# Patient Record
Sex: Female | Born: 1960 | Race: White | Hispanic: No | Marital: Married | Smoking: Never smoker
Health system: Southern US, Community
[De-identification: ages and names within clinical notes are randomized; demographics above are authoritative.]

## PROBLEM LIST (undated history)

## (undated) DIAGNOSIS — Z9889 Other specified postprocedural states: Secondary | ICD-10-CM

## (undated) DIAGNOSIS — J90 Pleural effusion, not elsewhere classified: Secondary | ICD-10-CM

## (undated) DIAGNOSIS — R918 Other nonspecific abnormal finding of lung field: Secondary | ICD-10-CM

## (undated) DIAGNOSIS — N10 Acute tubulo-interstitial nephritis: Secondary | ICD-10-CM

## (undated) DIAGNOSIS — E66811 Obesity, class 1: Secondary | ICD-10-CM

## (undated) DIAGNOSIS — I10 Essential (primary) hypertension: Secondary | ICD-10-CM

## (undated) DIAGNOSIS — E669 Obesity, unspecified: Secondary | ICD-10-CM

## (undated) DIAGNOSIS — M1990 Unspecified osteoarthritis, unspecified site: Secondary | ICD-10-CM

## (undated) DIAGNOSIS — E119 Type 2 diabetes mellitus without complications: Secondary | ICD-10-CM

## (undated) DIAGNOSIS — N151 Renal and perinephric abscess: Secondary | ICD-10-CM

## (undated) HISTORY — PX: ABDOMINAL HYSTERECTOMY: SHX81

## (undated) HISTORY — PX: TUBAL LIGATION: SHX77

---

## 2020-07-31 ENCOUNTER — Other Ambulatory Visit: Payer: Self-pay

## 2020-07-31 ENCOUNTER — Inpatient Hospital Stay (HOSPITAL_COMMUNITY)
Admission: EM | Admit: 2020-07-31 | Discharge: 2020-08-19 | DRG: 871 | Disposition: A | Discharge status: Home Health | Payer: Self-pay | Attending: Internal Medicine | Admitting: Internal Medicine

## 2020-07-31 ENCOUNTER — Encounter (HOSPITAL_COMMUNITY): Payer: Self-pay | Admitting: Emergency Medicine

## 2020-07-31 ENCOUNTER — Emergency Department (HOSPITAL_COMMUNITY): Payer: Self-pay

## 2020-07-31 DIAGNOSIS — E871 Hypo-osmolality and hyponatremia: Secondary | ICD-10-CM

## 2020-07-31 DIAGNOSIS — Z90710 Acquired absence of both cervix and uterus: Secondary | ICD-10-CM

## 2020-07-31 DIAGNOSIS — E860 Dehydration: Secondary | ICD-10-CM | POA: Diagnosis present

## 2020-07-31 DIAGNOSIS — I712 Thoracic aortic aneurysm, without rupture: Secondary | ICD-10-CM | POA: Diagnosis present

## 2020-07-31 DIAGNOSIS — R109 Unspecified abdominal pain: Secondary | ICD-10-CM

## 2020-07-31 DIAGNOSIS — R7881 Bacteremia: Secondary | ICD-10-CM

## 2020-07-31 DIAGNOSIS — J9600 Acute respiratory failure, unspecified whether with hypoxia or hypercapnia: Secondary | ICD-10-CM

## 2020-07-31 DIAGNOSIS — J188 Other pneumonia, unspecified organism: Secondary | ICD-10-CM

## 2020-07-31 DIAGNOSIS — E1110 Type 2 diabetes mellitus with ketoacidosis without coma: Secondary | ICD-10-CM

## 2020-07-31 DIAGNOSIS — A4151 Sepsis due to Escherichia coli [E. coli]: Principal | ICD-10-CM | POA: Diagnosis present

## 2020-07-31 DIAGNOSIS — J90 Pleural effusion, not elsewhere classified: Secondary | ICD-10-CM | POA: Diagnosis present

## 2020-07-31 DIAGNOSIS — R918 Other nonspecific abnormal finding of lung field: Secondary | ICD-10-CM

## 2020-07-31 DIAGNOSIS — D638 Anemia in other chronic diseases classified elsewhere: Secondary | ICD-10-CM | POA: Diagnosis present

## 2020-07-31 DIAGNOSIS — J189 Pneumonia, unspecified organism: Secondary | ICD-10-CM

## 2020-07-31 DIAGNOSIS — D72829 Elevated white blood cell count, unspecified: Secondary | ICD-10-CM | POA: Diagnosis present

## 2020-07-31 DIAGNOSIS — N151 Renal and perinephric abscess: Secondary | ICD-10-CM

## 2020-07-31 DIAGNOSIS — K567 Ileus, unspecified: Secondary | ICD-10-CM

## 2020-07-31 DIAGNOSIS — R140 Abdominal distension (gaseous): Secondary | ICD-10-CM

## 2020-07-31 DIAGNOSIS — K6819 Other retroperitoneal abscess: Secondary | ICD-10-CM

## 2020-07-31 DIAGNOSIS — N179 Acute kidney failure, unspecified: Secondary | ICD-10-CM

## 2020-07-31 DIAGNOSIS — E875 Hyperkalemia: Secondary | ICD-10-CM | POA: Diagnosis not present

## 2020-07-31 DIAGNOSIS — B9620 Unspecified Escherichia coli [E. coli] as the cause of diseases classified elsewhere: Secondary | ICD-10-CM

## 2020-07-31 DIAGNOSIS — I10 Essential (primary) hypertension: Secondary | ICD-10-CM | POA: Diagnosis present

## 2020-07-31 DIAGNOSIS — E872 Acidosis: Secondary | ICD-10-CM | POA: Diagnosis present

## 2020-07-31 DIAGNOSIS — N738 Other specified female pelvic inflammatory diseases: Secondary | ICD-10-CM | POA: Diagnosis present

## 2020-07-31 DIAGNOSIS — L02211 Cutaneous abscess of abdominal wall: Secondary | ICD-10-CM | POA: Diagnosis present

## 2020-07-31 DIAGNOSIS — R410 Disorientation, unspecified: Secondary | ICD-10-CM | POA: Diagnosis not present

## 2020-07-31 DIAGNOSIS — G9341 Metabolic encephalopathy: Secondary | ICD-10-CM | POA: Diagnosis present

## 2020-07-31 DIAGNOSIS — N136 Pyonephrosis: Secondary | ICD-10-CM | POA: Diagnosis present

## 2020-07-31 DIAGNOSIS — Z6832 Body mass index (BMI) 32.0-32.9, adult: Secondary | ICD-10-CM

## 2020-07-31 DIAGNOSIS — R6521 Severe sepsis with septic shock: Secondary | ICD-10-CM | POA: Diagnosis present

## 2020-07-31 DIAGNOSIS — R0989 Other specified symptoms and signs involving the circulatory and respiratory systems: Secondary | ICD-10-CM

## 2020-07-31 DIAGNOSIS — I714 Abdominal aortic aneurysm, without rupture: Secondary | ICD-10-CM | POA: Diagnosis present

## 2020-07-31 DIAGNOSIS — E1165 Type 2 diabetes mellitus with hyperglycemia: Secondary | ICD-10-CM

## 2020-07-31 DIAGNOSIS — E876 Hypokalemia: Secondary | ICD-10-CM | POA: Diagnosis present

## 2020-07-31 DIAGNOSIS — K529 Noninfective gastroenteritis and colitis, unspecified: Secondary | ICD-10-CM | POA: Diagnosis present

## 2020-07-31 DIAGNOSIS — E669 Obesity, unspecified: Secondary | ICD-10-CM | POA: Diagnosis present

## 2020-07-31 DIAGNOSIS — N170 Acute kidney failure with tubular necrosis: Secondary | ICD-10-CM | POA: Diagnosis present

## 2020-07-31 DIAGNOSIS — Z20822 Contact with and (suspected) exposure to covid-19: Secondary | ICD-10-CM | POA: Diagnosis present

## 2020-07-31 DIAGNOSIS — I76 Septic arterial embolism: Secondary | ICD-10-CM | POA: Diagnosis present

## 2020-07-31 DIAGNOSIS — E1100 Type 2 diabetes mellitus with hyperosmolarity without nonketotic hyperglycemic-hyperosmolar coma (NKHHC): Secondary | ICD-10-CM | POA: Diagnosis not present

## 2020-07-31 DIAGNOSIS — N10 Acute tubulo-interstitial nephritis: Secondary | ICD-10-CM

## 2020-07-31 DIAGNOSIS — N12 Tubulo-interstitial nephritis, not specified as acute or chronic: Secondary | ICD-10-CM

## 2020-07-31 DIAGNOSIS — A419 Sepsis, unspecified organism: Secondary | ICD-10-CM | POA: Diagnosis present

## 2020-07-31 DIAGNOSIS — E43 Unspecified severe protein-calorie malnutrition: Secondary | ICD-10-CM | POA: Diagnosis present

## 2020-07-31 HISTORY — DX: Obesity, class 1: E66.811

## 2020-07-31 HISTORY — DX: Obesity, unspecified: E66.9

## 2020-07-31 HISTORY — DX: Essential (primary) hypertension: I10

## 2020-07-31 HISTORY — DX: Pneumonia, unspecified organism: J18.9

## 2020-07-31 LAB — CBC WITH DIFFERENTIAL/PLATELET
Abs Immature Granulocytes: 2.41 10*3/uL — ABNORMAL HIGH (ref 0.00–0.07)
Basophils Absolute: 0 10*3/uL (ref 0.0–0.1)
Basophils Relative: 0 %
Eosinophils Absolute: 0 10*3/uL (ref 0.0–0.5)
Eosinophils Relative: 0 %
HCT: 34.3 % — ABNORMAL LOW (ref 36.0–46.0)
Hemoglobin: 12.6 g/dL (ref 12.0–15.0)
Immature Granulocytes: 6 %
Lymphocytes Relative: 2 %
Lymphs Abs: 0.9 10*3/uL (ref 0.7–4.0)
MCH: 30.5 pg (ref 26.0–34.0)
MCHC: 36.7 g/dL — ABNORMAL HIGH (ref 30.0–36.0)
MCV: 83.1 fL (ref 80.0–100.0)
Monocytes Absolute: 0.5 10*3/uL (ref 0.1–1.0)
Monocytes Relative: 1 %
Neutro Abs: 40.1 10*3/uL — ABNORMAL HIGH (ref 1.7–7.7)
Neutrophils Relative %: 91 %
Platelets: 225 10*3/uL (ref 150–400)
RBC: 4.13 MIL/uL (ref 3.87–5.11)
RDW: 12.9 % (ref 11.5–15.5)
WBC Morphology: INCREASED
WBC: 44 10*3/uL — ABNORMAL HIGH (ref 4.0–10.5)
nRBC: 0 % (ref 0.0–0.2)

## 2020-07-31 LAB — COMPREHENSIVE METABOLIC PANEL
ALT: 16 U/L (ref 0–44)
AST: 14 U/L — ABNORMAL LOW (ref 15–41)
Albumin: 2 g/dL — ABNORMAL LOW (ref 3.5–5.0)
Alkaline Phosphatase: 147 U/L — ABNORMAL HIGH (ref 38–126)
BUN: 112 mg/dL — ABNORMAL HIGH (ref 6–20)
CO2: 14 mmol/L — ABNORMAL LOW (ref 22–32)
Calcium: 7.2 mg/dL — ABNORMAL LOW (ref 8.9–10.3)
Chloride: <65 mmol/L — CL (ref 98–111)
Creatinine, Ser: 4.19 mg/dL — ABNORMAL HIGH (ref 0.44–1.00)
GFR, Estimated: 12 mL/min — ABNORMAL LOW (ref >60)
Glucose, Bld: 706 mg/dL (ref 70–99)
Potassium: 3.3 mmol/L — ABNORMAL LOW (ref 3.5–5.1)
Sodium: <102 mmol/L — CL (ref 135–145)
Total Bilirubin: 1.3 mg/dL — ABNORMAL HIGH (ref 0.3–1.2)
Total Protein: 5.4 g/dL — ABNORMAL LOW (ref 6.5–8.1)

## 2020-07-31 LAB — BASIC METABOLIC PANEL
BUN: 106 mg/dL — ABNORMAL HIGH (ref 6–20)
CO2: 12 mmol/L — ABNORMAL LOW (ref 22–32)
Calcium: 7.2 mg/dL — ABNORMAL LOW (ref 8.9–10.3)
Chloride: 69 mmol/L — ABNORMAL LOW (ref 98–111)
Creatinine, Ser: 3.98 mg/dL — ABNORMAL HIGH (ref 0.44–1.00)
GFR, Estimated: 12 mL/min — ABNORMAL LOW (ref >60)
Glucose, Bld: 624 mg/dL (ref 70–99)
Potassium: 3.1 mmol/L — ABNORMAL LOW (ref 3.5–5.1)
Sodium: <102 mmol/L — CL (ref 135–145)

## 2020-07-31 LAB — RESP PANEL BY RT-PCR (FLU A&B, COVID) ARPGX2
Influenza A by PCR: NEGATIVE
Influenza B by PCR: NEGATIVE
SARS Coronavirus 2 by RT PCR: NEGATIVE

## 2020-07-31 LAB — LACTIC ACID, PLASMA
Lactic Acid, Venous: 2 mmol/L (ref 0.5–1.9)
Lactic Acid, Venous: 1.9 mmol/L (ref 0.5–1.9)

## 2020-07-31 LAB — AMMONIA: Ammonia: 32 umol/L (ref 9–35)

## 2020-07-31 LAB — CBG MONITORING, ED
Glucose-Capillary: >600 mg/dL (ref 70–99)
Glucose-Capillary: >600 mg/dL (ref 70–99)
Glucose-Capillary: >600 mg/dL (ref 70–99)
Glucose-Capillary: 560 mg/dL (ref 70–99)

## 2020-07-31 LAB — LIPASE, BLOOD: Lipase: 51 U/L (ref 11–51)

## 2020-07-31 LAB — BETA-HYDROXYBUTYRIC ACID: Beta-Hydroxybutyric Acid: 0.38 mmol/L — ABNORMAL HIGH (ref 0.05–0.27)

## 2020-07-31 MED ORDER — ONDANSETRON HCL 4 MG PO TABS: 4.0000 mg | ORAL_TABLET | Freq: Four times a day (QID) | ORAL | Status: DC | PRN

## 2020-07-31 MED ORDER — DEXTROSE IN LACTATED RINGERS 5 % IV SOLN: INTRAVENOUS | Status: DC

## 2020-07-31 MED ORDER — POTASSIUM CHLORIDE IN NACL 40-0.9 MEQ/L-% IV SOLN
INTRAVENOUS | Status: DC
  Filled 2020-07-31 (×2): qty 1000

## 2020-07-31 MED ORDER — LACTATED RINGERS IV BOLUS (SEPSIS)
500.0000 mL | Freq: Once | INTRAVENOUS | Status: AC
  Administered 2020-07-31: 21:00:00 500 mL via INTRAVENOUS

## 2020-07-31 MED ORDER — LACTATED RINGERS IV BOLUS: 1000.0000 mL | Freq: Once | INTRAVENOUS | Status: DC

## 2020-07-31 MED ORDER — LACTATED RINGERS IV BOLUS (SEPSIS): 1000.0000 mL | Freq: Once | INTRAVENOUS | Status: DC

## 2020-07-31 MED ORDER — POTASSIUM CHLORIDE 10 MEQ/100ML IV SOLN: 10.0000 meq | INTRAVENOUS | Status: DC

## 2020-07-31 MED ORDER — INSULIN REGULAR(HUMAN) IN NACL 100-0.9 UT/100ML-% IV SOLN
INTRAVENOUS | Status: DC
  Administered 2020-07-31: 22:00:00 10 [IU]/h via INTRAVENOUS
  Filled 2020-07-31: qty 100

## 2020-07-31 MED ORDER — ALBUTEROL SULFATE (2.5 MG/3ML) 0.083% IN NEBU: 2.5000 mg | INHALATION_SOLUTION | RESPIRATORY_TRACT | Status: DC | PRN

## 2020-07-31 MED ORDER — ACETAMINOPHEN 650 MG RE SUPP: 650.0000 mg | Freq: Four times a day (QID) | RECTAL | Status: DC | PRN

## 2020-07-31 MED ORDER — SODIUM CHLORIDE 0.9 % IV BOLUS: 20.0000 mL/kg | Freq: Once | INTRAVENOUS | Status: DC

## 2020-07-31 MED ORDER — DEXTROSE 50 % IV SOLN: 0.0000 mL | INTRAVENOUS | Status: DC | PRN

## 2020-07-31 MED ORDER — LACTATED RINGERS IV SOLN
INTRAVENOUS | Status: DC
Start: 2020-07-31 — End: 2020-07-31

## 2020-07-31 MED ORDER — LACTATED RINGERS IV BOLUS (SEPSIS)
1000.0000 mL | Freq: Once | INTRAVENOUS | Status: AC
  Administered 2020-07-31: 20:00:00 1000 mL via INTRAVENOUS

## 2020-07-31 MED ORDER — ONDANSETRON HCL 4 MG/2ML IJ SOLN: 4.0000 mg | Freq: Four times a day (QID) | INTRAMUSCULAR | Status: DC | PRN

## 2020-07-31 MED ORDER — SODIUM CHLORIDE 0.9 % IV SOLN
500.0000 mg | INTRAVENOUS | Status: DC
  Administered 2020-07-31: 21:00:00 500 mg via INTRAVENOUS
  Filled 2020-07-31: qty 500

## 2020-07-31 MED ORDER — SODIUM CHLORIDE 0.9 % IV SOLN
1000.0000 mL | Freq: Once | INTRAVENOUS | Status: AC
  Administered 2020-07-31: 18:00:00 1000 mL via INTRAVENOUS

## 2020-07-31 MED ORDER — SODIUM CHLORIDE 0.9 % IV SOLN
2.0000 g | INTRAVENOUS | Status: DC
  Administered 2020-07-31: 20:00:00 2 g via INTRAVENOUS
  Filled 2020-07-31: qty 20

## 2020-07-31 MED ORDER — MORPHINE SULFATE (PF) 4 MG/ML IV SOLN
4.0000 mg | Freq: Once | INTRAVENOUS | Status: AC
  Administered 2020-07-31: 18:00:00 4 mg via INTRAVENOUS
  Filled 2020-07-31: qty 1

## 2020-07-31 MED ORDER — ENOXAPARIN SODIUM 30 MG/0.3ML ~~LOC~~ SOLN
30.0000 mg | SUBCUTANEOUS | Status: DC
  Administered 2020-08-01 – 2020-08-03 (×3): 30 mg via SUBCUTANEOUS
  Filled 2020-07-31 (×3): qty 0.3

## 2020-07-31 MED ORDER — SODIUM CHLORIDE 0.9 % IV SOLN: INTRAVENOUS | Status: DC

## 2020-07-31 MED ORDER — ONDANSETRON HCL 4 MG/2ML IJ SOLN
4.0000 mg | Freq: Once | INTRAMUSCULAR | Status: AC
  Administered 2020-07-31: 18:00:00 4 mg via INTRAVENOUS
  Filled 2020-07-31: qty 2

## 2020-07-31 MED ORDER — ACETAMINOPHEN 325 MG PO TABS: 650.0000 mg | ORAL_TABLET | Freq: Four times a day (QID) | ORAL | Status: DC | PRN

## 2020-07-31 MED ORDER — ENOXAPARIN SODIUM 30 MG/0.3ML ~~LOC~~ SOLN: 30.0000 mg | SUBCUTANEOUS | Status: DC

## 2020-07-31 MED ORDER — INSULIN ASPART 100 UNIT/ML ~~LOC~~ SOLN
5.0000 [IU] | Freq: Once | SUBCUTANEOUS | Status: AC
  Administered 2020-07-31: 22:00:00 5 [IU] via SUBCUTANEOUS
  Filled 2020-07-31: qty 1

## 2020-07-31 NOTE — Sepsis Progress Note (Signed)
Following for code sepsis monitoring 

## 2020-07-31 NOTE — ED Provider Notes (Signed)
[facility] EMERGENCY DEPARTMENT Provider Note   CSN: [id number] Arrival date & time: 07/31/20  1636     History Chief Complaint  Patient presents with  . Abdominal Pain    Tammie [name] is a 59 y.o. female.  The history is provided by the patient and the spouse. No language interpreter was used.  Abdominal Pain    59 year old female significant history of hypertension accompanied by her husband to the ED for evaluation of abdominal pain.  History is mostly obtained through husband who is at bedside.  Per husband, patient have not been feeling well for the past week however for the past 2 to 3 days she has been complaining of abdominal pain, along with nausea.  He also noticed that she is becoming more confused than her baseline within the past few days not able to answer question appropriately.  This is new for her.  She has not been vaccinated for Covid. She has been coughing but no congestion and no SOB.   She haven't been eating much.  No vomiting or diarrhea.  No report of burning urination but she did endorse increased urinary frequency.  No recent medication changes.  No significant history of tobacco or alcohol abuse.  No recent sick contact.  Past Medical History:  Diagnosis Date  . Hypertension     There are no problems to display for this patient.   Past Surgical History:  Procedure Laterality Date  . ABDOMINAL HYSTERECTOMY       OB History   No obstetric history on file.     History reviewed. No pertinent family history.  Social History   Tobacco Use  . Smoking status: Never Smoker  . Smokeless tobacco: Never Used  Vaping Use  . Vaping Use: Never used  Substance Use Topics  . Alcohol use: Not Currently  . Drug use: Not Currently    Home Medications Prior to Admission medications   Not on File    Allergies    Codeine  Review of Systems   Review of Systems  Unable to perform ROS: Mental status change  Gastrointestinal: Positive for abdominal  pain.    Physical Exam Updated Vital Signs BP 133/70 (BP Location: Right Arm)   Pulse 88   Temp 97.9 F (36.6 C) (Oral)   Resp 18   Ht 5\' 2"  (1.575 m)   Wt 79.4 kg   SpO2 98%   BMI 32.01 kg/m   Physical Exam Vitals and nursing note reviewed.  Constitutional:      Appearance: She is well-developed and well-nourished.     Comments: Ill-appearing female appears altered.  HENT:     Head: Atraumatic.     Mouth/Throat:     Comments: Mouth is dry Eyes:     Conjunctiva/sclera: Conjunctivae normal.  Cardiovascular:     Rate and Rhythm: Normal rate and regular rhythm.     Heart sounds: Normal heart sounds.  Pulmonary:     Effort: Pulmonary effort is normal.     Breath sounds: Normal breath sounds. No wheezing, rhonchi or rales.  Abdominal:     General: There is distension.     Tenderness: There is generalized abdominal tenderness.     Hernia: No hernia is present.  Musculoskeletal:     Cervical back: Neck supple.  Skin:    Findings: No rash.     Comments: Mottled skin appearance to her lower extremities bilaterally and the skin is cool to the touch.  Neurological:  Mental Status: She is alert. She is disoriented.     GCS: GCS eye subscore is 4. GCS verbal subscore is 4. GCS motor subscore is 6.     Cranial Nerves: Cranial nerves are intact.     Comments: No asterixis  Psychiatric:        Mood and Affect: Mood and affect normal.     ED Results / Procedures / Treatments   Labs (all labs ordered are listed, but only abnormal results are displayed) Labs Reviewed  CBC WITH DIFFERENTIAL/PLATELET - Abnormal; Notable for the following components:      Result Value   WBC 44.0 (*)    HCT 34.3 (*)    MCHC 36.7 (*)    Neutro Abs 40.1 (*)    Abs Immature Granulocytes 2.41 (*)    All other components within normal limits  LACTIC ACID, PLASMA - Abnormal; Notable for the following components:   Lactic Acid, Venous 2.0 (*)    All other components within normal limits   COMPREHENSIVE METABOLIC PANEL - Abnormal; Notable for the following components:   Sodium <102 (*)    Potassium 3.3 (*)    Chloride <65 (*)    CO2 14 (*)    Glucose, Bld 706 (*)    BUN 112 (*)    Creatinine, Ser 4.19 (*)    Calcium 7.2 (*)    Total Protein 5.4 (*)    Albumin 2.0 (*)    AST 14 (*)    Alkaline Phosphatase 147 (*)    Total Bilirubin 1.3 (*)    GFR, Estimated 12 (*)    All other components within normal limits  CBG MONITORING, ED - Abnormal; Notable for the following components:   Glucose-Capillary >600 (*)    All other components within normal limits  CULTURE, BLOOD (SINGLE)  RESP PANEL BY RT-PCR (FLU A&B, COVID) ARPGX2  CULTURE, BLOOD (SINGLE)  AMMONIA  LACTIC ACID, PLASMA  LIPASE, BLOOD  URINALYSIS, ROUTINE W REFLEX MICROSCOPIC  BETA-HYDROXYBUTYRIC ACID  BASIC METABOLIC PANEL  BASIC METABOLIC PANEL  BASIC METABOLIC PANEL  BASIC METABOLIC PANEL  OSMOLALITY  POC URINE PREG, ED  CBG MONITORING, ED    EKG None  Radiology DG Chest Portable 1 View  Result Date: 07/31/2020 CLINICAL DATA:  Delirium EXAM: PORTABLE CHEST 1 VIEW COMPARISON:  None. FINDINGS: The heart size and mediastinal contours are within normal limits. Elevation of left hemidiaphragm Bilateral patchy airspace opacities. No pulmonary edema. No definite pleural effusion. No pneumothorax. No acute osseous abnormality. IMPRESSION: Multifocal airspace opacities that could represent infection or inflammation. COVID-19 infection not excluded. Followup PA and lateral chest X-ray is recommended in 3-4 weeks following therapy to ensure resolution and exclude underlying malignancy. Electronically Signed   By: Tish Frederickson M.D.   On: 07/31/2020 17:46    Procedures .Critical Care Performed by: Fayrene Helper, PA-C Authorized by: Fayrene Helper, PA-C   Critical care provider statement:    Critical care time (minutes):  60   Critical care was time spent personally by me on the following activities:   Discussions with consultants, evaluation of patient's response to treatment, examination of patient, ordering and performing treatments and interventions, ordering and review of laboratory studies, ordering and review of radiographic studies, pulse oximetry, re-evaluation of patient's condition, obtaining history from patient or surrogate and review of old charts   (including critical care time)  Medications Ordered in ED Medications  lactated ringers infusion ( Intravenous New Bag/Given 07/31/20 2028)  cefTRIAXone (ROCEPHIN) 2 g in  sodium chloride 0.9 % 100 mL IVPB (0 g Intravenous Stopped 07/31/20 2038)  azithromycin (ZITHROMAX) 500 mg in sodium chloride 0.9 % 250 mL IVPB (500 mg Intravenous New Bag/Given 07/31/20 2049)  insulin aspart (novoLOG) injection 5 Units (has no administration in time range)  insulin regular, human (MYXREDLIN) 100 units/ 100 mL infusion (has no administration in time range)  dextrose 5 % in lactated ringers infusion (has no administration in time range)  dextrose 50 % solution 0-50 mL (has no administration in time range)  sodium chloride 0.9 % bolus 1,588 mL (has no administration in time range)  potassium chloride 10 mEq in 100 mL IVPB (has no administration in time range)  0.9 %  sodium chloride infusion (has no administration in time range)  0.9 % NaCl with KCl 40 mEq / L  infusion (has no administration in time range)  0.9 %  sodium chloride infusion (1,000 mLs Intravenous New Bag/Given 07/31/20 1802)  morphine 4 MG/ML injection 4 mg (4 mg Intravenous Given 07/31/20 1803)  ondansetron (ZOFRAN) injection 4 mg (4 mg Intravenous Given 07/31/20 1803)  lactated ringers bolus 1,000 mL (1,000 mLs Intravenous New Bag/Given 07/31/20 2029)    And  lactated ringers bolus 500 mL (500 mLs Intravenous New Bag/Given 07/31/20 2030)    ED Course  I have reviewed the triage vital signs and the nursing notes.  Pertinent labs & imaging results that were available during my  care of the patient were reviewed by me and considered in my medical decision making (see chart for details).    MDM Rules/Calculators/A&P                          BP (!) 133/100   Pulse 97   Temp 98.2 F (36.8 C) (Rectal)   Resp 18   Ht 5\' 2"  (1.575 m)   Wt 79.4 kg   SpO2 99%   BMI 32.01 kg/m   Final Clinical Impression(s) / ED Diagnoses Final diagnoses:  Multifocal pneumonia  Hyperosmolar hyperglycemic state (HHS) (HCC)  Hyponatremia  Acute renal failure, unspecified acute renal failure type (HCC)  Delirium    Rx / DC Orders ED Discharge Orders    None     5:30 PM Patient brought here for generalized weakness, altered mental status as well as abdominal pain.  She is having difficulty answering simple questions and appears to be confused.  She has a distended abdomen that is tender to palpation.  She is afebrile vital signs stable no hypoxia.  Work-up initiated.`  7:47 PM Chest x-ray shows multifocal airspace opacity that could represent infection or inflammation.  COVID-19 infection is not excluded.  Her Covid test did came back negative.  Patient however has an elevated white count of 44 with mostly absolute neutrophil as well as an elevated lactic acid of 2.0.  Normal ammonia level.  Code sepsis initiated, will initiate antibiotic which includes Rocephin and Zithromax.  Will consult for admission.  Abdominal pelvis CT scan is currently pending as well as a CMP.  8:45 PM CMP came back remarkably abnormal with a sodium of less than 102, potassium of 3.3, chloride less than 65, bicarb of 14, CBG of 706, BUN 112, creatinine 4.19, calcium of 7.2, and a GFR of 12.  Total bili is 1.3.  These labs are remarkably abnormal.  Patient without history of diabetes.  Is currently receiving fluid resuscitation at 30 mL/kg.  Patient was given antibiotics, will consult for admission.  Care discussed with Dr. Estell Harpin.   9:19 PM Attempted ultrasound IV access without success.  Initially  patient was giving lactic ringer IV fluid but with hyponatremia, will switch over to normal saline instead.  Patient exhibits hyperglycemia concerning for HHS with associated delirium.  Glucose stabilizer protocol initiated.  Aggressive fluid resuscitation.  Sepsis reassessment.  [facility] hospitalist, Dr. Robb Matar was consulted and will admit patient for further care.  Tammie [name] was evaluated in Emergency Department on 07/31/2020 for the symptoms described in the history of present illness. She was evaluated in the context of the global COVID-19 pandemic, which necessitated consideration that the patient might be at risk for infection with the SARS-CoV-2 virus that causes COVID-19. Institutional protocols and algorithms that pertain to the evaluation of patients at risk for COVID-19 are in a state of rapid change based on information released by regulatory bodies including the CDC and federal and state organizations. These policies and algorithms were followed during the patient's care in the ED.    Fayrene Helper, PA-C 07/31/20 2138    Bethann Berkshire, MD 08/01/20 [phone]

## 2020-07-31 NOTE — ED Notes (Signed)
Date and time results received: 12/23/212042 (use smartphrase ".now" to insert current time)  Test: Sodium Critical Value: <102  Test: Chloride Critical Value: <65  Test: Glucose Critical Value: 706  Name of Provider Notified: Estell Harpin, MD  Orders Received? Or Actions Taken?:

## 2020-07-31 NOTE — ED Notes (Signed)
Date and time results received: 07/31/20 2249  Test: Na Critical Value: <102  Test: BGL Critical Value: 624  Name of Provider Notified: Dr. Robb Matar  Orders Received? Or Actions Taken?:

## 2020-07-31 NOTE — Sepsis Progress Note (Signed)
Will close sepsis monitoring. Bundle complete. Follow as ICU patient.

## 2020-07-31 NOTE — ED Notes (Addendum)
Called AC for NS w/ KCL 

## 2020-07-31 NOTE — ED Triage Notes (Signed)
Pt c/o abdominal pain since Wednesday. Pt seems somewhat altered and is slow to answer questions.  She is accompanied by her husband for history.

## 2020-08-01 ENCOUNTER — Inpatient Hospital Stay (HOSPITAL_COMMUNITY): Payer: Self-pay

## 2020-08-01 ENCOUNTER — Encounter (HOSPITAL_COMMUNITY): Payer: Self-pay | Admitting: Internal Medicine

## 2020-08-01 DIAGNOSIS — R140 Abdominal distension (gaseous): Secondary | ICD-10-CM

## 2020-08-01 DIAGNOSIS — E43 Unspecified severe protein-calorie malnutrition: Secondary | ICD-10-CM | POA: Diagnosis present

## 2020-08-01 DIAGNOSIS — E876 Hypokalemia: Secondary | ICD-10-CM | POA: Diagnosis present

## 2020-08-01 DIAGNOSIS — I10 Essential (primary) hypertension: Secondary | ICD-10-CM | POA: Diagnosis present

## 2020-08-01 DIAGNOSIS — N151 Renal and perinephric abscess: Secondary | ICD-10-CM | POA: Diagnosis not present

## 2020-08-01 DIAGNOSIS — E871 Hypo-osmolality and hyponatremia: Secondary | ICD-10-CM | POA: Diagnosis present

## 2020-08-01 DIAGNOSIS — J439 Emphysema, unspecified: Secondary | ICD-10-CM | POA: Diagnosis not present

## 2020-08-01 DIAGNOSIS — N132 Hydronephrosis with renal and ureteral calculous obstruction: Secondary | ICD-10-CM | POA: Diagnosis not present

## 2020-08-01 DIAGNOSIS — I712 Thoracic aortic aneurysm, without rupture: Secondary | ICD-10-CM | POA: Diagnosis not present

## 2020-08-01 DIAGNOSIS — E66811 Obesity, class 1: Secondary | ICD-10-CM | POA: Diagnosis present

## 2020-08-01 DIAGNOSIS — R410 Disorientation, unspecified: Secondary | ICD-10-CM | POA: Diagnosis not present

## 2020-08-01 DIAGNOSIS — E669 Obesity, unspecified: Secondary | ICD-10-CM | POA: Diagnosis present

## 2020-08-01 DIAGNOSIS — R918 Other nonspecific abnormal finding of lung field: Secondary | ICD-10-CM

## 2020-08-01 DIAGNOSIS — E1100 Type 2 diabetes mellitus with hyperosmolarity without nonketotic hyperglycemic-hyperosmolar coma (NKHHC): Secondary | ICD-10-CM | POA: Diagnosis present

## 2020-08-01 DIAGNOSIS — K651 Peritoneal abscess: Secondary | ICD-10-CM | POA: Diagnosis not present

## 2020-08-01 DIAGNOSIS — N10 Acute tubulo-interstitial nephritis: Secondary | ICD-10-CM | POA: Diagnosis not present

## 2020-08-01 DIAGNOSIS — N134 Hydroureter: Secondary | ICD-10-CM | POA: Diagnosis not present

## 2020-08-01 DIAGNOSIS — D72829 Elevated white blood cell count, unspecified: Secondary | ICD-10-CM | POA: Diagnosis present

## 2020-08-01 DIAGNOSIS — N179 Acute kidney failure, unspecified: Secondary | ICD-10-CM

## 2020-08-01 LAB — CBC WITH DIFFERENTIAL/PLATELET
Band Neutrophils: 20 %
Basophils Absolute: 0 10*3/uL (ref 0.0–0.1)
Basophils Relative: 0 %
Eosinophils Absolute: 0 10*3/uL (ref 0.0–0.5)
Eosinophils Relative: 0 %
HCT: 30.9 % — ABNORMAL LOW (ref 36.0–46.0)
Hemoglobin: 11.4 g/dL — ABNORMAL LOW (ref 12.0–15.0)
Lymphocytes Relative: 2 %
Lymphs Abs: 1 10*3/uL (ref 0.7–4.0)
MCH: 30.3 pg (ref 26.0–34.0)
MCHC: 36.9 g/dL — ABNORMAL HIGH (ref 30.0–36.0)
MCV: 82.2 fL (ref 80.0–100.0)
Metamyelocytes Relative: 2 %
Monocytes Absolute: 1 10*3/uL (ref 0.1–1.0)
Monocytes Relative: 2 %
Neutro Abs: 46.2 10*3/uL — ABNORMAL HIGH (ref 1.7–7.7)
Neutrophils Relative %: 74 %
Platelets: 206 10*3/uL (ref 150–400)
RBC: 3.76 MIL/uL — ABNORMAL LOW (ref 3.87–5.11)
RDW: 12.9 % (ref 11.5–15.5)
WBC Morphology: INCREASED
WBC: 49.1 10*3/uL — ABNORMAL HIGH (ref 4.0–10.5)
nRBC: 0 % (ref 0.0–0.2)

## 2020-08-01 LAB — BLOOD GAS, VENOUS
Acid-base deficit: 10.9 mmol/L — ABNORMAL HIGH (ref 0.0–2.0)
Bicarbonate: 16.7 mmol/L — ABNORMAL LOW (ref 20.0–28.0)
Drawn by: 1528
FIO2: 98
O2 Saturation: 95 %
Patient temperature: 37
pCO2, Ven: 20.5 mmHg — ABNORMAL LOW (ref 44.0–60.0)
pH, Ven: 7.414 (ref 7.250–7.430)
pO2, Ven: 72.5 mmHg — ABNORMAL HIGH (ref 32.0–45.0)

## 2020-08-01 LAB — GLUCOSE, CAPILLARY
Glucose-Capillary: 124 mg/dL — ABNORMAL HIGH (ref 70–99)
Glucose-Capillary: 125 mg/dL — ABNORMAL HIGH (ref 70–99)
Glucose-Capillary: 153 mg/dL — ABNORMAL HIGH (ref 70–99)
Glucose-Capillary: 179 mg/dL — ABNORMAL HIGH (ref 70–99)
Glucose-Capillary: 185 mg/dL — ABNORMAL HIGH (ref 70–99)
Glucose-Capillary: 192 mg/dL — ABNORMAL HIGH (ref 70–99)
Glucose-Capillary: 204 mg/dL — ABNORMAL HIGH (ref 70–99)
Glucose-Capillary: 204 mg/dL — ABNORMAL HIGH (ref 70–99)
Glucose-Capillary: 236 mg/dL — ABNORMAL HIGH (ref 70–99)
Glucose-Capillary: 238 mg/dL — ABNORMAL HIGH (ref 70–99)
Glucose-Capillary: 244 mg/dL — ABNORMAL HIGH (ref 70–99)
Glucose-Capillary: 334 mg/dL — ABNORMAL HIGH (ref 70–99)

## 2020-08-01 LAB — BLOOD CULTURE ID PANEL (REFLEXED) - BCID2

## 2020-08-01 LAB — URINALYSIS, ROUTINE W REFLEX MICROSCOPIC
Bilirubin Urine: NEGATIVE
Glucose, UA: 50 mg/dL — AB
Ketones, ur: NEGATIVE mg/dL
Nitrite: NEGATIVE
Protein, ur: 30 mg/dL — AB
Specific Gravity, Urine: 1.011 (ref 1.005–1.030)
pH: 5 (ref 5.0–8.0)

## 2020-08-01 LAB — BASIC METABOLIC PANEL
Anion gap: 19 — ABNORMAL HIGH (ref 5–15)
Anion gap: 15 (ref 5–15)
BUN: 108 mg/dL — ABNORMAL HIGH (ref 6–20)
BUN: 108 mg/dL — ABNORMAL HIGH (ref 6–20)
CO2: 12 mmol/L — ABNORMAL LOW (ref 22–32)
CO2: 13 mmol/L — ABNORMAL LOW (ref 22–32)
Calcium: 7.6 mg/dL — ABNORMAL LOW (ref 8.9–10.3)
Calcium: 7.8 mg/dL — ABNORMAL LOW (ref 8.9–10.3)
Chloride: 74 mmol/L — ABNORMAL LOW (ref 98–111)
Chloride: 75 mmol/L — ABNORMAL LOW (ref 98–111)
Creatinine, Ser: 4.13 mg/dL — ABNORMAL HIGH (ref 0.44–1.00)
Creatinine, Ser: 3.79 mg/dL — ABNORMAL HIGH (ref 0.44–1.00)
GFR, Estimated: 12 mL/min — ABNORMAL LOW (ref >60)
GFR, Estimated: 13 mL/min — ABNORMAL LOW (ref >60)
Glucose, Bld: 227 mg/dL — ABNORMAL HIGH (ref 70–99)
Glucose, Bld: 151 mg/dL — ABNORMAL HIGH (ref 70–99)
Potassium: 2.9 mmol/L — ABNORMAL LOW (ref 3.5–5.1)
Potassium: 3.6 mmol/L (ref 3.5–5.1)
Sodium: 105 mmol/L — CL (ref 135–145)
Sodium: 103 mmol/L — CL (ref 135–145)

## 2020-08-01 LAB — COMPREHENSIVE METABOLIC PANEL
ALT: 19 U/L (ref 0–44)
AST: 19 U/L (ref 15–41)
Albumin: 2.1 g/dL — ABNORMAL LOW (ref 3.5–5.0)
Alkaline Phosphatase: 142 U/L — ABNORMAL HIGH (ref 38–126)
Anion gap: 15 (ref 5–15)
BUN: 114 mg/dL — ABNORMAL HIGH (ref 6–20)
CO2: 14 mmol/L — ABNORMAL LOW (ref 22–32)
Calcium: 7.7 mg/dL — ABNORMAL LOW (ref 8.9–10.3)
Chloride: 75 mmol/L — ABNORMAL LOW (ref 98–111)
Creatinine, Ser: 3.82 mg/dL — ABNORMAL HIGH (ref 0.44–1.00)
GFR, Estimated: 13 mL/min — ABNORMAL LOW (ref >60)
Glucose, Bld: 205 mg/dL — ABNORMAL HIGH (ref 70–99)
Potassium: 3.1 mmol/L — ABNORMAL LOW (ref 3.5–5.1)
Sodium: 104 mmol/L — CL (ref 135–145)
Total Bilirubin: 0.9 mg/dL (ref 0.3–1.2)
Total Protein: 5.4 g/dL — ABNORMAL LOW (ref 6.5–8.1)

## 2020-08-01 LAB — PROCALCITONIN: Procalcitonin: >150 ng/mL

## 2020-08-01 LAB — SODIUM, URINE, RANDOM: Sodium, Ur: 12 mmol/L

## 2020-08-01 LAB — SODIUM
Sodium: <102 mmol/L — CL (ref 135–145)
Sodium: 101 mmol/L — CL (ref 135–145)
Sodium: 106 mmol/L — CL (ref 135–145)
Sodium: 110 mmol/L — CL (ref 135–145)

## 2020-08-01 LAB — OSMOLALITY
Osmolality: 291 mOsm/kg (ref 275–295)
Osmolality: 264 mOsm/kg — ABNORMAL LOW (ref 275–295)

## 2020-08-01 LAB — TSH: TSH: 0.92 u[IU]/mL (ref 0.350–4.500)

## 2020-08-01 LAB — CORTISOL: Cortisol, Plasma: 67.7 ug/dL

## 2020-08-01 LAB — HIV ANTIBODY (ROUTINE TESTING W REFLEX): HIV Screen 4th Generation wRfx: NONREACTIVE

## 2020-08-01 LAB — OSMOLALITY, URINE: Osmolality, Ur: 279 mOsm/kg — ABNORMAL LOW (ref 300–900)

## 2020-08-01 LAB — MAGNESIUM: Magnesium: 1.6 mg/dL — ABNORMAL LOW (ref 1.7–2.4)

## 2020-08-01 LAB — MRSA PCR SCREENING: MRSA by PCR: NEGATIVE

## 2020-08-01 LAB — STREP PNEUMONIAE URINARY ANTIGEN: Strep Pneumo Urinary Antigen: NEGATIVE

## 2020-08-01 LAB — HEMOGLOBIN A1C
Hgb A1c MFr Bld: 12.2 % — ABNORMAL HIGH (ref 4.8–5.6)
Mean Plasma Glucose: 303.44 mg/dL

## 2020-08-01 LAB — PHOSPHORUS: Phosphorus: 6.2 mg/dL — ABNORMAL HIGH (ref 2.5–4.6)

## 2020-08-01 MED ORDER — NALOXONE HCL 0.4 MG/ML IJ SOLN
INTRAMUSCULAR | Status: AC
  Filled 2020-08-01: qty 1

## 2020-08-01 MED ORDER — PIPERACILLIN-TAZOBACTAM 3.375 G IVPB: 3.3750 g | Freq: Two times a day (BID) | INTRAVENOUS | Status: DC

## 2020-08-01 MED ORDER — INSULIN GLARGINE 100 UNIT/ML ~~LOC~~ SOLN
30.0000 [IU] | Freq: Two times a day (BID) | SUBCUTANEOUS | Status: DC
  Filled 2020-08-01: qty 0.3

## 2020-08-01 MED ORDER — SODIUM CHLORIDE 0.9 % IV SOLN
2.0000 g | INTRAVENOUS | Status: DC
  Administered 2020-08-01 – 2020-08-03 (×3): 2 g via INTRAVENOUS
  Filled 2020-08-01: qty 20
  Filled 2020-08-01: qty 2
  Filled 2020-08-01: qty 20

## 2020-08-01 MED ORDER — POTASSIUM CHLORIDE 10 MEQ/100ML IV SOLN
10.0000 meq | INTRAVENOUS | Status: AC
  Administered 2020-08-01 (×4): 10 meq via INTRAVENOUS
  Filled 2020-08-01 (×4): qty 100

## 2020-08-01 MED ORDER — CHLORHEXIDINE GLUCONATE CLOTH 2 % EX PADS
6.0000 | MEDICATED_PAD | Freq: Every day | CUTANEOUS | Status: DC
  Administered 2020-08-01 – 2020-08-18 (×15): 6 via TOPICAL

## 2020-08-01 MED ORDER — MIDAZOLAM HCL 2 MG/2ML IJ SOLN
INTRAMUSCULAR | Status: AC
  Filled 2020-08-01: qty 2

## 2020-08-01 MED ORDER — PIPERACILLIN-TAZOBACTAM 3.375 G IVPB
3.3750 g | Freq: Once | INTRAVENOUS | Status: AC
  Administered 2020-08-01: 15:00:00 3.375 g via INTRAVENOUS
  Filled 2020-08-01: qty 50

## 2020-08-01 MED ORDER — CHLORHEXIDINE GLUCONATE 0.12 % MT SOLN
15.0000 mL | Freq: Two times a day (BID) | OROMUCOSAL | Status: DC
  Administered 2020-08-01 – 2020-08-09 (×10): 15 mL via OROMUCOSAL
  Filled 2020-08-01 (×11): qty 15

## 2020-08-01 MED ORDER — INSULIN STARTER KIT- PEN NEEDLES (ENGLISH)
1.0000 | Freq: Once | Status: AC
  Administered 2020-08-01: 13:00:00 1
  Filled 2020-08-01: qty 1

## 2020-08-01 MED ORDER — INSULIN ASPART 100 UNIT/ML ~~LOC~~ SOLN
15.0000 [IU] | Freq: Three times a day (TID) | SUBCUTANEOUS | Status: DC
  Administered 2020-08-01: 16:00:00 15 [IU] via SUBCUTANEOUS

## 2020-08-01 MED ORDER — HYDRALAZINE HCL 25 MG PO TABS: 25.0000 mg | ORAL_TABLET | Freq: Four times a day (QID) | ORAL | Status: DC | PRN

## 2020-08-01 MED ORDER — LIVING WELL WITH DIABETES BOOK: Freq: Once | Status: AC

## 2020-08-01 MED ORDER — ORAL CARE MOUTH RINSE
15.0000 mL | Freq: Two times a day (BID) | OROMUCOSAL | Status: DC
  Administered 2020-08-01 – 2020-08-09 (×9): 15 mL via OROMUCOSAL

## 2020-08-01 MED ORDER — MAGNESIUM SULFATE 2 GM/50ML IV SOLN
2.0000 g | Freq: Once | INTRAVENOUS | Status: AC
  Administered 2020-08-01: 09:00:00 2 g via INTRAVENOUS
  Filled 2020-08-01: qty 50

## 2020-08-01 MED ORDER — FENTANYL CITRATE (PF) 100 MCG/2ML IJ SOLN
INTRAMUSCULAR | Status: AC | PRN
  Administered 2020-08-01: 50 ug via INTRAVENOUS
  Administered 2020-08-01: 25 ug via INTRAVENOUS

## 2020-08-01 MED ORDER — FENTANYL CITRATE (PF) 100 MCG/2ML IJ SOLN
INTRAMUSCULAR | Status: AC
  Filled 2020-08-01: qty 2

## 2020-08-01 MED ORDER — INSULIN GLARGINE 100 UNIT/ML ~~LOC~~ SOLN
60.0000 [IU] | SUBCUTANEOUS | Status: DC
  Administered 2020-08-01: 09:00:00 60 [IU] via SUBCUTANEOUS
  Filled 2020-08-01 (×4): qty 0.6

## 2020-08-01 MED ORDER — FLUMAZENIL 0.5 MG/5ML IV SOLN
INTRAVENOUS | Status: AC
  Filled 2020-08-01: qty 5

## 2020-08-01 MED ORDER — SODIUM CHLORIDE 3 % CONCENTRATED PEDIATRIC IV INFUSION: 15.0000 mL/h | INTRAVENOUS | Status: DC

## 2020-08-01 MED ORDER — IOHEXOL 9 MG/ML PO SOLN
ORAL | Status: AC
  Filled 2020-08-01: qty 1000

## 2020-08-01 MED ORDER — SODIUM CHLORIDE 0.9 % IV SOLN
INTRAVENOUS | Status: AC | PRN
  Administered 2020-08-01: 250 mL via INTRAVENOUS

## 2020-08-01 MED ORDER — MIDAZOLAM HCL 2 MG/2ML IJ SOLN
INTRAMUSCULAR | Status: AC | PRN
Start: 2020-08-01 — End: 2020-08-01
  Administered 2020-08-01 (×2): 0.5 mg via INTRAVENOUS

## 2020-08-01 MED ORDER — INSULIN ASPART 100 UNIT/ML ~~LOC~~ SOLN: 0.0000 [IU] | Freq: Every day | SUBCUTANEOUS | Status: DC

## 2020-08-01 MED ORDER — SODIUM CHLORIDE 3 % IV SOLN
INTRAVENOUS | Status: DC
  Filled 2020-08-01 (×3): qty 500

## 2020-08-01 MED ORDER — INSULIN ASPART 100 UNIT/ML ~~LOC~~ SOLN: 0.0000 [IU] | Freq: Three times a day (TID) | SUBCUTANEOUS | Status: DC

## 2020-08-01 MED ORDER — SODIUM CHLORIDE 3 % IV SOLN
INTRAVENOUS | Status: DC
  Filled 2020-08-01: qty 500

## 2020-08-01 MED ORDER — SODIUM CHLORIDE 0.9% FLUSH
5.0000 mL | Freq: Three times a day (TID) | INTRAVENOUS | Status: DC
  Administered 2020-08-01 – 2020-08-19 (×50): 5 mL

## 2020-08-01 MED ORDER — INSULIN ASPART 100 UNIT/ML ~~LOC~~ SOLN
0.0000 [IU] | SUBCUTANEOUS | Status: DC
  Administered 2020-08-01 (×2): 2 [IU] via SUBCUTANEOUS
  Administered 2020-08-01: 16:00:00 5 [IU] via SUBCUTANEOUS
  Administered 2020-08-02: 12:00:00 2 [IU] via SUBCUTANEOUS
  Administered 2020-08-02 (×2): 3 [IU] via SUBCUTANEOUS
  Administered 2020-08-02: 08:00:00 2 [IU] via SUBCUTANEOUS
  Administered 2020-08-02 – 2020-08-03 (×2): 3 [IU] via SUBCUTANEOUS
  Administered 2020-08-03: 20:00:00 5 [IU] via SUBCUTANEOUS
  Administered 2020-08-04: 13:00:00 3 [IU] via SUBCUTANEOUS
  Administered 2020-08-04: 16:00:00 5 [IU] via SUBCUTANEOUS
  Administered 2020-08-04: 20:00:00 3 [IU] via SUBCUTANEOUS
  Administered 2020-08-04: 01:00:00 2 [IU] via SUBCUTANEOUS
  Administered 2020-08-05 (×2): 3 [IU] via SUBCUTANEOUS
  Administered 2020-08-05: 08:00:00 2 [IU] via SUBCUTANEOUS

## 2020-08-01 MED ORDER — MAGNESIUM SULFATE 2 GM/50ML IV SOLN
2.0000 g | Freq: Once | INTRAVENOUS | Status: AC
  Administered 2020-08-01: 04:00:00 2 g via INTRAVENOUS
  Filled 2020-08-01: qty 50

## 2020-08-01 MED ORDER — SODIUM CHLORIDE 0.9 % IV SOLN: INTRAVENOUS | Status: DC

## 2020-08-01 MED ORDER — POTASSIUM CHLORIDE 10 MEQ/100ML IV SOLN
10.0000 meq | INTRAVENOUS | Status: DC
  Administered 2020-08-01 (×4): 10 meq via INTRAVENOUS
  Filled 2020-08-01: qty 100

## 2020-08-01 NOTE — Progress Notes (Addendum)
Initial Nutrition Assessment  DOCUMENTATION CODES:      INTERVENTION:  Provide nutrition education: Diabetes Plate Method    Nursing to reinforce basics during hospital stay   Recommend pt follow up with outpatient diabetes class through [facility]   NUTRITION DIAGNOSIS:   Food and nutrition related knowledge deficit related to limited prior education as evidenced by per patient/family report (New DM).   GOAL:  Patient will be able to identify 3 common sources of carbohydrate foods and components of Plate Method. She will comply with CHO modified diet.    MONITOR:   Diet advancement and percent intake and compliance. Follow labs, CBG's and patient progression.   REASON FOR ASSESSMENT:   Consult Diet education-New Diabetes diagnosis  ASSESSMENT: Patient is an obese 59 yo female with history of poor medical follow up. She presents complaining of abdominal pain and decreased oral intake. Multifocal pneumonia, hyperosmolar hyperglycemia, AKI, Hyponatremia, Hypokalemia,  Hyperphosphatemia, hypomagnesemia and hypertension.    Per review of surgery/urology notes -patient has bilateral lung nodules, renal perforation/emphysematous pyelonephritis with abscess. Plans for transfer to [facility] nephrostomy tube left perirenal drain.   Diet history: Patient is unable to participate in education today. Husband at bedside. 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 She is not big on breakfast -usually has a piece of fruit and they enjoy eating meals out. Likes sweet tea. Encouraged sugar free beverages.  Reviewed Diabetes Plate Method handout with patient's husband. RD will return on Monday to review with patient  if she is still here and more alert.   Poor intake since last Tuesday 10 days ago. She has been eating mostly fruits and drinking water and sweet tea.    Usual body weight unknown.    CBG (last 3)  Recent Labs     08/01/20 0610 08/01/20 0710 08/01/20 0728  GLUCAP 204* 204* 192*    BMP Latest Ref Rng & Units 08/01/2020 08/01/2020 07/31/2020  Glucose 70 - 99 mg/dL 938(H) 829(H) 371(IR)  BUN 6 - 20 mg/dL 678(L) 381(O) 175(Z)  Creatinine 0.44 - 1.00 mg/dL 0.25(E) 5.27(P) 8.24(M)  Sodium 135 - 145 mmol/L 104(LL) 105(LL) <102(LL)  Potassium 3.5 - 5.1 mmol/L 3.1(L) 2.9(L) 3.1(L)  Chloride 98 - 111 mmol/L 75(L) 74(L) 69(L)  CO2 22 - 32 mmol/L 14(L) 12(L) 12(L)  Calcium 8.9 - 10.3 mg/dL 7.7(L) 7.6(L) 7.2(L)     Diet Order:   Diet Order            Diet NPO time specified  Diet effective now                 EDUCATION NEEDS:   Education needs have been addressed- will continue to follow   Skin:  Skin Assessment: Reviewed RN Assessment  Last BM:  prior to admission - taut abdomen  Height:   Ht Readings from Last 1 Encounters:  08/01/20 5\' 2"  (1.575 m)    Weight:   Wt Readings from Last 1 Encounters:  08/01/20 81.1 kg    Ideal Body Weight:   50 kg   BMI:  Body mass index is 32.7 kg/m.  Estimated Nutritional Needs:   Kcal:  08/03/20  Protein:  85-90gr  Fluid:  1.5-1.6 ltiers daily  [phone] MS,RD,CSG,LDN Pager: Royann Shivers

## 2020-08-01 NOTE — Progress Notes (Signed)
Reviewed imaging with interventional radiology Dr. Deanne Coffer.  Plan for transfer to [facility].  We will plan on left nephrostomy tube and left perirenal drain into inferior abscess.   Please contact IR as soon as patient arrives at [facility].

## 2020-08-01 NOTE — Progress Notes (Signed)
PHARMACY - PHYSICIAN COMMUNICATION CRITICAL VALUE ALERT - BLOOD CULTURE IDENTIFICATION (BCID)  Tammie [name] is an 59 y.o. female who presented to [facility] on 07/31/2020 with a chief complaint of abdominal pain, found pyelonephritis with abscess, tx to [facility] for nephrostomy tube and drain placement   Assessment:  BCID called 2 of 4 bottles (one set) with E coli, no resistance noted  Name of physician (or Provider) Contacted: Dr Roxan Hockey  Current antibiotics: Zosyn 3.375gm q8  Changes to prescribed antibiotics recommended: Abx to Ceftriaxone 2gm q24   Results for orders placed or performed during the hospital encounter of 07/31/20  Blood Culture ID Panel (Reflexed) (Collected: 07/31/2020  5:26 PM)  Result Value Ref Range   Enterococcus faecalis NOT DETECTED NOT DETECTED   Enterococcus Faecium NOT DETECTED NOT DETECTED   Listeria monocytogenes NOT DETECTED NOT DETECTED   Staphylococcus species NOT DETECTED NOT DETECTED   Staphylococcus aureus (BCID) NOT DETECTED NOT DETECTED   Staphylococcus epidermidis NOT DETECTED NOT DETECTED   Staphylococcus lugdunensis NOT DETECTED NOT DETECTED   Streptococcus species NOT DETECTED NOT DETECTED   Streptococcus agalactiae NOT DETECTED NOT DETECTED   Streptococcus pneumoniae NOT DETECTED NOT DETECTED   Streptococcus pyogenes NOT DETECTED NOT DETECTED   A.calcoaceticus-baumannii NOT DETECTED NOT DETECTED   Bacteroides fragilis NOT DETECTED NOT DETECTED   Enterobacterales DETECTED (A) NOT DETECTED   Enterobacter cloacae complex NOT DETECTED NOT DETECTED   Escherichia coli DETECTED (A) NOT DETECTED   Klebsiella aerogenes NOT DETECTED NOT DETECTED   Klebsiella oxytoca NOT DETECTED NOT DETECTED   Klebsiella pneumoniae NOT DETECTED NOT DETECTED   Proteus species NOT DETECTED NOT DETECTED   Salmonella species NOT DETECTED NOT DETECTED   Serratia marcescens NOT DETECTED NOT DETECTED   Haemophilus influenzae NOT DETECTED NOT DETECTED   Neisseria  meningitidis NOT DETECTED NOT DETECTED   Pseudomonas aeruginosa NOT DETECTED NOT DETECTED   Stenotrophomonas maltophilia NOT DETECTED NOT DETECTED   Candida albicans NOT DETECTED NOT DETECTED   Candida auris NOT DETECTED NOT DETECTED   Candida glabrata NOT DETECTED NOT DETECTED   Candida krusei NOT DETECTED NOT DETECTED   Candida parapsilosis NOT DETECTED NOT DETECTED   Candida tropicalis NOT DETECTED NOT DETECTED   Cryptococcus neoformans/gattii NOT DETECTED NOT DETECTED   CTX-M ESBL NOT DETECTED NOT DETECTED   Carbapenem resistance IMP NOT DETECTED NOT DETECTED   Carbapenem resistance KPC NOT DETECTED NOT DETECTED   Carbapenem resistance NDM NOT DETECTED NOT DETECTED   Carbapenem resist OXA 48 LIKE NOT DETECTED NOT DETECTED   Carbapenem resistance VIM NOT DETECTED NOT DETECTED    Otho Bellows PharmD 08/01/2020  7:50 PM

## 2020-08-01 NOTE — Progress Notes (Addendum)
eLink Physician-Brief Progress Note Patient Name: Tammie [name] DOB: October 31, 1960 MRN: [id number]   Date of Service  08/01/2020  HPI/Events of Note  Discussed with pharmacist. Blood culture with fully sensitive E coli. Is on Zosyn. Recommendations were to switch to Ceftriaxone, agree with thi.   eICU Interventions  Pharmacist placing orders Of note repeat sodium is pending and RN will call with results     Intervention Category Major Interventions: Sepsis - evaluation and management  Oretha Milch 08/01/2020, 7:56 PM   8:45 pm  Notified of sodium, it is now 110 (from 104 at 7 am today0 Is on 3% saline at 75 cc/hour Stop this for now Keep off fluids for 2 hours Repeat level at 11 pm planned - have asked that we be notified when that results  Patient comfortable on camera   1:45 am Sodium is 112 Delay in reporting labs, RN has been in touch with lab since 11 pm which is when the level was drawn Mental status has improved since prior and we will observe off all fluids for now and hope sodium does not rise further Discussed with RN and since 10 pm, urine output has been around 150 cc - monitor Next bmp is due at 3 am, I asked RN to also send cbc and mag at the same time and call us as soon as results are obtained Sodium around 6 am yesterday was 104, so 28 hour correction should not exceed around 114-115 this AM and we are at goal at this time   4:15 am Was notified now of BMP Sodium stable at 112 - acceptable  Anion gap is 15, continues with AKI Will add on LR at this time and continue q4h BMP to follow sodium and bicarb

## 2020-08-01 NOTE — Progress Notes (Signed)
Pharmacy Antibiotic Note  Tammie [name] is a 59 y.o. female admitted on 07/31/2020 with sepsis.  Pharmacy has been consulted for Zosyn dosing.  Plan: Zosyn 3.375g IV every 12 hours. Monitor labs, c/s, and patient imrpovement.  Height: 5\' 2"  (157.5 cm) Weight: 81.1 kg (178 lb 12.7 oz) IBW/kg (Calculated) : 50.1  Temp (24hrs), Avg:97.8 F (36.6 C), Min:96.8 F (36 C), Max:98.2 F (36.8 C)  Recent Labs  Lab 07/31/20 1758 07/31/20 1913 07/31/20 2155 08/01/20 0217 08/01/20 0641 08/01/20 1033  WBC 44.0*  --   --   --  49.1*  --   CREATININE  --  4.19* 3.98* 4.13* 3.82* 3.79*  LATICACIDVEN 2.0* 1.9  --   --   --   --     Estimated Creatinine Clearance: 15.8 mL/min (A) (by C-G formula based on SCr of 3.79 mg/dL (H)).    Allergies  Allergen Reactions  . Codeine Rash    Antimicrobials this admission: Zosyn 12/24 >>  Azith/CTX 12/23 >> 12/24   Microbiology results: 12/23 BCx: gram neg rods  12/24 UCx: pending  12/23 Sputum: pending    Thank you for allowing pharmacy to be a part of this patient's care.  1/24 08/01/2020 1:56 PM

## 2020-08-01 NOTE — Consult Note (Signed)
[place]  Reason for Consult: Acute abdomen?  Referring Physician:  Dr. Wynetta Emery  Chief Complaint    Abdominal Pain      HPI: Tammie [name] is a 59 y.o. female with poor medical follow up who has not seen a doctor in 30 years. She has had abdominal pain, nausea and vomiting since Tuesday 12/14 and this has only worsened. She was finally brought into the hospital with some altered mental status and worked up in the ED with labs and CXR. Abdominal imaging was not obtained at that time. She and her husband say she has been having bowel movements and vomiting but has not really been having diarrhea.  Her labs were concerning for hyponatremia down to 102, and leukocytosis to 44. She was admitted for pneumonia due to spots on CXR bilaterally. She was also found to be   This AM Dr. Wynetta Emery saw her and was worried about her distended abdomen.  He asked me to evaluate pending need for surgical intervention. CT ab/pel and head have been ordered.  She is with her husband. She denies rectal bleeding. She has never had a colonoscopy. She denies any family history of cancer.  She is on an insulin gtt here but does not take diabetes medicine at home but again does not see doctors.   Past Medical History:  Diagnosis Date  . Class 1 obesity   . Hypertension     Past Surgical History:  Procedure Laterality Date  . ABDOMINAL HYSTERECTOMY      Family History  Problem Relation Age of Onset  . Huntington's disease Father     Social History   Tobacco Use  . Smoking status: Never Smoker  . Smokeless tobacco: Never Used  Vaping Use  . Vaping Use: Never used  Substance Use Topics  . Alcohol use: Not Currently  . Drug use: Not Currently    Medications:  I have reviewed the patient's current medications. Prior to Admission:  Medications Prior to Admission  Medication Sig Dispense Refill Last Dose  . acetaminophen (TYLENOL) 500 MG tablet Take 1,000 mg by mouth every 6  (six) hours as needed.   07/31/2020 at Unknown time  . Pseudoeph-Doxylamine-DM-APAP (NYQUIL PO) Take 1 tablet by mouth daily as needed (congestion).   07/30/2020 at Unknown time   Scheduled: . iohexol      . enoxaparin (LOVENOX) injection  30 mg Subcutaneous Q24H  . insulin aspart  0-15 Units Subcutaneous TID WC  . insulin aspart  0-5 Units Subcutaneous QHS  . insulin aspart  15 Units Subcutaneous TID WC  . insulin glargine  60 Units Subcutaneous Q24H  . insulin starter kit- pen needles  1 kit Other Once   Continuous: . azithromycin Stopped (07/31/20 2151)  . cefTRIAXone (ROCEPHIN)  IV Stopped (07/31/20 2038)  . dextrose 5% lactated ringers 125 mL/hr at 08/01/20 0341  . insulin 4 Units/hr (08/01/20 0616)  . magnesium sulfate bolus IVPB 2 g (08/01/20 0909)  . potassium chloride 10 mEq (08/01/20 0908)  . sodium chloride     DUK:GURKYHCWCBJSE **OR** acetaminophen, albuterol, dextrose, hydrALAZINE, ondansetron **OR** ondansetron (ZOFRAN) IV  Allergies  Allergen Reactions  . Codeine Rash     ROS:  A comprehensive review of systems was negative except for: Constitutional: positive for confused Gastrointestinal: positive for abdominal pain, nausea and vomiting  Blood pressure (!) 131/91, pulse 80, temperature 98.1 F (36.7 C), temperature source Axillary, resp. rate (!) 21, height _0  (1.575 m), weight 81.1 kg, SpO2  97 %. Physical Exam Vitals reviewed.  HENT:     Head: Normocephalic.  Eyes:     Extraocular Movements: Extraocular movements intact.  Cardiovascular:     Rate and Rhythm: Normal rate.  Pulmonary:     Effort: Pulmonary effort is normal.  Abdominal:     General: Abdomen is protuberant. There is distension.     Palpations: Abdomen is soft.     Tenderness: There is abdominal tenderness. There is no guarding or rebound.     Comments: Very distended, tympanic abdomen but soft and no rebound or guarding, no signs of peritonitis or acute abdomen  Skin:    Comments:  Mottled lower extremities  Neurological:     General: No focal deficit present.     Mental Status: She is alert.  Psychiatric:        Mood and Affect: Mood is anxious.     Results: Results for orders placed or performed during the hospital encounter of 07/31/20 (from the past 48 hour(s))  Culture, blood (single)     Status: None (Preliminary result)   Collection Time: 07/31/20  5:26 PM   Specimen: BLOOD  Result Value Ref Range   Specimen Description BLOOD LEFT ANTECUBITAL    Special Requests      BOTTLES DRAWN AEROBIC AND ANAEROBIC Blood Culture adequate volume   Culture  Setup Time      GRAM NEGATIVE RODS AEROBIC BOTTLE Gram Stain Report Called to,Read Back By and Verified With: LARIMORE, A. 12/24 @ 0740 BEARD, S.  Performed at [facility], [address], [place], [place] [address]    Culture PENDING    Report Status PENDING   Resp Panel by RT-PCR (Flu A&B, Covid) Nasopharyngeal Swab     Status: None   Collection Time: 07/31/20  5:39 PM   Specimen: Nasopharyngeal Swab; Nasopharyngeal(NP) swabs in vial transport medium  Result Value Ref Range   SARS Coronavirus 2 by RT PCR NEGATIVE NEGATIVE    Comment: (NOTE) SARS-CoV-2 target nucleic acids are NOT DETECTED.  The SARS-CoV-2 RNA is generally detectable in upper respiratory specimens during the acute phase of infection. The lowest concentration of SARS-CoV-2 viral copies this assay can detect is 138 copies/mL. A negative result does not preclude SARS-Cov-2 infection and should not be used as the sole basis for treatment or other patient management decisions. A negative result may occur with  improper specimen collection/handling, submission of specimen other than nasopharyngeal swab, presence of viral mutation(s) within the areas targeted by this assay, and inadequate number of viral copies(<138 copies/mL). A negative result must be combined with clinical observations, patient history, and epidemiological information. The  expected result is Negative.  Fact Sheet for Patients:  EntrepreneurPulse.com.au  Fact Sheet for Healthcare Providers:  IncredibleEmployment.be  This test is no t yet approved or cleared by the [place] FDA and  has been authorized for detection and/or diagnosis of SARS-CoV-2 by FDA under an Emergency Use Authorization (EUA). This EUA will remain  in effect (meaning this test can be used) for the duration of the COVID-19 declaration under Section 564(b)(1) of the Act, 21 U.S.C.section 360bbb-3(b)(1), unless the authorization is terminated  or revoked sooner.       Influenza A by PCR NEGATIVE NEGATIVE   Influenza B by PCR NEGATIVE NEGATIVE    Comment: (NOTE) The Xpert Xpress SARS-CoV-2/FLU/RSV plus assay is intended as an aid in the diagnosis of influenza from Nasopharyngeal swab specimens and should not be used as a sole basis for treatment.  Nasal washings and aspirates are unacceptable for Xpert Xpress SARS-CoV-2/FLU/RSV testing.  Fact Sheet for Patients: EntrepreneurPulse.com.au  Fact Sheet for Healthcare Providers: IncredibleEmployment.be  This test is not yet approved or cleared by the [place] FDA and has been authorized for detection and/or diagnosis of SARS-CoV-2 by FDA under an Emergency Use Authorization (EUA). This EUA will remain in effect (meaning this test can be used) for the duration of the COVID-19 declaration under Section 564(b)(1) of the Act, 21 U.S.C. section 360bbb-3(b)(1), unless the authorization is terminated or revoked.  Performed at [facility], [address], [place], [place] [address]   CBC with Differential     Status: Abnormal   Collection Time: 07/31/20  5:58 PM  Result Value Ref Range   WBC 44.0 (H) 4.0 - 10.5 K/uL    Comment: WHITE COUNT CONFIRMED ON SMEAR   RBC 4.13 3.87 - 5.11 MIL/uL   Hemoglobin 12.6 12.0 - 15.0 g/dL   HCT 34.3 (L) 36.0 - 46.0 %   MCV  83.1 80.0 - 100.0 fL   MCH 30.5 26.0 - 34.0 pg   MCHC 36.7 (H) 30.0 - 36.0 g/dL   RDW 12.9 11.5 - 15.5 %   Platelets 225 150 - 400 K/uL   nRBC 0.0 0.0 - 0.2 %   Neutrophils Relative % 91 %   Neutro Abs 40.1 (H) 1.7 - 7.7 K/uL   Lymphocytes Relative 2 %   Lymphs Abs 0.9 0.7 - 4.0 K/uL   Monocytes Relative 1 %   Monocytes Absolute 0.5 0.1 - 1.0 K/uL   Eosinophils Relative 0 %   Eosinophils Absolute 0.0 0.0 - 0.5 K/uL   Basophils Relative 0 %   Basophils Absolute 0.0 0.0 - 0.1 K/uL   WBC Morphology INCREASED BANDS (>20% BANDS)     Comment: MILD LEFT SHIFT (1-5% METAS, OCC MYELO, OCC BANDS) TOXIC GRANULATION    Immature Granulocytes 6 %   Abs Immature Granulocytes 2.41 (H) 0.00 - 0.07 K/uL    Comment: Performed at [facility], [address], [place], [place] [address]  Ammonia     Status: None   Collection Time: 07/31/20  5:58 PM  Result Value Ref Range   Ammonia 32 9 - 35 umol/L    Comment: Performed at [facility], [address] [place], [place] [address]  Lactic acid, plasma     Status: Abnormal   Collection Time: 07/31/20  5:58 PM  Result Value Ref Range   Lactic Acid, Venous 2.0 (HH) 0.5 - 1.9 mmol/L    Comment: CRITICAL RESULT CALLED TO, READ BACK BY AND VERIFIED WITH: T EASTER,RN_0  MKELLY Performed at [facility], [address], [place], [place] [address]   Lactic acid, plasma     Status: None   Collection Time: 07/31/20  7:13 PM  Result Value Ref Range   Lactic Acid, Venous 1.9 0.5 - 1.9 mmol/L    Comment: Performed at [facility], [address] [place], [place] [address]  Comprehensive metabolic panel     Status: Abnormal   Collection Time: 07/31/20  7:13 PM  Result Value Ref Range   Sodium <102 (LL) 135 - 145 mmol/L    Comment: CRITICAL RESULT CALLED TO, READ BACK BY AND VERIFIED WITH: T EASTER,RN_1  07/31/20 MKELLY    Potassium 3.3 (L) 3.5 - 5.1 mmol/L   Chloride <65 (LL) 98 - 111 mmol/L    Comment: CRITICAL RESULT CALLED TO, READ BACK BY AND  VERIFIED WITH: T EASTER,RN_2  07/31/20 MKELLY  CO2 14 (L) 22 - 32 mmol/L   Glucose, Bld 706 (HH) 70 - 99 mg/dL    Comment: Glucose reference range applies only to samples taken after fasting for at least 8 hours. CRITICAL RESULT CALLED TO, READ BACK BY AND VERIFIED WITH: T EASTER,RN_0  07/31/20 MKELLY    BUN 112 (H) 6 - 20 mg/dL    Comment: RESULTS CONFIRMED BY MANUAL DILUTION   Creatinine, Ser 4.19 (H) 0.44 - 1.00 mg/dL   Calcium 7.2 (L) 8.9 - 10.3 mg/dL   Total Protein 5.4 (L) 6.5 - 8.1 g/dL   Albumin 2.0 (L) 3.5 - 5.0 g/dL   AST 14 (L) 15 - 41 U/L   ALT 16 0 - 44 U/L   Alkaline Phosphatase 147 (H) 38 - 126 U/L   Total Bilirubin 1.3 (H) 0.3 - 1.2 mg/dL   GFR, Estimated 12 (L) >60 mL/min    Comment: (NOTE) Calculated using the CKD-EPI Creatinine Equation (2021)    Anion gap NOT CALCULATED 5 - 15    Comment: Performed at [facility], [address] [place], [place] [address]  Lipase, blood     Status: None   Collection Time: 07/31/20  7:13 PM  Result Value Ref Range   Lipase 51 11 - 51 U/L    Comment: Performed at [facility], [address], [place], [place] [address]  Culture, blood (single)     Status: None (Preliminary result)   Collection Time: 07/31/20  8:23 PM   Specimen: BLOOD RIGHT WRIST  Result Value Ref Range   Specimen Description BLOOD RIGHT WRIST    Special Requests      BOTTLES DRAWN AEROBIC AND ANAEROBIC Blood Culture results may not be optimal due to an excessive volume of blood received in culture bottles   Culture      NO GROWTH < 12 HOURS Performed at [facility], [address] [place], [place] [address]    Report Status PENDING   CBG monitoring, ED     Status: Abnormal   Collection Time: 07/31/20  8:46 PM  Result Value Ref Range   Glucose-Capillary >600 (HH) 70 - 99 mg/dL    Comment: Glucose reference range applies only to samples taken after fasting for at least 8 hours.  CBG monitoring, ED     Status: Abnormal   Collection Time: 07/31/20   9:49 PM  Result Value Ref Range   Glucose-Capillary >600 (HH) 70 - 99 mg/dL    Comment: Glucose reference range applies only to samples taken after fasting for at least 8 hours.  Beta-hydroxybutyric acid     Status: Abnormal   Collection Time: 07/31/20  9:55 PM  Result Value Ref Range   Beta-Hydroxybutyric Acid 0.38 (H) 0.05 - 0.27 mmol/L    Comment: Performed at [facility], [address], [place], [place] [address]  Basic metabolic panel     Status: Abnormal   Collection Time: 07/31/20  9:55 PM  Result Value Ref Range   Sodium <102 (LL) 135 - 145 mmol/L    Comment: CRITICAL RESULT CALLED TO, READ BACK BY AND VERIFIED WITH: S LAMBERT,RN_1  07/31/20 MKELLY    Potassium 3.1 (L) 3.5 - 5.1 mmol/L   Chloride 69 (L) 98 - 111 mmol/L   CO2 12 (L) 22 - 32 mmol/L   Glucose, Bld 624 (HH) 70 - 99 mg/dL    Comment: Glucose reference range applies only to samples taken after fasting for at least 8 hours. CRITICAL RESULT CALLED TO, READ BACK BY  AND VERIFIED WITH: S LAMBERT,RN_0  07/31/20 MKELLY    BUN 106 (H) 6 - 20 mg/dL    Comment: RESULTS CONFIRMED BY MANUAL DILUTION   Creatinine, Ser 3.98 (H) 0.44 - 1.00 mg/dL   Calcium 7.2 (L) 8.9 - 10.3 mg/dL   GFR, Estimated 12 (L) >60 mL/min    Comment: (NOTE) Calculated using the CKD-EPI Creatinine Equation (2021)    Anion gap NOT CALCULATED 5 - 15    Comment: Performed at [facility], [address] [place], [place] [address]  CBG monitoring, ED     Status: Abnormal   Collection Time: 07/31/20 10:14 PM  Result Value Ref Range   Glucose-Capillary >600 (HH) 70 - 99 mg/dL    Comment: Glucose reference range applies only to samples taken after fasting for at least 8 hours.  CBG monitoring, ED     Status: Abnormal   Collection Time: 07/31/20 10:41 PM  Result Value Ref Range   Glucose-Capillary 560 (HH) 70 - 99 mg/dL    Comment: Glucose reference range applies only to samples taken after fasting for at least 8 hours.   Comment 1 Document in  Chart   Glucose, capillary     Status: Abnormal   Collection Time: 08/01/20 12:44 AM  Result Value Ref Range   Glucose-Capillary 334 (H) 70 - 99 mg/dL    Comment: Glucose reference range applies only to samples taken after fasting for at least 8 hours.  Glucose, capillary     Status: Abnormal   Collection Time: 08/01/20  2:10 AM  Result Value Ref Range   Glucose-Capillary 238 (H) 70 - 99 mg/dL    Comment: Glucose reference range applies only to samples taken after fasting for at least 8 hours.  Magnesium     Status: Abnormal   Collection Time: 08/01/20  2:17 AM  Result Value Ref Range   Magnesium 1.6 (L) 1.7 - 2.4 mg/dL    Comment: Performed at [facility], [address] [place], [place] [address]  Phosphorus     Status: Abnormal   Collection Time: 08/01/20  2:17 AM  Result Value Ref Range   Phosphorus 6.2 (H) 2.5 - 4.6 mg/dL    Comment: Performed at [facility], [address], [place], [place] [address]  Basic metabolic panel     Status: Abnormal   Collection Time: 08/01/20  2:17 AM  Result Value Ref Range   Sodium 105 (LL) 135 - 145 mmol/L    Comment: CRITICAL RESULT CALLED TO, READ BACK BY AND VERIFIED WITH: J PICKETT,RN_1  08/01/20 MKELLY    Potassium 2.9 (L) 3.5 - 5.1 mmol/L   Chloride 74 (L) 98 - 111 mmol/L   CO2 12 (L) 22 - 32 mmol/L   Glucose, Bld 227 (H) 70 - 99 mg/dL    Comment: Glucose reference range applies only to samples taken after fasting for at least 8 hours.   BUN 108 (H) 6 - 20 mg/dL    Comment: RESULTS CONFIRMED BY MANUAL DILUTION   Creatinine, Ser 4.13 (H) 0.44 - 1.00 mg/dL   Calcium 7.6 (L) 8.9 - 10.3 mg/dL   GFR, Estimated 12 (L) >60 mL/min    Comment: (NOTE) Calculated using the CKD-EPI Creatinine Equation (2021)    Anion gap 19 (H) 5 - 15    Comment: Performed at [facility], [address] [place], [place] [address]  Procalcitonin - Baseline     Status: None   Collection Time: 08/01/20  2:17 AM  Result Value Ref Range  Procalcitonin  >150.00 ng/mL    Comment:        Interpretation: PCT >= 10 ng/mL: Important systemic inflammatory response, almost exclusively due to severe bacterial sepsis or septic shock. (NOTE)       Sepsis PCT Algorithm           Lower Respiratory Tract                                      Infection PCT Algorithm    ----------------------------     ----------------------------         PCT < 0.25 ng/mL                PCT < 0.10 ng/mL          Strongly encourage             Strongly discourage   discontinuation of antibiotics    initiation of antibiotics    ----------------------------     -----------------------------       PCT 0.25 - 0.50 ng/mL            PCT 0.10 - 0.25 ng/mL               OR       >80% decrease in PCT            Discourage initiation of                                            antibiotics      Encourage discontinuation           of antibiotics    ----------------------------     -----------------------------         PCT >= 0.50 ng/mL              PCT 0.26 - 0.50 ng/mL                AND       <80% decrease in PCT             Encourage initiation of                                             antibiotics       Encourage continuation           of antibiotics    ----------------------------     -----------------------------        PCT >= 0.50 ng/mL                  PCT > 0.50 ng/mL               AND         increase in PCT                  Strongly encourage                                      initiation of antibiotics    Strongly encourage escalation           of antibiotics                                     -----------------------------  PCT <= 0.25 ng/mL                                                 OR                                        > 80% decrease in PCT                                      Discontinue / Do not initiate                                             antibiotics  Performed at [facility],  [address] [place], [place] [address]   Blood gas, venous     Status: Abnormal   Collection Time: 08/01/20  2:17 AM  Result Value Ref Range   FIO2 98.00    pH, Ven 7.414 7.250 - 7.430   pCO2, Ven 20.5 (L) 44.0 - 60.0 mmHg   pO2, Ven 72.5 (H) 32.0 - 45.0 mmHg   Bicarbonate 16.7 (L) 20.0 - 28.0 mmol/L   Acid-base deficit 10.9 (H) 0.0 - 2.0 mmol/L   O2 Saturation 95.0 %   Patient temperature 37.0    Collection site VENOUS    Drawn by 1528     Comment: Performed at [facility], [address], [place],  [address]  Glucose, capillary     Status: Abnormal   Collection Time: 08/01/20  3:08 AM  Result Value Ref Range   Glucose-Capillary 185 (H) 70 - 99 mg/dL    Comment: Glucose reference range applies only to samples taken after fasting for at least 8 hours.   Comment 1 Notify RN    Comment 2 Document in Chart   Glucose, capillary     Status: Abnormal   Collection Time: 08/01/20  4:11 AM  Result Value Ref Range   Glucose-Capillary 153 (H) 70 - 99 mg/dL    Comment: Glucose reference range applies only to samples taken after fasting for at least 8 hours.   Comment 1 Notify RN    Comment 2 Document in Chart   Glucose, capillary     Status: Abnormal   Collection Time: 08/01/20  5:13 AM  Result Value Ref Range   Glucose-Capillary 179 (H) 70 - 99 mg/dL    Comment: Glucose reference range applies only to samples taken after fasting for at least 8 hours.   Comment 1 Notify RN    Comment 2 Document in Chart   Glucose, capillary     Status: Abnormal   Collection Time: 08/01/20  6:10 AM  Result Value Ref Range   Glucose-Capillary 204 (H) 70 - 99 mg/dL    Comment: Glucose reference range applies only to samples taken after fasting for at least 8 hours.   Comment 1 Notify RN    Comment 2 Document in Chart   CBC WITH DIFFERENTIAL     Status: Abnormal (Preliminary result)   Collection Time: 08/01/20  6:41 AM  Result Value Ref Range   WBC  PENDING 4.0 - 10.5 K/uL   RBC 3.76 (L) 3.87 - 5.11  MIL/uL   Hemoglobin 11.4 (L) 12.0 - 15.0 g/dL   HCT 30.9 (L) 36.0 - 46.0 %   MCV 82.2 80.0 - 100.0 fL   MCH 30.3 26.0 - 34.0 pg   MCHC 36.9 (H) 30.0 - 36.0 g/dL   RDW 12.9 11.5 - 15.5 %   Platelets 206 150 - 400 K/uL    Comment: Performed at [facility], [address] [place], [place] [address]   nRBC PENDING 0.0 - 0.2 %   Neutrophils Relative % PENDING %   Neutro Abs PENDING 1.7 - 7.7 K/uL   Band Neutrophils PENDING %   Lymphocytes Relative PENDING %   Lymphs Abs PENDING 0.7 - 4.0 K/uL   Monocytes Relative PENDING %   Monocytes Absolute PENDING 0.1 - 1.0 K/uL   Eosinophils Relative PENDING %   Eosinophils Absolute PENDING 0.0 - 0.5 K/uL   Basophils Relative PENDING %   Basophils Absolute PENDING 0.0 - 0.1 K/uL   WBC Morphology PENDING    RBC Morphology PENDING    Smear Review PENDING    Other PENDING %   nRBC PENDING 0 /100 WBC   Metamyelocytes Relative PENDING %   Myelocytes PENDING %   Promyelocytes Relative PENDING %   Blasts PENDING %   Immature Granulocytes PENDING %   Abs Immature Granulocytes PENDING 0.00 - 0.07 K/uL  Comprehensive metabolic panel     Status: Abnormal   Collection Time: 08/01/20  6:41 AM  Result Value Ref Range   Sodium 104 (LL) 135 - 145 mmol/L    Comment: CRITICAL RESULT CALLED TO, READ BACK BY AND VERIFIED WITH: LARIMORE,A AT 7:20AM ON 08/01/20 BY FESTERMAN,C    Potassium 3.1 (L) 3.5 - 5.1 mmol/L   Chloride 75 (L) 98 - 111 mmol/L   CO2 14 (L) 22 - 32 mmol/L   Glucose, Bld 205 (H) 70 - 99 mg/dL    Comment: Glucose reference range applies only to samples taken after fasting for at least 8 hours.   BUN 114 (H) 6 - 20 mg/dL    Comment: RESULTS CONFIRMED BY MANUAL DILUTION   Creatinine, Ser 3.82 (H) 0.44 - 1.00 mg/dL   Calcium 7.7 (L) 8.9 - 10.3 mg/dL   Total Protein 5.4 (L) 6.5 - 8.1 g/dL   Albumin 2.1 (L) 3.5 - 5.0 g/dL   AST 19 15 - 41 U/L   ALT 19 0 - 44 U/L   Alkaline Phosphatase 142 (H) 38 - 126 U/L   Total Bilirubin 0.9 0.3 - 1.2  mg/dL   GFR, Estimated 13 (L) >60 mL/min    Comment: (NOTE) Calculated using the CKD-EPI Creatinine Equation (2021)    Anion gap 15 5 - 15    Comment: Performed at [facility], [address], [place], [place] [address]  Glucose, capillary     Status: Abnormal   Collection Time: 08/01/20  7:10 AM  Result Value Ref Range   Glucose-Capillary 204 (H) 70 - 99 mg/dL    Comment: Glucose reference range applies only to samples taken after fasting for at least 8 hours.   Comment 1 Notify RN    Comment 2 Document in Chart   Glucose, capillary     Status: Abnormal   Collection Time: 08/01/20  7:28 AM  Result Value Ref Range   Glucose-Capillary 192 (H) 70 - 99 mg/dL    Comment: Glucose reference range applies only to samples  taken after fasting for at least 8 hours.   Comment 1 Notify RN    Personally reviewed- CXR with odd nodules bilaterally? No COVID reported and COVID negative here  DG Chest Portable 1 View  Result Date: 07/31/2020 CLINICAL DATA:  Delirium EXAM: PORTABLE CHEST 1 VIEW COMPARISON:  None. FINDINGS: The heart size and mediastinal contours are within normal limits. Elevation of left hemidiaphragm Bilateral patchy airspace opacities. No pulmonary edema. No definite pleural effusion. No pneumothorax. No acute osseous abnormality. IMPRESSION: Multifocal airspace opacities that could represent infection or inflammation. COVID-19 infection not excluded. Followup PA and lateral chest X-ray is recommended in 3-4 weeks following therapy to ensure resolution and exclude underlying malignancy. Electronically Signed   By: Iven Finn M.D.   On: 07/31/2020 17:46     Assessment & Plan:  Tammie [name] is a 59 y.o. 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 She has minimal medical follow up. She also with abdominal pain and distention.  I am worried she has some cancer that is undiagnosed and metastatic versus could have a SBO from prior  surgery and vomiting led to electrolyte derangements? She is bacteremic on blood cultures. She has no history of diverticulitis.   -CT a/p and head ordered and added CT chest to further evaluate, no contrast IV due to renal insufficiency with Cr 3.82  -Discussed with Dr. Wynetta Emery and no acute abdomen. No surgery indicated at this time. -Discussed with patient and family concern for cancer and need for further workup   Dr. Celine Ahr covering the weekend and will be notified of patient.   All questions were answered to the satisfaction of the patient and family.    Virl Cagey 08/01/2020, 9:36 AM

## 2020-08-01 NOTE — Progress Notes (Signed)
3%Saline increased from 15cc/hr to 50cc/hr via verbal order from Vassie Loll, MD at bedside

## 2020-08-01 NOTE — H&P (Signed)
NAME:  Tammie [name], MRN:  [id number], DOB:  [birth year], LOS: 1 ADMISSION DATE:  07/31/2020, CONSULTATION DATE:  12/24REFERRING MD:  Laural Benes, CHIEF COMPLAINT:  Sepsis/hyponatremia/pyelonephritis w/ abscess   Brief History:  59 year old female admitted 12/24 with chief complaint of abdominal pain.  Found to have acute left pyelonephrosis with abscess, severe sepsis, acute kidney injury, hyper osmolar nonketotic hyperglycemia, severe hyponatremia  -CT imaging showed multiple pulmonary nodules, imaging of abdomen pelvis With left hydronephrosis and evidence of perinephritic abscess Was  transferred to [facility] for placement of left nephrostomy tube and left perirenal drain Critical care asked to admit given severe hyponatremia and concern for clinical decompensation  History of Present Illness:  59 year old female patient who presented to [facility] emergency room initially on 12/24 in the early a.m. hours with chief complaint of abdominal discomfort.  This had been ongoing over a 3-day history, and was associated with decreased oral intake, nausea, and feeling lightheaded.  She noted that the nausea had been going on for about 2 weeks, but had had multiple episodes of emesis and diarrhea for the 3 days prior to presentation.  She denied melena, or hematochezia.  She had poor poor p.o. intake.  Had been reporting significant thirst.  In the ER she was found to be normotensive, afebrile.  Vital signs were stable.  She did have significant leukocytosis with white blood cell count of 44, slightly elevated lactic acid, and chest x-ray showing multifocal airspace disease concerning for possible infection' her initial blood glucose was 706.  Her corrected sodium for this was roughly 116.  She was admitted to the internal medicine service with working diagnosis of multifocal pneumonia, hyperosmolar hyperglycemic state, acute kidney injury, and hyponatremia.  She was placed on broad-spectrum antibiotics.  She was  found to have a significantly distended abdomen because of this CT abdomen and pelvis was ordered.  General surgery were consulted.  Because of diffuse airspace disease a CT chest was obtained, primary and consulting team a little concerned about possible malignancy. The CT abdomen pelvis was resulted this showed multiple and bilateral pulmonary nodules which were ill-defined.  There was no acute pulmonary emboli.  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  In regards to her CT chest as noted she had bilateral multiple pulmonary nodules all of which were ill-defined.  CT of head was without intracranial hemorrhage, mass lesion or evidence of infarct.  Nephrology was consulted because of severe hyponatremia and acute kidney injury.  Because her hyponatremia was felt to be symptomatic and severe patient was started on 3% saline at 50 mL an hour, with initial goal sodium of 108.  Because of her severe hyponatremia, and concern for clinical deterioration she was asked to transfer to [facility] intensive care under the critical care service Past Medical History:   Hypertension, class I obesity  Significant Hospital Events:  12/24 admitted with chief complaint of abdominal pain.  Diagnostic imaging from CT abdomen pelvis showing left hydronephrosis, pyelonephritis, and perinephritic abscess.  Blood cultures preliminarily showing gram-negative rods, CT chest with multiple pulmonary nodules.Presenting blood glucose 706, presenting sodium 1 less than 102.  Beta hydroxybutyric acid was negative.  Was given IV hydration.  Started on  empiric antibiotics.  Transferred to [facility] following  urology consultation recommending left nephrostomy tube and perinephritic drain.  Critical care asked to admit  Consults:  Urology Nephrology General surgery at any [facility]  Procedures:  12/24 IR >>  Significant Diagnostic Tests:  CT chest abdomen and pelvis 12/24: Multiple bilateral pulmonary nodules up to 12 mm in diameter, they are ill-defined and shaggy in appearance 4 mm left UPJ calculus with mild left hydronephrosis and hydroureter the left kidney is emphysematous with a 6.4 x 3.6 x 5.9 diameter abscess CT head: Negative  Micro Data:  Urine culture 12/23 Blood culture x2 was 12/23: GNR >> Respiratory panel by PCR 12/23: Negative for Covid and influenza.  Antimicrobials:  Azithromycin and ceftriaxone 12/23 for 1 dose Zosyn 12/24  Interim History / Subjective:    Objective   Blood pressure (Abnormal) 167/61, pulse 86, temperature 98.1 F (36.7 C), temperature source Axillary, resp. rate 20, height 5\' 2"  (1.575 m), weight 81.1 kg, SpO2 96 %.        Intake/Output Summary (Last 24 hours) at 08/01/2020 1343 Last data filed at 08/01/2020 [id number] Gross per 24 hour  Intake 850 ml  Output 650 ml  Net 200 ml   Filed Weights   07/31/20 1702 08/01/20 0000  Weight: 79.4 kg 81.1 kg    Examination: General: Middle-aged obese woman in mild distress, able to lie supine HENT: Mild pallor, no icterus, no JVD, dry mucosa Lungs: Bilateral breath sounds, mild accessory muscle use Cardiovascular: S1-S2 tacky Abdomen: Soft distended, mild tenderness left flank, no guarding Extremities: Mottling of skin, left foot cold, right PT bounding, could not feel left PT but able to Doppler Neuro: Follows one-step commands, no meningeal signs, moves all 4 extremities   Chest x-ray independently reviewed which shows multifocal opacities.   Resolved Hospital Problem list     Assessment & Plan:   Severe sepsis with gram-negative rod bacteremia in the setting of left  pyelonephritis/hydronephrosis with perinephritic abscess -Even though lactate is normal and blood pressure is maintained, mottling of her extremities is concerning Plan Gentle IV hydration Day #1 Zosyn Follow-up cultures Ensure euvolemia Hold home antihypertensives Admit to intensive care Plan is for urostomy and perinephric abscess drainage by interventional radiology   Hyperosmolar nonketotic hyperglycemia Initial blood glucose in the 700s, has nonanion gap metabolic acidosis and mild elevation of beta hydroxybutyric acid Plan Continue IV hydration Insulin drip was turned off prior to transfer We will monitor beta and if rising then we start insulin drip  Acute kidney injury.  Underlying renal function unknown.  Has not had medical care for about 30 years Likely at least somewhat exacerbated by left pyelonephritis with abscess Plan Continuing volume resuscitation Continuing antibiotics nephrostomy and drain to perinephritic abscess Antibiotics Serial chemistries, remember to correct sodium for hyperglycemia   severe hyponatremia Unfortunately no urine studies sent initially making evaluation difficult.  Differential diagnosis includes SIADH versus perhaps hypovolemia however she is gotten IV fluids is who is improved. Nephrology consulted. Plan 3% saline infusion at 15 mL an hour was initiated but sodium has dropped from 103-101, will increase to 50 cc an hour Serial sodiums with goal sodium  108 DC 3% once this is achieved, after that we will resume normal saline at 100 cc an hour.  If sodium drops can use 3% saline at 1 mL/kg every 6 hours. Follow-up urine studies  Fluid and electrolyte imbalance: Mild hypomagnesemia,Hypokalemia, initially non-anion gap metabolic acidosis, now has anion gap metabolic acidosis Plan Check  lactic acid Replace potassium Serial chemistries   Multiple pulmonary nodules. Differential includes septic emboli versus metastatic malignancy  especially if primary renal malignancy in play Plan May need biopsy eventually     Best practice (evaluated daily)  Diet: N.p.o. Pain/Anxiety/Delirium protocol (if indicated): Not indicated VAP protocol (if indicated): Not indicated DVT prophylaxis: Subcutaneous heparin GI prophylaxis: PPI Glucose control: Insulin drip Mobility: Bedrest Disposition: ICU  Goals of Care:  Last date of multidisciplinary goals of care discussion: Family and staff present:  Summary of discussion:  Follow up goals of care discussion due:  Code Status: full  Labs   CBC: Recent Labs  Lab 07/31/20 1758 08/01/20 0641  WBC 44.0* 49.1*  NEUTROABS 40.1* 46.2*  HGB 12.6 11.4*  HCT 34.3* 30.9*  MCV 83.1 82.2  PLT 225 206    Basic Metabolic Panel: Recent Labs  Lab 07/31/20 1913 07/31/20 2155 08/01/20 0217 08/01/20 0641 08/01/20 1033 08/01/20 1136 08/01/20 1252  NA <102* <102* 105* 104* 103* <102* 101*  K 3.3* 3.1* 2.9* 3.1* 3.6  --   --   CL <65* 69* 74* 75* 75*  --   --   CO2 14* 12* 12* 14* 13*  --   --   GLUCOSE 706* 624* 227* 205* 151*  --   --   BUN 112* 106* 108* 114* 108*  --   --   CREATININE 4.19* 3.98* 4.13* 3.82* 3.79*  --   --   CALCIUM 7.2* 7.2* 7.6* 7.7* 7.8*  --   --   MG  --   --  1.6*  --   --   --   --   PHOS  --   --  6.2*  --   --   --   --    GFR: Estimated Creatinine Clearance: 15.8 mL/min (A) (by C-G formula based on SCr of 3.79 mg/dL (H)). Recent Labs  Lab 07/31/20 1758 07/31/20 1913 08/01/20 0217 08/01/20 0641  PROCALCITON  --   --  >150.00  --   WBC 44.0*  --   --  49.1*  LATICACIDVEN 2.0* 1.9  --   --     Liver Function Tests: Recent Labs  Lab 07/31/20 1913 08/01/20 0641  AST 14* 19  ALT 16 19  ALKPHOS 147* 142*  BILITOT 1.3* 0.9  PROT 5.4* 5.4*  ALBUMIN 2.0* 2.1*   Recent Labs  Lab 07/31/20 1913  LIPASE 51   Recent Labs  Lab 07/31/20 1758  AMMONIA 32    ABG    Component Value Date/Time   HCO3 16.7 (L) 08/01/2020 0217    ACIDBASEDEF 10.9 (H) 08/01/2020 0217   O2SAT 95.0 08/01/2020 0217     Coagulation Profile: No results for input(s): INR, PROTIME in the last 168 hours.  Cardiac Enzymes: No results for input(s): CKTOTAL, CKMB, CKMBINDEX, TROPONINI in the last 168 hours.  HbA1C: No results found for: HGBA1C  CBG: Recent Labs  Lab 08/01/20 0411 08/01/20 0513 08/01/20 0610 08/01/20 0710 08/01/20 0728  GLUCAP 153* 179* 204* 204* 192*    Review of Systems:    pt is lethargic & unable to provide   Past Medical History:  She,  has a past medical history of Class 1 obesity and Hypertension.   Surgical History:   Past Surgical History:  Procedure Laterality Date   ABDOMINAL HYSTERECTOMY       Social History:   reports that she has never smoked. She has never used smokeless tobacco. She reports previous alcohol  use. She reports previous drug use.   Family History:  Her family history includes Huntington's disease in her father.   Allergies Allergies  Allergen Reactions   Codeine Rash     Home Medications  Prior to Admission medications   Medication Sig Start Date End Date Taking? Authorizing Provider  acetaminophen (TYLENOL) 500 MG tablet Take 1,000 mg by mouth every 6 (six) hours as needed.   Yes [provider]  Pseudoeph-Doxylamine-DM-APAP (NYQUIL PO) Take 1 tablet by mouth daily as needed (congestion).   Yes [provider]     Critical care time: 39m     Cyril Mourning MD. FCCP. [facility] See Amion for pager  If no response to pager , please call [phone]  After 7:00 pm call Elink  [phone]   08/01/2020

## 2020-08-01 NOTE — Progress Notes (Signed)
MEDICATION-RELATED CONSULT NOTE   IR Procedure Consult - Anticoagulant/Antiplatelet PTA/Inpatient Med List Review by Pharmacist    Procedure: L nephrostomy tube, drain placed    Completed: 12/24 8pm  Post-Procedural bleeding risk per IR MD assessment: Standard  Antithrombotic medications on inpatient or PTA profile prior to procedure:  Lovenox 30mg  qd, last charted 12/24 at 12n    Recommended restart time per IR Post-Procedure Guidelines:  Day +1 in am   Other considerations:      Plan:   Continue Lovenox 30mg  qd on 12/25 at 12N  PharmD 08/01/2020, 8:15 PM

## 2020-08-01 NOTE — H&P (Signed)
History and Physical    Tammie [name] FAO:[id number] DOB: 1961-03-10 DOA: 07/31/2020  PCP: Patient, No Pcp Per   Patient coming from: Home.  I have personally briefly reviewed patient's old medical records in [facility] Link  Chief Complaint: Abdominal pain.  HPI: Tammie [name] is a 59 y.o. female with medical history significant of hypertension, class I obesity who is coming to the emergency department due to abdominal pain since Wednesday associated with decreased oral intake, nausea and lightheadedness.  They stated about 2 weeks ago she had nausea, multiple episodes of emesis and diarrhea for about 3 days.  No melena or hematochezia.  The symptoms subsequently subsided, but the patient states that she began coughing a day or 2 after developing the symptoms.  Since then, her appetite has been decreased and her oral intake has not been much, although she has been able to drink some fluids.  She denies blurred vision or polyuria, but states she has been thirsty recently.  A few months ago, she and her husband remembers she being thirsty and drinking a lot of fluids, but states this was during the warmer months while staying outdoors a lot.  She denies fever, complains of chills and fatigue.  No rhinorrhea, sore throat, wheezing or hemoptysis.  No chest pain, palpitations, diaphoresis, PND, orthopnea or pitting edema of the lower extremities.  ED Course: Initial vital signs were temperature 97.9 F, pulse 88, respiration 18, BP 133/70 mmHg and O2 sat 98% on room air.  The patient received IV fluids, 2 g of ceftriaxone, 500 mg of azithromycin, electrolyte replacement and was started on an insulin infusion.  Labwork: CBC showed a white count of 44.0 with 91% neutrophils, hemoglobin 12.6 g/dL and platelets 696.  Lactic acid 2.0 and then 1.9 mmol/L.  Lipase was 51.  Chemistry showed a sodium of less than 102, potassium of 3.3, chloride less than 65 and CO2 of 14 mmol/L.  Glucose 706, BUN 112, creatinine  4.19, calcium 7.2, magnesium 1.6, phosphorus 6.2 and total bilirubin 1.3 mg/dL.  Total protein is 5.4 and albumin 2.0 g/dL.  AST 14, ALT 16 and alkaline phosphatase 147 units/L.  BHA was 0.38 mmol/L.  Her procalcitonin was more than 150.0 ng/mL.  A venous blood gas showed a pH of 7.414, PCO2 of 20.5 and PO2 of 72.5 mmHg.  Bicarbonate was 16.7 and acid base deficit was 10.9 mmol/L.  Imaging: A 1 view chest radiograph shows multifocal airspace opacity that could represent infection or inflammation.  Follow-up PA/lateral chest radiograph  Review of Systems: As per HPI otherwise all other systems reviewed and are negative.  Past Medical History:  Diagnosis Date  . Hypertension     Past Surgical History:  Procedure Laterality Date  . ABDOMINAL HYSTERECTOMY     Social History  reports that she has never smoked. She has never used smokeless tobacco. She reports previous alcohol use. She reports previous drug use.  Allergies  Allergen Reactions  . Codeine Rash   Family History  Problem Relation Age of Onset  . Huntington's disease Father    Prior to Admission medications   Medication Sig Start Date End Date Taking? Authorizing Provider  acetaminophen (TYLENOL) 500 MG tablet Take 1,000 mg by mouth every 6 (six) hours as needed.   Yes [provider]  Pseudoeph-Doxylamine-DM-APAP (NYQUIL PO) Take 1 tablet by mouth daily as needed (congestion).   Yes [provider]    Physical Exam: Vitals:   07/31/20 2100 07/31/20 2130 07/31/20 2200 08/01/20  0000  BP: 132/75 (!) 149/89 (!) 148/79   Pulse: 84 88  88  Resp:    15  Temp:      TempSrc:      SpO2: 97% 97%  99%  Weight:      Height:        Constitutional: Looks acutely ill. Eyes: PERRL, lids and conjunctivae normal ENMT: Mucous membranes and lips are dry. Posterior pharynx clear of any exudate or lesions. Neck: normal, supple, no masses, no thyromegaly  Respiratory: Bibasilar crackles.  No wheezing.Normal  respiratory effort. No accessory muscle use.  Cardiovascular: Regular rate and rhythm, no murmurs / rubs / gallops. No extremity edema. 2+ pedal pulses. No carotid bruits.  Abdomen: Obese, distended.  Bowel sounds positive.  Soft, mild diffuse tenderness, no masses palpated. No hepatosplenomegaly. Musculoskeletal: Generalized nonfocal weakness.  No clubbing / cyanosis.  Good ROM, no contractures. Normal muscle tone.  Skin: no significant rashes, lesions, ulcers on limited dermatological examination. Neurologic: CN 2-12 grossly intact. Sensation intact, DTR normal. Strength 4/5 in all 4.  Psychiatric: Normal judgment and insight. Alert and oriented x 4 with some difficulty.. Normal mood.   Labs on Admission: I have personally reviewed following labs and imaging studies  CBC: Recent Labs  Lab 07/31/20 1758  WBC 44.0*  NEUTROABS 40.1*  HGB 12.6  HCT 34.3*  MCV 83.1  PLT 225    Basic Metabolic Panel: Recent Labs  Lab 07/31/20 1913 07/31/20 2155  NA <102* <102*  K 3.3* 3.1*  CL <65* 69*  CO2 14* 12*  GLUCOSE 706* 624*  BUN 112* 106*  CREATININE 4.19* 3.98*  CALCIUM 7.2* 7.2*    GFR: Estimated Creatinine Clearance: 14.8 mL/min (A) (by C-G formula based on SCr of 3.98 mg/dL (H)).  Liver Function Tests: Recent Labs  Lab 07/31/20 1913  AST 14*  ALT 16  ALKPHOS 147*  BILITOT 1.3*  PROT 5.4*  ALBUMIN 2.0*    Urine analysis: No results found for: COLORURINE, APPEARANCEUR, LABSPEC, PHURINE, GLUCOSEU, HGBUR, BILIRUBINUR, KETONESUR, PROTEINUR, UROBILINOGEN, NITRITE, LEUKOCYTESUR  Radiological Exams on Admission: DG Chest Portable 1 View  Result Date: 07/31/2020 CLINICAL DATA:  Delirium EXAM: PORTABLE CHEST 1 VIEW COMPARISON:  None. FINDINGS: The heart size and mediastinal contours are within normal limits. Elevation of left hemidiaphragm Bilateral patchy airspace opacities. No pulmonary edema. No definite pleural effusion. No pneumothorax. No acute osseous abnormality.  IMPRESSION: Multifocal airspace opacities that could represent infection or inflammation. COVID-19 infection not excluded. Followup PA and lateral chest X-ray is recommended in 3-4 weeks following therapy to ensure resolution and exclude underlying malignancy. Electronically Signed   By: Tish Frederickson M.D.   On: 07/31/2020 17:46    EKG: Independently reviewed.   Assessment/Plan Principal problem:   Multifocal pneumonia Admit to stepdown/inpatient. Supplemental oxygen as needed. Bronchodilators as needed. Continue IV fluids. Continue ceftriaxone 2 g IVPB every 24 hours. Continue azithromycin 500 mg IVPB every 24 hours. Check strep pneumoniae urinary antigen. Check Legionella urinary antigen. Check sputum Gram stain, culture and sensitivity. Follow-up blood cultures and sensitivity.  Active Problems:   Hyperosmolar hyperglycemic state (HHS) (HCC) Continue IV fluids for Continue insulin infusion. Correct electrolytes as needed. Check hemoglobin A1c. Consult diabetes coordinator.    AKI (acute kidney injury) (HCC) Dehydration in the setting of gastroenteritis/HHS. Continue IV hydration. Monitor intake and output. Awaiting urinalysis results. Follow renal function electrolytes. Check CT abdomen/pelvis.    Hyponatremia Gastroenteritis/HHS. Continue isotonic IV fluids. Monitor sodium level. Check Legionella urinary antigen.  Hypokalemia Replacing. Follow-up potassium level. Replace as needed.  Hypomagnesemia Replace. Follow magnesium level as needed.    Hyperphosphatemia In the setting of acute renal failure. Continue IV hydration.    Abdominal distention CT abdomen/pelvis without contrast once stabilized.    Severe protein-calorie malnutrition (HCC) Poor nutrition in the past 2 weeks. Diabetes coordinator has been consulted.    Hypertension Not on medications at home. Hydralazine 25 mg p.o. every 6 hours PRN.   DVT prophylaxis: Lovenox SQ. Code Status:    Full code. Family Communication:  Her husband was by bedside. Disposition Plan:   Patient is from:  Home.  Anticipated DC to:  Home.  Anticipated DC date:  08/02/2020 or 08/03/2020.  Anticipated DC barriers: Clinical condition.  Consults called: Admission status:  Stepdown/inpatient.   Severity of Illness: Very high after presenting with multifocal pneumonia, hyperosmolar nonketotic state, severe hyponatremia, hypokalemia, AKI and mild alteration in mental status.  The patient will need to stay in the hospital for treatment for at least 48 to 72 hours.  Bobette Mo MD [facility]  How to contact the [facility] Attending or Consulting provider 7A - 7P or covering provider during after hours 7P -7A, for this patient?   1. Check the care team in [facility] and look for a) attending/consulting [facility] provider listed and b) the [facility] team listed 2. Log into [email or web address] and use [place]'s universal password to access. If you do not have the password, please contact the hospital operator. 3. Locate the [facility] provider you are looking for under [facility] and page to a number that you can be directly reached. 4. If you still have difficulty reaching the provider, please page the [facility] (Director on Call) for the Hospitalists listed on amion for assistance.  08/01/2020, 12:35 AM   This document was prepared using Dragon voice recognition software and may contain some unintended transcription errors.

## 2020-08-01 NOTE — Consult Note (Addendum)
I have been asked to see the patient by Dr. Standley Dakins, for evaluation and management of left emphysematous pyelonephritis.  History of present illness: 59 year old woman with no known past medical history and had not been to doctor in about 30 years presented to [facility], ED with altered mental status after being sick for approximately 1.5 weeks.  All history is obtained through patient's husband.  Patient is altered and cannot give history.  The patient's husband states patient became sick about a week and a half ago with GI symptoms.  She stayed in bed for about 3 days and did not eat very much.  He encouraged her to go to the hospital about a week ago but she was too nervous to go.  Yesterday the patient got up to go to the bathroom and fell and her husband noticed that she was slurring her words.  She went to the [facility], ED and was found to have emphysematous pyelonephritis of the left kidney associated with a 4 mm left UPJ calculus and perirenal abscess.  Patient's white blood cell count was 44 and glucose 706.  Patient also found to have profound hyponatremia 102.   Review of systems: A 12 point comprehensive review of systems was obtained and is negative unless otherwise stated in the history of present illness.  Patient Active Problem List   Diagnosis Date Noted  . Hyperosmolar hyperglycemic state (HHS) (HCC) 08/01/2020  . Hyponatremia 08/01/2020  . Hypertension   . Acute renal failure (HCC)   . Hypokalemia   . Severe protein-calorie malnutrition (HCC)   . Class 1 obesity   . Leukocytosis   . Hypomagnesemia   . Hyperphosphatemia   . Abdominal distention   . Lung nodules   . Multifocal pneumonia 07/31/2020    No current facility-administered medications on file prior to encounter.   Current Outpatient Medications on File Prior to Encounter  Medication Sig Dispense Refill  . acetaminophen (TYLENOL) 500 MG tablet Take 1,000 mg by mouth every 6 (six) hours as needed.     . Pseudoeph-Doxylamine-DM-APAP (NYQUIL PO) Take 1 tablet by mouth daily as needed (congestion).      Past Medical History:  Diagnosis Date  . Class 1 obesity   . Hypertension     Past Surgical History:  Procedure Laterality Date  . ABDOMINAL HYSTERECTOMY      Social History   Tobacco Use  . Smoking status: Never Smoker  . Smokeless tobacco: Never Used  Vaping Use  . Vaping Use: Never used  Substance Use Topics  . Alcohol use: Not Currently  . Drug use: Not Currently    Family History  Problem Relation Age of Onset  . Huntington's disease Father     PE: Vitals:   08/01/20 1300 08/01/20 1535 08/01/20 1600 08/01/20 1700  BP: (!) 167/61  (!) 159/59 (!) 152/54  Pulse: 86 84 85 84  Resp: 20 (!) 23 (!) 23 (!) 23  Temp:  98.1 F (36.7 C) 98.3 F (36.8 C)   TempSrc:  Axillary Axillary   SpO2: 96% 96% 97% 94%  Weight:      Height:       Patient sleeping Atraumatic normocephalic head No cervical or supraclavicular lymphadenopathy appreciated Audible breathing Regular sinus rhythm/rate Abdomen is soft, distended, non-tender GU no left CVA tenderness Lower extremities are symmetric without appreciable edema Patient arousable to name but unable to answer questions No identifiable skin lesions  Recent Labs    07/31/20 1758 08/01/20 0641  WBC 44.0* 49.1*  HGB 12.6 11.4*  HCT 34.3* 30.9*   Recent Labs    08/01/20 0217 08/01/20 0641 08/01/20 1033 08/01/20 1136 08/01/20 1252  NA 105* 104* 103* <102* 101*  K 2.9* 3.1* 3.6  --   --   CL 74* 75* 75*  --   --   CO2 12* 14* 13*  --   --   GLUCOSE 227* 205* 151*  --   --   BUN 108* 114* 108*  --   --   CREATININE 4.13* 3.82* 3.79*  --   --   CALCIUM 7.6* 7.7* 7.8*  --   --    No results for input(s): LABPT, INR in the last 72 hours. No results for input(s): LABURIN in the last 72 hours. Results for orders placed or performed during the hospital encounter of 07/31/20  Culture, blood (single)     Status: None  (Preliminary result)   Collection Time: 07/31/20  5:26 PM   Specimen: BLOOD  Result Value Ref Range Status   Specimen Description   Final    BLOOD LEFT ANTECUBITAL Performed at [facility], [address] [place], [place] [address]    Special Requests   Final    BOTTLES DRAWN AEROBIC AND ANAEROBIC Blood Culture adequate volume Performed at [facility], [address] [place], [place] [address]    Culture  Setup Time   Final    GRAM NEGATIVE RODS AEROBIC AND ANEROBIC BOTTLES Gram Stain Report Called to,Read Back By and Verified With: LARIMORE, A. 12/24 @ 0740 BEARD, S.  Organism ID to follow Performed at [facility], [address], [place], [place] [address]    Culture   Final    NO GROWTH < 24 HOURS Performed at [name], [address], [place], [place] [address]    Report Status PENDING  Incomplete  Resp Panel by RT-PCR (Flu A&B, Covid) Nasopharyngeal Swab     Status: None   Collection Time: 07/31/20  5:39 PM   Specimen: Nasopharyngeal Swab; Nasopharyngeal(NP) swabs in vial transport medium  Result Value Ref Range Status   SARS Coronavirus 2 by RT PCR NEGATIVE NEGATIVE Final    Comment: (NOTE) SARS-CoV-2 target nucleic acids are NOT DETECTED.  The SARS-CoV-2 RNA is generally detectable in upper respiratory specimens during the acute phase of infection. The lowest concentration of SARS-CoV-2 viral copies this assay can detect is 138 copies/mL. A negative result does not preclude SARS-Cov-2 infection and should not be used as the sole basis for treatment or other patient management decisions. A negative result may occur with  improper specimen collection/handling, submission of specimen other than nasopharyngeal swab, presence of viral mutation(s) within the areas targeted by this assay, and inadequate number of viral copies(<138 copies/mL). A negative result must be combined with clinical observations, patient history, and epidemiological information. The  expected result is Negative.  Fact Sheet for Patients:  BloggerCourse.com  Fact Sheet for Healthcare Providers:  SeriousBroker.it  This test is no t yet approved or cleared by the [place] FDA and  has been authorized for detection and/or diagnosis of SARS-CoV-2 by FDA under an Emergency Use Authorization (EUA). This EUA will remain  in effect (meaning this test can be used) for the duration of the COVID-19 declaration under Section 564(b)(1) of the Act, 21 U.S.C.section 360bbb-3(b)(1), unless the authorization is terminated  or revoked sooner.       Influenza A by PCR NEGATIVE NEGATIVE Final   Influenza B  by PCR NEGATIVE NEGATIVE Final    Comment: (NOTE) The Xpert Xpress SARS-CoV-2/FLU/RSV plus assay is intended as an aid in the diagnosis of influenza from Nasopharyngeal swab specimens and should not be used as a sole basis for treatment. Nasal washings and aspirates are unacceptable for Xpert Xpress SARS-CoV-2/FLU/RSV testing.  Fact Sheet for Patients: BloggerCourse.comhttps://www.fda.gov/media/152166/download  Fact Sheet for Healthcare Providers: SeriousBroker.ithttps://www.fda.gov/media/152162/download  This test is not yet approved or cleared by the [place] FDA and has been authorized for detection and/or diagnosis of SARS-CoV-2 by FDA under an Emergency Use Authorization (EUA). This EUA will remain in effect (meaning this test can be used) for the duration of the COVID-19 declaration under Section 564(b)(1) of the Act, 21 U.S.C. section 360bbb-3(b)(1), unless the authorization is terminated or revoked.  Performed at [facility], [address] [place], [place] [address]   Culture, blood (single)     Status: None (Preliminary result)   Collection Time: 07/31/20  8:23 PM   Specimen: BLOOD RIGHT WRIST  Result Value Ref Range Status   Specimen Description BLOOD RIGHT WRIST  Final   Special Requests   Final    BOTTLES DRAWN AEROBIC AND  ANAEROBIC Blood Culture results may not be optimal due to an excessive volume of blood received in culture bottles   Culture   Final    NO GROWTH < 24 HOURS Performed at [facility], [address] [place], [place] [address]    Report Status PENDING  Incomplete  MRSA PCR Screening     Status: None   Collection Time: 08/01/20  3:26 PM   Specimen: Nasopharyngeal  Result Value Ref Range Status   MRSA by PCR NEGATIVE NEGATIVE Final    Comment:        The GeneXpert MRSA Assay (FDA approved for NASAL specimens only), is one component of a comprehensive MRSA colonization surveillance program. It is not intended to diagnose MRSA infection nor to guide or monitor treatment for MRSA infections. Performed at [facility], [address] [place], [place] [address]     Imaging: CT Chest/Abd/Pelvis 08/01/20 IMPRESSION: Multiple BILATERAL pulmonary nodules up to 12 mm diameter, some of which appear ill-defined and slightly shaggy.  These could represent metastatic foci or septic emboli.  Correlation with cardiology recommended to exclude source of septic embolism.  4 mm LEFT UPJ calculus with mild LEFT hydronephrosis and hydroureter.  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  Recommend annual imaging followup by CTA or MRA. This recommendation follows 2010 ACCF/AHA/AATS/ACR/ASA/SCA/SCAI/SIR/STS/SVM Guidelines for the Diagnosis and Management of Patients with Thoracic Aortic Disease. Circulation. 2010; 121: B147-W295: E266-e369. Aortic aneurysm NOS (ICD10-I71.9)  Aortic Atherosclerosis (ICD10-I70.0)  Findings discussed with Dr. Henreitta LeberBridges at time of exam on 08/01/2020.   Electronically Signed   By: Ulyses SouthwardMark  Boles M.D.   On: 08/01/2020 11:6734  Imp: 59 year old woman with  lsevere eft emphysematous pyelonephritis, 4 mm left UPJ calculus and clinical signs of severe sepsis with a WBC 44, hyponatremia, and glucose 706.   1. Emphysematous pyelonephritis: -Continue broad-spectrum antibiotics while blood cultures speciate -Recommend foley for maximum decompression of infected urine -I reviewed imaging with interventional radiology and discussed placing inferior perirenal abscess drain and left PCN tube as primary management with broad spectrum antibiotics -greatly appreciate IR evaluation and treatment -Given the severity of infection and metabolic derrangements, I discussed significantly  increased risk of mortality with more aggressive measures such as nephrectomy -Will continue to monitor closely  2. UPJ calculus with mild obstructive uropathy and AKI: -recommend foley catheter for maximal decompression -plan for LEFT PCN tube -hydration per nephrology   3. Metabolic derangements:  -per intensivist   Thank you for involving me in this patient's care.  Please page with any further questions or concerns. Okema Rollinson D Jais Demir

## 2020-08-01 NOTE — Procedures (Signed)
Arterial Catheter Insertion Procedure Note  Tammie [name]  [id number]  11-02-1960  Date:08/01/20  Time:4:46 PM    Provider Performing: Trey Sailors    Procedure: Insertion of Arterial Line (96789) without US guidance  Indication(s) Blood pressure monitoring and/or need for frequent ABGs  Consent Risks of the procedure as well as the alternatives and risks of each were explained to the patient and/or caregiver.  Consent for the procedure was obtained and is signed in the bedside chart  Anesthesia None   Time Out Verified patient identification, verified procedure, site/side was marked, verified correct patient position, special equipment/implants available, medications/allergies/relevant history reviewed, required imaging and test results available.   Sterile Technique Maximal sterile technique including full sterile barrier drape, hand hygiene, sterile gown, sterile gloves, mask, hair covering, sterile ultrasound probe cover (if used).   Procedure Description Area of catheter insertion was cleaned with chlorhexidine and draped in sterile fashion. Without real-time ultrasound guidance an arterial catheter was placed into the left radial artery.  Appropriate arterial tracings confirmed on monitor.     Complications/Tolerance None; patient tolerated the procedure well.   EBL Minimal   Specimen(s) None

## 2020-08-01 NOTE — Progress Notes (Signed)
CRITICAL VALUE ALERT  Critical Value: sodium 104  Date & Time Notied:  08/01/20 0732  Provider Notified: Dr. Laural Benes  Orders Received/Actions taken: see orders

## 2020-08-01 NOTE — Progress Notes (Signed)
PROGRESS NOTE   Tammie [name]  [facility]:[id number] DOB: [birth year] DOA: 07/31/2020 PCP: Patient, No Pcp Per   Chief Complaint  Patient presents with  . Abdominal Pain    Brief Admission History:  60 year old female with remote history of hypertension and obesity has not seen a physician in several decades and no regular primary care has been feeling well up until about 2 weeks ago when she started developing episodes of nausea vomiting and diarrhea that lasted several days.  She also started having symptoms of abdominal discomfort and distention that has progressed.  Her husband was not able to get her to come to seek medical care until last night when she presented with complaints of abdominal pain and poor oral intake.  She has had difficulty eating and drinking due to poor appetite.  She is also been very thirsty having polyuria and dry mouth.  She occasionally has headaches.  Denied fever and chills.  She was admitted with sepsis, hyperglycemic crisis and severe hyponatremia.  Assessment & Plan:   Principal Problem:   Multifocal pneumonia Active Problems:   Hyperosmolar hyperglycemic state (HHS) (HCC)   Hyponatremia   Hypertension   AKI (acute kidney injury) (HCC)   Hypokalemia   Severe protein-calorie malnutrition (HCC)   Class 1 obesity   Leukocytosis   Hypomagnesemia   Hyperphosphatemia   Abdominal distention   Lung nodules  1. Severe sepsis - Pt has gram negative rods in her blood culture results.  Continue IV broad spectrum antibiotics and supportive measures.  2. Severe Hyponatremia - multifactorial given severe dehydration, volume depletion and hyperglycemia. I reached out and consulted nephrologist and patient started on hypertonic saline infusion.  3. Abdominal Pain - I ordered STAT CT chest/abdomen/pelvis this morning with findings of left emphysematous pyelonephritis with abscess, suspicious lung lesions for septic vs metastatic emboli.  I spoke with Dr. Henreitta Leber and consulted  with urologist Dr. Arita Miss. Transferring patient to [facility] for IR drainage.  Spoke with Dr. Deanne Coffer who agrees for drain placement urgently.  I spoke with Dr. Imogene Burn with PCCM service who agreed to take over care of patient onto their service.   4. HHS - Pt was on IV insulin until anion gap corrected and now transitioned over to subcutaneous insulin.  Pt is NPO now for possible procedure later today.   5. Metabolic encephalopathy.  6. AKI - likely has underlying CKD.  Appreciate nephrology consultation.  7. Hypomagnesemia - IV replacement.  8. Gram negative rod bacteremia - start Zosyn IV to cover anaerobes.    DVT prophylaxis: enoxaparin  Code Status: Full  Family Communication: husband at bedside  Disposition: transfer to [facility] to [facility]M service   Status is: Inpatient  Remains inpatient appropriate because:Persistent severe electrolyte disturbances and Inpatient level of care appropriate due to severity of illness   Dispo: The patient is from: Home              Anticipated d/c is to: Home              Anticipated d/c date is: > 3 days              Patient currently is not medically stable to d/c.   Consultants:   Nephrology  Surgery  IR  Urology   Procedures:   Pending   Antimicrobials:  Ceftriaxone 12/24 Azithromycin 12/24 Zosyn 12/24>>   Subjective: Pt is very confused  Objective: Vitals:   08/01/20 1000 08/01/20 1100 08/01/20 1200 08/01/20 1300  BP: (!) 170/92 Marland Kitchen)  185/69 140/65 (!) 167/61  Pulse: 80 82 81 86  Resp: (!) 23 (!) 21 (!) 22 20  Temp:      TempSrc:      SpO2: 97% 97% 95% 96%  Weight:      Height:        Intake/Output Summary (Last 24 hours) at 08/01/2020 1315 Last data filed at 08/01/2020 0616 Gross per 24 hour  Intake 850 ml  Output 650 ml  Net 200 ml   Filed Weights   07/31/20 1702 08/01/20 0000  Weight: 79.4 kg 81.1 kg    Examination:  General exam: confused, awake, vocalizing but disoriented Respiratory system: Clear to auscultation.  Respiratory effort normal. Cardiovascular system: normal S1 & S2 heard. No JVD, murmurs, rubs, gallops or clicks. No pedal edema. Gastrointestinal system: Abdomen is distended, soft with occasional guarding, hypoactive BS. No organomegaly or masses felt.  Central nervous system: Awake. No focal neurological deficits. Extremities: Symmetric 5 x 5 power. Skin: mottled appearance.  Psychiatry: disoriented.   Data Reviewed: I have personally reviewed following labs and imaging studies  CBC: Recent Labs  Lab 07/31/20 1758 08/01/20 0641  WBC 44.0* 49.1*  NEUTROABS 40.1* 46.2*  HGB 12.6 11.4*  HCT 34.3* 30.9*  MCV 83.1 82.2  PLT 225 206    Basic Metabolic Panel: Recent Labs  Lab 07/31/20 1913 07/31/20 2155 08/01/20 0217 08/01/20 0641 08/01/20 1033 08/01/20 1136  NA <102* <102* 105* 104* 103* <102*  K 3.3* 3.1* 2.9* 3.1* 3.6  --   CL <65* 69* 74* 75* 75*  --   CO2 14* 12* 12* 14* 13*  --   GLUCOSE 706* 624* 227* 205* 151*  --   BUN 112* 106* 108* 114* 108*  --   CREATININE 4.19* 3.98* 4.13* 3.82* 3.79*  --   CALCIUM 7.2* 7.2* 7.6* 7.7* 7.8*  --   MG  --   --  1.6*  --   --   --   PHOS  --   --  6.2*  --   --   --     GFR: Estimated Creatinine Clearance: 15.8 mL/min (A) (by C-G formula based on SCr of 3.79 mg/dL (H)).  Liver Function Tests: Recent Labs  Lab 07/31/20 1913 08/01/20 0641  AST 14* 19  ALT 16 19  ALKPHOS 147* 142*  BILITOT 1.3* 0.9  PROT 5.4* 5.4*  ALBUMIN 2.0* 2.1*    CBG: Recent Labs  Lab 08/01/20 0411 08/01/20 0513 08/01/20 0610 08/01/20 0710 08/01/20 0728  GLUCAP 153* 179* 204* 204* 192*    Recent Results (from the past 240 hour(s))  Culture, blood (single)     Status: None (Preliminary result)   Collection Time: 07/31/20  5:26 PM   Specimen: BLOOD  Result Value Ref Range Status   Specimen Description BLOOD LEFT ANTECUBITAL  Final   Special Requests   Final    BOTTLES DRAWN AEROBIC AND ANAEROBIC Blood Culture adequate volume    Culture  Setup Time   Final    GRAM NEGATIVE RODS AEROBIC BOTTLE Gram Stain Report Called to,Read Back By and Verified With: LARIMORE, A. 12/24 @ 0740 BEARD, S.  Performed at [facility], [address], [place], [place] [address]    Culture PENDING  Incomplete   Report Status PENDING  Incomplete  Resp Panel by RT-PCR (Flu A&B, Covid) Nasopharyngeal Swab     Status: None   Collection Time: 07/31/20  5:39 PM   Specimen: Nasopharyngeal Swab; Nasopharyngeal(NP) swabs in vial transport  medium  Result Value Ref Range Status   SARS Coronavirus 2 by RT PCR NEGATIVE NEGATIVE Final    Comment: (NOTE) SARS-CoV-2 target nucleic acids are NOT DETECTED.  The SARS-CoV-2 RNA is generally detectable in upper respiratory specimens during the acute phase of infection. The lowest concentration of SARS-CoV-2 viral copies this assay can detect is 138 copies/mL. A negative result does not preclude SARS-Cov-2 infection and should not be used as the sole basis for treatment or other patient management decisions. A negative result may occur with  improper specimen collection/handling, submission of specimen other than nasopharyngeal swab, presence of viral mutation(s) within the areas targeted by this assay, and inadequate number of viral copies(<138 copies/mL). A negative result must be combined with clinical observations, patient history, and epidemiological information. The expected result is Negative.  Fact Sheet for Patients:  BloggerCourse.comhttps://www.fda.gov/media/152166/download  Fact Sheet for Healthcare Providers:  SeriousBroker.ithttps://www.fda.gov/media/152162/download  This test is no t yet approved or cleared by the [place] FDA and  has been authorized for detection and/or diagnosis of SARS-CoV-2 by FDA under an Emergency Use Authorization (EUA). This EUA will remain  in effect (meaning this test can be used) for the duration of the COVID-19 declaration under Section 564(b)(1) of the Act, 21 U.S.C.section  360bbb-3(b)(1), unless the authorization is terminated  or revoked sooner.       Influenza A by PCR NEGATIVE NEGATIVE Final   Influenza B by PCR NEGATIVE NEGATIVE Final    Comment: (NOTE) The Xpert Xpress SARS-CoV-2/FLU/RSV plus assay is intended as an aid in the diagnosis of influenza from Nasopharyngeal swab specimens and should not be used as a sole basis for treatment. Nasal washings and aspirates are unacceptable for Xpert Xpress SARS-CoV-2/FLU/RSV testing.  Fact Sheet for Patients: BloggerCourse.comhttps://www.fda.gov/media/152166/download  Fact Sheet for Healthcare Providers: SeriousBroker.ithttps://www.fda.gov/media/152162/download  This test is not yet approved or cleared by the [place] FDA and has been authorized for detection and/or diagnosis of SARS-CoV-2 by FDA under an Emergency Use Authorization (EUA). This EUA will remain in effect (meaning this test can be used) for the duration of the COVID-19 declaration under Section 564(b)(1) of the Act, 21 U.S.C. section 360bbb-3(b)(1), unless the authorization is terminated or revoked.  Performed at [facility], [address] [place], [place] [address]   Culture, blood (single)     Status: None (Preliminary result)   Collection Time: 07/31/20  8:23 PM   Specimen: BLOOD RIGHT WRIST  Result Value Ref Range Status   Specimen Description BLOOD RIGHT WRIST  Final   Special Requests   Final    BOTTLES DRAWN AEROBIC AND ANAEROBIC Blood Culture results may not be optimal due to an excessive volume of blood received in culture bottles   Culture   Final    NO GROWTH < 12 HOURS Performed at [facility], [address] [place], [place] [address]    Report Status PENDING  Incomplete     Radiology Studies: CT ABDOMEN PELVIS WO CONTRAST  Result Date: 08/01/2020 CLINICAL DATA:  Abnormal chest radiograph, lung nodule, generalized abdominal pain and distension, confusion, no medical care in years EXAM: CT CHEST, ABDOMEN AND PELVIS WITHOUT CONTRAST  TECHNIQUE: Multidetector CT imaging of the chest, abdomen and pelvis was performed following the standard protocol without IV contrast. IV contrast not administered due to renal dysfunction. Sagittal and coronal MPR images reconstructed from axial data set. No oral contrast administered. COMPARISON:  Chest radiograph 07/31/2020 FINDINGS: CT CHEST FINDINGS Cardiovascular: Aneurysmal dilatation of ascending thoracic aorta 4.1 cm transverse  image 24. Minimal pericardial fluid. Heart otherwise unremarkable. Mediastinum/Nodes: Questionable thickening of esophagus versus artifact from underdistention. No thoracic adenopathy identified. Base of cervical region normal appearance. Lungs/Pleura: Multiple BILATERAL pulmonary nodules up to 12 mm diameter. A more confluent area of opacity is seen in the posterior segment of the RIGHT upper lobe, 4.0 x 3.0 cm. Some of the nodules appear ill-defined and slightly shaggy a. These could represent metastatic foci or septic emboli. No definite cavitary lesions are seen. Dependent atelectasis LEFT lower lobe. No pulmonary infiltrate, pleural effusion, or pneumothorax. Musculoskeletal: Scattered endplate spur formation thoracic spine. No sclerotic or destructive osseous lesions. CT ABDOMEN PELVIS FINDINGS Hepatobiliary: Gallbladder and liver normal appearance Pancreas: Atrophic, otherwise unremarkable Spleen: Normal appearance Adrenals/Urinary Tract: Adrenal glands normal appearance. Minimal collecting system dilatation RIGHT kidney without mass or calcification. RIGHT ureter decompressed. Abnormal appearance of LEFT kidney, which is diffusely enlarged and demonstrates significant perinephric edema. In addition, significant gas is seen in the perinephric space and questionably subcapsular, minimally in the collecting system, consistent with emphysematous pyelonephritis. Edema/fluid is seen extending along the lateral conal fascia and the anterior and posterior pararenal fascia. A gas and  fluid collection is identified inferior to the LEFT kidney, 6.4 x 3.6 x 5.9 cm consistent with abscess. 4 mm calculus at LEFT ureteropelvic junction with mild LEFT hydronephrosis. Small amount of air within bladder though this could be related to prior catheterization. Stomach/Bowel: Stomach and bowel loops normal appearance. Edema from the LEFT renal process extends to the lateral conal fascia and adjacent to the descending colon but the descending colon does not appear to be thickened or involved. Vascular/Lymphatic: Minimal atherosclerotic calcification aorta. Aorta normal caliber. No definite adenopathy. Reproductive: Unremarkable uterus and adnexa Other: No free air or free fluid. LEFT retroperitoneal edema the posterior pararenal space extending into upper pelvis. No hernia. Musculoskeletal: No acute osseous findings. IMPRESSION: Multiple BILATERAL pulmonary nodules up to 12 mm diameter, some of which appear ill-defined and slightly shaggy. These could represent metastatic foci or septic emboli. Correlation with cardiology recommended to exclude source of septic embolism. 4 mm LEFT UPJ calculus with mild LEFT hydronephrosis and hydroureter. 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 Recommend annual imaging followup by CTA or MRA. This recommendation follows 2010 ACCF/AHA/AATS/ACR/ASA/SCA/SCAI/SIR/STS/SVM Guidelines for the Diagnosis and Management of Patients with Thoracic Aortic Disease. Circulation. 2010; 121: Z366-Y403. Aortic aneurysm NOS (ICD10-I71.9) Aortic Atherosclerosis (ICD10-I70.0) Findings discussed with Dr. Henreitta Leber at time of exam on 08/01/2020. Electronically Signed   By: Ulyses Southward M.D.   On: 08/01/2020 11:34   CT HEAD WO CONTRAST  Result Date: 08/01/2020 CLINICAL DATA:  Disorientation, abnormal chest  radiograph with pulmonary nodules, hyponatremia EXAM: CT HEAD WITHOUT CONTRAST TECHNIQUE: Contiguous axial images were obtained from the base of the skull through the vertex without intravenous contrast. Sagittal and coronal MPR images reconstructed from axial data set. COMPARISON:  None FINDINGS: Brain: Motion artifacts on initial imaging, for which repeat imaging was performed. Normal ventricular morphology. No midline shift or mass effect. Normal appearance of brain parenchyma. No intracranial hemorrhage, mass lesion, or evidence of acute infarction. Vascular: No hyperdense vessels Skull: Intact Sinuses/Orbits: Clear Other: N/A IMPRESSION: No acute intracranial abnormalities. Electronically Signed   By: Ulyses Southward M.D.   On: 08/01/2020 11:36   CT CHEST WO CONTRAST  Result Date: 08/01/2020 CLINICAL DATA:  Abnormal chest  radiograph, lung nodule, generalized abdominal pain and distension, confusion, no medical care in years EXAM: CT CHEST, ABDOMEN AND PELVIS WITHOUT CONTRAST TECHNIQUE: Multidetector CT imaging of the chest, abdomen and pelvis was performed following the standard protocol without IV contrast. IV contrast not administered due to renal dysfunction. Sagittal and coronal MPR images reconstructed from axial data set. No oral contrast administered. 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 Minimal pericardial fluid. Heart otherwise unremarkable. Mediastinum/Nodes: Questionable thickening of esophagus versus artifact from underdistention. No thoracic adenopathy identified. Base of cervical region normal appearance. Lungs/Pleura: Multiple BILATERAL pulmonary nodules up to 12 mm diameter. A more confluent area of opacity is seen in the posterior segment of the RIGHT upper lobe, 4.0 x 3.0 cm. Some of the nodules appear ill-defined and slightly shaggy a. These could represent metastatic  foci or septic emboli. No definite cavitary lesions are seen. Dependent atelectasis LEFT lower lobe. No pulmonary infiltrate, pleural effusion, or pneumothorax. Musculoskeletal: Scattered endplate spur formation thoracic spine. No sclerotic or destructive osseous lesions. CT ABDOMEN PELVIS FINDINGS Hepatobiliary: Gallbladder and liver normal appearance Pancreas: Atrophic, otherwise unremarkable Spleen: Normal appearance Adrenals/Urinary Tract: Adrenal glands normal appearance. Minimal collecting system dilatation RIGHT kidney without mass or calcification. RIGHT ureter decompressed. Abnormal appearance of LEFT kidney, which is diffusely enlarged and demonstrates significant perinephric edema. In addition, significant gas is seen in the perinephric space and questionably subcapsular, minimally in the collecting system, consistent with emphysematous pyelonephritis. Edema/fluid is seen extending along the lateral conal fascia and the anterior and posterior pararenal fascia. A gas and fluid collection is identified inferior to the LEFT kidney, 6.4 x 3.6 x 5.9 cm consistent with abscess. 4 mm calculus at LEFT ureteropelvic junction with mild LEFT hydronephrosis. Small amount of air within bladder though this could be related to prior catheterization. Stomach/Bowel: Stomach and bowel loops normal appearance. Edema from the LEFT renal process extends to the lateral conal fascia and adjacent to the descending colon but the descending colon does not appear to be thickened or involved. Vascular/Lymphatic: Minimal atherosclerotic calcification aorta. Aorta normal caliber. No definite adenopathy. Reproductive: Unremarkable uterus and adnexa Other: No free air or free fluid. LEFT retroperitoneal edema the posterior pararenal space extending into upper pelvis. No hernia. Musculoskeletal: No acute osseous findings. IMPRESSION: Multiple BILATERAL pulmonary nodules up to 12 mm diameter, some of which appear ill-defined and slightly  shaggy. These could represent metastatic foci or septic emboli. Correlation with cardiology recommended to exclude source of septic embolism. 4 mm LEFT UPJ calculus with mild LEFT hydronephrosis and hydroureter. 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 Recommend annual imaging followup by CTA or MRA. This recommendation follows 2010 ACCF/AHA/AATS/ACR/ASA/SCA/SCAI/SIR/STS/SVM Guidelines for the Diagnosis and Management of Patients with Thoracic Aortic Disease. Circulation. 2010; 121: J628-B151. Aortic aneurysm NOS (ICD10-I71.9) Aortic Atherosclerosis (ICD10-I70.0) Findings discussed with Dr. Henreitta Leber at time of exam on 08/01/2020. Electronically Signed   By: Ulyses Southward M.D.   On: 08/01/2020 11:34   DG Chest Portable 1 View  Result Date: 07/31/2020 CLINICAL DATA:  Delirium EXAM: PORTABLE CHEST 1 VIEW COMPARISON:  None. FINDINGS: The heart size and mediastinal contours are within normal limits. Elevation of left hemidiaphragm Bilateral patchy airspace opacities. No pulmonary edema. No definite pleural effusion. No pneumothorax. No acute osseous abnormality. IMPRESSION:  Multifocal airspace opacities that could represent infection or inflammation. COVID-19 infection not excluded. Followup PA and lateral chest X-ray is recommended in 3-4 weeks following therapy to ensure resolution and exclude underlying malignancy. Electronically Signed   By: Tish Frederickson M.D.   On: 07/31/2020 17:46   Scheduled Meds: . enoxaparin (LOVENOX) injection  30 mg Subcutaneous Q24H  . insulin aspart  0-15 Units Subcutaneous TID WC  . insulin aspart  0-5 Units Subcutaneous QHS  . insulin aspart  15 Units Subcutaneous TID WC  . insulin glargine  60 Units Subcutaneous Q24H   Continuous Infusions: . azithromycin Stopped (07/31/20 2151)  .  cefTRIAXone (ROCEPHIN)  IV Stopped (07/31/20 2038)  . dextrose 5% lactated ringers 125 mL/hr at 08/01/20 0341  . insulin 4 Units/hr (08/01/20 0616)  . potassium chloride 10 mEq (08/01/20 1255)  . sodium chloride (hypertonic) 15 mL/hr at 08/01/20 1222  . sodium chloride       LOS: 1 day   Critical Care Procedure Note Authorized and Performed by: Maryln Manuel MD  Total Critical Care time:  75 minutes  Due to a high probability of clinically significant, life threatening deterioration, the patient required my highest level of preparedness to intervene emergently and I personally spent this critical care time directly and personally managing the patient.  This critical care time included obtaining a history; examining the patient, pulse oximetry; ordering and review of studies; arranging urgent treatment with development of a management plan; evaluation of patient's response of treatment; frequent reassessment; and discussions with other providers.  This critical care time was performed to assess and manage the high probability of imminent and life threatening deterioration that could result in multi-organ failure.  It was exclusive of separately billable procedures and treating other patients and teaching time.   Standley Dakins, MD How to contact the [facility] Attending or Consulting provider 7A - 7P or covering provider during after hours 7P -7A, for this patient?  1. Check the care team in [facility] and look for a) attending/consulting [facility] provider listed and b) the [facility] team listed 2. Log into [email or web address] and use [facility] universal password to access. If you do not have the password, please contact the hospital operator. 3. Locate the [facility] provider you are looking for under [facility] and page to a number that you can be directly reached. 4. If you still have difficulty reaching the provider, please page the [facility] (Director on Call) for the Hospitalists listed on amion for  assistance.  08/01/2020, 1:15 PM

## 2020-08-01 NOTE — Progress Notes (Signed)
[facility]  Bilateral lung nodules could be septic emboli or Mets, renal perforation/ emphysematous pyelonephritis with abscess, no free air or fluid intraperitoneal.  Urology consult. May need IR too.  Updated Dr. Laural Benes.  Algis Greenhouse, MD

## 2020-08-01 NOTE — H&P (Signed)
Chief Complaint: Hydronephrosis and perinephric abscess  Referring Physician(s): Kasandra Knudsen  Supervising Physician: Oley Balm  Patient Status: [facility] - In-pt  History of Present Illness: Tammie [name] is a 59 y.o. female was brought to [facility] ED by her husband with a 1 week history of N/V/D and abdominal pain.    Her husband reported that she was more confused the past few days.    In the ED she was afebrile and stable vital signs.    Labs were notable for a glucose of 706, sodium level of <102, K 3.3, BUN 112, Cr 4.19, alb 2, WBC 44  CT showed= 4 mm LEFT UPJ calculus with mild LEFT hydronephrosis and hydroureter. 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  We are asked to place a perinephric drain and a percutaneous nephrostomy tube.      Past Medical History:  Diagnosis Date  . Class 1 obesity   . Hypertension     Past Surgical History:  Procedure Laterality Date  . ABDOMINAL HYSTERECTOMY      Allergies: Codeine  Medications: Prior to Admission medications   Medication Sig Start Date End Date Taking? Authorizing Provider  acetaminophen (TYLENOL) 500 MG tablet Take 1,000 mg by mouth every 6 (six) hours as needed.   Yes [provider]  Pseudoeph-Doxylamine-DM-APAP (NYQUIL PO) Take 1 tablet by mouth daily as needed (congestion).   Yes [provider]     Family History  Problem Relation Age of Onset  . Huntington's disease Father     Social History   Socioeconomic History  . Marital status: Married    Spouse name: Not on file  . Number of children: Not on file  . Years of education: Not on file  . Highest education level: Not on file  Occupational History  . Not on file  Tobacco Use  . Smoking status: Never Smoker  . Smokeless tobacco: Never Used  Vaping Use  . Vaping Use: Never used  Substance and  Sexual Activity  . Alcohol use: Not Currently  . Drug use: Not Currently  . Sexual activity: Not on file  Other Topics Concern  . Not on file  Social History Narrative  . Not on file   Social Determinants of Health   Financial Resource Strain: Not on file  Food Insecurity: Not on file  Transportation Needs: Not on file  Physical Activity: Not on file  Stress: Not on file  Social Connections: Not on file     Review of Systems: A 12 point ROS discussed and pertinent positives are indicated in the HPI above.  All other systems are negative.  Review of Systems  Vital Signs: BP (!) 167/61   Pulse 86   Temp 98.1 F (36.7 C) (Axillary)   Resp 20   Ht 5\' 2"  (1.575 m)   Wt 81.1 kg   LMP  (LMP Unknown)   SpO2 96%   BMI 32.70 kg/m   Physical Exam Vitals reviewed.  Constitutional:      Appearance: Normal appearance. She is toxic-appearing.  HENT:     Head: Normocephalic and atraumatic.  Eyes:     Extraocular Movements: Extraocular movements intact.  Cardiovascular:     Rate and Rhythm: Normal rate and regular rhythm.  Pulmonary:     Effort: Pulmonary effort is normal. No respiratory distress.     Breath sounds: Normal breath sounds.  Abdominal:     General: There is no distension.     Palpations: Abdomen is soft.     Tenderness: There is no abdominal tenderness.  Musculoskeletal:        General: Normal range of motion.  Skin:    General: Skin is warm and dry.  Neurological:     General: No focal deficit present.     Mental Status: She is alert.  Psychiatric:        Mood and Affect: Mood normal.        Behavior: Behavior normal.     Imaging: CT ABDOMEN PELVIS WO CONTRAST  Result Date: 08/01/2020 CLINICAL DATA:  Abnormal chest radiograph, lung nodule, generalized abdominal pain and distension, confusion, no medical care in years EXAM: CT CHEST, ABDOMEN AND PELVIS WITHOUT CONTRAST TECHNIQUE: Multidetector CT imaging of the chest, abdomen and pelvis was  performed following the standard protocol without IV contrast. IV contrast not administered due to renal dysfunction. Sagittal and coronal MPR images reconstructed from axial data set. No oral contrast administered. 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 Minimal pericardial fluid. Heart otherwise unremarkable. Mediastinum/Nodes: Questionable thickening of esophagus versus artifact from underdistention. No thoracic adenopathy identified. Base of cervical region normal appearance. Lungs/Pleura: Multiple BILATERAL pulmonary nodules up to 12 mm diameter. A more confluent area of opacity is seen in the posterior segment of the RIGHT upper lobe, 4.0 x 3.0 cm. Some of the nodules appear ill-defined and slightly shaggy a. These could represent metastatic foci or septic emboli. No definite cavitary lesions are seen. Dependent atelectasis LEFT lower lobe. No pulmonary infiltrate, pleural effusion, or pneumothorax. Musculoskeletal: Scattered endplate spur formation thoracic spine. No sclerotic or destructive osseous lesions. CT ABDOMEN PELVIS FINDINGS Hepatobiliary: Gallbladder and liver normal appearance Pancreas: Atrophic, otherwise unremarkable Spleen: Normal appearance Adrenals/Urinary Tract: Adrenal glands normal appearance. Minimal collecting system dilatation RIGHT kidney without mass or calcification. RIGHT ureter decompressed. Abnormal appearance of LEFT kidney, which is diffusely enlarged and demonstrates significant perinephric edema. In addition, significant gas is seen in the perinephric space and questionably subcapsular, minimally in the collecting system, consistent with emphysematous pyelonephritis. Edema/fluid is seen extending along the lateral conal fascia and the anterior and posterior pararenal fascia. A gas and fluid collection is identified inferior to the LEFT kidney, 6.4 x 3.6 x  5.9 cm consistent with abscess. 4 mm calculus at LEFT ureteropelvic junction with mild LEFT hydronephrosis. Small amount of air within bladder though this could be related to prior catheterization. Stomach/Bowel: Stomach and bowel loops normal appearance. Edema from the LEFT renal process extends to the lateral conal fascia and adjacent to the descending colon but the descending colon does not appear to be thickened or involved. Vascular/Lymphatic: Minimal atherosclerotic calcification aorta. Aorta normal caliber. No definite adenopathy. Reproductive: Unremarkable uterus and adnexa Other: No free air or free fluid. LEFT retroperitoneal edema the posterior pararenal space extending into upper pelvis. No hernia. Musculoskeletal: No acute osseous findings. IMPRESSION: Multiple BILATERAL pulmonary nodules up to 12 mm diameter, some of which appear ill-defined and slightly shaggy. These could represent metastatic foci or septic emboli. Correlation with cardiology recommended to exclude source of septic embolism. 4 mm LEFT UPJ calculus with mild LEFT hydronephrosis and hydroureter. 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 Recommend  annual imaging followup by CTA or MRA. This recommendation follows 2010 ACCF/AHA/AATS/ACR/ASA/SCA/SCAI/SIR/STS/SVM Guidelines for the Diagnosis and Management of Patients with Thoracic Aortic Disease. Circulation. 2010; 121: V564-P329. Aortic aneurysm NOS (ICD10-I71.9) Aortic Atherosclerosis (ICD10-I70.0) Findings discussed with Dr. Henreitta Leber at time of exam on 08/01/2020. Electronically Signed   By: Ulyses Southward M.D.   On: 08/01/2020 11:34   CT HEAD WO CONTRAST  Result Date: 08/01/2020 CLINICAL DATA:  Disorientation, abnormal chest radiograph with pulmonary nodules, hyponatremia EXAM: CT HEAD WITHOUT CONTRAST  TECHNIQUE: Contiguous axial images were obtained from the base of the skull through the vertex without intravenous contrast. Sagittal and coronal MPR images reconstructed from axial data set. COMPARISON:  None FINDINGS: Brain: Motion artifacts on initial imaging, for which repeat imaging was performed. Normal ventricular morphology. No midline shift or mass effect. Normal appearance of brain parenchyma. No intracranial hemorrhage, mass lesion, or evidence of acute infarction. Vascular: No hyperdense vessels Skull: Intact Sinuses/Orbits: Clear Other: N/A IMPRESSION: No acute intracranial abnormalities. Electronically Signed   By: Ulyses Southward M.D.   On: 08/01/2020 11:36   CT CHEST WO CONTRAST  Result Date: 08/01/2020 CLINICAL DATA:  Abnormal chest radiograph, lung nodule, generalized abdominal pain and distension, confusion, no medical care in years EXAM: CT CHEST, ABDOMEN AND PELVIS WITHOUT CONTRAST TECHNIQUE: Multidetector CT imaging of the chest, abdomen and pelvis was performed following the standard protocol without IV contrast. IV contrast not administered due to renal dysfunction. Sagittal and coronal MPR images reconstructed from axial data set. No oral contrast administered. 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 Minimal pericardial fluid. Heart otherwise unremarkable. Mediastinum/Nodes: Questionable thickening of esophagus versus artifact from underdistention. No thoracic adenopathy identified. Base of cervical region normal appearance. Lungs/Pleura: Multiple BILATERAL pulmonary nodules up to 12 mm diameter. A more confluent area of opacity is seen in the posterior segment of the RIGHT upper lobe, 4.0 x 3.0 cm. Some of the nodules appear ill-defined and slightly shaggy a. These could represent metastatic foci or septic emboli. No definite cavitary lesions are seen. Dependent  atelectasis LEFT lower lobe. No pulmonary infiltrate, pleural effusion, or pneumothorax. Musculoskeletal: Scattered endplate spur formation thoracic spine. No sclerotic or destructive osseous lesions. CT ABDOMEN PELVIS FINDINGS Hepatobiliary: Gallbladder and liver normal appearance Pancreas: Atrophic, otherwise unremarkable Spleen: Normal appearance Adrenals/Urinary Tract: Adrenal glands normal appearance. Minimal collecting system dilatation RIGHT kidney without mass or calcification. RIGHT ureter decompressed. Abnormal appearance of LEFT kidney, which is diffusely enlarged and demonstrates significant perinephric edema. In addition, significant gas is seen in the perinephric space and questionably subcapsular, minimally in the collecting system, consistent with emphysematous pyelonephritis. Edema/fluid is seen extending along the lateral conal fascia and the anterior and posterior pararenal fascia. A gas and fluid collection is identified inferior to the LEFT kidney, 6.4 x 3.6 x 5.9 cm consistent with abscess. 4 mm calculus at LEFT ureteropelvic junction with mild LEFT hydronephrosis. Small amount of air within bladder though this could be related to prior catheterization. Stomach/Bowel: Stomach and bowel loops normal appearance. Edema from the LEFT renal process extends to the lateral conal fascia and adjacent to the descending colon but the descending colon does not appear to be thickened or involved. Vascular/Lymphatic: Minimal atherosclerotic calcification aorta. Aorta normal caliber. No definite adenopathy. Reproductive: Unremarkable uterus and adnexa Other: No free air or free fluid. LEFT retroperitoneal edema the posterior pararenal space extending into upper pelvis. No hernia. Musculoskeletal: No acute osseous  findings. IMPRESSION: Multiple BILATERAL pulmonary nodules up to 12 mm diameter, some of which appear ill-defined and slightly shaggy. These could represent metastatic foci or septic emboli.  Correlation with cardiology recommended to exclude source of septic embolism. 4 mm LEFT UPJ calculus with mild LEFT hydronephrosis and hydroureter. 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 Recommend annual imaging followup by CTA or MRA. This recommendation follows 2010 ACCF/AHA/AATS/ACR/ASA/SCA/SCAI/SIR/STS/SVM Guidelines for the Diagnosis and Management of Patients with Thoracic Aortic Disease. Circulation. 2010; 121: Z610-R604. Aortic aneurysm NOS (ICD10-I71.9) Aortic Atherosclerosis (ICD10-I70.0) Findings discussed with Dr. Henreitta Leber at time of exam on 08/01/2020. Electronically Signed   By: Ulyses Southward M.D.   On: 08/01/2020 11:34   DG Chest Portable 1 View  Result Date: 07/31/2020 CLINICAL DATA:  Delirium EXAM: PORTABLE CHEST 1 VIEW COMPARISON:  None. FINDINGS: The heart size and mediastinal contours are within normal limits. Elevation of left hemidiaphragm Bilateral patchy airspace opacities. No pulmonary edema. No definite pleural effusion. No pneumothorax. No acute osseous abnormality. IMPRESSION: Multifocal airspace opacities that could represent infection or inflammation. COVID-19 infection not excluded. Followup PA and lateral chest X-ray is recommended in 3-4 weeks following therapy to ensure resolution and exclude underlying malignancy. Electronically Signed   By: Tish Frederickson M.D.   On: 07/31/2020 17:46    Labs:  CBC: Recent Labs    07/31/20 1758 08/01/20 0641  WBC 44.0* 49.1*  HGB 12.6 11.4*  HCT 34.3* 30.9*  PLT 225 206    COAGS: No results for input(s): INR, APTT in the last 8760 hours.  BMP: Recent Labs    07/31/20 2155 08/01/20 0217 08/01/20 0641 08/01/20 1033 08/01/20 1136  NA <102* 105* 104* 103* <102*  K 3.1* 2.9* 3.1* 3.6  --   CL 69* 74* 75* 75*  --   CO2 12* 12* 14* 13*  --    GLUCOSE 624* 227* 205* 151*  --   BUN 106* 108* 114* 108*  --   CALCIUM 7.2* 7.6* 7.7* 7.8*  --   CREATININE 3.98* 4.13* 3.82* 3.79*  --   GFRNONAA 12* 12* 13* 13*  --     LIVER FUNCTION TESTS: Recent Labs    07/31/20 1913 08/01/20 0641  BILITOT 1.3* 0.9  AST 14* 19  ALT 16 19  ALKPHOS 147* 142*  PROT 5.4* 5.4*  ALBUMIN 2.0* 2.1*    TUMOR MARKERS: No results for input(s): AFPTM, CEA, CA199, CHROMGRNA in the last 8760 hours.  Assessment and Plan:  Septic appearing with left perinephric abscess with hydronephrosis.  Will proceed with left nephrostomy tube and left perinephric drain into inferior abscess.  Risks and benefits of left PCN placement was discussed with the patient, her husband, and her son including, but not limited to, infection, bleeding, significant bleeding causing loss or decrease in renal function or damage to adjacent structures.   Risks and benefits of drain placement were discussed with her, her husband, and her son  discussed with the patient including bleeding, infection, damage to adjacent structures, bowel perforation/fistula connection, and sepsis.  All questions were answered, patient is agreeable to proceed. Consent signed and in chart.  Thank you for this interesting consult.  I greatly enjoyed meeting Tammie [name] and look forward to participating in their care.  A copy of this report was sent to the requesting provider on this date.  Electronically  Signed: Gwynneth Macleod, PA-C   08/01/2020, 1:07 PM      I spent a total of 40 Minutes in face to face in clinical consultation, greater than 50% of which was counseling/coordinating care for perinephric abscess drain and percutaneous nephrostomy tube placement.

## 2020-08-01 NOTE — Progress Notes (Signed)
Inpatient Diabetes Program Recommendations  AACE/ADA: New Consensus Statement on Inpatient Glycemic Control  Target Ranges:  Prepandial:   less than 140 mg/dL      Peak postprandial:   less than 180 mg/dL (1-2 hours)      Critically ill patients:  140 - 180 mg/dL  Results for Tammie, [name] (MRN [id number]) as of 08/01/2020 07:01  Ref. Range 07/31/2020 20:46 07/31/2020 21:49 07/31/2020 22:14 07/31/2020 22:41 08/01/2020 00:44 08/01/2020 02:10 08/01/2020 03:08 08/01/2020 04:11 08/01/2020 05:13  Glucose-Capillary Latest Ref Range: 70 - 99 mg/dL >600 (HH) >600 (HH) >600 (HH) 560 (HH) 334 (H) 238 (H) 185 (H) 153 (H) 179 (H)  Results for Tammie, [name] (MRN [id number]) as of 08/01/2020 07:01  Ref. Range 07/31/2020 19:13  Glucose Latest Ref Range: 70 - 99 mg/dL 706 (HH)    Review of Glycemic Control  Diabetes history: No Outpatient Diabetes medications: NA Current orders for Inpatient glycemic control: IV insulin  Inpatient Diabetes Program Recommendations:    NOTE: Noted consult for Diabetes Coordinator. Diabetes Coordinator is not on campus but available by pager from 8am to 5pm for questions or concerns. Per chart, patient has no prior DM hx. Presented with initial lab glucose of 706 mg/dl and patient was started on IV insulin. A1C in process. Ordered Living well with DM book, insulin starter kit, RD consult for diet education, and patient education on DM and insulin by bedside nursing. RN notes patient is still confused today. Will plan to follow up with patient regarding new DM dx when patient more appropriate for educatoin.  Thanks, Barnie Alderman, RN, MSN, CDE Diabetes Coordinator Inpatient Diabetes Program [phone] (Team Pager from 8am to 5pm)

## 2020-08-01 NOTE — Consult Note (Addendum)
Reason for Consult: severe hyponatremia and AKI  Referring Physician: Laural Benes, MD  Tammie [name] is an 59 y.o. female without known PMH as she has not seen a physician in 30 years was brought to [facility] ED by her husband with a 1 week history of N/V/D and abdominal pain.  Her husband reported that she was more confused the past few days.  In the ED she was afebrile and stable vital signs.  Labs were notable for a glucose of 706, sodium level of <102, K 3.3, BUN 112, Cr 4.19, alb 2, WBC 44, lactate 2 and was admitted for IVF's and insulin.  We were consulted due to the combination of severe, symptomatic hyponatremia and AKI (or new recognition of CKD).  The trend in serum sodium and Scr is seen below.  Tammie [name] is confused and the history was obtained by speaking with her husband who was at the bedside.  A CT scan of head, chest, abdomen and pelvis were ordered and preliminary report is bilateral lung nodules could be septic emboli or Mets, renal perforation/ emphysematous Pyleo with abscess, no free air or fluid intraperitoneal per Dr. Henreitta Leber.   Trend in Serum Sodium: Sodium  Date/Time Value Ref Range Status  08/01/2020 06:41 AM 104 (LL) 135 - 145 mmol/L Final  08/01/2020 02:17 AM 105 (LL) 135 - 145 mmol/L Final  07/31/2020 09:55 PM <102 (LL) 135 - 145 mmol/L Final  07/31/2020 07:13 PM <102 (LL) 135 - 145 mmol/L Final   Trend in Serum Creatinine: Creatinine, Ser  Date/Time Value Ref Range Status  08/01/2020 06:41 AM 3.82 (H) 0.44 - 1.00 mg/dL Final  16/05/9603 54:09 AM 4.13 (H) 0.44 - 1.00 mg/dL Final  81/19/1478 29:56 PM 3.98 (H) 0.44 - 1.00 mg/dL Final  21/30/8657 84:69 PM 4.19 (H) 0.44 - 1.00 mg/dL Final   PMH:   Past Medical History:  Diagnosis Date  . Class 1 obesity   . Hypertension     PSH:   Past Surgical History:  Procedure Laterality Date  . ABDOMINAL HYSTERECTOMY      Allergies:  Allergies  Allergen Reactions  . Codeine Rash    Medications:   Prior to Admission  medications   Medication Sig Start Date End Date Taking? Authorizing Provider  acetaminophen (TYLENOL) 500 MG tablet Take 1,000 mg by mouth every 6 (six) hours as needed.   Yes [provider]  Pseudoeph-Doxylamine-DM-APAP (NYQUIL PO) Take 1 tablet by mouth daily as needed (congestion).   Yes [provider]    Discontinued Meds:   Medications Discontinued During This Encounter  Medication Reason  . lactated ringers bolus 1,000 mL   . lactated ringers bolus 1,000 mL   . potassium chloride 10 mEq in 100 mL IVPB   . lactated ringers infusion   . 0.9 %  sodium chloride infusion   . enoxaparin (LOVENOX) injection 30 mg Duplicate  . 0.9 %  sodium chloride infusion   . 0.9 % NaCl with KCl 40 mEq / L  infusion   . potassium chloride 10 mEq in 100 mL IVPB     Social History:  reports that she has never smoked. She has never used smokeless tobacco. She reports previous alcohol use. She reports previous drug use.  Family History:   Family History  Problem Relation Age of Onset  . Huntington's disease Father     Review of systems not obtained due to patient factors.  Blood pressure (!) 131/91, pulse 80, temperature 98.1 F (36.7 C), temperature  source Axillary, resp. rate (!) 21, height 5\' 2"  (1.575 m), weight 81.1 kg, SpO2 97 %. General appearance: delirious and slightly agitated Head: Normocephalic, without obvious abnormality, atraumatic Resp: clear to auscultation bilaterally Cardio: regular rate and rhythm, S1, S2 normal, no murmur, click, rub or gallop GI: distended, +BS, diffuse tenderness Extremities: no edema but feet are mottled in appearance  Labs: Basic Metabolic Panel: Recent Labs  Lab 07/31/20 1913 07/31/20 2155 08/01/20 0217 08/01/20 0641 08/01/20 1033  NA <102* <102* 105* 104* 103*  K 3.3* 3.1* 2.9* 3.1* 3.6  CL <65* 69* 74* 75* 75*  CO2 14* 12* 12* 14* 13*  GLUCOSE 706* 624* 227* 205* 151*  BUN 112* 106* 108* 114* 108*  CREATININE 4.19*  3.98* 4.13* 3.82* 3.79*  ALBUMIN 2.0*  --   --  2.1*  --   CALCIUM 7.2* 7.2* 7.6* 7.7* 7.8*  PHOS  --   --  6.2*  --   --    Liver Function Tests: Recent Labs  Lab 07/31/20 1913 08/01/20 0641  AST 14* 19  ALT 16 19  ALKPHOS 147* 142*  BILITOT 1.3* 0.9  PROT 5.4* 5.4*  ALBUMIN 2.0* 2.1*   Recent Labs  Lab 07/31/20 1913  LIPASE 51   Recent Labs  Lab 07/31/20 1758  AMMONIA 32   CBC: Recent Labs  Lab 07/31/20 1758 08/01/20 0641  WBC 44.0* 49.1*  NEUTROABS 40.1* 46.2*  HGB 12.6 11.4*  HCT 34.3* 30.9*  MCV 83.1 82.2  PLT 225 206   PT/INR: @labrcntip (inr:5) Cardiac Enzymes: No results for input(s): CKTOTAL, CKMB, CKMBINDEX, TROPONINI in the last 168 hours. CBG: Recent Labs  Lab 08/01/20 0411 08/01/20 0513 08/01/20 0610 08/01/20 0710 08/01/20 0728  GLUCAP 153* 179* 204* 204* 192*    Iron Studies: No results for input(s): IRON, TIBC, TRANSFERRIN, FERRITIN in the last 168 hours.  Xrays/Other Studies: CT ABDOMEN PELVIS WO CONTRAST  Result Date: 08/01/2020 CLINICAL DATA:  Abnormal chest radiograph, lung nodule, generalized abdominal pain and distension, confusion, no medical care in years EXAM: CT CHEST, ABDOMEN AND PELVIS WITHOUT CONTRAST TECHNIQUE: Multidetector CT imaging of the chest, abdomen and pelvis was performed following the standard protocol without IV contrast. IV contrast not administered due to renal dysfunction. Sagittal and coronal MPR images reconstructed from axial data set. No oral contrast administered. 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 Minimal pericardial fluid. Heart otherwise unremarkable. Mediastinum/Nodes: Questionable thickening of esophagus versus artifact from underdistention. No thoracic adenopathy identified. Base of cervical region normal appearance. Lungs/Pleura: Multiple BILATERAL pulmonary nodules up to 12 mm  diameter. A more confluent area of opacity is seen in the posterior segment of the RIGHT upper lobe, 4.0 x 3.0 cm. Some of the nodules appear ill-defined and slightly shaggy a. These could represent metastatic foci or septic emboli. No definite cavitary lesions are seen. Dependent atelectasis LEFT lower lobe. No pulmonary infiltrate, pleural effusion, or pneumothorax. Musculoskeletal: Scattered endplate spur formation thoracic spine. No sclerotic or destructive osseous lesions. CT ABDOMEN PELVIS FINDINGS Hepatobiliary: Gallbladder and liver normal appearance Pancreas: Atrophic, otherwise unremarkable Spleen: Normal appearance Adrenals/Urinary Tract: Adrenal glands normal appearance. Minimal collecting system dilatation RIGHT kidney without mass or calcification. RIGHT ureter decompressed. Abnormal appearance of LEFT kidney, which is diffusely enlarged and demonstrates significant perinephric edema. In addition, significant gas is seen in the perinephric space and questionably subcapsular, minimally in the collecting system, consistent with emphysematous pyelonephritis. Edema/fluid is  seen extending along the lateral conal fascia and the anterior and posterior pararenal fascia. A gas and fluid collection is identified inferior to the LEFT kidney, 6.4 x 3.6 x 5.9 cm consistent with abscess. 4 mm calculus at LEFT ureteropelvic junction with mild LEFT hydronephrosis. Small amount of air within bladder though this could be related to prior catheterization. Stomach/Bowel: Stomach and bowel loops normal appearance. Edema from the LEFT renal process extends to the lateral conal fascia and adjacent to the descending colon but the descending colon does not appear to be thickened or involved. Vascular/Lymphatic: Minimal atherosclerotic calcification aorta. Aorta normal caliber. No definite adenopathy. Reproductive: Unremarkable uterus and adnexa Other: No free air or free fluid. LEFT retroperitoneal edema the posterior  pararenal space extending into upper pelvis. No hernia. Musculoskeletal: No acute osseous findings. IMPRESSION: Multiple BILATERAL pulmonary nodules up to 12 mm diameter, some of which appear ill-defined and slightly shaggy. These could represent metastatic foci or septic emboli. Correlation with cardiology recommended to exclude source of septic embolism. 4 mm LEFT UPJ calculus with mild LEFT hydronephrosis and hydroureter. 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 Recommend annual imaging followup by CTA or MRA. This recommendation follows 2010 ACCF/AHA/AATS/ACR/ASA/SCA/SCAI/SIR/STS/SVM Guidelines for the Diagnosis and Management of Patients with Thoracic Aortic Disease. Circulation. 2010; 121: G956-O130: E266-e369. Aortic aneurysm NOS (ICD10-I71.9) Aortic Atherosclerosis (ICD10-I70.0) Findings discussed with Dr. Henreitta LeberBridges at time of exam on 08/01/2020. Electronically Signed   By: Ulyses SouthwardMark  Boles M.D.   On: 08/01/2020 11:34   CT HEAD WO CONTRAST  Result Date: 08/01/2020 CLINICAL DATA:  Disorientation, abnormal chest radiograph with pulmonary nodules, hyponatremia EXAM: CT HEAD WITHOUT CONTRAST TECHNIQUE: Contiguous axial images were obtained from the base of the skull through the vertex without intravenous contrast. Sagittal and coronal MPR images reconstructed from axial data set. COMPARISON:  None FINDINGS: Brain: Motion artifacts on initial imaging, for which repeat imaging was performed. Normal ventricular morphology. No midline shift or mass effect. Normal appearance of brain parenchyma. No intracranial hemorrhage, mass lesion, or evidence of acute infarction. Vascular: No hyperdense vessels Skull: Intact Sinuses/Orbits: Clear Other: N/A IMPRESSION: No acute intracranial abnormalities. Electronically Signed   By: Ulyses SouthwardMark  Boles M.D.   On:  08/01/2020 11:36   CT CHEST WO CONTRAST  Result Date: 08/01/2020 CLINICAL DATA:  Abnormal chest radiograph, lung nodule, generalized abdominal pain and distension, confusion, no medical care in years EXAM: CT CHEST, ABDOMEN AND PELVIS WITHOUT CONTRAST TECHNIQUE: Multidetector CT imaging of the chest, abdomen and pelvis was performed following the standard protocol without IV contrast. IV contrast not administered due to renal dysfunction. Sagittal and coronal MPR images reconstructed from axial data set. No oral contrast administered. 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 Minimal pericardial fluid. Heart otherwise unremarkable. Mediastinum/Nodes: Questionable thickening of esophagus versus artifact from underdistention. No thoracic adenopathy identified. Base of cervical region normal appearance. Lungs/Pleura: Multiple BILATERAL pulmonary nodules up to 12 mm diameter. A more confluent area of opacity is seen in the posterior segment of the RIGHT upper lobe, 4.0 x 3.0 cm. Some of the nodules appear ill-defined and slightly shaggy a. These could represent metastatic foci or septic emboli. No definite cavitary lesions are seen. Dependent atelectasis LEFT lower lobe. No pulmonary infiltrate, pleural effusion, or pneumothorax. Musculoskeletal: Scattered endplate spur formation  thoracic spine. No sclerotic or destructive osseous lesions. CT ABDOMEN PELVIS FINDINGS Hepatobiliary: Gallbladder and liver normal appearance Pancreas: Atrophic, otherwise unremarkable Spleen: Normal appearance Adrenals/Urinary Tract: Adrenal glands normal appearance. Minimal collecting system dilatation RIGHT kidney without mass or calcification. RIGHT ureter decompressed. Abnormal appearance of LEFT kidney, which is diffusely enlarged and demonstrates significant perinephric edema. In addition, significant gas is seen  in the perinephric space and questionably subcapsular, minimally in the collecting system, consistent with emphysematous pyelonephritis. Edema/fluid is seen extending along the lateral conal fascia and the anterior and posterior pararenal fascia. A gas and fluid collection is identified inferior to the LEFT kidney, 6.4 x 3.6 x 5.9 cm consistent with abscess. 4 mm calculus at LEFT ureteropelvic junction with mild LEFT hydronephrosis. Small amount of air within bladder though this could be related to prior catheterization. Stomach/Bowel: Stomach and bowel loops normal appearance. Edema from the LEFT renal process extends to the lateral conal fascia and adjacent to the descending colon but the descending colon does not appear to be thickened or involved. Vascular/Lymphatic: Minimal atherosclerotic calcification aorta. Aorta normal caliber. No definite adenopathy. Reproductive: Unremarkable uterus and adnexa Other: No free air or free fluid. LEFT retroperitoneal edema the posterior pararenal space extending into upper pelvis. No hernia. Musculoskeletal: No acute osseous findings. IMPRESSION: Multiple BILATERAL pulmonary nodules up to 12 mm diameter, some of which appear ill-defined and slightly shaggy. These could represent metastatic foci or septic emboli. Correlation with cardiology recommended to exclude source of septic embolism. 4 mm LEFT UPJ calculus with mild LEFT hydronephrosis and hydroureter. 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 Recommend annual imaging followup by CTA or MRA. This recommendation follows 2010 ACCF/AHA/AATS/ACR/ASA/SCA/SCAI/SIR/STS/SVM Guidelines for the Diagnosis and Management of Patients with Thoracic Aortic Disease. Circulation. 2010; 121: Z610-R604. Aortic aneurysm NOS (ICD10-I71.9) Aortic  Atherosclerosis (ICD10-I70.0) Findings discussed with Dr. Henreitta Leber at time of exam on 08/01/2020. Electronically Signed   By: Ulyses Southward M.D.   On: 08/01/2020 11:34   DG Chest Portable 1 View  Result Date: 07/31/2020 CLINICAL DATA:  Delirium EXAM: PORTABLE CHEST 1 VIEW COMPARISON:  None. FINDINGS: The heart size and mediastinal contours are within normal limits. Elevation of left hemidiaphragm Bilateral patchy airspace opacities. No pulmonary edema. No definite pleural effusion. No pneumothorax. No acute osseous abnormality. IMPRESSION: Multifocal airspace opacities that could represent infection or inflammation. COVID-19 infection not excluded. Followup PA and lateral chest X-ray is recommended in 3-4 weeks following therapy to ensure resolution and exclude underlying malignancy. Electronically Signed   By: Tish Frederickson M.D.   On: 07/31/2020 17:46     Assessment/Plan: 1.  Hyponatremia, severe and symptomatic- unclear duration and if this is solely from volume depletion and hyperglycemia or possible SIADH due to malignancy.   Recommend starting 3% saline at 15 ml/hr with goal sodium of 108 with infusion (can stop infusion at 108) and then resume NS at 100 ml/hr with goal sodium level of 114 over the next 24 hours.  Will need regular sodium levels checked and need to avoid overcorrection as she is at high risk for osmotic demyelination.   May also use boluses of 3% saline at 54ml/kg every 6 hours if sodium level is dropping. 2. AKI or new diagnosis of CKD- pt without known medical care for 30 years.  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  BUN/Cr improving with IVF's and no indication for dialysis at this time. 3. 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  May need IR for abscess drainage. 4. Metabolic acidosis - due to AKI and sepsis.  Follow 5. Pulmonary nodules- ?septic emboli or malignancy.   6. Hyperosmolar hyperglycemic state- on insulin infusion per primary.  Hgb A1c pending. 7. Hypokalemia- improving.   Jomarie Longs A Ladarious Kresse 08/01/2020, 10:59 AM

## 2020-08-01 NOTE — Procedures (Signed)
  Procedure: CT guided L nephrostomy catheter placement, CT guided L pelvic abscess drain placement both purulent output, samples sent for GS, C&S EBL:   minimal Complications:  none immediate  See full dictation in [employer].  Thora Lance MD Main # [phone] Pager  [phone] Mobile [phone]

## 2020-08-01 NOTE — Progress Notes (Signed)
CRITICAL VALUE ALERT  Critical Value:  Sodium 106  Date & Time Notied: 1800  Provider Notified: Dr. Vassie Loll  Orders Received/Actions taken: Continue hypertonic fluid at 46mL/hr

## 2020-08-02 DIAGNOSIS — Z978 Presence of other specified devices: Secondary | ICD-10-CM | POA: Diagnosis not present

## 2020-08-02 DIAGNOSIS — N132 Hydronephrosis with renal and ureteral calculous obstruction: Secondary | ICD-10-CM | POA: Diagnosis not present

## 2020-08-02 DIAGNOSIS — N10 Acute tubulo-interstitial nephritis: Secondary | ICD-10-CM | POA: Diagnosis not present

## 2020-08-02 DIAGNOSIS — N151 Renal and perinephric abscess: Secondary | ICD-10-CM | POA: Diagnosis not present

## 2020-08-02 DIAGNOSIS — N179 Acute kidney failure, unspecified: Secondary | ICD-10-CM

## 2020-08-02 LAB — BASIC METABOLIC PANEL
Anion gap: 15 (ref 5–15)
BUN: 113 mg/dL — ABNORMAL HIGH (ref 6–20)
CO2: 14 mmol/L — ABNORMAL LOW (ref 22–32)
Calcium: 7.9 mg/dL — ABNORMAL LOW (ref 8.9–10.3)
Chloride: 83 mmol/L — ABNORMAL LOW (ref 98–111)
Creatinine, Ser: 3.66 mg/dL — ABNORMAL HIGH (ref 0.44–1.00)
GFR, Estimated: 14 mL/min — ABNORMAL LOW (ref >60)
Glucose, Bld: 153 mg/dL — ABNORMAL HIGH (ref 70–99)
Potassium: 3.4 mmol/L — ABNORMAL LOW (ref 3.5–5.1)
Sodium: 112 mmol/L — CL (ref 135–145)

## 2020-08-02 LAB — GLUCOSE, CAPILLARY
Glucose-Capillary: 166 mg/dL — ABNORMAL HIGH (ref 70–99)
Glucose-Capillary: 142 mg/dL — ABNORMAL HIGH (ref 70–99)
Glucose-Capillary: 139 mg/dL — ABNORMAL HIGH (ref 70–99)
Glucose-Capillary: 164 mg/dL — ABNORMAL HIGH (ref 70–99)
Glucose-Capillary: 188 mg/dL — ABNORMAL HIGH (ref 70–99)
Glucose-Capillary: 158 mg/dL — ABNORMAL HIGH (ref 70–99)

## 2020-08-02 LAB — BETA-HYDROXYBUTYRIC ACID: Beta-Hydroxybutyric Acid: 0.74 mmol/L — ABNORMAL HIGH (ref 0.05–0.27)

## 2020-08-02 LAB — MAGNESIUM: Magnesium: 2.6 mg/dL — ABNORMAL HIGH (ref 1.7–2.4)

## 2020-08-02 LAB — CBC WITH DIFFERENTIAL/PLATELET
Abs Immature Granulocytes: 2.52 10*3/uL — ABNORMAL HIGH (ref 0.00–0.07)
Basophils Absolute: 0 10*3/uL (ref 0.0–0.1)
Basophils Relative: 0 %
Eosinophils Absolute: 0 10*3/uL (ref 0.0–0.5)
Eosinophils Relative: 0 %
HCT: 27.9 % — ABNORMAL LOW (ref 36.0–46.0)
Hemoglobin: 10.4 g/dL — ABNORMAL LOW (ref 12.0–15.0)
Immature Granulocytes: 5 %
Lymphocytes Relative: 2 %
Lymphs Abs: 0.8 10*3/uL (ref 0.7–4.0)
MCH: 31 pg (ref 26.0–34.0)
MCHC: 37.3 g/dL — ABNORMAL HIGH (ref 30.0–36.0)
MCV: 83.3 fL (ref 80.0–100.0)
Monocytes Absolute: 0.8 10*3/uL (ref 0.1–1.0)
Monocytes Relative: 2 %
Neutro Abs: 43.5 10*3/uL — ABNORMAL HIGH (ref 1.7–7.7)
Neutrophils Relative %: 91 %
Platelets: 237 10*3/uL (ref 150–400)
RBC: 3.35 MIL/uL — ABNORMAL LOW (ref 3.87–5.11)
RDW: 13.9 % (ref 11.5–15.5)
WBC Morphology: INCREASED
WBC: 47.6 10*3/uL — ABNORMAL HIGH (ref 4.0–10.5)
nRBC: 0 % (ref 0.0–0.2)

## 2020-08-02 LAB — SODIUM
Sodium: 112 mmol/L — CL (ref 135–145)
Sodium: 114 mmol/L — CL (ref 135–145)
Sodium: 115 mmol/L — CL (ref 135–145)
Sodium: 118 mmol/L — CL (ref 135–145)

## 2020-08-02 LAB — URINE CULTURE: Culture: <10000 — AB

## 2020-08-02 MED ORDER — INSULIN GLARGINE 100 UNIT/ML ~~LOC~~ SOLN
20.0000 [IU] | Freq: Two times a day (BID) | SUBCUTANEOUS | Status: DC
  Administered 2020-08-02 (×2): 20 [IU] via SUBCUTANEOUS
  Filled 2020-08-02 (×3): qty 0.2

## 2020-08-02 MED ORDER — LACTATED RINGERS IV SOLN: INTRAVENOUS | Status: DC

## 2020-08-02 MED ORDER — STERILE WATER FOR INJECTION IV SOLN
INTRAVENOUS | Status: DC
  Filled 2020-08-02: qty 850
  Filled 2020-08-02 (×5): qty 150

## 2020-08-02 NOTE — Progress Notes (Signed)
CRITICAL VALUE ALERT  Critical Value:  Na 114  Date & Time Notied:  08/02/20 0835  Provider Notified: Dr. Vassie Loll

## 2020-08-02 NOTE — Progress Notes (Signed)
[facility] ROUNDING NOTE   Subjective:   Brief history: This a 59 year old lady transferred to [facility] long hospital from [facility] 08/01/2020.  She was admitted 07/31/2020 with severe sepsis and E. coli bacteremia in the setting of left pyelonephritis and hydronephrosis with a perinephric abscess.  She continues on ceftriaxone antibiotics.  She has acute kidney injury but there is no known underlying renal failure.  She has not   sought medical care in 20 years.  She underwent left PCN with perinephric drain 08/02/2020 appreciate the assistance of Dr. Jettie Pagan  Blood pressure 111/60 pulse 73 temperature 98 O2 sats 93% room air  Urine output 2 L 08/01/2020  Sodium 114 potassium 3.4 chloride 83 CO2 14 BUN 113 creatinine 3.66 glucose 153 hemoglobin 10.4  Objective:  Vital signs in last 24 hours:  Temp:  [98 F (36.7 C)-98.6 F (37 C)] 98 F (36.7 C) (12/25 0800) Pulse Rate:  [71-88] 76 (12/25 1000) Resp:  [15-28] 18 (12/25 1000) BP: (97-167)/(43-106) 128/57 (12/25 1000) SpO2:  [80 %-98 %] 90 % (12/25 1000) Arterial Line BP: (74-199)/(34-91) 146/53 (12/25 1000)  Weight change:  Filed Weights   07/31/20 1702 08/01/20 0000  Weight: 79.4 kg 81.1 kg    Intake/Output: I/O last 3 completed shifts: In: 2524.7 [I.V.:1127.8; IV Piggyback:1397] Out: 2445 [Urine:2295; Drains:150]   Intake/Output this shift:  Total I/O In: 402 [I.V.:402] Out: 570 [Urine:570]  CVS-regular rate murmur no murmurs rubs gallops RS-clear to auscultation ABD- BS present soft non-distended EXT- no edema   Basic Metabolic Panel: Recent Labs  Lab 07/31/20 2155 08/01/20 0217 08/01/20 0641 08/01/20 1033 08/01/20 1136 08/01/20 1252 08/01/20 1728 08/02/20 0059 08/02/20 0300 08/02/20 0400 08/02/20 0740  NA <102* 105* 104* 103*   < > 101* 106* 112* 112*  --  114*  K 3.1* 2.9* 3.1* 3.6  --   --   --   --  3.4*  --   --   CL 69* 74* 75* 75*  --   --   --   --  83*  --   --    CO2 12* 12* 14* 13*  --   --   --   --  14*  --   --   GLUCOSE 624* 227* 205* 151*  --   --   --   --  153*  --   --   BUN 106* 108* 114* 108*  --   --   --   --  113*  --   --   CREATININE 3.98* 4.13* 3.82* 3.79*  --   --   --   --  3.66*  --   --   CALCIUM 7.2* 7.6* 7.7* 7.8*  --   --   --   --  7.9*  --   --   MG  --  1.6*  --   --   --   --   --   --   --  2.6*  --   PHOS  --  6.2*  --   --   --   --   --   --   --   --   --    < > = values in this interval not displayed.    Liver Function Tests: Recent Labs  Lab 07/31/20 1913 08/01/20 0641  AST 14* 19  ALT 16 19  ALKPHOS 147* 142*  BILITOT 1.3* 0.9  PROT 5.4* 5.4*  ALBUMIN 2.0* 2.1*  Recent Labs  Lab 07/31/20 1913  LIPASE 51   Recent Labs  Lab 07/31/20 1758  AMMONIA 32    CBC: Recent Labs  Lab 07/31/20 1758 08/01/20 0641 08/02/20 0400  WBC 44.0* 49.1* 47.6*  NEUTROABS 40.1* 46.2* 43.5*  HGB 12.6 11.4* 10.4*  HCT 34.3* 30.9* 27.9*  MCV 83.1 82.2 83.3  PLT 225 206 237    Cardiac Enzymes: No results for input(s): CKTOTAL, CKMB, CKMBINDEX, TROPONINI in the last 168 hours.  BNP: Invalid input(s): POCBNP  CBG: Recent Labs  Lab 08/01/20 1609 08/01/20 2018 08/01/20 2300 08/02/20 0431 08/02/20 0719  GLUCAP 236* 124* 125* 166* 142*    Microbiology: Results for orders placed or performed during the hospital encounter of 07/31/20  Culture, blood (single)     Status: Abnormal (Preliminary result)   Collection Time: 07/31/20  5:26 PM   Specimen: BLOOD  Result Value Ref Range Status   Specimen Description   Final    BLOOD LEFT ANTECUBITAL Performed at [facility], [address] [place], [place] [address]    Special Requests   Final    BOTTLES DRAWN AEROBIC AND ANAEROBIC Blood Culture adequate volume Performed at [facility], [address], [place], [place] [address]    Culture  Setup Time   Final    GRAM NEGATIVE RODS AEROBIC AND ANEROBIC BOTTLES Gram Stain Report Called to,Read Back By and  Verified With: LARIMORE, A. 12/24 @ 0740 BEARD, S.  CRITICAL RESULT CALLED TO, READ BACK BY AND VERIFIED WITH: PHARMD  TERRY G. [id number] 1921 FCP    Culture (A)  Final    ESCHERICHIA COLI SUSCEPTIBILITIES TO FOLLOW Performed at [facility], [address] [place], [place] [address]    Report Status PENDING  Incomplete  Blood Culture ID Panel (Reflexed)     Status: Abnormal   Collection Time: 07/31/20  5:26 PM  Result Value Ref Range Status   Enterococcus faecalis NOT DETECTED NOT DETECTED Final   Enterococcus Faecium NOT DETECTED NOT DETECTED Final   Listeria monocytogenes NOT DETECTED NOT DETECTED Final   Staphylococcus species NOT DETECTED NOT DETECTED Final   Staphylococcus aureus (BCID) NOT DETECTED NOT DETECTED Final   Staphylococcus epidermidis NOT DETECTED NOT DETECTED Final   Staphylococcus lugdunensis NOT DETECTED NOT DETECTED Final   Streptococcus species NOT DETECTED NOT DETECTED Final   Streptococcus agalactiae NOT DETECTED NOT DETECTED Final   Streptococcus pneumoniae NOT DETECTED NOT DETECTED Final   Streptococcus pyogenes NOT DETECTED NOT DETECTED Final   A.calcoaceticus-baumannii NOT DETECTED NOT DETECTED Final   Bacteroides fragilis NOT DETECTED NOT DETECTED Final   Enterobacterales DETECTED (A) NOT DETECTED Final    Comment: Enterobacterales represent a large order of gram negative bacteria, not a single organism. CRITICAL RESULT CALLED TO, READ BACK BY AND VERIFIED WITH: PHARMD  TERRY G. [id number] 1921 FCP    Enterobacter cloacae complex NOT DETECTED NOT DETECTED Final   Escherichia coli DETECTED (A) NOT DETECTED Final    Comment: CRITICAL RESULT CALLED TO, READ BACK BY AND VERIFIED WITH: PHARMD  TERRY G. [id number] 1921 FCP    Klebsiella aerogenes NOT DETECTED NOT DETECTED Final   Klebsiella oxytoca NOT DETECTED NOT DETECTED Final   Klebsiella pneumoniae NOT DETECTED NOT DETECTED Final   Proteus species NOT DETECTED NOT DETECTED Final   Salmonella species  NOT DETECTED NOT DETECTED Final   Serratia marcescens NOT DETECTED NOT DETECTED Final   Haemophilus influenzae NOT DETECTED NOT DETECTED Final   Neisseria meningitidis NOT DETECTED  NOT DETECTED Final   Pseudomonas aeruginosa NOT DETECTED NOT DETECTED Final   Stenotrophomonas maltophilia NOT DETECTED NOT DETECTED Final   Candida albicans NOT DETECTED NOT DETECTED Final   Candida auris NOT DETECTED NOT DETECTED Final   Candida glabrata NOT DETECTED NOT DETECTED Final   Candida krusei NOT DETECTED NOT DETECTED Final   Candida parapsilosis NOT DETECTED NOT DETECTED Final   Candida tropicalis NOT DETECTED NOT DETECTED Final   Cryptococcus neoformans/gattii NOT DETECTED NOT DETECTED Final   CTX-M ESBL NOT DETECTED NOT DETECTED Final   Carbapenem resistance IMP NOT DETECTED NOT DETECTED Final   Carbapenem resistance KPC NOT DETECTED NOT DETECTED Final   Carbapenem resistance NDM NOT DETECTED NOT DETECTED Final   Carbapenem resist OXA 48 LIKE NOT DETECTED NOT DETECTED Final   Carbapenem resistance VIM NOT DETECTED NOT DETECTED Final    Comment: Performed at [facility], [address], [place], [place] [address]  Resp Panel by RT-PCR (Flu A&B, Covid) Nasopharyngeal Swab     Status: None   Collection Time: 07/31/20  5:39 PM   Specimen: Nasopharyngeal Swab; Nasopharyngeal(NP) swabs in vial transport medium  Result Value Ref Range Status   SARS Coronavirus 2 by RT PCR NEGATIVE NEGATIVE Final    Comment: (NOTE) SARS-CoV-2 target nucleic acids are NOT DETECTED.  The SARS-CoV-2 RNA is generally detectable in upper respiratory specimens during the acute phase of infection. The lowest concentration of SARS-CoV-2 viral copies this assay can detect is 138 copies/mL. A negative result does not preclude SARS-Cov-2 infection and should not be used as the sole basis for treatment or other patient management decisions. A negative result may occur with  improper specimen collection/handling,  submission of specimen other than nasopharyngeal swab, presence of viral mutation(s) within the areas targeted by this assay, and inadequate number of viral copies(<138 copies/mL). A negative result must be combined with clinical observations, patient history, and epidemiological information. The expected result is Negative.  Fact Sheet for Patients:  BloggerCourse.com  Fact Sheet for Healthcare Providers:  SeriousBroker.it  This test is no t yet approved or cleared by the [place] FDA and  has been authorized for detection and/or diagnosis of SARS-CoV-2 by FDA under an Emergency Use Authorization (EUA). This EUA will remain  in effect (meaning this test can be used) for the duration of the COVID-19 declaration under Section 564(b)(1) of the Act, 21 U.S.C.section 360bbb-3(b)(1), unless the authorization is terminated  or revoked sooner.       Influenza A by PCR NEGATIVE NEGATIVE Final   Influenza B by PCR NEGATIVE NEGATIVE Final    Comment: (NOTE) The Xpert Xpress SARS-CoV-2/FLU/RSV plus assay is intended as an aid in the diagnosis of influenza from Nasopharyngeal swab specimens and should not be used as a sole basis for treatment. Nasal washings and aspirates are unacceptable for Xpert Xpress SARS-CoV-2/FLU/RSV testing.  Fact Sheet for Patients: BloggerCourse.com  Fact Sheet for Healthcare Providers: SeriousBroker.it  This test is not yet approved or cleared by the [place] FDA and has been authorized for detection and/or diagnosis of SARS-CoV-2 by FDA under an Emergency Use Authorization (EUA). This EUA will remain in effect (meaning this test can be used) for the duration of the COVID-19 declaration under Section 564(b)(1) of the Act, 21 U.S.C. section 360bbb-3(b)(1), unless the authorization is terminated or revoked.  Performed at [facility], [address], [place], [place] [address]   Culture, blood (single)     Status: None (Preliminary result)  Collection Time: 07/31/20  8:23 PM   Specimen: BLOOD RIGHT WRIST  Result Value Ref Range Status   Specimen Description BLOOD RIGHT WRIST  Final   Special Requests   Final    BOTTLES DRAWN AEROBIC AND ANAEROBIC Blood Culture results may not be optimal due to an excessive volume of blood received in culture bottles   Culture   Final    NO GROWTH 2 DAYS Performed at [facility], [address] [place], [place] [address]    Report Status PENDING  Incomplete  MRSA PCR Screening     Status: None   Collection Time: 08/01/20  3:26 PM   Specimen: Nasopharyngeal  Result Value Ref Range Status   MRSA by PCR NEGATIVE NEGATIVE Final    Comment:        The GeneXpert MRSA Assay (FDA approved for NASAL specimens only), is one component of a comprehensive MRSA colonization surveillance program. It is not intended to diagnose MRSA infection nor to guide or monitor treatment for MRSA infections. Performed at [facility], [address] [place], [place] [address]   Aerobic/Anaerobic Culture (surgical/deep wound)     Status: None (Preliminary result)   Collection Time: 08/01/20  7:37 PM   Specimen: Urine, Catheterized  Result Value Ref Range Status   Specimen Description   Final    URINE, CATHETERIZED URINE FROM DRAIN Performed at [facility], [address] [place], [place] [address]    Special Requests   Final    NONE Performed at [facility], [address] [place], [place] [address]    Gram Stain   Final    ABUNDANT WBC PRESENT, PREDOMINANTLY PMN RARE GRAM NEGATIVE RODS RARE GRAM POSITIVE RODS Performed at [facility], [address] [place], [place] [address]    Culture PENDING  Incomplete   Report Status PENDING  Incomplete  Aerobic/Anaerobic Culture (surgical/deep wound)     Status: None (Preliminary result)    Collection Time: 08/01/20  7:38 PM   Specimen: Abscess  Result Value Ref Range Status   Specimen Description   Final    ABSCESS Performed at [facility], [address] [place], [place] [address]    Special Requests   Final    NONE Performed at [facility], [address] [place], [place] [address]    Gram Stain   Final    ABUNDANT WBC PRESENT, PREDOMINANTLY PMN MODERATE GRAM NEGATIVE RODS Performed at [facility], [address] [place], [place] [address]    Culture PENDING  Incomplete   Report Status PENDING  Incomplete    Coagulation Studies: No results for input(s): LABPROT, INR in the last 72 hours.  Urinalysis: Recent Labs    08/01/20 1200  COLORURINE YELLOW  LABSPEC 1.011  PHURINE 5.0  GLUCOSEU 50*  HGBUR LARGE*  BILIRUBINUR NEGATIVE  KETONESUR NEGATIVE  PROTEINUR 30*  NITRITE NEGATIVE  LEUKOCYTESUR MODERATE*      Imaging: CT ABDOMEN PELVIS WO CONTRAST  Result Date: 08/01/2020 CLINICAL DATA:  Abnormal chest radiograph, lung nodule, generalized abdominal pain and distension, confusion, no medical care in years EXAM: CT CHEST, ABDOMEN AND PELVIS WITHOUT CONTRAST TECHNIQUE: Multidetector CT imaging of the chest, abdomen and pelvis was performed following the standard protocol without IV contrast. IV contrast not administered due to renal dysfunction. Sagittal and coronal MPR images reconstructed from axial data set. No oral contrast administered. COMPARISON:  Chest radiograph 07/31/2020 FINDINGS: CT CHEST  FINDINGS Cardiovascular: Aneurysmal dilatation of ascending thoracic aorta 4.1 cm transverse image 24. Minimal pericardial fluid. Heart otherwise unremarkable. Mediastinum/Nodes: Questionable thickening of esophagus versus artifact from underdistention. No thoracic adenopathy identified. Base of cervical region normal appearance. Lungs/Pleura: Multiple BILATERAL pulmonary nodules up to 12 mm diameter. A more confluent  area of opacity is seen in the posterior segment of the RIGHT upper lobe, 4.0 x 3.0 cm. Some of the nodules appear ill-defined and slightly shaggy a. These could represent metastatic foci or septic emboli. No definite cavitary lesions are seen. Dependent atelectasis LEFT lower lobe. No pulmonary infiltrate, pleural effusion, or pneumothorax. Musculoskeletal: Scattered endplate spur formation thoracic spine. No sclerotic or destructive osseous lesions. CT ABDOMEN PELVIS FINDINGS Hepatobiliary: Gallbladder and liver normal appearance Pancreas: Atrophic, otherwise unremarkable Spleen: Normal appearance Adrenals/Urinary Tract: Adrenal glands normal appearance. Minimal collecting system dilatation RIGHT kidney without mass or calcification. RIGHT ureter decompressed. Abnormal appearance of LEFT kidney, which is diffusely enlarged and demonstrates significant perinephric edema. In addition, significant gas is seen in the perinephric space and questionably subcapsular, minimally in the collecting system, consistent with emphysematous pyelonephritis. Edema/fluid is seen extending along the lateral conal fascia and the anterior and posterior pararenal fascia. A gas and fluid collection is identified inferior to the LEFT kidney, 6.4 x 3.6 x 5.9 cm consistent with abscess. 4 mm calculus at LEFT ureteropelvic junction with mild LEFT hydronephrosis. Small amount of air within bladder though this could be related to prior catheterization. Stomach/Bowel: Stomach and bowel loops normal appearance. Edema from the LEFT renal process extends to the lateral conal fascia and adjacent to the descending colon but the descending colon does not appear to be thickened or involved. Vascular/Lymphatic: Minimal atherosclerotic calcification aorta. Aorta normal caliber. No definite adenopathy. Reproductive: Unremarkable uterus and adnexa Other: No free air or free fluid. LEFT retroperitoneal edema the posterior pararenal space extending into  upper pelvis. No hernia. Musculoskeletal: No acute osseous findings. IMPRESSION: Multiple BILATERAL pulmonary nodules up to 12 mm diameter, some of which appear ill-defined and slightly shaggy. These could represent metastatic foci or septic emboli. Correlation with cardiology recommended to exclude source of septic embolism. 4 mm LEFT UPJ calculus with mild LEFT hydronephrosis and hydroureter. 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 Recommend annual imaging followup by CTA or MRA. This recommendation follows 2010 ACCF/AHA/AATS/ACR/ASA/SCA/SCAI/SIR/STS/SVM Guidelines for the Diagnosis and Management of Patients with Thoracic Aortic Disease. Circulation. 2010; 121: Z610-R604. Aortic aneurysm NOS (ICD10-I71.9) Aortic Atherosclerosis (ICD10-I70.0) Findings discussed with Dr. Henreitta Leber at time of exam on 08/01/2020. Electronically Signed   By: Ulyses Southward M.D.   On: 08/01/2020 11:34   CT HEAD WO CONTRAST  Result Date: 08/01/2020 CLINICAL DATA:  Disorientation, abnormal chest radiograph with pulmonary nodules, hyponatremia EXAM: CT HEAD WITHOUT CONTRAST TECHNIQUE: Contiguous axial images were obtained from the base of the skull through the vertex without intravenous contrast. Sagittal and coronal MPR images reconstructed from axial data set. COMPARISON:  None FINDINGS: Brain: Motion artifacts on initial imaging, for which repeat imaging was performed. Normal ventricular morphology. No midline shift or mass effect. Normal appearance of brain parenchyma. No intracranial hemorrhage, mass lesion, or evidence of acute infarction. Vascular: No hyperdense vessels Skull: Intact Sinuses/Orbits: Clear Other: N/A IMPRESSION: No acute intracranial abnormalities. Electronically Signed   By: Ulyses Southward M.D.   On: 08/01/2020 11:36   CT CHEST  WO  CONTRAST  Result Date: 08/01/2020 CLINICAL DATA:  Abnormal chest radiograph, lung nodule, generalized abdominal pain and distension, confusion, no medical care in years EXAM: CT CHEST, ABDOMEN AND PELVIS WITHOUT CONTRAST TECHNIQUE: Multidetector CT imaging of the chest, abdomen and pelvis was performed following the standard protocol without IV contrast. IV contrast not administered due to renal dysfunction. Sagittal and coronal MPR images reconstructed from axial data set. No oral contrast administered. 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 Minimal pericardial fluid. Heart otherwise unremarkable. Mediastinum/Nodes: Questionable thickening of esophagus versus artifact from underdistention. No thoracic adenopathy identified. Base of cervical region normal appearance. Lungs/Pleura: Multiple BILATERAL pulmonary nodules up to 12 mm diameter. A more confluent area of opacity is seen in the posterior segment of the RIGHT upper lobe, 4.0 x 3.0 cm. Some of the nodules appear ill-defined and slightly shaggy a. These could represent metastatic foci or septic emboli. No definite cavitary lesions are seen. Dependent atelectasis LEFT lower lobe. No pulmonary infiltrate, pleural effusion, or pneumothorax. Musculoskeletal: Scattered endplate spur formation thoracic spine. No sclerotic or destructive osseous lesions. CT ABDOMEN PELVIS FINDINGS Hepatobiliary: Gallbladder and liver normal appearance Pancreas: Atrophic, otherwise unremarkable Spleen: Normal appearance Adrenals/Urinary Tract: Adrenal glands normal appearance. Minimal collecting system dilatation RIGHT kidney without mass or calcification. RIGHT ureter decompressed. Abnormal appearance of LEFT kidney, which is diffusely enlarged and demonstrates significant perinephric edema. In addition, significant gas is seen in the perinephric space and  questionably subcapsular, minimally in the collecting system, consistent with emphysematous pyelonephritis. Edema/fluid is seen extending along the lateral conal fascia and the anterior and posterior pararenal fascia. A gas and fluid collection is identified inferior to the LEFT kidney, 6.4 x 3.6 x 5.9 cm consistent with abscess. 4 mm calculus at LEFT ureteropelvic junction with mild LEFT hydronephrosis. Small amount of air within bladder though this could be related to prior catheterization. Stomach/Bowel: Stomach and bowel loops normal appearance. Edema from the LEFT renal process extends to the lateral conal fascia and adjacent to the descending colon but the descending colon does not appear to be thickened or involved. Vascular/Lymphatic: Minimal atherosclerotic calcification aorta. Aorta normal caliber. No definite adenopathy. Reproductive: Unremarkable uterus and adnexa Other: No free air or free fluid. LEFT retroperitoneal edema the posterior pararenal space extending into upper pelvis. No hernia. Musculoskeletal: No acute osseous findings. IMPRESSION: Multiple BILATERAL pulmonary nodules up to 12 mm diameter, some of which appear ill-defined and slightly shaggy. These could represent metastatic foci or septic emboli. Correlation with cardiology recommended to exclude source of septic embolism. 4 mm LEFT UPJ calculus with mild LEFT hydronephrosis and hydroureter. 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 Recommend annual imaging followup by CTA or MRA. This recommendation follows 2010 ACCF/AHA/AATS/ACR/ASA/SCA/SCAI/SIR/STS/SVM Guidelines for the Diagnosis and Management of Patients with Thoracic Aortic Disease. Circulation. 2010; 121: U045-W098. Aortic aneurysm NOS (ICD10-I71.9) Aortic Atherosclerosis (ICD10-I70.0) Findings  discussed with Dr. Henreitta Leber at time of exam on 08/01/2020. Electronically Signed   By: Ulyses Southward M.D.   On: 08/01/2020 11:34   DG Chest Portable 1 View  Result Date: 07/31/2020 CLINICAL DATA:  Delirium EXAM: PORTABLE CHEST 1 VIEW COMPARISON:  None. FINDINGS: The heart size and mediastinal contours are within normal limits. Elevation of left hemidiaphragm Bilateral patchy airspace opacities. No pulmonary  edema. No definite pleural effusion. No pneumothorax. No acute osseous abnormality. IMPRESSION: Multifocal airspace opacities that could represent infection or inflammation. COVID-19 infection not excluded. Followup PA and lateral chest X-ray is recommended in 3-4 weeks following therapy to ensure resolution and exclude underlying malignancy. Electronically Signed   By: Tish Frederickson M.D.   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 The antibiotics were administered within an appropriate time frame prior to the initiation of the procedure. ANESTHESIA/SEDATION: Intravenous Fentanyl and Versed  were administered as conscious sedation during continuous monitoring of the patient's level of consciousness and physiological / cardiorespiratory status by the radiology RN, with a total moderate sedation time of 42 minutes. COMPLICATIONS: None immediate. PROCEDURE: Informed written consent was obtained from the patient after a thorough discussion of the procedural risks, benefits and alternatives. All questions were addressed. Maximal Sterile Barrier Technique was utilized including caps, mask, sterile gowns, sterile gloves, sterile drape, hand hygiene and skin  antiseptic. A timeout was performed prior to the initiation of the procedure. Patient placed in right anterior oblique position and a limited axial scanning through the abdomen pelvis was performed. Appropriate skin entry sites were identified and marked, then prepped with chlorhexidine, draped in usual sterile fashion, infiltrated locally with 1% lidocaine. Under CT fluoroscopic guidance, a 21 gauge trocar needle advanced into a posterior lower pole calyx of the left renal collecting system. 018 guidewire advanced centrally without resistance, confirmed on CT. The transitional dilator was utilized to allow parallel placement of a 038 stiff Amplatz wire. Position confirmed on CT. Tract was dilated to facilitate placement 10 French pigtail drain catheter, formed centrally within the left renal collecting system. 5 mL of purulent urine were aspirated, sent for Gram stain and culture. In similar fashion, the left retroperitoneal pelvic gas and fluid collection anterior to the psoas was localized. 18 gauge trocar needle was advanced to the collection. Purulent material could be aspirated. A new sterile Amplatz wire advanced easily into the collection, position confirmed on CT. Tract dilated to facilitate placement 12 French pigtail drain catheter, formed centrally within the collection. 10 mL of purulent material were aspirated, sent for Gram stain and culture. Both catheters were secured externally with 0 Prolene sutures and StatLock devices and placed to gravity drain bags. The patient tolerated the procedure well. IMPRESSION: 1. Technically successful left nephrostomy catheter placement with CT guidance. 2. Technically successful left pelvic abscess drain catheter placement with CT guidance. 3. Aspirates from both sites were sent for Gram stain and culture. Electronically Signed   By: Corlis Leak M.D.   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 The antibiotics were administered within an appropriate time frame prior to the initiation of the procedure. ANESTHESIA/SEDATION: Intravenous Fentanyl and Versed  were administered as conscious sedation during continuous monitoring of the patient's level of consciousness and physiological / cardiorespiratory status by the radiology RN, with a total moderate sedation time of 42 minutes. COMPLICATIONS: None immediate. PROCEDURE: Informed written consent was obtained  from the patient after a thorough discussion of the procedural risks, benefits and alternatives. All questions were addressed. Maximal Sterile Barrier Technique was utilized including caps, mask, sterile gowns, sterile gloves, sterile drape, hand hygiene and skin antiseptic. A timeout was performed prior to the initiation of the procedure. Patient placed in right anterior oblique position and a limited axial scanning through the abdomen pelvis was performed. Appropriate skin entry sites were identified and marked, then prepped with chlorhexidine, draped in usual sterile fashion, infiltrated locally with 1% lidocaine. Under CT fluoroscopic guidance, a 21 gauge trocar needle advanced into a posterior lower pole calyx of the left renal collecting system. 018 guidewire advanced centrally without resistance, confirmed on CT. The transitional dilator was utilized to allow parallel placement of a 038 stiff Amplatz wire. Position confirmed on CT. Tract was dilated to facilitate placement 10 French pigtail drain catheter, formed centrally within the left renal collecting system. 5 mL of purulent urine were aspirated, sent for Gram stain and culture. In similar fashion, the left  retroperitoneal pelvic gas and fluid collection anterior to the psoas was localized. 18 gauge trocar needle was advanced to the collection. Purulent material could be aspirated. A new sterile Amplatz wire advanced easily into the collection, position confirmed on CT. Tract dilated to facilitate placement 12 French pigtail drain catheter, formed centrally within the collection. 10 mL of purulent material were aspirated, sent for Gram stain and culture. Both catheters were secured externally with 0 Prolene sutures and StatLock devices and placed to gravity drain bags. The patient tolerated the procedure well. IMPRESSION: 1. Technically successful left nephrostomy catheter placement with CT guidance. 2. Technically successful left pelvic abscess drain catheter placement with CT guidance. 3. Aspirates from both sites were sent for Gram stain and culture. Electronically Signed   By: Corlis Leak M.D.   On: 08/02/2020 08:25     Medications:   . cefTRIAXone (ROCEPHIN)  IV Stopped (08/01/20 2109)  . lactated ringers 100 mL/hr at 08/02/20 0955  . sodium chloride     . chlorhexidine  15 mL Mouth Rinse BID  . Chlorhexidine Gluconate Cloth  6 each Topical Daily  . enoxaparin (LOVENOX) injection  30 mg Subcutaneous Q24H  . insulin aspart  0-15 Units Subcutaneous Q4H  . insulin glargine  20 Units Subcutaneous BID  . mouth rinse  15 mL Mouth Rinse q12n4p  . sodium chloride flush  5 mL Intracatheter Q8H   acetaminophen **OR** acetaminophen, albuterol, dextrose, hydrALAZINE, ondansetron **OR** ondansetron (ZOFRAN) IV  Assessment/ Plan:   Acute kidney injury.  There are no prior creatinines.  It appears that she had hydronephrosis and emphysematous pyelonephritis setting of sepsis.  I would imagine ischemic ATN would be  high on the list of differentials.  Urine sediment appears to have 21-50 WBCs no RBCs per high-powered field.  Hyponatremia patient was started on IV 3% saline at 50 cc an hour.  Appears to have  had a brisk response with an increase in serum sodium to 113.  Urine osmolality 279 urine sodium 12.  I would imagine hyponatremia secondary to volume depletion.  I would recommend discontinuation of the 3% saline conversion to a isotonic solution.  We will choose a rate of 100 cc an hour  Metabolic acidosis setting of sepsis and renal failure.  With use 3 A of bicarbonate D5W.  Sepsis   continues on ceftriaxone  Multiple pulmonary nodules differential would include metastatic malignancy followed by critical care medicine.   LOS: 2 Link Snuffer  Marcee Jacobs  :32 AM

## 2020-08-02 NOTE — Progress Notes (Signed)
NAME:  Tammie [name], MRN:  [id number], DOB:  05/08/1961, LOS: 2 ADMISSION DATE:  07/31/2020, CONSULTATION DATE:  12/24REFERRING MD:  Laural BenesJohnson, CHIEF COMPLAINT:  Sepsis/hyponatremia/pyelonephritis w/ abscess   Brief History:  59 year old female admitted 12/24 with chief complaint of abdominal pain.  Found to have acute left pyelonephrosis with abscess, severe sepsis, acute kidney injury, hyper osmolar nonketotic hyperglycemia, severe hyponatremia  -CT imaging showed multiple pulmonary nodules, imaging of abdomen pelvis With left hydronephrosis and evidence of perinephritic abscess Was  [facility] for placement of left nephrostomy tube and left perirenal drain Critical care asked to admit given severe hyponatremia and concern for clinical decompensation  History of Present Illness:  59 year old female patient who presented to [facility] emergency room initially on 12/24 in the early a.m. hours with chief complaint of abdominal discomfort.  This had been ongoing over a 3-day history, and was associated with decreased oral intake, nausea, and feeling lightheaded.  She noted that the nausea had been going on for about 2 weeks, but had had multiple episodes of emesis and diarrhea for the 3 days prior to presentation.  She denied melena, or hematochezia.  She had poor poor p.o. intake.  Had been reporting significant thirst.  In the ER she was found to be normotensive, afebrile.  Vital signs were stable.  She did have significant leukocytosis with white blood cell count of 44, slightly elevated lactic acid, and chest x-ray showing multifocal airspace disease concerning for possible infection' her initial blood glucose was 706.  Her corrected sodium for this was roughly 116.  She was admitted to the internal medicine service with working diagnosis of multifocal pneumonia, hyperosmolar hyperglycemic state, acute kidney injury, and hyponatremia.  She was placed on broad-spectrum antibiotics.  She was  found to have a significantly distended abdomen because of this CT abdomen and pelvis was ordered.  General surgery were consulted.  Because of diffuse airspace disease a CT chest was obtained, primary and consulting team a little concerned about possible malignancy. The CT abdomen pelvis was resulted this showed multiple and bilateral pulmonary nodules which were ill-defined.  There was no acute pulmonary emboli.  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  In regards to her CT chest as noted she had bilateral multiple pulmonary nodules all of which were ill-defined.  CT of head was without intracranial hemorrhage, mass lesion or evidence of infarct.  Nephrology was consulted because of severe hyponatremia and acute kidney injury.  Because her hyponatremia was felt to be symptomatic and severe patient was started on 3% saline at 50 mL an hour, with initial goal sodium of 108.  Because of her severe hyponatremia, and concern for clinical deterioration she was asked to [facility] under the critical care service Past Medical History:   Hypertension, class I obesity  Significant Hospital Events:  12/24 admitted with chief complaint of abdominal pain.  Diagnostic imaging from CT abdomen pelvis showing left hydronephrosis, pyelonephritis, and perinephritic abscess.  Blood cultures preliminarily showing gram-negative rods, CT chest with multiple pulmonary nodules.Presenting blood glucose 706, presenting sodium 1 less than 102.  Beta hydroxybutyric acid was negative.  Was given IV hydration.  Started on  empiric antibiotics.  Transferred to [facility] following  urology consultation recommending left nephrostomy tube and perinephritic drain.  Critical care asked to admit  Consults:  Urology Nephrology General surgery at any [facility]  Procedures:  12/24 IR >> CT guided perc nephrostomy ,CT guided L pelvic abscess drain placement   Significant Diagnostic Tests:  CT chest abdomen and pelvis 12/24: Multiple bilateral pulmonary nodules up to 12 mm in diameter, they are ill-defined and shaggy in appearance 4 mm left UPJ calculus with mild left hydronephrosis and hydroureter the left kidney is emphysematous with a 6.4 x 3.6 x 5.9 diameter abscess CT head: Negative  Micro Data:  Urine culture 12/23 Blood culture x2 was 12/23: E coli  Abscess cx 12/24 >> Respiratory panel by PCR 12/23: Negative for Covid and influenza.  Antimicrobials:  Azithromycin and ceftriaxone 12/23 for 1 dose Zosyn 12/24>> 12/25 12/25 ceftx >>  Interim History / Subjective:   Critically ill, tolerated IR procedure well. Afebrile. Sodium improved from 10 2-1 1 4  this morning. 3% saline stopped at 3 AM   Objective   Blood pressure 129/62, pulse 72, temperature 98 F (36.7 C), temperature source Axillary, resp. rate (!) 22, height 5\' 2"  (1.575 m), weight 81.1 kg, SpO2 97 %.        Intake/Output Summary (Last 24 hours) at 08/02/2020 08/04/2020 Last data filed at 08/02/2020 0800 Gross per 24 hour  Intake 1931.72 ml  Output 1470 ml  Net 461.72 ml   Filed Weights   07/31/20 1702 08/01/20 0000  Weight: 79.4 kg 81.1 kg    Examination: General: Middle-aged obese woman in no distress, able to lie supine HENT: Mild pallor, no icterus, no JVD, dry mucosa Lungs: Bilateral decreased breath sounds, no accessory muscle use Cardiovascular: S1-S2 tacky Abdomen: Soft distended, nontender, no guarding Extremities: No mottling, good peripheral pulses Neuro: Follows one-step commands, no meningeal signs, moves all 4 extremities   Chest x-ray independently reviewed which shows  multifocal opacities. Labs show increase in sodium to 114, mild hypokalemia, slight decrease in creatinine, leukocytosis remains high with left shift, stable anemia  Resolved Hospital Problem list     Assessment & Plan:   Severe sepsis with E. coli bacteremia in the setting of left pyelonephritis/hydronephrosis with perinephritic abscess -Appears improving status post PERC drains Plan Continue ceftriaxone Hold home antihypertensives   Hyperosmolar nonketotic hyperglycemia Initial blood glucose in the 700s, has nonanion gap metabolic acidosis and mild elevation of beta hydroxybutyric acid Plan Continue IV hydration Decrease Lantus 20 units every 12 expect insulin requirements to decrease as the infection comes under control   Acute kidney injury.  Underlying renal function unknown.  Has not had medical care for about 30 years Likely at least somewhat exacerbated by left pyelonephritis with abscess Plan Continuing volume resuscitation    severe hyponatremia Improved appropriately with 3% saline which was stopped at 3 AM Plan We will continue LR at 100 an hour with every 4 hours sodium monitoring  goal will be for rise of 10-12 mEq over next 24 hours so aim for 125 or lower range  Hypokalemia will be repleted   Multiple pulmonary nodules. Differential includes metastatic malignancy especially if primary renal malignancy in play Doubt septic emboli from E. coli Plan May need biopsy eventually, once acute issues resolve     Best practice (evaluated daily)  Diet: N.p.o. Pain/Anxiety/Delirium protocol (if indicated): Not indicated VAP protocol (if indicated): Not indicated DVT prophylaxis: Subcutaneous heparin GI prophylaxis: PPI Glucose control: SSI Mobility: Bedrest  Disposition: ICU  Goals of Care:   Code Status: full  Labs   CBC: Recent Labs  Lab 07/31/20 1758 08/01/20 0641 08/02/20 0400  WBC 44.0* 49.1* 47.6*  NEUTROABS 40.1* 46.2* 43.5*  HGB 12.6  11.4* 10.4*  HCT 34.3* 30.9* 27.9*  MCV 83.1 82.2 83.3  PLT 225 206 237    Basic Metabolic Panel: Recent Labs  Lab 07/31/20 2155 08/01/20 0217 08/01/20 0641 08/01/20 1033 08/01/20 1136 08/01/20 1252 08/01/20 1728 08/02/20 0059 08/02/20 0300 08/02/20 0400 08/02/20 0740  NA <102* 105* 104* 103*   < > 101* 106* 112* 112*  --  114*  K 3.1* 2.9* 3.1* 3.6  --   --   --   --  3.4*  --   --   CL 69* 74* 75* 75*  --   --   --   --  83*  --   --   CO2 12* 12* 14* 13*  --   --   --   --  14*  --   --   GLUCOSE 624* 227* 205* 151*  --   --   --   --  153*  --   --   BUN 106* 108* 114* 108*  --   --   --   --  113*  --   --   CREATININE 3.98* 4.13* 3.82* 3.79*  --   --   --   --  3.66*  --   --   CALCIUM 7.2* 7.6* 7.7* 7.8*  --   --   --   --  7.9*  --   --   MG  --  1.6*  --   --   --   --   --   --   --  2.6*  --   PHOS  --  6.2*  --   --   --   --   --   --   --   --   --    < > = values in this interval not displayed.   GFR: Estimated Creatinine Clearance: 16.3 mL/min (A) (by C-G formula based on SCr of 3.66 mg/dL (H)). Recent Labs  Lab 07/31/20 1758 07/31/20 1913 08/01/20 0217 08/01/20 0641 08/02/20 0400  PROCALCITON  --   --  >150.00  --   --   WBC 44.0*  --   --  49.1* 47.6*  LATICACIDVEN 2.0* 1.9  --   --   --     Liver Function Tests: Recent Labs  Lab 07/31/20 1913 08/01/20 0641  AST 14* 19  ALT 16 19  ALKPHOS 147* 142*  BILITOT 1.3* 0.9  PROT 5.4* 5.4*  ALBUMIN 2.0* 2.1*   Recent Labs  Lab 07/31/20 1913  LIPASE 51   Recent Labs  Lab 07/31/20 1758  AMMONIA 32    ABG    Component Value Date/Time   HCO3 16.7 (L) 08/01/2020 0217   ACIDBASEDEF 10.9 (H) 08/01/2020 0217   O2SAT 95.0 08/01/2020 0217     Coagulation Profile: No results for input(s): INR, PROTIME in the last 168 hours.  Cardiac Enzymes: No results for input(s): CKTOTAL, CKMB, CKMBINDEX, TROPONINI in the last 168 hours.  HbA1C: Hgb A1c MFr Bld  Date/Time Value Ref Range Status   08/01/2020 02:17 AM 12.2 (H) 4.8 - 5.6 % Final    Comment:    (NOTE) Pre diabetes:          5.7%-6.4%  Diabetes:              >  6.4%  Glycemic control for   <7.0% adults with diabetes     CBG: Recent Labs  Lab 08/01/20 1609 08/01/20 2018 08/01/20 2300 08/02/20 0431 08/02/20 0719  GLUCAP 236* 124* 125* 166* 142*     Patient critically ill due to E. coli sepsis, hyponatremia, AKI Interventions to address this today sodium correction with monitoring, fluids and antibiotics Risk of deterioration without these interventions is high  I personally spent 32 minutes providing critical care not including any separately billable procedures   Cyril Mourning MD. FCCP. [facility] See Amion for pager  If no response to pager , please call [phone]  After 7:00 pm call Elink  [phone]   08/02/2020

## 2020-08-02 NOTE — Progress Notes (Signed)
Subjective: Underwent L PCN and perinephric drain placement last night. Afebrile. Normotensive. Arousable. Pain seems well controlled.  Objective: Vital signs in last 24 hours: Temp:  [98 F (36.7 C)-98.6 F (37 C)] 98 F (36.7 C) (12/25 0800) Pulse Rate:  [71-88] 76 (12/25 1000) Resp:  [15-28] 18 (12/25 1000) BP: (97-185)/(43-106) 128/57 (12/25 1000) SpO2:  [80 %-98 %] 90 % (12/25 1000) Arterial Line BP: (74-199)/(34-91) 146/53 (12/25 1000)  Intake/Output from previous day: 12/24 0701 - 12/25 0700 In: 1674.7 [I.V.:1127.8; IV Piggyback:547] Out: 3154 [MGQQP:6195; Drains:150] Intake/Output this shift: Total I/O In: 402 [I.V.:402] Out: 570 [Urine:570]   Foley: 1.5L clear yellow PCN: 42ml pink tinge Perinephric drain: bloody purulent  Physical Exam:  General: Arousable CV: RRR Lungs: Clear Abdomen: Soft, ND, ATTP in left flank; L CVA tenderness Ext: NT, No erythema  Lab Results: Recent Labs    07/31/20 1758 08/01/20 0641 08/02/20 0400  HGB 12.6 11.4* 10.4*  HCT 34.3* 30.9* 27.9*   BMET Recent Labs    08/01/20 1033 08/01/20 1136 08/02/20 0300 08/02/20 0740  NA 103*   < > 112* 114*  K 3.6  --  3.4*  --   CL 75*  --  83*  --   CO2 13*  --  14*  --   GLUCOSE 151*  --  153*  --   BUN 108*  --  113*  --   CREATININE 3.79*  --  3.66*  --   CALCIUM 7.8*  --  7.9*  --    < > = values in this interval not displayed.     Studies/Results: CT ABDOMEN PELVIS WO CONTRAST  Result Date: 08/01/2020 CLINICAL DATA:  Abnormal chest radiograph, lung nodule, generalized abdominal pain and distension, confusion, no medical care in years EXAM: CT CHEST, ABDOMEN AND PELVIS WITHOUT CONTRAST TECHNIQUE: Multidetector CT imaging of the chest, abdomen and pelvis was performed following the standard protocol without IV contrast. IV contrast not administered due to renal dysfunction. Sagittal and coronal MPR images reconstructed from axial data set. No oral contrast  administered. 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 Minimal pericardial fluid. Heart otherwise unremarkable. Mediastinum/Nodes: Questionable thickening of esophagus versus artifact from underdistention. No thoracic adenopathy identified. Base of cervical region normal appearance. Lungs/Pleura: Multiple BILATERAL pulmonary nodules up to 12 mm diameter. A more confluent area of opacity is seen in the posterior segment of the RIGHT upper lobe, 4.0 x 3.0 cm. Some of the nodules appear ill-defined and slightly shaggy a. These could represent metastatic foci or septic emboli. No definite cavitary lesions are seen. Dependent atelectasis LEFT lower lobe. No pulmonary infiltrate, pleural effusion, or pneumothorax. Musculoskeletal: Scattered endplate spur formation thoracic spine. No sclerotic or destructive osseous lesions. CT ABDOMEN PELVIS FINDINGS Hepatobiliary: Gallbladder and liver normal appearance Pancreas: Atrophic, otherwise unremarkable Spleen: Normal appearance Adrenals/Urinary Tract: Adrenal glands normal appearance. Minimal collecting system dilatation RIGHT kidney without mass or calcification. RIGHT ureter decompressed. Abnormal appearance of LEFT kidney, which is diffusely enlarged and demonstrates significant perinephric edema. In addition, significant gas is seen in the perinephric space and questionably subcapsular, minimally in the collecting system, consistent with emphysematous pyelonephritis. Edema/fluid is seen extending along the lateral conal fascia and the anterior and posterior pararenal fascia. A gas and fluid collection is identified inferior to the LEFT kidney, 6.4 x 3.6 x 5.9 cm consistent with abscess. 4 mm calculus at LEFT ureteropelvic junction with mild  LEFT hydronephrosis. Small amount of air within bladder though this could be related to prior catheterization.  Stomach/Bowel: Stomach and bowel loops normal appearance. Edema from the LEFT renal process extends to the lateral conal fascia and adjacent to the descending colon but the descending colon does not appear to be thickened or involved. Vascular/Lymphatic: Minimal atherosclerotic calcification aorta. Aorta normal caliber. No definite adenopathy. Reproductive: Unremarkable uterus and adnexa Other: No free air or free fluid. LEFT retroperitoneal edema the posterior pararenal space extending into upper pelvis. No hernia. Musculoskeletal: No acute osseous findings. IMPRESSION: Multiple BILATERAL pulmonary nodules up to 12 mm diameter, some of which appear ill-defined and slightly shaggy. These could represent metastatic foci or septic emboli. Correlation with cardiology recommended to exclude source of septic embolism. 4 mm LEFT UPJ calculus with mild LEFT hydronephrosis and hydroureter. 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 Recommend annual imaging followup by CTA or MRA. This recommendation follows 2010 ACCF/AHA/AATS/ACR/ASA/SCA/SCAI/SIR/STS/SVM Guidelines for the Diagnosis and Management of Patients with Thoracic Aortic Disease. Circulation. 2010; 121: U981-X914: E266-e369. Aortic aneurysm NOS (ICD10-I71.9) Aortic Atherosclerosis (ICD10-I70.0) Findings discussed with Dr. Henreitta LeberBridges at time of exam on 08/01/2020. Electronically Signed   By: Ulyses SouthwardMark  Boles M.D.   On: 08/01/2020 11:34   CT HEAD WO CONTRAST  Result Date: 08/01/2020 CLINICAL DATA:  Disorientation, abnormal chest radiograph with pulmonary nodules, hyponatremia EXAM: CT HEAD WITHOUT CONTRAST TECHNIQUE: Contiguous axial images were obtained from the base of the skull through the vertex without intravenous contrast. Sagittal and coronal MPR images reconstructed from axial data set.  COMPARISON:  None FINDINGS: Brain: Motion artifacts on initial imaging, for which repeat imaging was performed. Normal ventricular morphology. No midline shift or mass effect. Normal appearance of brain parenchyma. No intracranial hemorrhage, mass lesion, or evidence of acute infarction. Vascular: No hyperdense vessels Skull: Intact Sinuses/Orbits: Clear Other: N/A IMPRESSION: No acute intracranial abnormalities. Electronically Signed   By: Ulyses SouthwardMark  Boles M.D.   On: 08/01/2020 11:36   CT CHEST WO CONTRAST  Result Date: 08/01/2020 CLINICAL DATA:  Abnormal chest radiograph, lung nodule, generalized abdominal pain and distension, confusion, no medical care in years EXAM: CT CHEST, ABDOMEN AND PELVIS WITHOUT CONTRAST TECHNIQUE: Multidetector CT imaging of the chest, abdomen and pelvis was performed following the standard protocol without IV contrast. IV contrast not administered due to renal dysfunction. Sagittal and coronal MPR images reconstructed from axial data set. No oral contrast administered. 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 Minimal pericardial fluid. Heart otherwise unremarkable. Mediastinum/Nodes: Questionable thickening of esophagus versus artifact from underdistention. No thoracic adenopathy identified. Base of cervical region normal appearance. Lungs/Pleura: Multiple BILATERAL pulmonary nodules up to 12 mm diameter. A more confluent area of opacity is seen in the posterior segment of the RIGHT upper lobe, 4.0 x 3.0 cm. Some of the nodules appear ill-defined and slightly shaggy a. These could represent metastatic foci or septic emboli. No definite cavitary lesions are seen. Dependent atelectasis LEFT lower lobe. No pulmonary infiltrate, pleural effusion, or pneumothorax. Musculoskeletal: Scattered endplate spur formation thoracic spine. No sclerotic or destructive osseous  lesions. CT ABDOMEN PELVIS FINDINGS Hepatobiliary: Gallbladder and liver normal appearance Pancreas: Atrophic, otherwise unremarkable Spleen: Normal appearance Adrenals/Urinary Tract: Adrenal glands normal appearance. Minimal collecting system dilatation RIGHT kidney without mass or calcification. RIGHT ureter  decompressed. Abnormal appearance of LEFT kidney, which is diffusely enlarged and demonstrates significant perinephric edema. In addition, significant gas is seen in the perinephric space and questionably subcapsular, minimally in the collecting system, consistent with emphysematous pyelonephritis. Edema/fluid is seen extending along the lateral conal fascia and the anterior and posterior pararenal fascia. A gas and fluid collection is identified inferior to the LEFT kidney, 6.4 x 3.6 x 5.9 cm consistent with abscess. 4 mm calculus at LEFT ureteropelvic junction with mild LEFT hydronephrosis. Small amount of air within bladder though this could be related to prior catheterization. Stomach/Bowel: Stomach and bowel loops normal appearance. Edema from the LEFT renal process extends to the lateral conal fascia and adjacent to the descending colon but the descending colon does not appear to be thickened or involved. Vascular/Lymphatic: Minimal atherosclerotic calcification aorta. Aorta normal caliber. No definite adenopathy. Reproductive: Unremarkable uterus and adnexa Other: No free air or free fluid. LEFT retroperitoneal edema the posterior pararenal space extending into upper pelvis. No hernia. Musculoskeletal: No acute osseous findings. IMPRESSION: Multiple BILATERAL pulmonary nodules up to 12 mm diameter, some of which appear ill-defined and slightly shaggy. These could represent metastatic foci or septic emboli. Correlation with cardiology recommended to exclude source of septic embolism. 4 mm LEFT UPJ calculus with mild LEFT hydronephrosis and hydroureter. Significant emphysematous pyelonephritis changes  involving the LEFT kidney with a 6.4 x 3.6 x 5.9 cm diameter abscess collection inferior to the LEFT kidney. 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 Recommend annual imaging followup by CTA or MRA. This recommendation follows 2010 ACCF/AHA/AATS/ACR/ASA/SCA/SCAI/SIR/STS/SVM Guidelines for the Diagnosis and Management of Patients with Thoracic Aortic Disease. Circulation. 2010; 121: Y301-S010. Aortic aneurysm NOS (ICD10-I71.9) Aortic Atherosclerosis (ICD10-I70.0) Findings discussed with Dr. Henreitta Leber at time of exam on 08/01/2020. Electronically Signed   By: Ulyses Southward M.D.   On: 08/01/2020 11:34   DG Chest Portable 1 View  Result Date: 07/31/2020 CLINICAL DATA:  Delirium EXAM: PORTABLE CHEST 1 VIEW COMPARISON:  None. FINDINGS: The heart size and mediastinal contours are within normal limits. Elevation of left hemidiaphragm Bilateral patchy airspace opacities. No pulmonary edema. No definite pleural effusion. No pneumothorax. No acute osseous abnormality. IMPRESSION: Multifocal airspace opacities that could represent infection or inflammation. COVID-19 infection not excluded. Followup PA and lateral chest X-ray is recommended in 3-4 weeks following therapy to ensure resolution and exclude underlying malignancy. Electronically Signed   By: Tish Frederickson M.D.   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 The antibiotics were administered within an appropriate time frame prior to the initiation of the procedure. ANESTHESIA/SEDATION: Intravenous Fentanyl and Versed 1mg  were administered as conscious sedation during continuous  monitoring of the patient's level of consciousness and physiological / cardiorespiratory status by the radiology RN, with a total moderate sedation time of 42 minutes. COMPLICATIONS: None immediate. PROCEDURE: Informed written consent was obtained from the patient after a thorough discussion of the procedural risks, benefits and alternatives. All questions were addressed. Maximal Sterile Barrier Technique was utilized including caps, mask, sterile gowns, sterile gloves, sterile drape, hand hygiene and skin antiseptic. A timeout was performed prior to the initiation of the procedure. Patient placed in right anterior oblique position and a limited axial scanning through the abdomen pelvis was  performed. Appropriate skin entry sites were identified and marked, then prepped with chlorhexidine, draped in usual sterile fashion, infiltrated locally with 1% lidocaine. Under CT fluoroscopic guidance, a 21 gauge trocar needle advanced into a posterior lower pole calyx of the left renal collecting system. 018 guidewire advanced centrally without resistance, confirmed on CT. The transitional dilator was utilized to allow parallel placement of a 038 stiff Amplatz wire. Position confirmed on CT. Tract was dilated to facilitate placement 10 French pigtail drain catheter, formed centrally within the left renal collecting system. 5 mL of purulent urine were aspirated, sent for Gram stain and culture. In similar fashion, the left retroperitoneal pelvic gas and fluid collection anterior to the psoas was localized. 18 gauge trocar needle was advanced to the collection. Purulent material could be aspirated. A new sterile Amplatz wire advanced easily into the collection, position confirmed on CT. Tract dilated to facilitate placement 12 French pigtail drain catheter, formed centrally within the collection. 10 mL of purulent material were aspirated, sent for Gram stain and culture. Both catheters were secured externally with 0 Prolene  sutures and StatLock devices and placed to gravity drain bags. The patient tolerated the procedure well. IMPRESSION: 1. Technically successful left nephrostomy catheter placement with CT guidance. 2. Technically successful left pelvic abscess drain catheter placement with CT guidance. 3. Aspirates from both sites were sent for Gram stain and culture. Electronically Signed   By: Corlis Leak M.D.   On: 08/02/2020 08:25   CT IMAGE GUIDED DRAINAGE BY PERCUTANEOUS CATHETER  Result Date: 08/02/2020 INDICATION: Left emphysematous pyelonephritis with obstructing calculus and retroperitoneal abscess EXAM: CT-GUIDED LEFT NEPHROSTOMY PLACEMENT CT-GUIDED PELVIC ABSCESS DRAIN CATHETER PLACEMENT MEDICATIONS: The patient is currently admitted to the hospital and receiving intravenous antibiotics. The antibiotics were administered within an appropriate time frame prior to the initiation of the procedure. ANESTHESIA/SEDATION: Intravenous Fentanyl and Versed  were administered as conscious sedation during continuous monitoring of the patient's level of consciousness and physiological / cardiorespiratory status by the radiology RN, with a total moderate sedation time of 42 minutes. COMPLICATIONS: None immediate. PROCEDURE: Informed written consent was obtained from the patient after a thorough discussion of the procedural risks, benefits and alternatives. All questions were addressed. Maximal Sterile Barrier Technique was utilized including caps, mask, sterile gowns, sterile gloves, sterile drape, hand hygiene and skin antiseptic. A timeout was performed prior to the initiation of the procedure. Patient placed in right anterior oblique position and a limited axial scanning through the abdomen pelvis was performed. Appropriate skin entry sites were identified and marked, then prepped with chlorhexidine, draped in usual sterile fashion, infiltrated locally with 1% lidocaine. Under CT fluoroscopic guidance, a 21 gauge trocar  needle advanced into a posterior lower pole calyx of the left renal collecting system. 018 guidewire advanced centrally without resistance, confirmed on CT. The transitional dilator was utilized to allow parallel placement of a 038 stiff Amplatz wire. Position confirmed on CT. Tract was dilated to facilitate placement 10 French pigtail drain catheter, formed centrally within the left renal collecting system. 5 mL of purulent urine were aspirated, sent for Gram stain and culture. In similar fashion, the left retroperitoneal pelvic gas and fluid collection anterior to the psoas was localized. 18 gauge trocar needle was advanced to the collection. Purulent material could be aspirated. A new sterile Amplatz wire advanced easily into the collection, position confirmed on CT. Tract dilated to facilitate placement 12 French pigtail drain catheter, formed centrally within the collection. 10 mL of purulent material  were aspirated, sent for Gram stain and culture. Both catheters were secured externally with 0 Prolene sutures and StatLock devices and placed to gravity drain bags. The patient tolerated the procedure well. IMPRESSION: 1. Technically successful left nephrostomy catheter placement with CT guidance. 2. Technically successful left pelvic abscess drain catheter placement with CT guidance. 3. Aspirates from both sites were sent for Gram stain and culture. Electronically Signed   By: Corlis Leak M.D.   On: 08/02/2020 08:25    Assessment/Plan: 1. Left emphysematous pyelonephritis: CT A/P 08/01/2020 with evidence of left emphysematous pyelonephritis associated with 6.4 x 5.9 cm perirenal abscess inferior to the left kidney. S/p left PCN placement 08/01/2020.  S/p left perirenal abscess drain 08/01/2020. 2. Left UPJ stone: CT A/P 12/24 with 4 mm left UPJ calculus with mild left hydronephrosis.  S/p left PCN placement 08/01/2020. 3. Multiple bilateral pulmonary nodules: Seen on CT A/P up to 12 mm in diameter, could  represent metastatic foci or septic emboli. 4. Ascending thoracic aorta aneurysmal dilatation: Up to 4.1 cm on CT A/P 08/01/2020 5. AKI: From unknown baseline 6. Septic shock due to left emphysematous pyelonephritis with bacteriuria 7. Hyperglycemia: Initially in 700s on presentation 8. Severe hyponatremia  -S/p left PCN placement and perirenal abscess drain placement last night -Appears to be clinically improving status post decompression. -Currently afebrile, WBC 47.6, urine culture pending, blood culture with E. Coli -Creatinine 3.66, downtrending, from unknown baseline -Continue antibiotics and maximal GU decompression with drains and Foley catheter -Unclear etiology of bilateral pulmonary nodules.  No obvious mass on noncontrast CT A/P 12/24.  Would need to reimage after renal function improves with and without contrast to fully evaluate. -Discussed with critical care who are correcting her severe hyponatremia and hyperglycemia -Long discussion with patient's sister and husband regarding her critical status.  I discussed risk of mortality even with the most aggressive treatment.  I discussed that if she does not improve with drains and antibiotics alone, we would consider left nephrectomy.  However, at this point, I believe that operating on her would put her at a greater risk of mortality. -Continue to trend creatinine and watch urine output from left PCN.  May need to place left ureteral stent for maximal decompression. -Prognosis is guarded   LOS: 2 days   Matt R. Jahnaya Branscome MD 08/02/2020, 10:26 AM [facility]  Pager: [phone]

## 2020-08-02 NOTE — Progress Notes (Addendum)
eLink Physician-Brief Progress Note Patient Name: Tammie [name] DOB: 1961/04/13 MRN: [id number]   Date of Service  08/02/2020  HPI/Events of Note  Q4h sodium follow up - still not resulted. We have asked bedside RNx2 to call when it is resulted.   eICU Interventions  Will follow when notified.      Intervention Category Major Interventions: Other:  Oretha Milch 08/02/2020, 10:14 PM   11:05 pm - Called by bedside RN to place an order for a sodium since apparently, lab did not see the order from before Clarified with RN - there is an active order for q4 sodium from before , which she can see, but phlebotomy could not see For this reason, I am now asked to place another order Stat sodium ordered and discussed again, with RN, to have this sent since levels need to be followed in this patient RN will call with results   12:50 am Notified of sodium level Improving and at goal during this time  Goal around 7 am would be 124-125 so continue bicarb  I called and spoke with RN Next set of labs are due at 4 am - I have asked that FULL morning panel be sent at 4 am - CBC, BMP, mag and phos  RN confirmed that orders are in and visible and I requested we be notified when resulted  6 am: Notified BMP now Reduce bicarb to 75 cc/hour Give 40 meq IV Kcl - writing only 40 meq for now given significant AKI, but also on IV bicarb which is probably driving it down, needs to be checked serially Repeat BMP ordered for 11:30 am, may be able to stop the bicarb entirely today  Discussed with RN

## 2020-08-02 NOTE — Progress Notes (Signed)
Referring Physician(s): M.Pace  Supervising Physician: Richarda OverlieHenn, Adam  Patient Status:  [facility]  Chief Complaint:  F/u PCN and perinephric drain  Brief History:  Tammie [name] is a 59 y.o. female was [facility] to [facility] ED by her husband with a 1 week history of N/V/D and abdominal pain.   Her husband reported that she was more confused the past few days.   In the ED she was afebrile and stable vital signs.   Labs were notable for a glucose of 706, sodium level of <102, K 3.3, BUN 112, Cr 4.19, alb 2, WBC 44  CT showed= 4 mm LEFT UPJ calculus with mild LEFT hydronephrosis and hydroureter. 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  She underwent placement of perinephric drain and a percutaneous nephrostomy tube yesterday by Dr. Deanne CofferHassell.  Subjective:  Sleeping soundly. Sister at bedside.  Allergies: Codeine  Medications: Prior to Admission medications   Medication Sig Start Date End Date Taking? Authorizing Provider  acetaminophen (TYLENOL) 500 MG tablet Take 1,000 mg by mouth every 6 (six) hours as needed.   Yes [provider]  Pseudoeph-Doxylamine-DM-APAP (NYQUIL PO) Take 1 tablet by mouth daily as needed (congestion).   Yes [provider]     Vital Signs: BP (!) 120/53 (BP Location: Right Arm)   Pulse 76   Temp 98 F (36.7 C) (Axillary)   Resp 18   Ht 5\' 2"  (1.575 m)   Wt 81.1 kg   LMP  (LMP Unknown)   SpO2 94%   BMI 32.70 kg/m   Physical Exam Vitals reviewed.  Constitutional:      Comments: Not as ill appearing today. Sleeping soundly  Cardiovascular:     Rate and Rhythm: Normal rate.  Pulmonary:     Effort: Pulmonary effort is normal. No respiratory distress.  Genitourinary:    Comments: Left Perinephric and PCN drain in good position. Blood  tinged drainage in bags 150 mL output recorded Neurological:      General: No focal deficit present.     Imaging: CT ABDOMEN PELVIS WO CONTRAST  Result Date: 08/01/2020 CLINICAL DATA:  Abnormal chest radiograph, lung nodule, generalized abdominal pain and distension, confusion, no medical care in years EXAM: CT CHEST, ABDOMEN AND PELVIS WITHOUT CONTRAST TECHNIQUE: Multidetector CT imaging of the chest, abdomen and pelvis was performed following the standard protocol without IV contrast. IV contrast not administered due to renal dysfunction. Sagittal and coronal MPR images reconstructed from axial data set. No oral contrast administered. 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 Minimal pericardial fluid. Heart otherwise unremarkable. Mediastinum/Nodes: Questionable thickening of esophagus versus artifact from underdistention. No thoracic adenopathy identified. Base of cervical region normal appearance. Lungs/Pleura: Multiple BILATERAL pulmonary nodules up to 12 mm diameter. A more confluent area of opacity is seen in the posterior segment of the RIGHT upper lobe, 4.0 x 3.0 cm. Some of the nodules appear ill-defined and slightly shaggy a. These could represent metastatic foci or septic emboli. No definite cavitary lesions are seen. Dependent atelectasis LEFT lower lobe. No pulmonary infiltrate, pleural effusion, or pneumothorax. Musculoskeletal: Scattered endplate spur formation thoracic spine. No sclerotic or destructive osseous lesions. CT ABDOMEN PELVIS FINDINGS Hepatobiliary: Gallbladder and liver normal appearance Pancreas: Atrophic, otherwise unremarkable Spleen: Normal appearance Adrenals/Urinary Tract: Adrenal glands normal appearance. Minimal collecting system  dilatation RIGHT kidney without mass or calcification. RIGHT ureter decompressed. Abnormal appearance of LEFT kidney, which is diffusely enlarged and demonstrates significant perinephric  edema. In addition, significant gas is seen in the perinephric space and questionably subcapsular, minimally in the collecting system, consistent with emphysematous pyelonephritis. Edema/fluid is seen extending along the lateral conal fascia and the anterior and posterior pararenal fascia. A gas and fluid collection is identified inferior to the LEFT kidney, 6.4 x 3.6 x 5.9 cm consistent with abscess. 4 mm calculus at LEFT ureteropelvic junction with mild LEFT hydronephrosis. Small amount of air within bladder though this could be related to prior catheterization. Stomach/Bowel: Stomach and bowel loops normal appearance. Edema from the LEFT renal process extends to the lateral conal fascia and adjacent to the descending colon but the descending colon does not appear to be thickened or involved. Vascular/Lymphatic: Minimal atherosclerotic calcification aorta. Aorta normal caliber. No definite adenopathy. Reproductive: Unremarkable uterus and adnexa Other: No free air or free fluid. LEFT retroperitoneal edema the posterior pararenal space extending into upper pelvis. No hernia. Musculoskeletal: No acute osseous findings. IMPRESSION: Multiple BILATERAL pulmonary nodules up to 12 mm diameter, some of which appear ill-defined and slightly shaggy. These could represent metastatic foci or septic emboli. Correlation with cardiology recommended to exclude source of septic embolism. 4 mm LEFT UPJ calculus with mild LEFT hydronephrosis and hydroureter. 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 Recommend annual imaging followup by CTA or MRA. This recommendation follows 2010 ACCF/AHA/AATS/ACR/ASA/SCA/SCAI/SIR/STS/SVM Guidelines for the Diagnosis and Management of Patients with Thoracic Aortic Disease. Circulation. 2010; 121: Z610-R604. Aortic  aneurysm NOS (ICD10-I71.9) Aortic Atherosclerosis (ICD10-I70.0) Findings discussed with Dr. Henreitta Leber at time of exam on 08/01/2020. Electronically Signed   By: Ulyses Southward M.D.   On: 08/01/2020 11:34   CT HEAD WO CONTRAST  Result Date: 08/01/2020 CLINICAL DATA:  Disorientation, abnormal chest radiograph with pulmonary nodules, hyponatremia EXAM: CT HEAD WITHOUT CONTRAST TECHNIQUE: Contiguous axial images were obtained from the base of the skull through the vertex without intravenous contrast. Sagittal and coronal MPR images reconstructed from axial data set. COMPARISON:  None FINDINGS: Brain: Motion artifacts on initial imaging, for which repeat imaging was performed. Normal ventricular morphology. No midline shift or mass effect. Normal appearance of brain parenchyma. No intracranial hemorrhage, mass lesion, or evidence of acute infarction. Vascular: No hyperdense vessels Skull: Intact Sinuses/Orbits: Clear Other: N/A IMPRESSION: No acute intracranial abnormalities. Electronically Signed   By: Ulyses Southward M.D.   On: 08/01/2020 11:36   CT CHEST WO CONTRAST  Result Date: 08/01/2020 CLINICAL DATA:  Abnormal chest radiograph, lung nodule, generalized abdominal pain and distension, confusion, no medical care in years EXAM: CT CHEST, ABDOMEN AND PELVIS WITHOUT CONTRAST TECHNIQUE: Multidetector CT imaging of the chest, abdomen and pelvis was performed following the standard protocol without IV contrast. IV contrast not administered due to renal dysfunction. Sagittal and coronal MPR images reconstructed from axial data set. No oral contrast administered. 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 Minimal pericardial fluid. Heart otherwise unremarkable. Mediastinum/Nodes: Questionable thickening of esophagus versus artifact from underdistention. No thoracic adenopathy identified. Base of cervical  region normal appearance. Lungs/Pleura: Multiple BILATERAL pulmonary nodules up to 12 mm diameter. A more confluent area of opacity is seen in the posterior segment of  the RIGHT upper lobe, 4.0 x 3.0 cm. Some of the nodules appear ill-defined and slightly shaggy a. These could represent metastatic foci or septic emboli. No definite cavitary lesions are seen. Dependent atelectasis LEFT lower lobe. No pulmonary infiltrate, pleural effusion, or pneumothorax. Musculoskeletal: Scattered endplate spur formation thoracic spine. No sclerotic or destructive osseous lesions. CT ABDOMEN PELVIS FINDINGS Hepatobiliary: Gallbladder and liver normal appearance Pancreas: Atrophic, otherwise unremarkable Spleen: Normal appearance Adrenals/Urinary Tract: Adrenal glands normal appearance. Minimal collecting system dilatation RIGHT kidney without mass or calcification. RIGHT ureter decompressed. Abnormal appearance of LEFT kidney, which is diffusely enlarged and demonstrates significant perinephric edema. In addition, significant gas is seen in the perinephric space and questionably subcapsular, minimally in the collecting system, consistent with emphysematous pyelonephritis. Edema/fluid is seen extending along the lateral conal fascia and the anterior and posterior pararenal fascia. A gas and fluid collection is identified inferior to the LEFT kidney, 6.4 x 3.6 x 5.9 cm consistent with abscess. 4 mm calculus at LEFT ureteropelvic junction with mild LEFT hydronephrosis. Small amount of air within bladder though this could be related to prior catheterization. Stomach/Bowel: Stomach and bowel loops normal appearance. Edema from the LEFT renal process extends to the lateral conal fascia and adjacent to the descending colon but the descending colon does not appear to be thickened or involved. Vascular/Lymphatic: Minimal atherosclerotic calcification aorta. Aorta normal caliber. No definite adenopathy. Reproductive: Unremarkable uterus and  adnexa Other: No free air or free fluid. LEFT retroperitoneal edema the posterior pararenal space extending into upper pelvis. No hernia. Musculoskeletal: No acute osseous findings. IMPRESSION: Multiple BILATERAL pulmonary nodules up to 12 mm diameter, some of which appear ill-defined and slightly shaggy. These could represent metastatic foci or septic emboli. Correlation with cardiology recommended to exclude source of septic embolism. 4 mm LEFT UPJ calculus with mild LEFT hydronephrosis and hydroureter. 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 Recommend annual imaging followup by CTA or MRA. This recommendation follows 2010 ACCF/AHA/AATS/ACR/ASA/SCA/SCAI/SIR/STS/SVM Guidelines for the Diagnosis and Management of Patients with Thoracic Aortic Disease. Circulation. 2010; 121: [id number]. Aortic aneurysm NOS (ICD10-I71.9) Aortic Atherosclerosis (ICD10-I70.0) Findings discussed with Dr. Henreitta Leber at time of exam on 08/01/2020. Electronically Signed   By: Ulyses Southward M.D.   On: 08/01/2020 11:34   DG Chest Portable 1 View  Result Date: 07/31/2020 CLINICAL DATA:  Delirium EXAM: PORTABLE CHEST 1 VIEW COMPARISON:  None. FINDINGS: The heart size and mediastinal contours are within normal limits. Elevation of left hemidiaphragm Bilateral patchy airspace opacities. No pulmonary edema. No definite pleural effusion. No pneumothorax. No acute osseous abnormality. IMPRESSION: Multifocal airspace opacities that could represent infection or inflammation. COVID-19 infection not excluded. Followup PA and lateral chest X-ray is recommended in 3-4 weeks following therapy to ensure resolution and exclude underlying malignancy. Electronically Signed   By: Tish Frederickson M.D.   On: 07/31/2020 17:46   CT IMAGE GUIDED DRAINAGE BY PERCUTANEOUS  CATHETER  Result Date: 08/02/2020 INDICATION: Left emphysematous pyelonephritis with obstructing calculus and retroperitoneal abscess EXAM: CT-GUIDED LEFT NEPHROSTOMY PLACEMENT CT-GUIDED PELVIC ABSCESS DRAIN CATHETER PLACEMENT MEDICATIONS: The patient is currently admitted to the hospital and receiving intravenous antibiotics. The antibiotics were administered within an appropriate time frame prior to the initiation of the procedure. ANESTHESIA/SEDATION: Intravenous Fentanyl and Versed 1mg  were administered as conscious sedation during continuous monitoring of the patient's level of consciousness and  physiological / cardiorespiratory status by the radiology RN, with a total moderate sedation time of 42 minutes. COMPLICATIONS: None immediate. PROCEDURE: Informed written consent was obtained from the patient after a thorough discussion of the procedural risks, benefits and alternatives. All questions were addressed. Maximal Sterile Barrier Technique was utilized including caps, mask, sterile gowns, sterile gloves, sterile drape, hand hygiene and skin antiseptic. A timeout was performed prior to the initiation of the procedure. Patient placed in right anterior oblique position and a limited axial scanning through the abdomen pelvis was performed. Appropriate skin entry sites were identified and marked, then prepped with chlorhexidine, draped in usual sterile fashion, infiltrated locally with 1% lidocaine. Under CT fluoroscopic guidance, a 21 gauge trocar needle advanced into a posterior lower pole calyx of the left renal collecting system. 018 guidewire advanced centrally without resistance, confirmed on CT. The transitional dilator was utilized to allow parallel placement of a 038 stiff Amplatz wire. Position confirmed on CT. Tract was dilated to facilitate placement 10 French pigtail drain catheter, formed centrally within the left renal collecting system. 5 mL of purulent urine were aspirated, sent for Gram  stain and culture. In similar fashion, the left retroperitoneal pelvic gas and fluid collection anterior to the psoas was localized. 18 gauge trocar needle was advanced to the collection. Purulent material could be aspirated. A new sterile Amplatz wire advanced easily into the collection, position confirmed on CT. Tract dilated to facilitate placement 12 French pigtail drain catheter, formed centrally within the collection. 10 mL of purulent material were aspirated, sent for Gram stain and culture. Both catheters were secured externally with 0 Prolene sutures and StatLock devices and placed to gravity drain bags. The patient tolerated the procedure well. IMPRESSION: 1. Technically successful left nephrostomy catheter placement with CT guidance. 2. Technically successful left pelvic abscess drain catheter placement with CT guidance. 3. Aspirates from both sites were sent for Gram stain and culture. Electronically Signed   By: Corlis Leak M.D.   On: 08/02/2020 08:25   CT IMAGE GUIDED DRAINAGE BY PERCUTANEOUS CATHETER  Result Date: 08/02/2020 INDICATION: Left emphysematous pyelonephritis with obstructing calculus and retroperitoneal abscess EXAM: CT-GUIDED LEFT NEPHROSTOMY PLACEMENT CT-GUIDED PELVIC ABSCESS DRAIN CATHETER PLACEMENT MEDICATIONS: The patient is currently admitted to the hospital and receiving intravenous antibiotics. The antibiotics were administered within an appropriate time frame prior to the initiation of the procedure. ANESTHESIA/SEDATION: Intravenous Fentanyl and Versed 1mg  were administered as conscious sedation during continuous monitoring of the patient's level of consciousness and physiological / cardiorespiratory status by the radiology RN, with a total moderate sedation time of 42 minutes. COMPLICATIONS: None immediate. PROCEDURE: Informed written consent was obtained from the patient after a thorough discussion of the procedural risks, benefits and alternatives. All questions were  addressed. Maximal Sterile Barrier Technique was utilized including caps, mask, sterile gowns, sterile gloves, sterile drape, hand hygiene and skin antiseptic. A timeout was performed prior to the initiation of the procedure. Patient placed in right anterior oblique position and a limited axial scanning through the abdomen pelvis was performed. Appropriate skin entry sites were identified and marked, then prepped with chlorhexidine, draped in usual sterile fashion, infiltrated locally with 1% lidocaine. Under CT fluoroscopic guidance, a 21 gauge trocar needle advanced into a posterior lower pole calyx of the left renal collecting system. 018 guidewire advanced centrally without resistance, confirmed on CT. The transitional dilator was utilized to allow parallel placement of a 038 stiff Amplatz wire. Position confirmed on CT. Tract was dilated to facilitate  placement 10 French pigtail drain catheter, formed centrally within the left renal collecting system. 5 mL of purulent urine were aspirated, sent for Gram stain and culture. In similar fashion, the left retroperitoneal pelvic gas and fluid collection anterior to the psoas was localized. 18 gauge trocar needle was advanced to the collection. Purulent material could be aspirated. A new sterile Amplatz wire advanced easily into the collection, position confirmed on CT. Tract dilated to facilitate placement 12 French pigtail drain catheter, formed centrally within the collection. 10 mL of purulent material were aspirated, sent for Gram stain and culture. Both catheters were secured externally with 0 Prolene sutures and StatLock devices and placed to gravity drain bags. The patient tolerated the procedure well. IMPRESSION: 1. Technically successful left nephrostomy catheter placement with CT guidance. 2. Technically successful left pelvic abscess drain catheter placement with CT guidance. 3. Aspirates from both sites were sent for Gram stain and culture. Electronically  Signed   By: Corlis Leak M.D.   On: 08/02/2020 08:25    Labs:  CBC: Recent Labs    07/31/20 1758 08/01/20 0641 08/02/20 0400  WBC 44.0* 49.1* 47.6*  HGB 12.6 11.4* 10.4*  HCT 34.3* 30.9* 27.9*  PLT 225 206 237    COAGS: No results for input(s): INR, APTT in the last 8760 hours.  BMP: Recent Labs    08/01/20 0217 08/01/20 0641 08/01/20 1033 08/01/20 1136 08/02/20 0059 08/02/20 0300 08/02/20 0740 08/02/20 1149  NA 105* 104* 103*   < > 112* 112* 114* 115*  K 2.9* 3.1* 3.6  --   --  3.4*  --   --   CL 74* 75* 75*  --   --  83*  --   --   CO2 12* 14* 13*  --   --  14*  --   --   GLUCOSE 227* 205* 151*  --   --  153*  --   --   BUN 108* 114* 108*  --   --  113*  --   --   CALCIUM 7.6* 7.7* 7.8*  --   --  7.9*  --   --   CREATININE 4.13* 3.82* 3.79*  --   --  3.66*  --   --   GFRNONAA 12* 13* 13*  --   --  14*  --   --    < > = values in this interval not displayed.    LIVER FUNCTION TESTS: Recent Labs    07/31/20 1913 08/01/20 0641  BILITOT 1.3* 0.9  AST 14* 19  ALT 16 19  ALKPHOS 147* 142*  PROT 5.4* 5.4*  ALBUMIN 2.0* 2.1*    Assessment and Plan:  4 mm LEFT UPJ calculus with mild LEFT hydronephrosis and Hydroureter.  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  S/P perinephric drain and a percutaneous nephrostomy tube yesterday by Dr. Deanne Coffer.  Routine drain care. Recommend CT scan when output from perinephric drain is 10-15 mL per day.   Electronically Signed: Gwynneth Macleod, PA-C 08/02/2020, 12:23 PM    I spent a total of 15 Minutes at the the patient's bedside AND on the patient's hospital floor or unit, greater than 50% of which was counseling/coordinating care for f/u perinephric drain and PCN.

## 2020-08-02 NOTE — Progress Notes (Signed)
2000 sodium lab draw resulted as 110, notified MD, instructed to d/c 3% NS infusion, recheck sodium again at 2300, sodium drawn resulted 112, Sodium drawn at 0300 resulted 112 as well, LR started at 49ml/hr. Lab results not transferring over to epic, lab notified and lab states results transfer to their system ([facility]) but the orders do not show up for them. Elink notified of this and have kept them updated with the results from lab.

## 2020-08-02 NOTE — Progress Notes (Signed)
CRITICAL VALUE ALERT  Critical Value:  Na 115  Date & Time Notied:  08/02/20 1220  Provider Notified: Dr. Vassie Loll

## 2020-08-03 DIAGNOSIS — N12 Tubulo-interstitial nephritis, not specified as acute or chronic: Secondary | ICD-10-CM

## 2020-08-03 DIAGNOSIS — A4150 Gram-negative sepsis, unspecified: Secondary | ICD-10-CM

## 2020-08-03 LAB — CBC WITH DIFFERENTIAL/PLATELET
Abs Immature Granulocytes: 1.2 10*3/uL — ABNORMAL HIGH (ref 0.00–0.07)
Basophils Absolute: 0.2 10*3/uL — ABNORMAL HIGH (ref 0.0–0.1)
Basophils Relative: 1 %
Eosinophils Absolute: 0.1 10*3/uL (ref 0.0–0.5)
Eosinophils Relative: 0 %
HCT: 28.3 % — ABNORMAL LOW (ref 36.0–46.0)
Hemoglobin: 10.5 g/dL — ABNORMAL LOW (ref 12.0–15.0)
Immature Granulocytes: 4 %
Lymphocytes Relative: 2 %
Lymphs Abs: 0.6 10*3/uL — ABNORMAL LOW (ref 0.7–4.0)
MCH: 31.1 pg (ref 26.0–34.0)
MCHC: 37.1 g/dL — ABNORMAL HIGH (ref 30.0–36.0)
MCV: 83.7 fL (ref 80.0–100.0)
Monocytes Absolute: 1 10*3/uL (ref 0.1–1.0)
Monocytes Relative: 3 %
Neutro Abs: 27.3 10*3/uL — ABNORMAL HIGH (ref 1.7–7.7)
Neutrophils Relative %: 90 %
Platelets: 273 10*3/uL (ref 150–400)
RBC: 3.38 MIL/uL — ABNORMAL LOW (ref 3.87–5.11)
RDW: 14.3 % (ref 11.5–15.5)
WBC Morphology: INCREASED
WBC: 30.3 10*3/uL — ABNORMAL HIGH (ref 4.0–10.5)
nRBC: 0 % (ref 0.0–0.2)

## 2020-08-03 LAB — BASIC METABOLIC PANEL
Anion gap: 17 — ABNORMAL HIGH (ref 5–15)
Anion gap: 14 (ref 5–15)
BUN: 103 mg/dL — ABNORMAL HIGH (ref 6–20)
BUN: 99 mg/dL — ABNORMAL HIGH (ref 6–20)
CO2: 20 mmol/L — ABNORMAL LOW (ref 22–32)
CO2: 24 mmol/L (ref 22–32)
Calcium: 7.7 mg/dL — ABNORMAL LOW (ref 8.9–10.3)
Calcium: 7.8 mg/dL — ABNORMAL LOW (ref 8.9–10.3)
Chloride: 87 mmol/L — ABNORMAL LOW (ref 98–111)
Chloride: 88 mmol/L — ABNORMAL LOW (ref 98–111)
Creatinine, Ser: 3.26 mg/dL — ABNORMAL HIGH (ref 0.44–1.00)
Creatinine, Ser: 3.2 mg/dL — ABNORMAL HIGH (ref 0.44–1.00)
GFR, Estimated: 16 mL/min — ABNORMAL LOW (ref >60)
GFR, Estimated: 16 mL/min — ABNORMAL LOW (ref >60)
Glucose, Bld: 92 mg/dL (ref 70–99)
Glucose, Bld: 107 mg/dL — ABNORMAL HIGH (ref 70–99)
Potassium: 2.7 mmol/L — CL (ref 3.5–5.1)
Potassium: 2.8 mmol/L — ABNORMAL LOW (ref 3.5–5.1)
Sodium: 124 mmol/L — ABNORMAL LOW (ref 135–145)
Sodium: 126 mmol/L — ABNORMAL LOW (ref 135–145)

## 2020-08-03 LAB — CULTURE, BLOOD (SINGLE): Special Requests: ADEQUATE

## 2020-08-03 LAB — GLUCOSE, CAPILLARY
Glucose-Capillary: 101 mg/dL — ABNORMAL HIGH (ref 70–99)
Glucose-Capillary: 106 mg/dL — ABNORMAL HIGH (ref 70–99)
Glucose-Capillary: 106 mg/dL — ABNORMAL HIGH (ref 70–99)
Glucose-Capillary: 114 mg/dL — ABNORMAL HIGH (ref 70–99)
Glucose-Capillary: 146 mg/dL — ABNORMAL HIGH (ref 70–99)
Glucose-Capillary: 169 mg/dL — ABNORMAL HIGH (ref 70–99)
Glucose-Capillary: 210 mg/dL — ABNORMAL HIGH (ref 70–99)

## 2020-08-03 LAB — MAGNESIUM: Magnesium: 2.5 mg/dL — ABNORMAL HIGH (ref 1.7–2.4)

## 2020-08-03 LAB — PHOSPHORUS: Phosphorus: 6.7 mg/dL — ABNORMAL HIGH (ref 2.5–4.6)

## 2020-08-03 LAB — SODIUM: Sodium: 121 mmol/L — ABNORMAL LOW (ref 135–145)

## 2020-08-03 MED ORDER — INSULIN GLARGINE 100 UNIT/ML ~~LOC~~ SOLN
10.0000 [IU] | Freq: Two times a day (BID) | SUBCUTANEOUS | Status: DC
  Administered 2020-08-03 – 2020-08-06 (×8): 10 [IU] via SUBCUTANEOUS
  Filled 2020-08-03 (×10): qty 0.1

## 2020-08-03 MED ORDER — POTASSIUM CHLORIDE 10 MEQ/100ML IV SOLN
10.0000 meq | INTRAVENOUS | Status: AC
  Administered 2020-08-03 (×6): 10 meq via INTRAVENOUS
  Filled 2020-08-03 (×6): qty 100

## 2020-08-03 MED ORDER — POTASSIUM CHLORIDE 10 MEQ/100ML IV SOLN
10.0000 meq | INTRAVENOUS | Status: AC
  Administered 2020-08-03 (×3): 10 meq via INTRAVENOUS
  Filled 2020-08-03 (×3): qty 100

## 2020-08-03 NOTE — Progress Notes (Addendum)
Called bedside RN to call [facility] when the 2000 NA results. Bedside RN acknowledged.

## 2020-08-03 NOTE — Progress Notes (Signed)
Spoke with bedside RN about NA results, and RN said she drew the lab herself and sent it to the lab thirty minutes ago. She is calling lab to see why the 2000 NA has still not resulted. Pola Corn MD notified about delays.

## 2020-08-03 NOTE — Progress Notes (Signed)
Inquired about NA result and RN said she noticed that the lab did not draw the NA and she was going to call so they can have it drawn. Informed her to call Elink when it resulted.

## 2020-08-03 NOTE — Progress Notes (Signed)
Na 121, Elink MD informed.

## 2020-08-03 NOTE — Progress Notes (Signed)
Subjective: Remains afebrile. No tachycardia, normotensive. C/o pain, controlled.   Objective: Vital signs in last 24 hours: Temp:  [98 F (36.7 C)-99.1 F (37.3 C)] 99.1 F (37.3 C) (12/26 0900) Pulse Rate:  [75-83] 81 (12/26 0600) Resp:  [15-22] 19 (12/26 0600) BP: (106-153)/(34-71) 152/60 (12/26 0600) SpO2:  [87 %-94 %] 94 % (12/26 0600) Arterial Line BP: (86-156)/(39-59) 144/53 (12/26 0600)  Intake/Output from previous day: 12/25 0701 - 12/26 0700 In: 2241.7 [I.V.:2143.5; IV Piggyback:98.2] Out: 2395 [Urine:2195; Drains:200] Intake/Output this shift: No intake/output data recorded.   Foley: 2L clear yellow  L PCN: pink tinge L Perirenal drain: purulent  Physical Exam:  General: Alert and oriented CV: RRR Lungs: Clear Abdomen: Soft, ND, ATTP; L CVA tenderness; no peritoneal signs Ext: NT, No erythema  Lab Results: Recent Labs    08/01/20 0641 08/02/20 0400 08/03/20 0412  HGB 11.4* 10.4* 10.5*  HCT 30.9* 27.9* 28.3*   BMET Recent Labs    08/02/20 0300 08/02/20 0740 08/02/20 2357 08/03/20 0445  NA 112*   < > 121* 124*  K 3.4*  --   --  2.7*  CL 83*  --   --  87*  CO2 14*  --   --  20*  GLUCOSE 153*  --   --  92  BUN 113*  --   --  PENDING  CREATININE 3.66*  --   --  3.26*  CALCIUM 7.9*  --   --  7.7*   < > = values in this interval not displayed.     Studies/Results: CT ABDOMEN PELVIS WO CONTRAST  Result Date: 08/01/2020 CLINICAL DATA:  Abnormal chest radiograph, lung nodule, generalized abdominal pain and distension, confusion, no medical care in years EXAM: CT CHEST, ABDOMEN AND PELVIS WITHOUT CONTRAST TECHNIQUE: Multidetector CT imaging of the chest, abdomen and pelvis was performed following the standard protocol without IV contrast. IV contrast not administered due to renal dysfunction. Sagittal and coronal MPR images reconstructed from axial data set. No oral contrast administered. COMPARISON:  Chest radiograph 07/31/2020  FINDINGS: CT CHEST FINDINGS Cardiovascular: Aneurysmal dilatation of ascending thoracic aorta 4.1 cm transverse image 24. Minimal pericardial fluid. Heart otherwise unremarkable. Mediastinum/Nodes: Questionable thickening of esophagus versus artifact from underdistention. No thoracic adenopathy identified. Base of cervical region normal appearance. Lungs/Pleura: Multiple BILATERAL pulmonary nodules up to 12 mm diameter. A more confluent area of opacity is seen in the posterior segment of the RIGHT upper lobe, 4.0 x 3.0 cm. Some of the nodules appear ill-defined and slightly shaggy a. These could represent metastatic foci or septic emboli. No definite cavitary lesions are seen. Dependent atelectasis LEFT lower lobe. No pulmonary infiltrate, pleural effusion, or pneumothorax. Musculoskeletal: Scattered endplate spur formation thoracic spine. No sclerotic or destructive osseous lesions. CT ABDOMEN PELVIS FINDINGS Hepatobiliary: Gallbladder and liver normal appearance Pancreas: Atrophic, otherwise unremarkable Spleen: Normal appearance Adrenals/Urinary Tract: Adrenal glands normal appearance. Minimal collecting system dilatation RIGHT kidney without mass or calcification. RIGHT ureter decompressed. Abnormal appearance of LEFT kidney, which is diffusely enlarged and demonstrates significant perinephric edema. In addition, significant gas is seen in the perinephric space and questionably subcapsular, minimally in the collecting system, consistent with emphysematous pyelonephritis. Edema/fluid is seen extending along the lateral conal fascia and the anterior and posterior pararenal fascia. A gas and fluid collection is identified inferior to the LEFT kidney, 6.4 x 3.6 x 5.9 cm consistent with abscess. 4 mm calculus at LEFT ureteropelvic junction with mild LEFT hydronephrosis. Small amount of air within  bladder though this could be related to prior catheterization. Stomach/Bowel: Stomach and bowel loops normal appearance.  Edema from the LEFT renal process extends to the lateral conal fascia and adjacent to the descending colon but the descending colon does not appear to be thickened or involved. Vascular/Lymphatic: Minimal atherosclerotic calcification aorta. Aorta normal caliber. No definite adenopathy. Reproductive: Unremarkable uterus and adnexa Other: No free air or free fluid. LEFT retroperitoneal edema the posterior pararenal space extending into upper pelvis. No hernia. Musculoskeletal: No acute osseous findings. IMPRESSION: Multiple BILATERAL pulmonary nodules up to 12 mm diameter, some of which appear ill-defined and slightly shaggy. These could represent metastatic foci or septic emboli. Correlation with cardiology recommended to exclude source of septic embolism. 4 mm LEFT UPJ calculus with mild LEFT hydronephrosis and hydroureter. 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 Recommend annual imaging followup by CTA or MRA. This recommendation follows 2010 ACCF/AHA/AATS/ACR/ASA/SCA/SCAI/SIR/STS/SVM Guidelines for the Diagnosis and Management of Patients with Thoracic Aortic Disease. Circulation. 2010; 121: E454-U981: E266-e369. Aortic aneurysm NOS (ICD10-I71.9) Aortic Atherosclerosis (ICD10-I70.0) Findings discussed with Dr. Henreitta LeberBridges at time of exam on 08/01/2020. Electronically Signed   By: Ulyses SouthwardMark  Boles M.D.   On: 08/01/2020 11:34   CT HEAD WO CONTRAST  Result Date: 08/01/2020 CLINICAL DATA:  Disorientation, abnormal chest radiograph with pulmonary nodules, hyponatremia EXAM: CT HEAD WITHOUT CONTRAST TECHNIQUE: Contiguous axial images were obtained from the base of the skull through the vertex without intravenous contrast. Sagittal and coronal MPR images reconstructed from axial data set. COMPARISON:  None FINDINGS: Brain: Motion artifacts on initial  imaging, for which repeat imaging was performed. Normal ventricular morphology. No midline shift or mass effect. Normal appearance of brain parenchyma. No intracranial hemorrhage, mass lesion, or evidence of acute infarction. Vascular: No hyperdense vessels Skull: Intact Sinuses/Orbits: Clear Other: N/A IMPRESSION: No acute intracranial abnormalities. Electronically Signed   By: Ulyses SouthwardMark  Boles M.D.   On: 08/01/2020 11:36   CT CHEST WO CONTRAST  Result Date: 08/01/2020 CLINICAL DATA:  Abnormal chest radiograph, lung nodule, generalized abdominal pain and distension, confusion, no medical care in years EXAM: CT CHEST, ABDOMEN AND PELVIS WITHOUT CONTRAST TECHNIQUE: Multidetector CT imaging of the chest, abdomen and pelvis was performed following the standard protocol without IV contrast. IV contrast not administered due to renal dysfunction. Sagittal and coronal MPR images reconstructed from axial data set. No oral contrast administered. 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 Minimal pericardial fluid. Heart otherwise unremarkable. Mediastinum/Nodes: Questionable thickening of esophagus versus artifact from underdistention. No thoracic adenopathy identified. Base of cervical region normal appearance. Lungs/Pleura: Multiple BILATERAL pulmonary nodules up to 12 mm diameter. A more confluent area of opacity is seen in the posterior segment of the RIGHT upper lobe, 4.0 x 3.0 cm. Some of the nodules appear ill-defined and slightly shaggy a. These could represent metastatic foci or septic emboli. No definite cavitary lesions are seen. Dependent atelectasis LEFT lower lobe. No pulmonary infiltrate, pleural effusion, or pneumothorax. Musculoskeletal: Scattered endplate spur formation thoracic spine. No sclerotic or destructive osseous lesions. CT ABDOMEN PELVIS FINDINGS Hepatobiliary: Gallbladder and  liver normal appearance Pancreas: Atrophic, otherwise unremarkable Spleen: Normal appearance Adrenals/Urinary Tract: Adrenal glands normal appearance. Minimal collecting system dilatation RIGHT kidney without mass or calcification. RIGHT ureter decompressed. Abnormal appearance of LEFT kidney, which  is diffusely enlarged and demonstrates significant perinephric edema. In addition, significant gas is seen in the perinephric space and questionably subcapsular, minimally in the collecting system, consistent with emphysematous pyelonephritis. Edema/fluid is seen extending along the lateral conal fascia and the anterior and posterior pararenal fascia. A gas and fluid collection is identified inferior to the LEFT kidney, 6.4 x 3.6 x 5.9 cm consistent with abscess. 4 mm calculus at LEFT ureteropelvic junction with mild LEFT hydronephrosis. Small amount of air within bladder though this could be related to prior catheterization. Stomach/Bowel: Stomach and bowel loops normal appearance. Edema from the LEFT renal process extends to the lateral conal fascia and adjacent to the descending colon but the descending colon does not appear to be thickened or involved. Vascular/Lymphatic: Minimal atherosclerotic calcification aorta. Aorta normal caliber. No definite adenopathy. Reproductive: Unremarkable uterus and adnexa Other: No free air or free fluid. LEFT retroperitoneal edema the posterior pararenal space extending into upper pelvis. No hernia. Musculoskeletal: No acute osseous findings. IMPRESSION: Multiple BILATERAL pulmonary nodules up to 12 mm diameter, some of which appear ill-defined and slightly shaggy. These could represent metastatic foci or septic emboli. Correlation with cardiology recommended to exclude source of septic embolism. 4 mm LEFT UPJ calculus with mild LEFT hydronephrosis and hydroureter. Significant emphysematous pyelonephritis changes involving the LEFT kidney with a 6.4 x 3.6 x 5.9 cm diameter abscess  collection inferior to the LEFT kidney. 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 Recommend annual imaging followup by CTA or MRA. This recommendation follows 2010 ACCF/AHA/AATS/ACR/ASA/SCA/SCAI/SIR/STS/SVM Guidelines for the Diagnosis and Management of Patients with Thoracic Aortic Disease. Circulation. 2010; 121: Z610-R604. Aortic aneurysm NOS (ICD10-I71.9) Aortic Atherosclerosis (ICD10-I70.0) Findings discussed with Dr. Henreitta Leber at time of exam on 08/01/2020. Electronically Signed   By: Ulyses Southward M.D.   On: 08/01/2020 11:34   CT IMAGE GUIDED DRAINAGE BY PERCUTANEOUS CATHETER  Result Date: 08/02/2020 INDICATION: Left emphysematous pyelonephritis with obstructing calculus and retroperitoneal abscess EXAM: CT-GUIDED LEFT NEPHROSTOMY PLACEMENT CT-GUIDED PELVIC ABSCESS DRAIN CATHETER PLACEMENT MEDICATIONS: The patient is currently admitted to the hospital and receiving intravenous antibiotics. The antibiotics were administered within an appropriate time frame prior to the initiation of the procedure. ANESTHESIA/SEDATION: Intravenous Fentanyl and Versed  were administered as conscious sedation during continuous monitoring of the patient's level of consciousness and physiological / cardiorespiratory status by the radiology RN, with a total moderate sedation time of 42 minutes. COMPLICATIONS: None immediate. PROCEDURE: Informed written consent was obtained from the patient after a thorough discussion of the procedural risks, benefits and alternatives. All questions were addressed. Maximal Sterile Barrier Technique was utilized including caps, mask, sterile gowns, sterile gloves, sterile drape, hand hygiene and skin antiseptic. A timeout was performed prior to the initiation of the procedure. Patient placed in right anterior oblique position and a limited axial scanning through the abdomen pelvis was performed. Appropriate skin entry sites were identified and marked, then prepped  with chlorhexidine, draped in usual sterile fashion, infiltrated locally with 1% lidocaine. Under CT fluoroscopic guidance, a 21 gauge trocar needle advanced into a posterior lower pole calyx of the left renal collecting system. 018 guidewire advanced centrally without resistance, confirmed on CT. The transitional dilator was utilized to allow parallel placement of a 038 stiff Amplatz wire. Position confirmed on CT. Tract was dilated to facilitate placement 10 French pigtail drain catheter, formed centrally within the left renal collecting system. 5 mL of purulent urine were aspirated, sent for Gram stain and culture. In similar fashion, the left  retroperitoneal pelvic gas and fluid collection anterior to the psoas was localized. 18 gauge trocar needle was advanced to the collection. Purulent material could be aspirated. A new sterile Amplatz wire advanced easily into the collection, position confirmed on CT. Tract dilated to facilitate placement 12 French pigtail drain catheter, formed centrally within the collection. 10 mL of purulent material were aspirated, sent for Gram stain and culture. Both catheters were secured externally with 0 Prolene sutures and StatLock devices and placed to gravity drain bags. The patient tolerated the procedure well. IMPRESSION: 1. Technically successful left nephrostomy catheter placement with CT guidance. 2. Technically successful left pelvic abscess drain catheter placement with CT guidance. 3. Aspirates from both sites were sent for Gram stain and culture. Electronically Signed   By: Corlis Leak M.D.   On: 08/02/2020 08:25   CT IMAGE GUIDED DRAINAGE BY PERCUTANEOUS CATHETER  Result Date: 08/02/2020 INDICATION: Left emphysematous pyelonephritis with obstructing calculus and retroperitoneal abscess EXAM: CT-GUIDED LEFT NEPHROSTOMY PLACEMENT CT-GUIDED PELVIC ABSCESS DRAIN CATHETER PLACEMENT MEDICATIONS: The patient is currently admitted to the hospital and receiving intravenous  antibiotics. The antibiotics were administered within an appropriate time frame prior to the initiation of the procedure. ANESTHESIA/SEDATION: Intravenous Fentanyl and Versed 1mg  were administered as conscious sedation during continuous monitoring of the patient's level of consciousness and physiological / cardiorespiratory status by the radiology RN, with a total moderate sedation time of 42 minutes. COMPLICATIONS: None immediate. PROCEDURE: Informed written consent was obtained from the patient after a thorough discussion of the procedural risks, benefits and alternatives. All questions were addressed. Maximal Sterile Barrier Technique was utilized including caps, mask, sterile gowns, sterile gloves, sterile drape, hand hygiene and skin antiseptic. A timeout was performed prior to the initiation of the procedure. Patient placed in right anterior oblique position and a limited axial scanning through the abdomen pelvis was performed. Appropriate skin entry sites were identified and marked, then prepped with chlorhexidine, draped in usual sterile fashion, infiltrated locally with 1% lidocaine. Under CT fluoroscopic guidance, a 21 gauge trocar needle advanced into a posterior lower pole calyx of the left renal collecting system. 018 guidewire advanced centrally without resistance, confirmed on CT. The transitional dilator was utilized to allow parallel placement of a 038 stiff Amplatz wire. Position confirmed on CT. Tract was dilated to facilitate placement 10 French pigtail drain catheter, formed centrally within the left renal collecting system. 5 mL of purulent urine were aspirated, sent for Gram stain and culture. In similar fashion, the left retroperitoneal pelvic gas and fluid collection anterior to the psoas was localized. 18 gauge trocar needle was advanced to the collection. Purulent material could be aspirated. A new sterile Amplatz wire advanced easily into the collection, position confirmed on CT.  Tract dilated to facilitate placement 12 French pigtail drain catheter, formed centrally within the collection. 10 mL of purulent material were aspirated, sent for Gram stain and culture. Both catheters were secured externally with 0 Prolene sutures and StatLock devices and placed to gravity drain bags. The patient tolerated the procedure well. IMPRESSION: 1. Technically successful left nephrostomy catheter placement with CT guidance. 2. Technically successful left pelvic abscess drain catheter placement with CT guidance. 3. Aspirates from both sites were sent for Gram stain and culture. Electronically Signed   By: M.D.   On: 08/02/2020 08:25    Assessment/Plan: 1. Left emphysematous pyelonephritis: CT A/P 08/01/2020 with evidence of left emphysematous pyelonephritis associated with 6.4 x 5.9 cm perirenal abscess inferior to the left  kidney. S/p left PCN placement 08/01/2020.  S/p left perirenal abscess drain 08/01/2020. 2. Left UPJ stone: CT A/P 12/24 with 4 mm left UPJ calculus with mild left hydronephrosis.  S/p left PCN placement 08/01/2020. 3. Multiple bilateral pulmonary nodules: Seen on CT A/P up to 12 mm in diameter, could represent metastatic foci or septic emboli. 4. Ascending thoracic aorta aneurysmal dilatation: Up to 4.1 cm on CT A/P 08/01/2020 5. AKI: From unknown baseline 6. Septic shock due to left emphysematous pyelonephritis with bacteriuria 7. Hyperglycemia: Initially in 700s on presentation, resolved. 8. Severe hyponatremia: improving  -Remains afebrile, leukocytosis downtrending to 30.3 -UCx <10K (insignificant growth) - obtained after starting abx.  -Blood Cx with enterobacterales and E coli. -Cr downtrending to 3.26 from 3.66 -Clinically improving with decompression, however still remains weak -L PCN to gravity, L perirenal drain to gravity, foley to gravity -Will need contrasted scan after renal function improves to evaluate for mass. Unclear etiology of  bilateral pulmonary nodules--metastatic foci or septic emboli. -Critical care to correct electrolyte abnormalities. Hyponatremia improving. Severe hypokalemic this AM.  -Discussed again with patient's sister and husband at bedside regarding her critical status.  I discussed risk of mortality even with the most aggressive treatment.  I discussed that if she does not improve with drains and antibiotics alone, we would consider left nephrectomy.  However, at this point, I believe that operating on her would put her at a greater risk of mortality. -Prognosis is guarded   LOS: 3 days   Matt R. Bernetta Sutley MD 08/03/2020, 7:32 AM [facility]  Pager: [phone]

## 2020-08-03 NOTE — Progress Notes (Addendum)
eLink Physician-Brief Progress Note Patient Name: Jermiyah Ricotta DOB: 12-23-1960 MRN: [id number]   Date of Service  08/03/2020  HPI/Events of Note  Hyponatremia, last sodium at 13:19.  Discussed with bed side RN.  Has order to follow sodium q4 hrly.  Nephrology: Hyponatremia: Sodium level improving at acceptable rate status post hypertonic saline earlier and now with ongoing isotonic sodium bicarbonate infusion.  We will continue to follow labs to decide on additional adjustment of therapy   eICU Interventions  -reminded RN to get sodium level and call back when resulted. On bicarb gtt at 75 ml/hr.     Intervention Category Intermediate Interventions: Electrolyte abnormality - evaluation and management  Ranee Gosselin 08/03/2020, 11:55 PM    2:56 BMP reviewed. Sodium at 128 from 126 on 26 th 13:20 . Continue care. AKI improving. On bicarb gtt. Replace low potassium . 00:21  Lab is not seeing sodium q4 hr order. Renewed/re ordered and ok to draw from art line.

## 2020-08-03 NOTE — Progress Notes (Signed)
Lab was called two times to verify that Pt was a Lab stick and need a sodium drawn. On the second call, lab tech stated that she could not see the order. This nurse asked e-link to reorder the sodium draws. E-link put in for a stat sodium draw. This nurse called phelobtomy for a third time to see if they were coming for the STAT sodium draw. Nurse had to pull lab from positional A-line. Waiting for lab to process the sodium level.

## 2020-08-03 NOTE — Progress Notes (Signed)
Spoke to bedside RN about what the lab said about drawing the Na, and lab told her that there was no order and that information was passed on to the [facility] MD, who noted that there was an order in but he would enter a stat order for the NA now, so it can be drawn.

## 2020-08-03 NOTE — Progress Notes (Signed)
Patient ID: Tammie [name], female   DOB: 1961-02-09, 59 y.o.   MRN: [id number] [name] Progress Note   Assessment/ Plan:   1. Acute kidney Injury (with unknown baseline renal function): This appears to be from a combination of obstructive and ATN mechanism (with emphysematous pyelonephritis/sepsis).  She is nonoliguric with slow/gradual downtrend of creatinine status post left side percutaneous nephrostomy/perinephric drain yesterday along with antibiotic therapy for pyelonephritis. 2.  Hyponatremia: Sodium level improving at acceptable rate status post hypertonic saline earlier and now with ongoing isotonic sodium bicarbonate infusion.  We will continue to follow labs to decide on additional adjustment of therapy. 3.  Sepsis with left emphysematous pyelonephritis/UPJ stone: Status post left sided percutaneous nephrostomy with perirenal abscess drainage on 12/24.  On antibiotic coverage with ceftriaxone with improving leukocytosis.  She remains afebrile. 4.  Hypokalemia: Likely secondary to total body deficit/poor intake and now with transcellular shifts in the setting of sodium bicarbonate treatment.  Will replace via intravenous route as mental status may not be reliable/adequate enough for oral therapy. 5.  Anion gap metabolic acidosis: Secondary to acute kidney injury/lactic acidosis of sepsis-ongoing treatment of underlying sepsis with antibiotic therapy/percutaneous drainage and on isotonic sodium bicarbonate.  Subjective:   Without acute events overnight, husband at bedside informs me that she did not have any specific complaints earlier.   Objective:   BP (!) 133/56   Pulse 82   Temp 98.3 F (36.8 C) (Axillary)   Resp 14   Ht 5\' 2"  (1.575 m)   Wt 81.1 kg   LMP  (LMP Unknown)   SpO2 94%   BMI 32.70 kg/m   Intake/Output Summary (Last 24 hours) at 08/03/2020 1254 Last data filed at 08/03/2020 0601 Gross per 24 hour  Intake 1635.15 ml  Output 1825 ml  Net -189.85 ml    Weight change:   Physical Exam: Gen: Somnolent and difficult to arouse, husband at bedside. CVS: Pulse regular rhythm, normal rate, S1 and S2 normal Resp: Clear to auscultation bilaterally, no distinct rales or rhonchi Abd: Soft, obese, nontender, bowel sounds normal.  Percutaneous drains noted from the left side. Ext: Trace lower extremity edema  Imaging: CT IMAGE GUIDED DRAINAGE BY PERCUTANEOUS CATHETER  Result Date: 08/02/2020 INDICATION: Left emphysematous pyelonephritis with obstructing calculus and retroperitoneal abscess EXAM: CT-GUIDED LEFT NEPHROSTOMY PLACEMENT CT-GUIDED PELVIC ABSCESS DRAIN CATHETER PLACEMENT MEDICATIONS: The patient is currently admitted to the hospital and receiving intravenous antibiotics. The antibiotics were administered within an appropriate time frame prior to the initiation of the procedure. ANESTHESIA/SEDATION: Intravenous Fentanyl 08/04/2020 and Versed 1mg  were administered as conscious sedation during continuous monitoring of the patient's level of consciousness and physiological / cardiorespiratory status by the radiology RN, with a total moderate sedation time of 42 minutes. COMPLICATIONS: None immediate. PROCEDURE: Informed written consent was obtained from the patient after a thorough discussion of the procedural risks, benefits and alternatives. All questions were addressed. Maximal Sterile Barrier Technique was utilized including caps, mask, sterile gowns, sterile gloves, sterile drape, hand hygiene and skin antiseptic. A timeout was performed prior to the initiation of the procedure. Patient placed in right anterior oblique position and a limited axial scanning through the abdomen pelvis was performed. Appropriate skin entry sites were identified and marked, then prepped with chlorhexidine, draped in usual sterile fashion, infiltrated locally with 1% lidocaine. Under CT fluoroscopic guidance, a 21 gauge trocar needle advanced into a posterior lower pole calyx  of the left renal collecting system. 018 guidewire advanced centrally without resistance,  confirmed on CT. The transitional dilator was utilized to allow parallel placement of a 038 stiff Amplatz wire. Position confirmed on CT. Tract was dilated to facilitate placement 10 French pigtail drain catheter, formed centrally within the left renal collecting system. 5 mL of purulent urine were aspirated, sent for Gram stain and culture. In similar fashion, the left retroperitoneal pelvic gas and fluid collection anterior to the psoas was localized. 18 gauge trocar needle was advanced to the collection. Purulent material could be aspirated. A new sterile Amplatz wire advanced easily into the collection, position confirmed on CT. Tract dilated to facilitate placement 12 French pigtail drain catheter, formed centrally within the collection. 10 mL of purulent material were aspirated, sent for Gram stain and culture. Both catheters were secured externally with 0 Prolene sutures and StatLock devices and placed to gravity drain bags. The patient tolerated the procedure well. IMPRESSION: 1. Technically successful left nephrostomy catheter placement with CT guidance. 2. Technically successful left pelvic abscess drain catheter placement with CT guidance. 3. Aspirates from both sites were sent for Gram stain and culture. Electronically Signed   By: Corlis Leak M.D.   On: 08/02/2020 08:25   CT IMAGE GUIDED DRAINAGE BY PERCUTANEOUS CATHETER  Result Date: 08/02/2020 INDICATION: Left emphysematous pyelonephritis with obstructing calculus and retroperitoneal abscess EXAM: CT-GUIDED LEFT NEPHROSTOMY PLACEMENT CT-GUIDED PELVIC ABSCESS DRAIN CATHETER PLACEMENT MEDICATIONS: The patient is currently admitted to the hospital and receiving intravenous antibiotics. The antibiotics were administered within an appropriate time frame prior to the initiation of the procedure. ANESTHESIA/SEDATION: Intravenous Fentanyl and Versed 1mg  were  administered as conscious sedation during continuous monitoring of the patient's level of consciousness and physiological / cardiorespiratory status by the radiology RN, with a total moderate sedation time of 42 minutes. COMPLICATIONS: None immediate. PROCEDURE: Informed written consent was obtained from the patient after a thorough discussion of the procedural risks, benefits and alternatives. All questions were addressed. Maximal Sterile Barrier Technique was utilized including caps, mask, sterile gowns, sterile gloves, sterile drape, hand hygiene and skin antiseptic. A timeout was performed prior to the initiation of the procedure. Patient placed in right anterior oblique position and a limited axial scanning through the abdomen pelvis was performed. Appropriate skin entry sites were identified and marked, then prepped with chlorhexidine, draped in usual sterile fashion, infiltrated locally with 1% lidocaine. Under CT fluoroscopic guidance, a 21 gauge trocar needle advanced into a posterior lower pole calyx of the left renal collecting system. 018 guidewire advanced centrally without resistance, confirmed on CT. The transitional dilator was utilized to allow parallel placement of a 038 stiff Amplatz wire. Position confirmed on CT. Tract was dilated to facilitate placement 10 French pigtail drain catheter, formed centrally within the left renal collecting system. 5 mL of purulent urine were aspirated, sent for Gram stain and culture. In similar fashion, the left retroperitoneal pelvic gas and fluid collection anterior to the psoas was localized. 18 gauge trocar needle was advanced to the collection. Purulent material could be aspirated. A new sterile Amplatz wire advanced easily into the collection, position confirmed on CT. Tract dilated to facilitate placement 12 French pigtail drain catheter, formed centrally within the collection. 10 mL of purulent material were aspirated, sent for Gram stain and culture. Both  catheters were secured externally with 0 Prolene sutures and StatLock devices and placed to gravity drain bags. The patient tolerated the procedure well. IMPRESSION: 1. Technically successful left nephrostomy catheter placement with CT guidance. 2. Technically successful left pelvic abscess drain catheter  placement with CT guidance. 3. Aspirates from both sites were sent for Gram stain and culture. Electronically Signed   By: Corlis Leak M.D.   On: 08/02/2020 08:25    Labs: BMET Recent Labs  Lab 07/31/20 1913 07/31/20 2155 08/01/20 0217 08/01/20 0641 08/01/20 1033 08/01/20 1136 08/02/20 0059 08/02/20 0300 08/02/20 0740 08/02/20 1149 08/02/20 1600 08/02/20 2357 08/03/20 0445  NA <102* <102* 105* 104* 103*   < > 112* 112* 114* 115* 118* 121* 124*  K 3.3* 3.1* 2.9* 3.1* 3.6  --   --  3.4*  --   --   --   --  2.7*  CL <65* 69* 74* 75* 75*  --   --  83*  --   --   --   --  87*  CO2 14* 12* 12* 14* 13*  --   --  14*  --   --   --   --  20*  GLUCOSE 706* 624* 227* 205* 151*  --   --  153*  --   --   --   --  92  BUN 112* 106* 108* 114* 108*  --   --  113*  --   --   --   --  103*  CREATININE 4.19* 3.98* 4.13* 3.82* 3.79*  --   --  3.66*  --   --   --   --  3.26*  CALCIUM 7.2* 7.2* 7.6* 7.7* 7.8*  --   --  7.9*  --   --   --   --  7.7*  PHOS  --   --  6.2*  --   --   --   --   --   --   --   --   --  6.7*   < > = values in this interval not displayed.   CBC Recent Labs  Lab 07/31/20 1758 08/01/20 0641 08/02/20 0400 08/03/20 0412  WBC 44.0* 49.1* 47.6* 30.3*  NEUTROABS 40.1* 46.2* 43.5* 27.3*  HGB 12.6 11.4* 10.4* 10.5*  HCT 34.3* 30.9* 27.9* 28.3*  MCV 83.1 82.2 83.3 83.7  PLT 225 206 237 273   Medications:    . chlorhexidine  15 mL Mouth Rinse BID  . Chlorhexidine Gluconate Cloth  6 each Topical Daily  . enoxaparin (LOVENOX) injection  30 mg Subcutaneous Q24H  . insulin aspart  0-15 Units Subcutaneous Q4H  . insulin glargine  10 Units Subcutaneous BID  . mouth rinse  15 mL  Mouth Rinse q12n4p  . sodium chloride flush  5 mL Intracatheter Q8H   Zetta Bills, MD 08/03/2020, 12:54 PM

## 2020-08-03 NOTE — Progress Notes (Addendum)
NAME:  Judeen Geralds, MRN:  [id number], DOB:  04/30/1961, LOS: 3 ADMISSION DATE:  07/31/2020, CONSULTATION DATE:  12/24REFERRING MD:  Laural Benes, CHIEF COMPLAINT:  Sepsis/hyponatremia/pyelonephritis w/ abscess   Brief History:  59 year old female admitted 12/24 with chief complaint of abdominal pain.  Found to have acute left pyelonephrosis with abscess, severe sepsis, acute kidney injury, hyper osmolar nonketotic hyperglycemia, severe hyponatremia  -CT imaging showed multiple pulmonary nodules, imaging of abdomen pelvis With left hydronephrosis and evidence of perinephritic abscess Was  transferred to [facility] for placement of left nephrostomy tube and left perirenal drain Critical care asked to admit given severe hyponatremia and concern for clinical decompensation  History of Present Illness:  59 year old female patient who presented to [facility] emergency room initially on 12/24 in the early a.m. hours with chief complaint of abdominal discomfort.  This had been ongoing over a 3-day history, and was associated with decreased oral intake, nausea, and feeling lightheaded.  She noted that the nausea had been going on for about 2 weeks, but had had multiple episodes of emesis and diarrhea for the 3 days prior to presentation.  She denied melena, or hematochezia.  She had poor poor p.o. intake.  Had been reporting significant thirst.  In the ER she was found to be normotensive, afebrile.  Vital signs were stable.  She did have significant leukocytosis with white blood cell count of 44, slightly elevated lactic acid, and chest x-ray showing multifocal airspace disease concerning for possible infection' her initial blood glucose was 706.  Her corrected sodium for this was roughly 116.  She was admitted to the internal medicine service with working diagnosis of multifocal pneumonia, hyperosmolar hyperglycemic state, acute kidney injury, and hyponatremia.  She was placed on broad-spectrum antibiotics.  She was  found to have a significantly distended abdomen because of this CT abdomen and pelvis was ordered.  General surgery were consulted.  Because of diffuse airspace disease a CT chest was obtained, primary and consulting team a little concerned about possible malignancy. The CT abdomen pelvis was resulted this showed multiple and bilateral pulmonary nodules which were ill-defined.  There was no acute pulmonary emboli.  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  In regards to her CT chest as noted she had bilateral multiple pulmonary nodules all of which were ill-defined.  CT of head was without intracranial hemorrhage, mass lesion or evidence of infarct.  Nephrology was consulted because of severe hyponatremia and acute kidney injury.  Because her hyponatremia was felt to be symptomatic and severe patient was started on 3% saline at 50 mL an hour, with initial goal sodium of 108.  Because of her severe hyponatremia, and concern for clinical deterioration she was asked to transfer to [facility] intensive care under the critical care service Past Medical History:   Hypertension, class I obesity  Significant Hospital Events:  12/24 admitted with chief complaint of abdominal pain.  Diagnostic imaging from CT abdomen pelvis showing left hydronephrosis, pyelonephritis, and perinephritic abscess.  Blood cultures preliminarily showing gram-negative rods, CT chest with multiple pulmonary nodules.Presenting blood glucose 706, presenting sodium 1 less than 102.  Beta hydroxybutyric acid was negative.  Was given IV hydration.  Started on  empiric antibiotics.  Transferred to [facility] following  urology consultation recommending left nephrostomy tube and perinephritic drain.  Critical care asked to admit 12/25 Sodium improved from 10 2-1 1 4   , 3% saline stopped at 3 AM  Consults:  Urology Nephrology General surgery at any [facility]  Procedures:  12/24 IR >> CT guided perc nephrostomy ,CT guided L pelvic abscess drain placement   Significant Diagnostic Tests:  CT chest abdomen and pelvis 12/24: Multiple bilateral pulmonary nodules up to 12 mm in diameter, they are ill-defined and shaggy in appearance 4 mm left UPJ calculus with mild left hydronephrosis and hydroureter the left kidney is emphysematous with a 6.4 x 3.6 x 5.9 diameter abscess CT head: Negative  Micro Data:  Urine culture 12/23 Blood culture x2 was 12/23: E coli  Abscess cx 12/24 >> GNR >> Respiratory panel by PCR 12/23: Negative for Covid and influenza.  Antimicrobials:  Azithromycin and ceftriaxone 12/23 for 1 dose Zosyn 12/24>> 12/25 12/25 ceftx >>  Interim History / Subjective:   Remains critically ill, hemodynamically stable Unable to take orally. Mental status gradually improving Urine output adequate Complains of pain left flank   Objective   Blood pressure (!) 152/60, pulse 81, temperature 99.1 F (37.3 C), temperature source Axillary, resp. rate 19, height 5\' 2"  (1.575 m), weight 81.1 kg, SpO2 94 %.        Intake/Output Summary (Last 24 hours) at 08/03/2020 0908 Last data filed at 08/03/2020 0601 Gross per 24 hour  Intake 1984.69 ml  Output 2395 ml  Net -410.31 ml   Filed Weights   07/31/20 1702 08/01/20 0000  Weight: 79.4 kg 81.1 kg    Examination: General: Middle-aged obese woman in no distress, able to lie supine HENT: Mild pallor, no icterus, no JVD, parched, dry mucosa, crusting around lips Lungs: Bilateral decreased breath sounds, no accessory muscle use Cardiovascular: S1-S2 tacky Abdomen: Soft distended, nontender, no guarding Extremities: No mottling, good peripheral  pulses Neuro: Oriented to name, able to interact, nonfocal week and deconditioned   Chest x-ray independently reviewed which shows multifocal opacities. Labs show improving sodium to 124, severe hypokalemia 2.7, decreasing creatinine 3.3, decreasing leukocytosis, stable anemia  Resolved Hospital Problem list     Assessment & Plan:   Severe sepsis with E. coli bacteremia in the setting of left emphysematous pyelonephritis/ with perinephritic abscess -Appears improving status post PERC drains Plan Continue ceftriaxone Hold home antihypertensives High risk of mortality with surgery, urology following   Hyperosmolar nonketotic hyperglycemia Initial blood glucose in the 700s, has nonanion gap metabolic acidosis and mild elevation of beta hydroxybutyric acid Plan Continue IV hydration Decrease Lantus 10 units every 12 expect insulin requirements to decrease as the infection comes under control   Acute kidney injury.  Underlying renal function unknown.  Has not had medical care for about 30 years Likely at least somewhat exacerbated by left pyelonephritis with abscess Plan Continuing volume resuscitation   severe hyponatremia Improved appropriately with 3% saline , changed to isotonic bicarb, sodium has increased by 12 last 24 hours which is adequate Plan Continue bicarbonate drip, would ideally like hypotonic bicarb at this point but sodium is rising adequately  Hypokalemia will be repleted aggressively   Multiple pulmonary nodules. Differential includes metastatic malignancy especially if primary renal malignancy in play Doubt septic emboli from E. coli Plan May need biopsy eventually, once acute issues resolve Repeat chest x-ray, may need repeat CT at some point     Best practice (evaluated daily)  Diet: N.p.o. Pain/Anxiety/Delirium protocol (if indicated): Not indicated VAP protocol (if indicated): Not indicated DVT prophylaxis: Subcutaneous heparin GI  prophylaxis: PPI Glucose control: SSI Mobility: Bedrest Disposition: ICU  Goals of Care:   Code Status: full  Labs   CBC: Recent Labs  Lab 07/31/20 1758 08/01/20 0641 08/02/20 0400 08/03/20 0412  WBC 44.0* 49.1* 47.6* 30.3*  NEUTROABS 40.1* 46.2* 43.5* 27.3*  HGB 12.6 11.4* 10.4* 10.5*  HCT 34.3* 30.9* 27.9* 28.3*  MCV 83.1 82.2 83.3 83.7  PLT 225 206 237 273    Basic Metabolic Panel: Recent Labs  Lab 08/01/20 0217 08/01/20 0641 08/01/20 1033 08/01/20 1136 08/02/20 0300 08/02/20 0400 08/02/20 0740 08/02/20 1149 08/02/20 1600 08/02/20 2357 08/03/20 0445  NA 105* 104* 103*   < > 112*  --  114* 115* 118* 121* 124*  K 2.9* 3.1* 3.6  --  3.4*  --   --   --   --   --  2.7*  CL 74* 75* 75*  --  83*  --   --   --   --   --  87*  CO2 12* 14* 13*  --  14*  --   --   --   --   --  20*  GLUCOSE 227* 205* 151*  --  153*  --   --   --   --   --  92  BUN 108* 114* 108*  --  113*  --   --   --   --   --  103*  CREATININE 4.13* 3.82* 3.79*  --  3.66*  --   --   --   --   --  3.26*  CALCIUM 7.6* 7.7* 7.8*  --  7.9*  --   --   --   --   --  7.7*  MG 1.6*  --   --   --   --  2.6*  --   --   --   --  2.5*  PHOS 6.2*  --   --   --   --   --   --   --   --   --  6.7*   < > = values in this interval not displayed.   GFR: Estimated Creatinine Clearance: 18.3 mL/min (A) (by C-G formula based on SCr of 3.26 mg/dL (H)). Recent Labs  Lab 07/31/20 1758 07/31/20 1913 08/01/20 0217 08/01/20 0641 08/02/20 0400 08/03/20 0412  PROCALCITON  --   --  >150.00  --   --   --   WBC 44.0*  --   --  49.1* 47.6* 30.3*  LATICACIDVEN 2.0* 1.9  --   --   --   --     Liver Function Tests: Recent Labs  Lab 07/31/20 1913 08/01/20 0641  AST 14* 19  ALT 16 19  ALKPHOS 147* 142*  BILITOT 1.3* 0.9  PROT 5.4* 5.4*  ALBUMIN 2.0* 2.1*   Recent Labs  Lab 07/31/20 1913  LIPASE 51   Recent Labs  Lab 07/31/20 1758  AMMONIA 32    ABG    Component Value Date/Time   HCO3 16.7 (L)  08/01/2020 0217   ACIDBASEDEF 10.9 (H) 08/01/2020 0217   O2SAT 95.0 08/01/2020 0217     Coagulation Profile: No results for input(s): INR, PROTIME in the last 168 hours.  Cardiac Enzymes: No results for input(s): CKTOTAL, CKMB, CKMBINDEX, TROPONINI in the last 168 hours.  HbA1C: Hgb A1c MFr Bld  Date/Time  Value Ref Range Status  08/01/2020 02:17 AM 12.2 (H) 4.8 - 5.6 % Final    Comment:    (NOTE) Pre diabetes:          5.7%-6.4%  Diabetes:              >6.4%  Glycemic control for   <7.0% adults with diabetes     CBG: Recent Labs  Lab 08/02/20 1535 08/02/20 1752 08/02/20 1935 08/03/20 0001 08/03/20 0748  GLUCAP 164* 188* 158* 114* 106*    Sister Misty StanleyLisa updated at bedside who has arrived from [place] Patient critically ill due to E. coli sepsis, hyponatremia, AKI Interventions to address this today sodium correction with monitoring, fluids and antibiotics Risk of deterioration without these interventions is high  I personally spent 31 minutes providing critical care not including any separately billable procedures   Cyril Mourningakesh Sinia Antosh MD. FCCP. [facility] See Amion for pager  If no response to pager , please call [phone]  After 7:00 pm call Elink  [phone]   08/03/2020

## 2020-08-03 NOTE — Progress Notes (Signed)
CRITICAL VALUE ALERT  Critical Value:  k 2.7 Date & Time Notied: 08/03/20 0550 Provider Notified:e link Orders Received/Actions taken: awaiting orders

## 2020-08-04 ENCOUNTER — Inpatient Hospital Stay (HOSPITAL_COMMUNITY): Payer: Self-pay

## 2020-08-04 DIAGNOSIS — A419 Sepsis, unspecified organism: Secondary | ICD-10-CM | POA: Diagnosis not present

## 2020-08-04 DIAGNOSIS — R410 Disorientation, unspecified: Secondary | ICD-10-CM

## 2020-08-04 DIAGNOSIS — J189 Pneumonia, unspecified organism: Secondary | ICD-10-CM

## 2020-08-04 DIAGNOSIS — J9690 Respiratory failure, unspecified, unspecified whether with hypoxia or hypercapnia: Secondary | ICD-10-CM | POA: Diagnosis not present

## 2020-08-04 DIAGNOSIS — D72829 Elevated white blood cell count, unspecified: Secondary | ICD-10-CM

## 2020-08-04 LAB — CBC
HCT: 29.2 % — ABNORMAL LOW (ref 36.0–46.0)
Hemoglobin: 10.5 g/dL — ABNORMAL LOW (ref 12.0–15.0)
MCH: 31.3 pg (ref 26.0–34.0)
MCHC: 36 g/dL (ref 30.0–36.0)
MCV: 86.9 fL (ref 80.0–100.0)
Platelets: 308 10*3/uL (ref 150–400)
RBC: 3.36 MIL/uL — ABNORMAL LOW (ref 3.87–5.11)
RDW: 14.8 % (ref 11.5–15.5)
WBC: 16.3 10*3/uL — ABNORMAL HIGH (ref 4.0–10.5)
nRBC: 0.1 % (ref 0.0–0.2)

## 2020-08-04 LAB — GLUCOSE, CAPILLARY
Glucose-Capillary: 91 mg/dL (ref 70–99)
Glucose-Capillary: 101 mg/dL — ABNORMAL HIGH (ref 70–99)
Glucose-Capillary: 186 mg/dL — ABNORMAL HIGH (ref 70–99)
Glucose-Capillary: 207 mg/dL — ABNORMAL HIGH (ref 70–99)
Glucose-Capillary: 176 mg/dL — ABNORMAL HIGH (ref 70–99)
Glucose-Capillary: 104 mg/dL — ABNORMAL HIGH (ref 70–99)

## 2020-08-04 LAB — BASIC METABOLIC PANEL
Anion gap: 12 (ref 5–15)
Anion gap: 13 (ref 5–15)
BUN: 93 mg/dL — ABNORMAL HIGH (ref 6–20)
BUN: 87 mg/dL — ABNORMAL HIGH (ref 6–20)
CO2: 28 mmol/L (ref 22–32)
CO2: 29 mmol/L (ref 22–32)
Calcium: 8 mg/dL — ABNORMAL LOW (ref 8.9–10.3)
Calcium: 8.1 mg/dL — ABNORMAL LOW (ref 8.9–10.3)
Chloride: 88 mmol/L — ABNORMAL LOW (ref 98–111)
Chloride: 87 mmol/L — ABNORMAL LOW (ref 98–111)
Creatinine, Ser: 2.9 mg/dL — ABNORMAL HIGH (ref 0.44–1.00)
Creatinine, Ser: 2.6 mg/dL — ABNORMAL HIGH (ref 0.44–1.00)
GFR, Estimated: 18 mL/min — ABNORMAL LOW (ref >60)
GFR, Estimated: 21 mL/min — ABNORMAL LOW (ref >60)
Glucose, Bld: 114 mg/dL — ABNORMAL HIGH (ref 70–99)
Glucose, Bld: 104 mg/dL — ABNORMAL HIGH (ref 70–99)
Potassium: 3.3 mmol/L — ABNORMAL LOW (ref 3.5–5.1)
Potassium: 3.2 mmol/L — ABNORMAL LOW (ref 3.5–5.1)
Sodium: 128 mmol/L — ABNORMAL LOW (ref 135–145)
Sodium: 129 mmol/L — ABNORMAL LOW (ref 135–145)

## 2020-08-04 LAB — MAGNESIUM: Magnesium: 2.2 mg/dL (ref 1.7–2.4)

## 2020-08-04 MED ORDER — CEFAZOLIN SODIUM-DEXTROSE 2-4 GM/100ML-% IV SOLN
2.0000 g | Freq: Two times a day (BID) | INTRAVENOUS | Status: DC
  Administered 2020-08-04: 21:00:00 2 g via INTRAVENOUS
  Filled 2020-08-04 (×2): qty 100

## 2020-08-04 MED ORDER — POTASSIUM CHLORIDE 10 MEQ/100ML IV SOLN
10.0000 meq | INTRAVENOUS | Status: AC
  Administered 2020-08-04 (×3): 10 meq via INTRAVENOUS
  Filled 2020-08-04 (×3): qty 100

## 2020-08-04 MED ORDER — POTASSIUM CHLORIDE 10 MEQ/100ML IV SOLN
10.0000 meq | INTRAVENOUS | Status: AC
  Administered 2020-08-04 (×2): 10 meq via INTRAVENOUS
  Filled 2020-08-04 (×2): qty 100

## 2020-08-04 NOTE — TOC Initial Note (Signed)
Transition of Care [facility]) - Initial/Assessment Note    Patient Details  Name: Tammie [name] MRN: [id number] Date of Birth: 1960-09-24  Transition of Care [facility]) CM/SW Contact:    Golda Acre, RN Phone Number: 08/04/2020, 7:21 AM  Clinical Narrative:                 12/24 admitted with chief complaint of abdominal pain.  Diagnostic imaging from CT abdomen pelvis showing left hydronephrosis, pyelonephritis, and perinephritic abscess.  Blood cultures preliminarily showing gram-negative rods, CT chest with multiple pulmonary nodules.Presenting blood glucose 706, presenting sodium 1 less than 102.  Beta hydroxybutyric acid was negative.  Was given IV hydration.  Started on empiric antibiotics.  Transferred to [facility] following urology consultation recommending left nephrostomy tube and perinephritic drain.  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  Expected Discharge Plan: Home/Self Care Barriers to Discharge: No Barriers Identified   Patient Goals and CMS Choice   CMS Medicare.gov Compare Post Acute Care list provided to:: Patient    Expected Discharge Plan and Services Expected Discharge Plan: Home/Self Care   Discharge Planning Services: CM Consult   Living arrangements for the past 2 months: Single Family Home                                      Prior  Living Arrangements/Services Living arrangements for the past 2 months: Single Family Home Lives with:: Spouse Patient language and need for interpreter reviewed:: Yes Do you feel safe going back to the place where you live?: Yes      Need for Family Participation in Patient Care: Yes (Comment)     Criminal Activity/Legal Involvement Pertinent to Current Situation/Hospitalization: No - Comment as needed  Activities of Daily Living Home Assistive Devices/Equipment: None ADL Screening (condition at time of admission) Patient's cognitive ability adequate to safely complete daily activities?: Yes Is the patient deaf or have difficulty hearing?: No Does the patient have difficulty seeing, even when wearing glasses/contacts?: No Does the patient have difficulty concentrating, remembering, or making decisions?: No Patient able to express need for assistance with ADLs?: No Does the patient have difficulty dressing or bathing?: No Independently performs ADLs?: Yes (appropriate for developmental age) Does the patient have difficulty walking or climbing stairs?: No Weakness of Legs: None Weakness of Arms/Hands: None  Permission Sought/Granted                  Emotional Assessment Appearance:: Appears stated age Attitude/Demeanor/Rapport: Engaged Affect (typically observed): Calm Orientation: : Oriented to Self,Oriented to Place,Oriented to  Time,Oriented to Situation Alcohol / Substance Use: Not Applicable Psych Involvement: No (comment)  Admission diagnosis:  Delirium [R41.0] Hyponatremia [E87.1] Acute renal failure, unspecified acute renal failure type (HCC) [N17.9] Multifocal pneumonia [J18.9] Hyperosmolar hyperglycemic state (HHS) (  HCC) [E11.00, E11.65] Patient Active Problem List   Diagnosis Date Noted  . Hyperosmolar hyperglycemic state (HHS) (HCC) 08/01/2020  . Hyponatremia 08/01/2020  . Hypertension   . Acute renal failure (HCC)   . Hypokalemia   . Severe  protein-calorie malnutrition (HCC)   . Class 1 obesity   . Leukocytosis   . Hypomagnesemia   . Hyperphosphatemia   . Abdominal distention   . Lung nodules   . Multifocal pneumonia 07/31/2020   PCP:  Patient, No Pcp Per Pharmacy:   [facility] [address] [place] - [address] [place] [address] Phone: [phone] Fax: [phone]     Social Determinants of Health (SDOH) Interventions    Readmission Risk Interventions No flowsheet data found.

## 2020-08-04 NOTE — Progress Notes (Signed)
NAME:  Tammie [name], MRN:  [id number], DOB:  01-Mar-1961, LOS: 4 ADMISSION DATE:  07/31/2020, CONSULTATION DATE:  12/24REFERRING MD:  Laural Benes, CHIEF COMPLAINT:  Sepsis/hyponatremia/pyelonephritis w/ abscess   Brief History:  59 year old female admitted 12/24 with chief complaint of abdominal pain.  Found to have acute left pyelonephrosis with abscess, severe sepsis, acute kidney injury, hyper osmolar nonketotic hyperglycemia, severe hyponatremia  -CT imaging showed multiple pulmonary nodules, imaging of abdomen pelvis With left hydronephrosis and evidence of perinephritic abscess Was  transferred to [facility] for placement of left nephrostomy tube and left perirenal drain Critical care asked to admit given severe hyponatremia and concern for clinical decompensation  History of Present Illness:  59 year old female patient who presented to [facility] emergency room initially on 12/24 in the early a.m. hours with chief complaint of abdominal discomfort.  This had been ongoing over a 3-day history, and was associated with decreased oral intake, nausea, and feeling lightheaded.  She noted that the nausea had been going on for about 2 weeks, but had had multiple episodes of emesis and diarrhea for the 3 days prior to presentation.  She denied melena, or hematochezia.  She had poor poor p.o. intake.  Had been reporting significant thirst.  In the ER she was found to be normotensive, afebrile.  Vital signs were stable.  She did have significant leukocytosis with white blood cell count of 44, slightly elevated lactic acid, and chest x-ray showing multifocal airspace disease concerning for possible infection' her initial blood glucose was 706.  Her corrected sodium for this was roughly 116.  She was admitted to the internal medicine service with working diagnosis of multifocal pneumonia, hyperosmolar hyperglycemic state, acute kidney injury, and hyponatremia.  She was placed on broad-spectrum antibiotics.  She was  found to have a significantly distended abdomen because of this CT abdomen and pelvis was ordered.  General surgery were consulted.  Because of diffuse airspace disease a CT chest was obtained, primary and consulting team a little concerned about possible malignancy. The CT abdomen pelvis was resulted this showed multiple and bilateral pulmonary nodules which were ill-defined.  There was no acute pulmonary emboli.  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  In regards to her CT chest as noted she had bilateral multiple pulmonary nodules all of which were ill-defined.  CT of head was without intracranial hemorrhage, mass lesion or evidence of infarct.  Nephrology was consulted because of severe hyponatremia and acute kidney injury.  Because her hyponatremia was felt to be symptomatic and severe patient was started on 3% saline at 50 mL an hour, with initial goal sodium of 108.  Because of her severe hyponatremia, and concern for clinical deterioration she was asked to transfer to [facility] intensive care under the critical care service  Past Medical History:   Hypertension, class I obesity  Significant Hospital Events:  12/24 admitted with chief complaint of abdominal pain.  Diagnostic imaging from CT abdomen pelvis showing left hydronephrosis, pyelonephritis, and perinephritic abscess.  Blood cultures preliminarily showing gram-negative rods, CT chest with multiple pulmonary nodules.Presenting blood glucose 706, presenting sodium 1 less than 102.  Beta hydroxybutyric acid was negative.  Was given IV hydration.  Started  on empiric antibiotics.  Transferred to [facility]  following urology consultation recommending left nephrostomy tube and perinephritic drain.  Critical care asked to admit 12/25 Sodium improved from 10 2-1 1 4   , 3% saline stopped at 3 AM  Consults:  Urology Nephrology General surgery at any 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  Antimicrobials:  Azithromycin and ceftriaxone 12/23 for 1 dose Zosyn 12/24>> 12/25 12/25 ceftx >>  Interim History / Subjective:  No acute events. Awake but drousy this AM.  Answering questions and following commands. Last Na 128, AM labs pending.  Objective   Blood pressure 114/68, pulse 83, temperature 98.4 F (36.9 C), temperature source Oral, resp. rate 18, height 5\' 2"  (1.575 m), weight 81.1 kg, SpO2 93 %.        Intake/Output Summary (Last 24 hours) at 08/04/2020 0708 Last data filed at 08/03/2020 1900 Gross per 24 hour  Intake 1881.19 ml  Output 1350 ml  Net 531.19 ml   Filed Weights   07/31/20 1702 08/01/20 0000  Weight: 79.4 kg 81.1 kg    Examination:  General: Adult female, resting in bed, in NAD. Neuro: Drousy but easily arouses to voice, follows commands. HEENT: [place]/AT. Sclerae anicteric. Cardiovascular: RRR, no M/R/G.  Lungs: Respirations even and unlabored.  CTA bilaterally, No W/R/R. Abdomen: PCN and perirenal drains intact, no drainage.  BS x 4, soft, NT/ND.  Musculoskeletal: No gross deformities, no edema.  Skin:  Intact, warm, no rashes.  Assessment & Plan:   Severe sepsis with E. coli bacteremia in the setting of left emphysematous pyelonephritis/ with perinephritic abscess, s/p perc nephrostomy and perirenal abscess drainage 12/24 Plan Continue ceftriaxone Hold home antihypertensives High risk of mortality with surgery, urology following  Hyperosmolar nonketotic hyperglycemia Plan Continue IV hydration Continue Lantus 10 units every 12hrs for today, expect insulin requirements to decrease as the infection comes under control  Acute kidney injury - gradually improving.  Underlying renal function unknown.  Has not had medical care for about 30 years Likely at least somewhat exacerbated by left pyelonephritis with abscess Plan Continuing volume resuscitation  Severe hyponatremia Improved appropriately with 3% saline , changed to isotonic bicarb, sodium has increased by 12 last 24 hours which is adequate Plan Continue bicarbonate drip, would ideally like hypotonic bicarb at this point but sodium is rising adequately  Hyperkalemia - s/p repletion Plan Follow BMP  Multiple pulmonary nodules. Differential includes metastatic malignancy especially if primary renal malignancy in play Doubt septic emboli from E. coli Plan May need biopsy eventually, once acute issues resolve Repeat chest x-ray, may need repeat CT at some point   Best practice (evaluated daily)  Diet: Clear liquids, advance as able Pain/Anxiety/Delirium protocol (if indicated): Not indicated VAP protocol (if indicated): Not indicated DVT prophylaxis: Subcutaneous heparin GI prophylaxis: PPI Glucose control: SSI + lantus Mobility: Bedrest Disposition: ICU Code Status: full   CC time: 30 min.   Wilborn Membreno  Celine Mans, [place] [facility] For pager details, please see AMION 08/04/2020, 7:56 AM

## 2020-08-04 NOTE — Progress Notes (Signed)
Lab was called to inquire about sodium labs needed every 4 hours.  Lab said they could not see that order.  Called [facility]-Link and asked that the order for the sodium labs be re-entered.  New order for sodium draw every 4 hours was re-enter.  Lab was called again and they still cannot see the order.  This nurse pulled the sodium lab from the A-line.  Updated sodium lab value needed STAT.  Last sodium lab is from 12 hours ago.

## 2020-08-04 NOTE — Progress Notes (Signed)
Pharmacy Antibiotic Note  Tammie [name] is a 59 y.o. female admitted on 07/31/2020 with bacteremia.  Pharmacy has been consulted for Cefazolin dosing.  Plan: Narrowing abx from Ceftriaxone to Ancef 2gm q12 for E coli bacteremia  Height: 5\' 2"  (157.5 cm) Weight: 81.1 kg (178 lb 12.7 oz) IBW/kg (Calculated) : 50.1  Temp (24hrs), Avg:98.2 F (36.8 C), Min:97.8 F (36.6 C), Max:99.1 F (37.3 C)  Recent Labs  Lab 07/31/20 1758 07/31/20 1913 07/31/20 2155 08/01/20 0641 08/01/20 1033 08/02/20 0300 08/02/20 0400 08/03/20 0412 08/03/20 0445 08/03/20 1319 08/04/20 0115 08/04/20 0745  WBC 44.0*  --   --  49.1*  --   --  47.6* 30.3*  --   --   --  16.3*  CREATININE  --  4.19*   < > 3.82*   < > 3.66*  --   --  3.26* 3.20* 2.90* 2.60*  LATICACIDVEN 2.0* 1.9  --   --   --   --   --   --   --   --   --   --    < > = values in this interval not displayed.    Estimated Creatinine Clearance: 23 mL/min (A) (by C-G formula based on SCr of 2.6 mg/dL (H)).    Allergies  Allergen Reactions  . Codeine Rash   Antimicrobials this admission: Zosyn 12/24 >> 12/24 Azith/CTX 12/23 >> 12/24 CTX 12/23 >> 12/27 Ancef 12/27 >>  Dose adjustments this admission:  Microbiology results: 12/23 BCx: BCID 2/4 E coli, no resistance 12/24 UCx (clean catch): <10k insignificant growth 12/24 UCx (cath): few GNR 12/23 Strep pneumo: neg 12/24 Legionella: pending 12/24 Abscess: moderate GNR 12/24 MRSA PCR: neg   Thank you for allowing pharmacy to be a part of this patient's care.  1/25 PharmD [facility] [phone] 08/04/2020 11:46 AM

## 2020-08-04 NOTE — Progress Notes (Signed)
Urology Inpatient Progress Report       Intv/Subj: No acute events overnight.  She remains afebrile and normotensive.   Principal Problem:   Multifocal pneumonia Active Problems:   Hyperosmolar hyperglycemic state (HHS) (HCC)   Hyponatremia   Hypertension   Acute renal failure (HCC)   Hypokalemia   Severe protein-calorie malnutrition (HCC)   Class 1 obesity   Leukocytosis   Hypomagnesemia   Hyperphosphatemia   Abdominal distention   Lung nodules   Delirium  Current Facility-Administered Medications  Medication Dose Route Frequency Provider Last Rate Last Admin   acetaminophen (TYLENOL) tablet 650 mg  650 mg Oral Q6H PRN Bobette Mo, MD       Or   acetaminophen (TYLENOL) suppository 650 mg  650 mg Rectal Q6H PRN Bobette Mo, MD       albuterol (PROVENTIL) (2.5 MG/3ML) 0.083% nebulizer solution 2.5 mg  2.5 mg Nebulization Q4H PRN Bobette Mo, MD       ceFAZolin (ANCEF) IVPB 2g/100 mL premix  2 g Intravenous Q12H Green, Terri L, RPH       chlorhexidine (PERIDEX) 0.12 % solution 15 mL  15 mL Mouth Rinse BID Johnson, Clanford L, MD   15 mL at 08/04/20 0930   Chlorhexidine Gluconate Cloth 2 % PADS 6 each  6 each Topical Daily Johnson, Clanford L, MD   6 each at 08/03/20 2309   dextrose 50 % solution 0-50 mL  0-50 mL Intravenous PRN Fayrene Helper, PA-C       hydrALAZINE (APRESOLINE) tablet 25 mg  25 mg Oral Q6H PRN Bobette Mo, MD       insulin aspart (novoLOG) injection 0-15 Units  0-15 Units Subcutaneous Q4H Johnson, Clanford L, MD   2 Units at 08/04/20 0030   insulin glargine (LANTUS) injection 10 Units  10 Units Subcutaneous BID Oretha Milch, MD   10 Units at 08/04/20 1033   MEDLINE mouth rinse  15 mL Mouth Rinse q12n4p Johnson, Clanford L, MD   15 mL at 08/03/20 1517   ondansetron (ZOFRAN) tablet 4 mg  4 mg Oral Q6H PRN Bobette Mo, MD       Or   ondansetron [facility]) injection 4 mg  4 mg Intravenous Q6H PRN Bobette Mo, MD       potassium chloride 10 mEq in 100 mL IVPB  10 mEq Intravenous Q1 Hr x 3 Delano Metz, MD   Stopped at 08/04/20 1144   sodium bicarbonate 150 mEq in sterile water 1,000 mL infusion   Intravenous Continuous Delano Metz, MD 50 mL/hr at 08/04/20 1038 Rate Change at 08/04/20 1038   sodium chloride 0.9 % bolus 1,588 mL  20 mL/kg Intravenous Once Fayrene Helper, PA-C       sodium chloride flush (NS) 0.9 % injection 5 mL  5 mL Intracatheter Q8H Oley Balm, MD   5 mL at 08/03/20 2308     Objective: Vital: Vitals:   08/04/20 0500 08/04/20 0600 08/04/20 0700 08/04/20 0800  BP: 121/71 134/60 120/60 (!) 136/56  Pulse: 85 81 84 89  Resp: 18 17 16 12   Temp:    97.8 F (36.6 C)  TempSrc:    Oral  SpO2: 92% 91% 91% 91%  Weight:      Height:       I/Os: I/O last 3 completed shifts: In: 3516.3 [P.O.:240; I.V.:2687.4; IV Piggyback:588.9] Out: 4175 [Urine:3950; Drains:225]  Physical Exam:  General: Patient responds to name and can answer  questions, in and out of sleep though Lungs: Normal respiratory effort, chest expands symmetrically. GI: The abdomen is soft and nontender  GU: LEFT perirenal drain with purulent drainage, LEFT PCN with dark brown urine Foley: yellow urine Ext: lower extremities symmetric  Lab Results: Recent Labs    08/02/20 0400 08/03/20 0412 08/04/20 0745  WBC 47.6* 30.3* 16.3*  HGB 10.4* 10.5* 10.5*  HCT 27.9* 28.3* 29.2*   Recent Labs    08/03/20 1319 08/04/20 0115 08/04/20 0745  NA 126* 128* 129*  K 2.8* 3.3* 3.2*  CL 88* 88* 87*  CO2 GLUCOSE 107* 114* 104*  BUN 99* 93* 87*  CREATININE 3.20* 2.90* 2.60*  CALCIUM 7.8* 8.0* 8.1*   No results for input(s): LABPT, INR in the last 72 hours. No results for input(s): LABURIN in the last 72 hours. Results for orders placed or performed during the hospital encounter of 07/31/20  Culture, blood (single)     Status: Abnormal   Collection Time: 07/31/20  5:26 PM   Specimen:  BLOOD  Result Value Ref Range Status   Specimen Description   Final    BLOOD LEFT ANTECUBITAL Performed at [facility], [address], [place], [place] [address]    Special Requests   Final    BOTTLES DRAWN AEROBIC AND ANAEROBIC Blood Culture adequate volume Performed at [facility], [address], [place], [place] [address]    Culture  Setup Time   Final    GRAM NEGATIVE RODS AEROBIC AND ANEROBIC BOTTLES Gram Stain Report Called to,Read Back By and Verified With: LARIMORE, A. 12/24 @ 0740 BEARD, S.  CRITICAL RESULT CALLED TO, READ BACK BY AND VERIFIED WITH: PHARMD  Lucita Lora [id number] 1921 FCP Performed at [facility], [address] [place], [place] [address]    Culture ESCHERICHIA COLI (A)  Final   Report Status 08/03/2020 FINAL  Final   Organism ID, Bacteria ESCHERICHIA COLI  Final      Susceptibility   Escherichia coli - MIC*    AMPICILLIN <=2 SENSITIVE Sensitive     CEFAZOLIN <=4 SENSITIVE Sensitive     CEFEPIME <=0.12 SENSITIVE Sensitive     CEFTAZIDIME <=1 SENSITIVE Sensitive     CEFTRIAXONE <=0.25 SENSITIVE Sensitive     CIPROFLOXACIN <=0.25 SENSITIVE Sensitive     GENTAMICIN <=1 SENSITIVE Sensitive     IMIPENEM <=0.25 SENSITIVE Sensitive     TRIMETH/SULFA <=20 SENSITIVE Sensitive     AMPICILLIN/SULBACTAM <=2 SENSITIVE Sensitive     PIP/TAZO <=4 SENSITIVE Sensitive     * ESCHERICHIA COLI  Blood Culture ID Panel (Reflexed)     Status: Abnormal   Collection Time: 07/31/20  5:26 PM  Result Value Ref Range Status   Enterococcus faecalis NOT DETECTED NOT DETECTED Final   Enterococcus Faecium NOT DETECTED NOT DETECTED Final   Listeria monocytogenes NOT DETECTED NOT DETECTED Final   Staphylococcus species NOT DETECTED NOT DETECTED Final   Staphylococcus aureus (BCID) NOT DETECTED NOT DETECTED Final   Staphylococcus epidermidis NOT DETECTED NOT DETECTED Final   Staphylococcus lugdunensis NOT DETECTED NOT DETECTED Final   Streptococcus species NOT DETECTED NOT  DETECTED Final   Streptococcus agalactiae NOT DETECTED NOT DETECTED Final   Streptococcus pneumoniae NOT DETECTED NOT DETECTED Final   Streptococcus pyogenes NOT DETECTED NOT DETECTED Final   A.calcoaceticus-baumannii NOT DETECTED NOT DETECTED Final   Bacteroides fragilis NOT DETECTED NOT DETECTED Final   Enterobacterales DETECTED (A) NOT DETECTED Final    Comment: Enterobacterales  represent a large order of gram negative bacteria, not a single organism. CRITICAL RESULT CALLED TO, READ BACK BY AND VERIFIED WITH: PHARMD  TERRY G. [id number] 1921 FCP    Enterobacter cloacae complex NOT DETECTED NOT DETECTED Final   Escherichia coli DETECTED (A) NOT DETECTED Final    Comment: CRITICAL RESULT CALLED TO, READ BACK BY AND VERIFIED WITH: PHARMD  TERRY G. [id number] 1921 FCP    Klebsiella aerogenes NOT DETECTED NOT DETECTED Final   Klebsiella oxytoca NOT DETECTED NOT DETECTED Final   Klebsiella pneumoniae NOT DETECTED NOT DETECTED Final   Proteus species NOT DETECTED NOT DETECTED Final   Salmonella species NOT DETECTED NOT DETECTED Final   Serratia marcescens NOT DETECTED NOT DETECTED Final   Haemophilus influenzae NOT DETECTED NOT DETECTED Final   Neisseria meningitidis NOT DETECTED NOT DETECTED Final   Pseudomonas aeruginosa NOT DETECTED NOT DETECTED Final   Stenotrophomonas maltophilia NOT DETECTED NOT DETECTED Final   Candida albicans NOT DETECTED NOT DETECTED Final   Candida auris NOT DETECTED NOT DETECTED Final   Candida glabrata NOT DETECTED NOT DETECTED Final   Candida krusei NOT DETECTED NOT DETECTED Final   Candida parapsilosis NOT DETECTED NOT DETECTED Final   Candida tropicalis NOT DETECTED NOT DETECTED Final   Cryptococcus neoformans/gattii NOT DETECTED NOT DETECTED Final   CTX-M ESBL NOT DETECTED NOT DETECTED Final   Carbapenem resistance IMP NOT DETECTED NOT DETECTED Final   Carbapenem resistance KPC NOT DETECTED NOT DETECTED Final   Carbapenem resistance NDM NOT DETECTED NOT  DETECTED Final   Carbapenem resist OXA 48 LIKE NOT DETECTED NOT DETECTED Final   Carbapenem resistance VIM NOT DETECTED NOT DETECTED Final    Comment: Performed at [facility], [address] [place], [place] [address]  Resp Panel by RT-PCR (Flu A&B, Covid) Nasopharyngeal Swab     Status: None   Collection Time: 07/31/20  5:39 PM   Specimen: Nasopharyngeal Swab; Nasopharyngeal(NP) swabs in vial transport medium  Result Value Ref Range Status   SARS Coronavirus 2 by RT PCR NEGATIVE NEGATIVE Final    Comment: (NOTE) SARS-CoV-2 target nucleic acids are NOT DETECTED.  The SARS-CoV-2 RNA is generally detectable in upper respiratory specimens during the acute phase of infection. The lowest concentration of SARS-CoV-2 viral copies this assay can detect is 138 copies/mL. A negative result does not preclude SARS-Cov-2 infection and should not be used as the sole basis for treatment or other patient management decisions. A negative result may occur with  improper specimen collection/handling, submission of specimen other than nasopharyngeal swab, presence of viral mutation(s) within the areas targeted by this assay, and inadequate number of viral copies(<138 copies/mL). A negative result must be combined with clinical observations, patient history, and epidemiological information. The expected result is Negative.  Fact Sheet for Patients:  BloggerCourse.com  Fact Sheet for Healthcare Providers:  SeriousBroker.it  This test is no t yet approved or cleared by the [place] FDA and  has been authorized for detection and/or diagnosis of SARS-CoV-2 by FDA under an Emergency Use Authorization (EUA). This EUA will remain  in effect (meaning this test can be used) for the duration of the COVID-19 declaration under Section 564(b)(1) of the Act, 21 U.S.C.section 360bbb-3(b)(1), unless the authorization is terminated  or revoked sooner.        Influenza A by PCR NEGATIVE NEGATIVE Final   Influenza B by PCR NEGATIVE NEGATIVE Final    Comment: (NOTE) The Xpert Xpress SARS-CoV-2/FLU/RSV plus assay is intended as  an aid in the diagnosis of influenza from Nasopharyngeal swab specimens and should not be used as a sole basis for treatment. Nasal washings and aspirates are unacceptable for Xpert Xpress SARS-CoV-2/FLU/RSV testing.  Fact Sheet for Patients: BloggerCourse.com  Fact Sheet for Healthcare Providers: SeriousBroker.it  This test is not yet approved or cleared by the [place] FDA and has been authorized for detection and/or diagnosis of SARS-CoV-2 by FDA under an Emergency Use Authorization (EUA). This EUA will remain in effect (meaning this test can be used) for the duration of the COVID-19 declaration under Section 564(b)(1) of the Act, 21 U.S.C. section 360bbb-3(b)(1), unless the authorization is terminated or revoked.  Performed at [facility], [address], [place], [place] [address]   Culture, blood (single)     Status: None (Preliminary result)   Collection Time: 07/31/20  8:23 PM   Specimen: BLOOD RIGHT WRIST  Result Value Ref Range Status   Specimen Description BLOOD RIGHT WRIST  Final   Special Requests   Final    BOTTLES DRAWN AEROBIC AND ANAEROBIC Blood Culture results may not be optimal due to an excessive volume of blood received in culture bottles   Culture   Final    NO GROWTH 3 DAYS Performed at [facility], [address], [place], [place] [address]    Report Status PENDING  Incomplete  Urine Culture     Status: Abnormal   Collection Time: 08/01/20 11:28 AM   Specimen: Urine, Clean Catch  Result Value Ref Range Status   Specimen Description   Final    URINE, CLEAN CATCH Performed at [facility], [address] [place], [place] [address]    Special Requests   Final    NONE Performed at [facility], [address],  [place], [place] [address]    Culture (A)  Final    <10,000 COLONIES/mL INSIGNIFICANT GROWTH Performed at [facility], [address], [place], [place] [address]    Report Status 08/02/2020 FINAL  Final  MRSA PCR Screening     Status: None   Collection Time: 08/01/20  3:26 PM   Specimen: Nasopharyngeal  Result Value Ref Range Status   MRSA by PCR NEGATIVE NEGATIVE Final    Comment:        The GeneXpert MRSA Assay (FDA approved for NASAL specimens only), is one component of a comprehensive MRSA colonization surveillance program. It is not intended to diagnose MRSA infection nor to guide or monitor treatment for MRSA infections. Performed at [facility], [address], [place], [place] [address]   Aerobic/Anaerobic Culture (surgical/deep wound)     Status: None (Preliminary result)   Collection Time: 08/01/20  7:37 PM   Specimen: Urine, Catheterized  Result Value Ref Range Status   Specimen Description   Final    URINE, CATHETERIZED URINE FROM DRAIN Performed at [facility], [address] [place], [place] [address]    Special Requests   Final    NONE Performed at [facility], [address], [place], [place] [address]    Gram Stain   Final    ABUNDANT WBC PRESENT, PREDOMINANTLY PMN RARE GRAM NEGATIVE RODS RARE GRAM POSITIVE RODS    Culture   Final    FEW ESCHERICHIA COLI CULTURE REINCUBATED FOR BETTER GROWTH Performed at [facility], [address], [place], [place] [address]    Report Status PENDING  Incomplete   Organism ID, Bacteria ESCHERICHIA COLI  Final  Susceptibility   Escherichia coli - MIC*    AMPICILLIN 4 SENSITIVE Sensitive     CEFAZOLIN <=4 SENSITIVE Sensitive     CEFEPIME <=0.12 SENSITIVE Sensitive     CEFTRIAXONE <=0.25 SENSITIVE Sensitive     CIPROFLOXACIN <=0.25 SENSITIVE Sensitive     GENTAMICIN <=1 SENSITIVE Sensitive     IMIPENEM <=0.25 SENSITIVE Sensitive      NITROFURANTOIN <=16 SENSITIVE Sensitive     TRIMETH/SULFA <=20 SENSITIVE Sensitive     AMPICILLIN/SULBACTAM <=2 SENSITIVE Sensitive     PIP/TAZO <=4 SENSITIVE Sensitive     * FEW ESCHERICHIA COLI  Aerobic/Anaerobic Culture (surgical/deep wound)     Status: None (Preliminary result)   Collection Time: 08/01/20  7:38 PM   Specimen: Abscess  Result Value Ref Range Status   Specimen Description   Final    ABSCESS Performed at [facility], [address] [place], [place] [address]    Special Requests   Final    NONE Performed at [facility], [address] [place], [place] [address]    Gram Stain   Final    ABUNDANT WBC PRESENT, PREDOMINANTLY PMN MODERATE GRAM NEGATIVE RODS    Culture   Final    ABUNDANT GRAM NEGATIVE RODS CULTURE REINCUBATED FOR BETTER GROWTH Performed at [facility], [address] [place], [place] [address]    Report Status PENDING  Incomplete    Studies/Results: DG Chest Port 1 View  Result Date: 08/04/2020 CLINICAL DATA:  59 year old female with respiratory failure, sepsis, emphysematous left renal pyelonephritis status post CT-guided nephrostomy, abscess drain. EXAM: PORTABLE CHEST 1 VIEW COMPARISON:  CT Chest, Abdomen, and Pelvis 08/01/2020 and earlier. FINDINGS: Portable AP semi upright view at 0541 hours. Lower lung volumes. Stable cardiac size and mediastinal contours. Increased left lung base opacity. Additional patchy and nodular bilateral pulmonary opacity better demonstrated by CT and not significantly changed. No pneumothorax. No pulmonary edema. No right pleural effusion. Visible bowel-gas within normal limits. No acute osseous abnormality identified. IMPRESSION: 1. Lower lung volumes with increased left lung base opacity which could be related to combination of pleural effusion, atelectasis, or pneumonia. 2. Scattered patchy and nodular lung opacity elsewhere stable and suspicious for hematogenous  disseminated infection (see CT 08/01/2020). Electronically Signed   By: Odessa FlemingH  Hall M.D.   On: 08/04/2020 05:58    Assessment: 59 year old woman with severe left emphysematous pyelonephritis, 4 mm left UPJ calculus and clinical signs of severe sepsis with admission labs WBC 44, hyponatremia, and glucose 706 now clinically improving.  1. Emphysematous pyelonephritis: -antibiotics have been narrowed according to urine culture  -Recommend foley for maximum decompression of infected urine -Continue LEFT perirenal drain and LEFT PCN tube -greatly appreciate IR evaluation and treatment -would like to reimage left kidney at some point once creatinine stabilizes -Given the severity of infection and metabolic derrangements, I discussed significantly increased risk of mortality with more aggressive measures such as nephrectomy -Will continue to monitor closely  2. UPJ calculus with mild obstructive uropathy and AKI: -Creatinine continues to improve with Left PCN drain -will continue to trend Cr and plan for reimaging in future   Kasandra KnudsenMaryEllen Chasady Longwell, MD Urology 08/04/2020, 12:10 PM

## 2020-08-04 NOTE — Progress Notes (Addendum)
Patient ID: Tammie [name], female   DOB: 11/22/1960, 59 y.o.   MRN: [id number] [facility] Progress Note   Assessment/ Plan:   1. Acute kidney Injury (with unknown baseline renal function): This appears to be from a combination of obstructive and ATN mechanism (with emphysematous pyelonephritis/sepsis).  She is nonoliguric with slow/gradual downtrend of creatinine SP L PCN and perinephric along with antibiotic therapy for pyelonephritis. 2.  Hyponatremia: Sodium level improved sp 3% saline and now getting bicarb IVF"s at 75 cc/ hr. Cont for now 3.  Sepsis with left emphysematous pyelonephritis/UPJ stone: Status post left sided percutaneous nephrostomy with perirenal abscess drainage on 12/24.  On antibiotic coverage with ceftriaxone with improving leukocytosis.  She remains afebrile. 4.  Hypokalemia: Likely secondary to total body deficit/poor intake and now with transcellular shifts in the setting of sodium bicarbonate treatment.  Will replace via intravenous route as mental status may not be reliable/adequate enough for oral therapy. 5.  Anion gap metabolic acidosis: Secondary to acute kidney injury/lactic acidosis of sepsis-ongoing treatment of underlying sepsis with antibiotic therapy/percutaneous drainage and on isotonic sodium bicarbonate.  Subjective:   No c/o's today, very tired   Objective:   BP (!) 136/56 (BP Location: Right Arm)   Pulse 89   Temp 97.8 F (36.6 C) (Oral)   Resp 12   Ht 5\' 2"  (1.575 m)   Wt 81.1 kg   LMP  (LMP Unknown)   SpO2 91%   BMI 32.70 kg/m   Intake/Output Summary (Last 24 hours) at 08/04/2020 0947 Last data filed at 08/04/2020 0700 Gross per 24 hour  Intake 1881.19 ml  Output 2350 ml  Net -468.81 ml   Weight change:   Physical Exam: Gen: very tired, responds to simple commands CVS: Pulse regular rhythm, normal rate, S1 and S2 normal Resp: Clear to auscultation bilaterally, no distinct rales or rhonchi Abd: Soft, obese, nontender,  bowel sounds normal.  Percutaneous drains x 2 on left Ext: Trace lower extremity edema  Imaging: DG Chest Port 1 View  Result Date: 08/04/2020 CLINICAL DATA:  59 year old female with respiratory failure, sepsis, emphysematous left renal pyelonephritis status post CT-guided nephrostomy, abscess drain. EXAM: PORTABLE CHEST 1 VIEW COMPARISON:  CT Chest, Abdomen, and Pelvis 08/01/2020 and earlier. FINDINGS: Portable AP semi upright view at 0541 hours. Lower lung volumes. Stable cardiac size and mediastinal contours. Increased left lung base opacity. Additional patchy and nodular bilateral pulmonary opacity better demonstrated by CT and not significantly changed. No pneumothorax. No pulmonary edema. No right pleural effusion. Visible bowel-gas within normal limits. No acute osseous abnormality identified. IMPRESSION: 1. Lower lung volumes with increased left lung base opacity which could be related to combination of pleural effusion, atelectasis, or pneumonia. 2. Scattered patchy and nodular lung opacity elsewhere stable and suspicious for hematogenous disseminated infection (see CT 08/01/2020). Electronically Signed   By: 08/03/2020 M.D.   On: 08/04/2020 05:58    Labs: BMET Recent Labs  Lab 08/01/20 0217 08/01/20 0641 08/01/20 1033 08/01/20 1136 08/02/20 0300 08/02/20 0740 08/02/20 1149 08/02/20 1600 08/02/20 2357 08/03/20 0445 08/03/20 1319 08/04/20 0115 08/04/20 0745  NA 105* 104* 103*   < > 112*   < > 115* 118* 121* 124* 126* 128* 129*  K 2.9* 3.1* 3.6  --  3.4*  --   --   --   --  2.7* 2.8* 3.3* 3.2*  CL 74* 75* 75*  --  83*  --   --   --   --  87* 88* 88* 87*  CO2 12* 14* 13*  --  14*  --   --   --   --  20* 24 28 29   GLUCOSE 227* 205* 151*  --  153*  --   --   --   --  92 107* 114* 104*  BUN 108* 114* 108*  --  113*  --   --   --   --  103* 99* 93* 87*  CREATININE 4.13* 3.82* 3.79*  --  3.66*  --   --   --   --  3.26* 3.20* 2.90* 2.60*  CALCIUM 7.6* 7.7* 7.8*  --  7.9*  --   --   --    --  7.7* 7.8* 8.0* 8.1*  PHOS 6.2*  --   --   --   --   --   --   --   --  6.7*  --   --   --    < > = values in this interval not displayed.   CBC Recent Labs  Lab 07/31/20 1758 08/01/20 0641 08/02/20 0400 08/03/20 0412 08/04/20 0745  WBC 44.0* 49.1* 47.6* 30.3* 16.3*  NEUTROABS 40.1* 46.2* 43.5* 27.3*  --   HGB 12.6 11.4* 10.4* 10.5* 10.5*  HCT 34.3* 30.9* 27.9* 28.3* 29.2*  MCV 83.1 82.2 83.3 83.7 86.9  PLT 225 206 237 273 308   Medications:    . chlorhexidine  15 mL Mouth Rinse BID  . Chlorhexidine Gluconate Cloth  6 each Topical Daily  . insulin aspart  0-15 Units Subcutaneous Q4H  . insulin glargine  10 Units Subcutaneous BID  . mouth rinse  15 mL Mouth Rinse q12n4p  . sodium chloride flush  5 mL Intracatheter Q8H

## 2020-08-04 NOTE — Progress Notes (Signed)
Referring Physician(s): Dr. Arita MissPace  Supervising Physician: Simonne ComeWatts, John  [facility]  [facility]  Chief Complaint: Emphysematous pyelonephritis  Subjective: Lethargic, but arousable. Engages during assessment.  States she feel "ok."  Allergies: Codeine  Medications: Prior to Admission medications   Medication Sig Start Date End Date Taking? Authorizing Provider  acetaminophen (TYLENOL) 500 MG tablet Take 1,000 mg by mouth every 6 (six) hours as needed.   Yes [provider]  Pseudoeph-Doxylamine-DM-APAP (NYQUIL PO) Take 1 tablet by mouth daily as needed (congestion).   Yes [provider]     Vital Signs: BP (!) 121/52   Pulse 89   Temp 99.6 F (37.6 C) (Axillary)   Resp 18   Ht 5\' 2"  (1.575 m)   Wt 178 lb 12.7 oz (81.1 kg)   LMP  (LMP Unknown)   SpO2 95%   BMI 32.70 kg/m   Physical Exam  Abdomen: non-tender, non-distended.  Left PCN in place with dark, cloudy urine.  Left abscess drain with thick, purulent-appearing, bloody output.  Flushes easily.   Imaging: CT ABDOMEN PELVIS WO CONTRAST  Result Date: 08/01/2020 CLINICAL DATA:  Abnormal chest radiograph, lung nodule, generalized abdominal pain and distension, confusion, no medical care in years EXAM: CT CHEST, ABDOMEN AND PELVIS WITHOUT CONTRAST TECHNIQUE: Multidetector CT imaging of the chest, abdomen and pelvis was performed following the standard protocol without IV contrast. IV contrast not administered due to renal dysfunction. Sagittal and coronal MPR images reconstructed from axial data set. No oral contrast administered. 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 Minimal pericardial fluid. Heart otherwise unremarkable. Mediastinum/Nodes: Questionable thickening of esophagus versus artifact from underdistention. No thoracic adenopathy identified. Base of cervical region  normal appearance. Lungs/Pleura: Multiple BILATERAL pulmonary nodules up to 12 mm diameter. A more confluent area of opacity is seen in the posterior segment of the RIGHT upper lobe, 4.0 x 3.0 cm. Some of the nodules appear ill-defined and slightly shaggy a. These could represent metastatic foci or septic emboli. No definite cavitary lesions are seen. Dependent atelectasis LEFT lower lobe. No pulmonary infiltrate, pleural effusion, or pneumothorax. Musculoskeletal: Scattered endplate spur formation thoracic spine. No sclerotic or destructive osseous lesions. CT ABDOMEN PELVIS FINDINGS Hepatobiliary: Gallbladder and liver normal appearance Pancreas: Atrophic, otherwise unremarkable Spleen: Normal appearance Adrenals/Urinary Tract: Adrenal glands normal appearance. Minimal collecting system dilatation RIGHT kidney without mass or calcification. RIGHT ureter decompressed. Abnormal appearance of LEFT kidney, which is diffusely enlarged and demonstrates significant perinephric edema. In addition, significant gas is seen in the perinephric space and questionably subcapsular, minimally in the collecting system, consistent with emphysematous pyelonephritis. Edema/fluid is seen extending along the lateral conal fascia and the anterior and posterior pararenal fascia. A gas and fluid collection is identified inferior to the LEFT kidney, 6.4 x 3.6 x 5.9 cm consistent with abscess. 4 mm calculus at LEFT ureteropelvic junction with mild LEFT hydronephrosis. Small amount of air within bladder though this could be related to prior catheterization. Stomach/Bowel: Stomach and bowel loops normal appearance. Edema from the LEFT renal process extends to the lateral conal fascia and adjacent to the descending colon but the descending colon does not appear to be thickened or involved. Vascular/Lymphatic: Minimal atherosclerotic calcification aorta. Aorta normal caliber. No definite adenopathy. Reproductive: Unremarkable uterus and adnexa  Other: No free air or free fluid. LEFT retroperitoneal edema the posterior pararenal space extending into upper pelvis. No hernia. Musculoskeletal: No  acute osseous findings. IMPRESSION: Multiple BILATERAL pulmonary nodules up to 12 mm diameter, some of which appear ill-defined and slightly shaggy. These could represent metastatic foci or septic emboli. Correlation with cardiology recommended to exclude source of septic embolism. 4 mm LEFT UPJ calculus with mild LEFT hydronephrosis and hydroureter. 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 Recommend annual imaging followup by CTA or MRA. This recommendation follows 2010 ACCF/AHA/AATS/ACR/ASA/SCA/SCAI/SIR/STS/SVM Guidelines for the Diagnosis and Management of Patients with Thoracic Aortic Disease. Circulation. 2010; 121: B762-G315. Aortic aneurysm NOS (ICD10-I71.9) Aortic Atherosclerosis (ICD10-I70.0) Findings discussed with Dr. Henreitta Leber at time of exam on 08/01/2020. Electronically Signed   By: Ulyses Southward M.D.   On: 08/01/2020 11:34   CT HEAD WO CONTRAST  Result Date: 08/01/2020 CLINICAL DATA:  Disorientation, abnormal chest radiograph with pulmonary nodules, hyponatremia EXAM: CT HEAD WITHOUT CONTRAST TECHNIQUE: Contiguous axial images were obtained from the base of the skull through the vertex without intravenous contrast. Sagittal and coronal MPR images reconstructed from axial data set. COMPARISON:  None FINDINGS: Brain: Motion artifacts on initial imaging, for which repeat imaging was performed. Normal ventricular morphology. No midline shift or mass effect. Normal appearance of brain parenchyma. No intracranial hemorrhage, mass lesion, or evidence of acute infarction. Vascular: No hyperdense vessels Skull: Intact Sinuses/Orbits: Clear Other: N/A IMPRESSION: No acute  intracranial abnormalities. Electronically Signed   By: Ulyses Southward M.D.   On: 08/01/2020 11:36   CT CHEST WO CONTRAST  Result Date: 08/01/2020 CLINICAL DATA:  Abnormal chest radiograph, lung nodule, generalized abdominal pain and distension, confusion, no medical care in years EXAM: CT CHEST, ABDOMEN AND PELVIS WITHOUT CONTRAST TECHNIQUE: Multidetector CT imaging of the chest, abdomen and pelvis was performed following the standard protocol without IV contrast. IV contrast not administered due to renal dysfunction. Sagittal and coronal MPR images reconstructed from axial data set. No oral contrast administered. 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 Minimal pericardial fluid. Heart otherwise unremarkable. Mediastinum/Nodes: Questionable thickening of esophagus versus artifact from underdistention. No thoracic adenopathy identified. Base of cervical region normal appearance. Lungs/Pleura: Multiple BILATERAL pulmonary nodules up to 12 mm diameter. A more confluent area of opacity is seen in the posterior segment of the RIGHT upper lobe, 4.0 x 3.0 cm. Some of the nodules appear ill-defined and slightly shaggy a. These could represent metastatic foci or septic emboli. No definite cavitary lesions are seen. Dependent atelectasis LEFT lower lobe. No pulmonary infiltrate, pleural effusion, or pneumothorax. Musculoskeletal: Scattered endplate spur formation thoracic spine. No sclerotic or destructive osseous lesions. CT ABDOMEN PELVIS FINDINGS Hepatobiliary: Gallbladder and liver normal appearance Pancreas: Atrophic, otherwise unremarkable Spleen: Normal appearance Adrenals/Urinary Tract: Adrenal glands normal appearance. Minimal collecting system dilatation RIGHT kidney without mass or calcification. RIGHT ureter decompressed. Abnormal appearance of LEFT kidney, which is diffusely enlarged and  demonstrates significant perinephric edema. In addition, significant gas is seen in the perinephric space and questionably subcapsular, minimally in the collecting system, consistent with emphysematous pyelonephritis. Edema/fluid is seen extending along the lateral conal fascia and the anterior and posterior pararenal fascia. A gas and fluid collection is identified inferior to the LEFT kidney, 6.4 x 3.6 x 5.9 cm consistent with abscess. 4 mm calculus at LEFT ureteropelvic junction with mild LEFT hydronephrosis. Small amount of air within bladder though  this could be related to prior catheterization. Stomach/Bowel: Stomach and bowel loops normal appearance. Edema from the LEFT renal process extends to the lateral conal fascia and adjacent to the descending colon but the descending colon does not appear to be thickened or involved. Vascular/Lymphatic: Minimal atherosclerotic calcification aorta. Aorta normal caliber. No definite adenopathy. Reproductive: Unremarkable uterus and adnexa Other: No free air or free fluid. LEFT retroperitoneal edema the posterior pararenal space extending into upper pelvis. No hernia. Musculoskeletal: No acute osseous findings. IMPRESSION: Multiple BILATERAL pulmonary nodules up to 12 mm diameter, some of which appear ill-defined and slightly shaggy. These could represent metastatic foci or septic emboli. Correlation with cardiology recommended to exclude source of septic embolism. 4 mm LEFT UPJ calculus with mild LEFT hydronephrosis and hydroureter. 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 Recommend annual imaging followup by CTA or MRA. This recommendation follows 2010 ACCF/AHA/AATS/ACR/ASA/SCA/SCAI/SIR/STS/SVM Guidelines for the Diagnosis and Management of Patients with Thoracic Aortic Disease.  Circulation. 2010; 121: Z610-R604. Aortic aneurysm NOS (ICD10-I71.9) Aortic Atherosclerosis (ICD10-I70.0) Findings discussed with Dr. Henreitta Leber at time of exam on 08/01/2020. Electronically Signed   By: Ulyses Southward M.D.   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 EXAM: PORTABLE CHEST 1 VIEW COMPARISON:  CT Chest, Abdomen, and Pelvis 08/01/2020 and earlier. FINDINGS: Portable AP semi upright view at 0541 hours. Lower lung volumes. Stable cardiac size and mediastinal contours. Increased left lung base opacity. Additional patchy and nodular bilateral pulmonary opacity better demonstrated by CT and not significantly changed. No pneumothorax. No pulmonary edema. No right pleural effusion. Visible bowel-gas within normal limits. No acute osseous abnormality identified. IMPRESSION: 1. Lower lung volumes with increased left lung base opacity which could be related to combination of pleural effusion, atelectasis, or pneumonia. 2. Scattered patchy and nodular lung opacity elsewhere stable and suspicious for hematogenous disseminated infection (see CT 08/01/2020). Electronically Signed   By: Odessa Fleming M.D.   On: 08/04/2020 05:58   DG Chest Portable 1 View  Result Date: 07/31/2020 CLINICAL DATA:  Delirium EXAM: PORTABLE CHEST 1 VIEW COMPARISON:  None. FINDINGS: The heart size and mediastinal contours are within normal limits. Elevation of left hemidiaphragm Bilateral patchy airspace opacities. No pulmonary edema. No definite pleural effusion. No pneumothorax. No acute osseous abnormality. IMPRESSION: Multifocal airspace opacities that could represent infection or inflammation. COVID-19 infection not excluded. Followup PA and lateral chest X-ray is recommended in 3-4 weeks following therapy to ensure resolution and exclude underlying  malignancy. Electronically Signed   By: Tish Frederickson M.D.   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 The antibiotics were administered within an appropriate time frame prior to the initiation of the procedure. ANESTHESIA/SEDATION: Intravenous Fentanyl and Versed  were administered as conscious sedation during continuous monitoring of the patient's level of consciousness and physiological / cardiorespiratory status by the radiology RN, with a total moderate sedation time of 42 minutes. COMPLICATIONS: None immediate. PROCEDURE: Informed written consent was obtained from  the patient after a thorough discussion of the procedural risks, benefits and alternatives. All questions were addressed. Maximal Sterile Barrier Technique was utilized including caps, mask, sterile gowns, sterile gloves, sterile drape, hand hygiene and skin antiseptic. A timeout was performed prior to the initiation of the procedure. Patient placed in right anterior oblique position and a limited axial scanning through the abdomen pelvis was performed. Appropriate skin entry sites were identified and marked, then prepped with chlorhexidine, draped in usual sterile fashion, infiltrated locally with 1% lidocaine. Under CT fluoroscopic guidance, a 21 gauge trocar needle advanced into a posterior lower pole calyx of the left renal collecting system. 018 guidewire advanced centrally without resistance, confirmed on CT. The transitional dilator was utilized to allow parallel placement of a 038 stiff Amplatz wire. Position confirmed on CT. Tract was dilated to facilitate placement 10 French  pigtail drain catheter, formed centrally within the left renal collecting system. 5 mL of purulent urine were aspirated, sent for Gram stain and culture. In similar fashion, the left retroperitoneal pelvic gas and fluid collection anterior to the psoas was localized. 18 gauge trocar needle was advanced to the collection. Purulent material could be aspirated. A new sterile Amplatz wire advanced easily into the collection, position confirmed on CT. Tract dilated to facilitate placement 12 French pigtail drain catheter, formed centrally within the collection. 10 mL of purulent material were aspirated, sent for Gram stain and culture. Both catheters were secured externally with 0 Prolene sutures and StatLock devices and placed to gravity drain bags. The patient tolerated the procedure well. IMPRESSION: 1. Technically successful left nephrostomy catheter placement with CT guidance. 2. Technically successful left pelvic abscess drain catheter placement with CT guidance. 3. Aspirates from both sites were sent for Gram stain and culture. Electronically Signed   By: Corlis Leak M.D.   On: 08/02/2020 08:25   CT IMAGE GUIDED DRAINAGE BY PERCUTANEOUS CATHETER  Result Date: 08/02/2020 INDICATION: Left emphysematous pyelonephritis with obstructing calculus and retroperitoneal abscess EXAM: CT-GUIDED LEFT NEPHROSTOMY PLACEMENT CT-GUIDED PELVIC ABSCESS DRAIN CATHETER PLACEMENT MEDICATIONS: The patient is currently admitted to the hospital and receiving intravenous antibiotics. The antibiotics were administered within an appropriate time frame prior to the initiation of the procedure. ANESTHESIA/SEDATION: Intravenous Fentanyl and Versed 1mg  were administered as conscious sedation during continuous monitoring of the patient's level of consciousness and physiological / cardiorespiratory status by the radiology RN, with a total moderate sedation time of 42 minutes. COMPLICATIONS: None immediate. PROCEDURE: Informed written  consent was obtained from the patient after a thorough discussion of the procedural risks, benefits and alternatives. All questions were addressed. Maximal Sterile Barrier Technique was utilized including caps, mask, sterile gowns, sterile gloves, sterile drape, hand hygiene and skin antiseptic. A timeout was performed prior to the initiation of the procedure. Patient placed in right anterior oblique position and a limited axial scanning through the abdomen pelvis was performed. Appropriate skin entry sites were identified and marked, then prepped with chlorhexidine, draped in usual sterile fashion, infiltrated locally with 1% lidocaine. Under CT fluoroscopic guidance, a 21 gauge trocar needle advanced into a posterior lower pole calyx of the left renal collecting system. 018 guidewire advanced centrally without resistance, confirmed on CT. The transitional dilator was utilized to allow parallel placement of a 038 stiff Amplatz wire. Position confirmed on CT. Tract was dilated to facilitate placement 10 French pigtail drain catheter, formed centrally within the left renal collecting system. 5 mL of purulent urine were aspirated, sent for Gram stain and culture.  In similar fashion, the left retroperitoneal pelvic gas and fluid collection anterior to the psoas was localized. 18 gauge trocar needle was advanced to the collection. Purulent material could be aspirated. A new sterile Amplatz wire advanced easily into the collection, position confirmed on CT. Tract dilated to facilitate placement 12 French pigtail drain catheter, formed centrally within the collection. 10 mL of purulent material were aspirated, sent for Gram stain and culture. Both catheters were secured externally with 0 Prolene sutures and StatLock devices and placed to gravity drain bags. The patient tolerated the procedure well. IMPRESSION: 1. Technically successful left nephrostomy catheter placement with CT guidance. 2. Technically successful left  pelvic abscess drain catheter placement with CT guidance. 3. Aspirates from both sites were sent for Gram stain and culture. Electronically Signed   By: Corlis Leak M.D.   On: 08/02/2020 08:25    Labs:  CBC: Recent Labs    08/01/20 0641 08/02/20 0400 08/03/20 0412 08/04/20 0745  WBC 49.1* 47.6* 30.3* 16.3*  HGB 11.4* 10.4* 10.5* 10.5*  HCT 30.9* 27.9* 28.3* 29.2*  PLT 206 237 273 308    COAGS: No results for input(s): INR, APTT in the last 8760 hours.  BMP: Recent Labs    08/03/20 0445 08/03/20 1319 08/04/20 0115 08/04/20 0745  NA 124* 126* 128* 129*  K 2.7* 2.8* 3.3* 3.2*  CL 87* 88* 88* 87*  CO2 20* 24 28 29   GLUCOSE 92 107* 114* 104*  BUN 103* 99* 93* 87*  CALCIUM 7.7* 7.8* 8.0* 8.1*  CREATININE 3.26* 3.20* 2.90* 2.60*  GFRNONAA 16* 16* 18* 21*    LIVER FUNCTION TESTS: Recent Labs    07/31/20 1913 08/01/20 0641  BILITOT 1.3* 0.9  AST 14* 19  ALT 16 19  ALKPHOS 147* 142*  PROT 5.4* 5.4*  ALBUMIN 2.0* 2.1*    Assessment and Plan: Emphysematous pyelonephritis s/p left PCN and left perinephritic abscess drain placement 08/01/20 Patient assessed at bedside.  Drains intact.  Abscess drain (labeled #2) with bloody, thick output. Flushes easily. PCN with dark urine.  WBC trend improving 49.1 (peak, 4 days ago)  16.3 Fever curve trending up-- 100F this afternoon.  Continue with current drain care including regular flushes.  Discussed with RN.   Electronically Signed: 08/03/20, PA 08/04/2020, 4:15 PM   I spent a total of 15 Minutes at the the patient's bedside AND on the patient's hospital floor or unit, greater than 50% of which was counseling/coordinating care for emphysematous pyelonephritis.

## 2020-08-04 NOTE — Plan of Care (Signed)
  Problem: Clinical Measurements: Goal: Will remain free from infection Outcome: Progressing Goal: Respiratory complications will improve Outcome: Progressing   Problem: Coping: Goal: Level of anxiety will decrease Outcome: Progressing   Problem: Elimination: Goal: Will not experience complications related to bowel motility Outcome: Progressing   Problem: Pain Managment: Goal: General experience of comfort will improve Outcome: Progressing   Problem: Safety: Goal: Ability to remain free from injury will improve Outcome: Progressing

## 2020-08-05 DIAGNOSIS — N151 Renal and perinephric abscess: Secondary | ICD-10-CM | POA: Diagnosis not present

## 2020-08-05 DIAGNOSIS — Z978 Presence of other specified devices: Secondary | ICD-10-CM | POA: Diagnosis not present

## 2020-08-05 DIAGNOSIS — Z936 Other artificial openings of urinary tract status: Secondary | ICD-10-CM | POA: Diagnosis not present

## 2020-08-05 DIAGNOSIS — N10 Acute tubulo-interstitial nephritis: Secondary | ICD-10-CM | POA: Diagnosis not present

## 2020-08-05 LAB — GLUCOSE, CAPILLARY
Glucose-Capillary: 110 mg/dL — ABNORMAL HIGH (ref 70–99)
Glucose-Capillary: 114 mg/dL — ABNORMAL HIGH (ref 70–99)
Glucose-Capillary: 121 mg/dL — ABNORMAL HIGH (ref 70–99)
Glucose-Capillary: 135 mg/dL — ABNORMAL HIGH (ref 70–99)
Glucose-Capillary: 154 mg/dL — ABNORMAL HIGH (ref 70–99)
Glucose-Capillary: 195 mg/dL — ABNORMAL HIGH (ref 70–99)

## 2020-08-05 LAB — CBC
HCT: 30.5 % — ABNORMAL LOW (ref 36.0–46.0)
Hemoglobin: 10.4 g/dL — ABNORMAL LOW (ref 12.0–15.0)
MCH: 30.5 pg (ref 26.0–34.0)
MCHC: 34.1 g/dL (ref 30.0–36.0)
MCV: 89.4 fL (ref 80.0–100.0)
Platelets: 315 10*3/uL (ref 150–400)
RBC: 3.41 MIL/uL — ABNORMAL LOW (ref 3.87–5.11)
RDW: 15.2 % (ref 11.5–15.5)
WBC: 16 10*3/uL — ABNORMAL HIGH (ref 4.0–10.5)
nRBC: 0 % (ref 0.0–0.2)

## 2020-08-05 LAB — BASIC METABOLIC PANEL
Anion gap: 14 (ref 5–15)
BUN: 74 mg/dL — ABNORMAL HIGH (ref 6–20)
CO2: 30 mmol/L (ref 22–32)
Calcium: 8.1 mg/dL — ABNORMAL LOW (ref 8.9–10.3)
Chloride: 89 mmol/L — ABNORMAL LOW (ref 98–111)
Creatinine, Ser: 1.84 mg/dL — ABNORMAL HIGH (ref 0.44–1.00)
GFR, Estimated: 31 mL/min — ABNORMAL LOW (ref >60)
Glucose, Bld: 146 mg/dL — ABNORMAL HIGH (ref 70–99)
Potassium: 3.1 mmol/L — ABNORMAL LOW (ref 3.5–5.1)
Sodium: 133 mmol/L — ABNORMAL LOW (ref 135–145)

## 2020-08-05 LAB — MAGNESIUM: Magnesium: 1.8 mg/dL (ref 1.7–2.4)

## 2020-08-05 LAB — CULTURE, BLOOD (SINGLE): Culture: NO GROWTH

## 2020-08-05 LAB — LEGIONELLA PNEUMOPHILA SEROGP 1 UR AG: L. pneumophila Serogp 1 Ur Ag: NEGATIVE

## 2020-08-05 LAB — PHOSPHORUS: Phosphorus: 4.1 mg/dL (ref 2.5–4.6)

## 2020-08-05 MED ORDER — INSULIN ASPART 100 UNIT/ML ~~LOC~~ SOLN: 0.0000 [IU] | Freq: Every day | SUBCUTANEOUS | Status: DC

## 2020-08-05 MED ORDER — INSULIN ASPART 100 UNIT/ML ~~LOC~~ SOLN: 6.0000 [IU] | Freq: Three times a day (TID) | SUBCUTANEOUS | Status: DC

## 2020-08-05 MED ORDER — HEPARIN SODIUM (PORCINE) 5000 UNIT/ML IJ SOLN
5000.0000 [IU] | Freq: Three times a day (TID) | INTRAMUSCULAR | Status: DC
  Administered 2020-08-05 – 2020-08-13 (×25): 5000 [IU] via SUBCUTANEOUS
  Filled 2020-08-05 (×24): qty 1

## 2020-08-05 MED ORDER — LACTATED RINGERS IV SOLN: INTRAVENOUS | Status: DC

## 2020-08-05 MED ORDER — INSULIN ASPART 100 UNIT/ML ~~LOC~~ SOLN
0.0000 [IU] | Freq: Three times a day (TID) | SUBCUTANEOUS | Status: DC
  Administered 2020-08-06: 13:00:00 3 [IU] via SUBCUTANEOUS
  Administered 2020-08-07: 17:00:00 7 [IU] via SUBCUTANEOUS
  Administered 2020-08-07: 12:00:00 4 [IU] via SUBCUTANEOUS
  Administered 2020-08-09: 7 [IU] via SUBCUTANEOUS
  Administered 2020-08-09: 4 [IU] via SUBCUTANEOUS
  Administered 2020-08-10: 3 [IU] via SUBCUTANEOUS
  Administered 2020-08-10: 4 [IU] via SUBCUTANEOUS
  Administered 2020-08-11: 3 [IU] via SUBCUTANEOUS
  Administered 2020-08-11 (×2): 4 [IU] via SUBCUTANEOUS
  Administered 2020-08-12: 3 [IU] via SUBCUTANEOUS
  Administered 2020-08-13: 7 [IU] via SUBCUTANEOUS
  Administered 2020-08-14: 3 [IU] via SUBCUTANEOUS
  Administered 2020-08-15: 4 [IU] via SUBCUTANEOUS
  Administered 2020-08-16 – 2020-08-18 (×5): 3 [IU] via SUBCUTANEOUS

## 2020-08-05 MED ORDER — LIVING WELL WITH DIABETES BOOK
Freq: Once | Status: AC
  Filled 2020-08-05: qty 1

## 2020-08-05 MED ORDER — CEFAZOLIN SODIUM-DEXTROSE 2-4 GM/100ML-% IV SOLN
2.0000 g | Freq: Three times a day (TID) | INTRAVENOUS | Status: DC
  Administered 2020-08-05 – 2020-08-19 (×43): 2 g via INTRAVENOUS
  Filled 2020-08-05 (×46): qty 100

## 2020-08-05 MED ORDER — POTASSIUM CHLORIDE 10 MEQ/100ML IV SOLN
10.0000 meq | INTRAVENOUS | Status: DC
  Filled 2020-08-05: qty 100

## 2020-08-05 MED ORDER — INSULIN STARTER KIT- PEN NEEDLES (ENGLISH)
1.0000 | Freq: Once | Status: AC
  Administered 2020-08-05: 16:00:00 1
  Filled 2020-08-05: qty 1

## 2020-08-05 MED ORDER — POTASSIUM CHLORIDE 20 MEQ PO PACK
40.0000 meq | PACK | Freq: Three times a day (TID) | ORAL | Status: AC
  Administered 2020-08-05 (×3): 40 meq via ORAL
  Filled 2020-08-05 (×3): qty 2

## 2020-08-05 NOTE — Progress Notes (Signed)
Inpatient Diabetes Program Recommendations  AACE/ADA: New Consensus Statement on Inpatient Glycemic Control (2015)  Target Ranges:  Prepandial:   less than 140 mg/dL      Peak postprandial:   less than 180 mg/dL (1-2 hours)      Critically ill patients:  140 - 180 mg/dL   Lab Results  Component Value Date   GLUCAP 154 (H) 08/05/2020   HGBA1C 12.2 (H) 08/01/2020    Review of Glycemic Control  Diabetes history: New-onset Outpatient Diabetes medications: None Current orders for Inpatient glycemic control: Lantus 10 units bid, Novolog 0-15 units Q4H.  HgbA1C - 12.2%  Inpatient Diabetes Program Recommendations:     Agree with orders.  Spoke with patient about new diabetes diagnosis.  Discussed A1C results (12.2%) and explained what an A1C is and informed patient that her current A1C indicates an average glucose of 303 mg/dl over the past 2-3 months. Discussed basic pathophysiology of DM Type 2, basic home care, importance of checking CBGs and maintaining good CBG control to prevent long-term and short-term complications. Reviewed glucose and A1C goals and explained that patient will need to continue to monitor blood sugars at home.  Reviewed signs and symptoms of hyperglycemia and hypoglycemia along with treatment for both. Discussed impact of nutrition, exercise, stress, sickness, and medications on diabetes control. Ordered Living Well with diabetes booklet and encouraged patient and husband to read through entire book.   Demonstrated insulin pen use and discussed insulin injections, changing sites, etc. RN to allow pt to check CBGs and give own insulin and participate in all aspects of diabetes care.   Will f/u in am.  Thank you. Ailene Ards, RD, LDN, CDE Inpatient Diabetes Coordinator [phone]

## 2020-08-05 NOTE — Progress Notes (Signed)
Urology Inpatient Progress Report  Delirium [R41.0] Hyponatremia [E87.1] Acute renal failure, unspecified acute renal failure type (HCC) [N17.9] Multifocal pneumonia [J18.9] Hyperosmolar hyperglycemic state (HHS) (HCC) [E11.00, E11.65]        Intv/Subj: Patient with low grade fever overnight.  WBC has plateaued at 16.0.  Cr continues to trend down.  Pt more interactive.   Principal Problem:   Multifocal pneumonia Active Problems:   Hyperosmolar hyperglycemic state (HHS) (HCC)   Hyponatremia   Hypertension   Acute renal failure (HCC)   Hypokalemia   Severe protein-calorie malnutrition (HCC)   Class 1 obesity   Leukocytosis   Hypomagnesemia   Hyperphosphatemia   Abdominal distention   Lung nodules   Delirium  Current Facility-Administered Medications  Medication Dose Route Frequency Provider Last Rate Last Admin  . acetaminophen (TYLENOL) tablBobette Mog Oral Q6H PRN Ortiz, David Manuel, MD       Or  . acetaminophen (TYLENOL) suppository 650 mg  650 mg Rectal Q6H PRN Bobette Mo, MD      . albuterol (PROVENTIL) (2.5 MG/3ML) 0.083% nebulizer solution 2.5 mg  2.5 mg Nebulization Q4H PRN Bobette Mo, MD      . ceFAZolin (ANCEF) IVPB 2g/100 mL premix  2 g Intravenous Q12H Otho Bellows, [facility]   Stopped at 08/04/20 2144  . chlorhexidine (PERIDEX) 0.12 % solution 15 mL  15 mL Mouth Rinse BID Laural Benes, Clanford L, MD   15 mL at 08/04/20 2113  . Chlorhexidine Gluconate Cloth 2 % PADS 6 each  6 each Topical Daily Cleora Fleet, MD   6 each at 08/04/20 2028  . dextrose 50 % solution 0-50 mL  0-50 mL Intravenous PRN Fayrene Helper, PA-C      . hydrALAZINE (APRESOLINE) tablet 25 mg  25 mg Oral Q6H PRN Bobette Mo, MD      . insulin aspart (novoLOG) injection 0-15 Units  0-15 Units Subcutaneous Q4H Cleora Fleet, MD   3 Units at 08/04/20 2012  . insulin glargine (LANTUS) injection 10 Units  10 Units Subcutaneous BID Oretha Milch, MD   10 Units at  08/04/20 2114  . MEDLINE mouth rinse  15 mL Mouth Rinse q12n4p Johnson, Clanford L, MD   15 mL at 08/04/20 1619  . ondansetron (ZOFRAN) tablet 4 mg  4 mg Oral Q6H PRN Bobette Mo, MD       Or  . ondansetron [facility]) injection 4 mg  4 mg Intravenous Q6H PRN Bobette Mo, MD      . sodium bicarbonate 150 mEq in sterile water 1,000 mL infusion   Intravenous Continuous Delano Metz, MD 50 mL/hr at 08/05/20 0300 Infusion Verify at 08/05/20 0300  . sodium chloride 0.9 % bolus 1,588 mL  20 mL/kg Intravenous Once Fayrene Helper, PA-C      . sodium chloride flush (NS) 0.9 % injection 5 mL  5 mL Intracatheter Q8H Oley Balm, MD   5 mL at 08/05/20 0500     Objective: Vital: Vitals:   08/05/20 0300 08/05/20 0400 08/05/20 0500 08/05/20 0600  BP: (!) 153/63 (!) 163/71 (!) 142/56 (!) 136/45  Pulse: 91 92 91 95  Resp: 18 (!) 22 16 (!) 25  Temp:  (!) 97.5 F (36.4 C)    TempSrc:  Oral    SpO2: 96% 95% 94% 95%  Weight:      Height:       I/Os: I/O last 3 completed shifts: In: 2343.7 [I.V.:1957.4;  IV Piggyback:386.3] Out: 3150 [Urine:3020; Drains:130]  Physical Exam:  General: Patient is in no apparent distress Lungs: Normal respiratory effort, chest expands symmetrically. GI:  The abdomen is soft and nontender  GU: L PCN drain - dark yellow urine with debris        L perirenal drain - dark red output (had recently been flushed) Foley: clear yellow urine Ext: lower extremities symmetric  Lab Results: Recent Labs    08/03/20 0412 08/04/20 0745  WBC 30.3* 16.3*  HGB 10.5* 10.5*  HCT 28.3* 29.2*   Recent Labs    08/03/20 1319 08/04/20 0115 08/04/20 0745  NA 126* 128* 129*  K 2.8* 3.3* 3.2*  CL 88* 88* 87*  CO2 24 28 29   GLUCOSE 107* 114* 104*  BUN 99* 93* 87*  CREATININE 3.20* 2.90* 2.60*  CALCIUM 7.8* 8.0* 8.1*   No results for input(s): LABPT, INR in the last 72 hours. No results for input(s): LABURIN in the last 72 hours. Results for orders placed or  performed during the hospital encounter of 07/31/20  Culture, blood (single)     Status: Abnormal   Collection Time: 07/31/20  5:26 PM   Specimen: BLOOD  Result Value Ref Range Status   Specimen Description   Final    BLOOD LEFT ANTECUBITAL Performed at [facility], [address] [place], [place] [address]    Special Requests   Final    BOTTLES DRAWN AEROBIC AND ANAEROBIC Blood Culture adequate volume Performed at [facility], [address] [place], [place] [address]    Culture  Setup Time   Final    GRAM NEGATIVE RODS AEROBIC AND ANEROBIC BOTTLES Gram Stain Report Called to,Read Back By and Verified With: LARIMORE, A. 12/24 @ 0740 BEARD, S.  CRITICAL RESULT CALLED TO, READ BACK BY AND VERIFIED WITH: PHARMD  Lucita LoraERRY G. [phone] FCP Performed at [facility], [address] [place], [place] [address]    Culture ESCHERICHIA COLI (A)  Final   Report Status 08/03/2020 FINAL  Final   Organism ID, Bacteria ESCHERICHIA COLI  Final      Susceptibility   Escherichia coli - MIC*    AMPICILLIN <=2 SENSITIVE Sensitive     CEFAZOLIN <=4 SENSITIVE Sensitive     CEFEPIME <=0.12 SENSITIVE Sensitive     CEFTAZIDIME <=1 SENSITIVE Sensitive     CEFTRIAXONE <=0.25 SENSITIVE Sensitive     CIPROFLOXACIN <=0.25 SENSITIVE Sensitive     GENTAMICIN <=1 SENSITIVE Sensitive     IMIPENEM <=0.25 SENSITIVE Sensitive     TRIMETH/SULFA <=20 SENSITIVE Sensitive     AMPICILLIN/SULBACTAM <=2 SENSITIVE Sensitive     PIP/TAZO <=4 SENSITIVE Sensitive     * ESCHERICHIA COLI  Blood Culture ID Panel (Reflexed)     Status: Abnormal   Collection Time: 07/31/20  5:26 PM  Result Value Ref Range Status   Enterococcus faecalis NOT DETECTED NOT DETECTED Final   Enterococcus Faecium NOT DETECTED NOT DETECTED Final   Listeria monocytogenes NOT DETECTED NOT DETECTED Final   Staphylococcus species NOT DETECTED NOT DETECTED Final   Staphylococcus aureus (BCID) NOT DETECTED NOT DETECTED Final   Staphylococcus  epidermidis NOT DETECTED NOT DETECTED Final   Staphylococcus lugdunensis NOT DETECTED NOT DETECTED Final   Streptococcus species NOT DETECTED NOT DETECTED Final   Streptococcus agalactiae NOT DETECTED NOT DETECTED Final   Streptococcus pneumoniae NOT DETECTED NOT DETECTED Final   Streptococcus pyogenes NOT DETECTED NOT DETECTED Final   A.calcoaceticus-baumannii NOT DETECTED NOT DETECTED Final  Bacteroides fragilis NOT DETECTED NOT DETECTED Final   Enterobacterales DETECTED (A) NOT DETECTED Final    Comment: Enterobacterales represent a large order of gram negative bacteria, not a single organism. CRITICAL RESULT CALLED TO, READ BACK BY AND VERIFIED WITH: PHARMD  TERRY G. [id number] FCP    Enterobacter cloacae complex NOT DETECTED NOT DETECTED Final   Escherichia coli DETECTED (A) NOT DETECTED Final    Comment: CRITICAL RESULT CALLED TO, READ BACK BY AND VERIFIED WITH: PHARMD  TERRY G. [id number] 1921 FCP    Klebsiella aerogenes NOT DETECTED NOT DETECTED Final   Klebsiella oxytoca NOT DETECTED NOT DETECTED Final   Klebsiella pneumoniae NOT DETECTED NOT DETECTED Final   Proteus species NOT DETECTED NOT DETECTED Final   Salmonella species NOT DETECTED NOT DETECTED Final   Serratia marcescens NOT DETECTED NOT DETECTED Final   Haemophilus influenzae NOT DETECTED NOT DETECTED Final   Neisseria meningitidis NOT DETECTED NOT DETECTED Final   Pseudomonas aeruginosa NOT DETECTED NOT DETECTED Final   Stenotrophomonas maltophilia NOT DETECTED NOT DETECTED Final   Candida albicans NOT DETECTED NOT DETECTED Final   Candida auris NOT DETECTED NOT DETECTED Final   Candida glabrata NOT DETECTED NOT DETECTED Final   Candida krusei NOT DETECTED NOT DETECTED Final   Candida parapsilosis NOT DETECTED NOT DETECTED Final   Candida tropicalis NOT DETECTED NOT DETECTED Final   Cryptococcus neoformans/gattii NOT DETECTED NOT DETECTED Final   CTX-M ESBL NOT DETECTED NOT DETECTED Final   Carbapenem resistance  IMP NOT DETECTED NOT DETECTED Final   Carbapenem resistance KPC NOT DETECTED NOT DETECTED Final   Carbapenem resistance NDM NOT DETECTED NOT DETECTED Final   Carbapenem resist OXA 48 LIKE NOT DETECTED NOT DETECTED Final   Carbapenem resistance VIM NOT DETECTED NOT DETECTED Final    Comment: Performed at [facility], [address] [place], [place] [address]  Resp Panel by RT-PCR (Flu A&B, Covid) Nasopharyngeal Swab     Status: None   Collection Time: 07/31/20  5:39 PM   Specimen: Nasopharyngeal Swab; Nasopharyngeal(NP) swabs in vial transport medium  Result Value Ref Range Status   SARS Coronavirus 2 by RT PCR NEGATIVE NEGATIVE Final    Comment: (NOTE) SARS-CoV-2 target nucleic acids are NOT DETECTED.  The SARS-CoV-2 RNA is generally detectable in upper respiratory specimens during the acute phase of infection. The lowest concentration of SARS-CoV-2 viral copies this assay can detect is 138 copies/mL. A negative result does not preclude SARS-Cov-2 infection and should not be used as the sole basis for treatment or other patient management decisions. A negative result may occur with  improper specimen collection/handling, submission of specimen other than nasopharyngeal swab, presence of viral mutation(s) within the areas targeted by this assay, and inadequate number of viral copies(<138 copies/mL). A negative result must be combined with clinical observations, patient history, and epidemiological information. The expected result is Negative.  Fact Sheet for Patients:  BloggerCourse.com  Fact Sheet for Healthcare Providers:  SeriousBroker.it  This test is no t yet approved or cleared by the [place] FDA and  has been authorized for detection and/or diagnosis of SARS-CoV-2 by FDA under an Emergency Use Authorization (EUA). This EUA will remain  in effect (meaning this test can be used) for the duration of the COVID-19  declaration under Section 564(b)(1) of the Act, 21 U.S.C.section 360bbb-3(b)(1), unless the authorization is terminated  or revoked sooner.       Influenza A by PCR NEGATIVE NEGATIVE Final   Influenza  B by PCR NEGATIVE NEGATIVE Final    Comment: (NOTE) The Xpert Xpress SARS-CoV-2/FLU/RSV plus assay is intended as an aid in the diagnosis of influenza from Nasopharyngeal swab specimens and should not be used as a sole basis for treatment. Nasal washings and aspirates are unacceptable for Xpert Xpress SARS-CoV-2/FLU/RSV testing.  Fact Sheet for Patients: BloggerCourse.com  Fact Sheet for Healthcare Providers: SeriousBroker.it  This test is not yet approved or cleared by the [place] FDA and has been authorized for detection and/or diagnosis of SARS-CoV-2 by FDA under an Emergency Use Authorization (EUA). This EUA will remain in effect (meaning this test can be used) for the duration of the COVID-19 declaration under Section 564(b)(1) of the Act, 21 U.S.C. section 360bbb-3(b)(1), unless the authorization is terminated or revoked.  Performed at [facility], [address] [place], [place] [address]   Culture, blood (single)     Status: None (Preliminary result)   Collection Time: 07/31/20  8:23 PM   Specimen: BLOOD RIGHT WRIST  Result Value Ref Range Status   Specimen Description BLOOD RIGHT WRIST  Final   Special Requests   Final    BOTTLES DRAWN AEROBIC AND ANAEROBIC Blood Culture results may not be optimal due to an excessive volume of blood received in culture bottles   Culture   Final    NO GROWTH 4 DAYS Performed at [facility], [address], [place], [place] [address]    Report Status PENDING  Incomplete  Urine Culture     Status: Abnormal   Collection Time: 08/01/20 11:28 AM   Specimen: Urine, Clean Catch  Result Value Ref Range Status   Specimen Description   Final    URINE, CLEAN CATCH Performed at [facility], [address], [place], [place] [address]    Special Requests   Final    NONE Performed at [facility], [address], [place], [place] [address]    Culture (A)  Final    <10,000 COLONIES/mL INSIGNIFICANT GROWTH Performed at [facility], [address] [place], [place] [address]    Report Status 08/02/2020 FINAL  Final  MRSA PCR Screening     Status: None   Collection Time: 08/01/20  3:26 PM   Specimen: Nasopharyngeal  Result Value Ref Range Status   MRSA by PCR NEGATIVE NEGATIVE Final    Comment:        The GeneXpert MRSA Assay (FDA approved for NASAL specimens only), is one component of a comprehensive MRSA colonization surveillance program. It is not intended to diagnose MRSA infection nor to guide or monitor treatment for MRSA infections. Performed at [facility], [address], [place], [place] [address]   Aerobic/Anaerobic Culture (surgical/deep wound)     Status: None (Preliminary result)   Collection Time: 08/01/20  7:37 PM   Specimen: Urine, Catheterized  Result Value Ref Range Status   Specimen Description   Final    URINE, CATHETERIZED URINE FROM DRAIN Performed at [facility], [address], [place], [place] [address]    Special Requests   Final    NONE Performed at [facility], [address], [place], [place] [address]    Gram Stain   Final    ABUNDANT WBC PRESENT, PREDOMINANTLY PMN RARE GRAM NEGATIVE RODS RARE GRAM POSITIVE RODS Performed at [facility], [address] [place], [place] [address]    Culture   Final    FEW ESCHERICHIA COLI CULTURE REINCUBATED  FOR BETTER GROWTH NO ANAEROBES ISOLATED; CULTURE IN PROGRESS FOR 5 DAYS    Report Status PENDING  Incomplete   Organism ID, Bacteria ESCHERICHIA COLI  Final      Susceptibility   Escherichia coli - MIC*    AMPICILLIN 4 SENSITIVE Sensitive     CEFAZOLIN <=4 SENSITIVE Sensitive     CEFEPIME <=0.12 SENSITIVE  Sensitive     CEFTRIAXONE <=0.25 SENSITIVE Sensitive     CIPROFLOXACIN <=0.25 SENSITIVE Sensitive     GENTAMICIN <=1 SENSITIVE Sensitive     IMIPENEM <=0.25 SENSITIVE Sensitive     NITROFURANTOIN <=16 SENSITIVE Sensitive     TRIMETH/SULFA <=20 SENSITIVE Sensitive     AMPICILLIN/SULBACTAM <=2 SENSITIVE Sensitive     PIP/TAZO <=4 SENSITIVE Sensitive     * FEW ESCHERICHIA COLI  Aerobic/Anaerobic Culture (surgical/deep wound)     Status: None (Preliminary result)   Collection Time: 08/01/20  7:38 PM   Specimen: Abscess  Result Value Ref Range Status   Specimen Description   Final    ABSCESS Performed at [facility], [address], [place], [place] [address]    Special Requests   Final    NONE Performed at [facility], [address] [place], [place] [address]    Gram Stain   Final    ABUNDANT WBC PRESENT, PREDOMINANTLY PMN MODERATE GRAM NEGATIVE RODS Performed at [facility], 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 EXAM: PORTABLE CHEST 1 VIEW COMPARISON:  CT Chest, Abdomen, and Pelvis 08/01/2020 and earlier. FINDINGS: Portable AP semi upright view at 0541 hours. Lower lung volumes. Stable cardiac size and mediastinal contours. Increased left lung base opacity. Additional patchy and nodular bilateral pulmonary opacity better demonstrated by CT and not significantly changed. No pneumothorax. No pulmonary edema. No right pleural effusion. Visible bowel-gas within normal limits. No acute osseous abnormality identified. IMPRESSION: 1.  Lower lung volumes with increased left lung base opacity which could be related to combination of pleural effusion, atelectasis, or pneumonia. 2. Scattered patchy and nodular lung opacity elsewhere stable and suspicious for hematogenous disseminated infection (see CT 08/01/2020). Electronically Signed   By: Odessa Fleming M.D.   On: 08/04/2020 05:58    Assessment: 59 year old woman with severeleft emphysematous pyelonephritis,4 mm left UPJcalculus and clinicalsigns ofseveresepsis with admission labs WBC 44, hyponatremia,and glucose 706 now clinically improving.  1. Emphysematous pyelonephritis: -antibiotics have been narrowed according to urine/blood cultures (pan sensitive E coli) -recommend foley for maximum decompression of infected urine -Continue LEFT perirenal drain and LEFT PCN tube -greatly appreciate IR evaluation and treatment -would like to reimage left kidney at some point once creatinine stabilizes ideally with contrast   2. UPJ calculus with mild obstructive uropathy and AKI: -Creatinine continues to improve with Left PCN drain -will continue to trend Cr and plan for reimaging in future   Kasandra Knudsen, MD Urology 08/05/2020, 7:17 AM

## 2020-08-05 NOTE — Plan of Care (Signed)
  Problem: Coping: Goal: Level of anxiety will decrease Outcome: Progressing   Problem: Pain Managment: Goal: General experience of comfort will improve Outcome: Progressing   

## 2020-08-05 NOTE — Progress Notes (Signed)
Referring Physician(s): Dr. Arita Miss  Supervising Physician: Irish Lack  Patient Status:  [facility] - In-pt  Chief Complaint:  Abdominal pain, nausea, vomiting,. Found to be septic with left UPJ calculi, left sided hydronephrosis, AKI, left sided pyelonephrosis and abscess inferior to the left kidney and hyponatremia. IR placed a left sided nephrostomy tube and left perinephric abscess drain on 12.24.21 by Dr. Deanne Coffer  Subjective:  Patient lethargic but alert to verbal stimuli. Reporting an  improvement in her overall condition. Husband at bedside.  Allergies: Codeine  Medications: Prior to Admission medications   Medication Sig Start Date End Date Taking? Authorizing Provider  acetaminophen (TYLENOL) 500 MG tablet Take 1,000 mg by mouth every 6 (six) hours as needed.   Yes [provider]  Pseudoeph-Doxylamine-DM-APAP (NYQUIL PO) Take 1 tablet by mouth daily as needed (congestion).   Yes [provider]     Vital Signs: BP (!) 109/50   Pulse 95   Temp 99.2 F (37.3 C) (Oral)   Resp (!) 24   Ht  (1.575 m)   Wt 178 lb 12.7 oz (81.1 kg)   LMP  (LMP Unknown)   SpO2 94%   BMI 32.70 kg/m   Physical Exam Vitals and nursing note reviewed.  Constitutional:      Appearance: She is well-developed. She is ill-appearing.  HENT:     Head: Normocephalic and atraumatic.  Eyes:     Conjunctiva/sclera: Conjunctivae normal.  Pulmonary:     Effort: Pulmonary effort is normal.  Abdominal:     Comments: Positive left flank drain  To gravity bag. site is unremarkable with no erythema, edema, tenderness, bleeding or drainage noted at exit site. Suture and stat lock in place. Dressing is clean dry and intact. 10 ml of  sangious colored fluid noted in gravity bag. Drain is able to be flushed easily. Reversed flush the bag to confirm no clots present.    Genitourinary:    Comments: Positive left nephrostomy  Tube to gravity bag. site is unremarkable with no  erythema, edema, tenderness, bleeding or drainage noted at exit site. Suture and stat lock in place. Dressing is clean dry and intact. 20 ml of  yellow slightly ping tinged colored fluid noted in gravity bag.   Musculoskeletal:        General: Normal range of motion.     Cervical back: Normal range of motion.  Skin:    General: Skin is warm.  Neurological:     Mental Status: She is alert and oriented to person, place, and time.     Imaging: CT ABDOMEN PELVIS WO CONTRAST  Result Date: 08/01/2020 CLINICAL DATA:  Abnormal chest radiograph, lung nodule, generalized abdominal pain and distension, confusion, no medical care in years EXAM: CT CHEST, ABDOMEN AND PELVIS WITHOUT CONTRAST TECHNIQUE: Multidetector CT imaging of the chest, abdomen and pelvis was performed following the standard protocol without IV contrast. IV contrast not administered due to renal dysfunction. Sagittal and coronal MPR images reconstructed from axial data set. No oral contrast administered. 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 Minimal pericardial fluid. Heart otherwise unremarkable. Mediastinum/Nodes: Questionable thickening of esophagus versus artifact from underdistention. No thoracic adenopathy identified. Base of cervical region normal appearance. Lungs/Pleura: Multiple BILATERAL pulmonary nodules up to 12 mm diameter. A more confluent area of opacity is seen in the posterior segment of the RIGHT upper lobe, 4.0 x 3.0 cm. Some of  the nodules appear ill-defined and slightly shaggy a. These could represent metastatic foci or septic emboli. No definite cavitary lesions are seen. Dependent atelectasis LEFT lower lobe. No pulmonary infiltrate, pleural effusion, or pneumothorax. Musculoskeletal: Scattered endplate spur formation thoracic spine. No sclerotic or destructive osseous lesions. CT ABDOMEN PELVIS  FINDINGS Hepatobiliary: Gallbladder and liver normal appearance Pancreas: Atrophic, otherwise unremarkable Spleen: Normal appearance Adrenals/Urinary Tract: Adrenal glands normal appearance. Minimal collecting system dilatation RIGHT kidney without mass or calcification. RIGHT ureter decompressed. Abnormal appearance of LEFT kidney, which is diffusely enlarged and demonstrates significant perinephric edema. In addition, significant gas is seen in the perinephric space and questionably subcapsular, minimally in the collecting system, consistent with emphysematous pyelonephritis. Edema/fluid is seen extending along the lateral conal fascia and the anterior and posterior pararenal fascia. A gas and fluid collection is identified inferior to the LEFT kidney, 6.4 x 3.6 x 5.9 cm consistent with abscess. 4 mm calculus at LEFT ureteropelvic junction with mild LEFT hydronephrosis. Small amount of air within bladder though this could be related to prior catheterization. Stomach/Bowel: Stomach and bowel loops normal appearance. Edema from the LEFT renal process extends to the lateral conal fascia and adjacent to the descending colon but the descending colon does not appear to be thickened or involved. Vascular/Lymphatic: Minimal atherosclerotic calcification aorta. Aorta normal caliber. No definite adenopathy. Reproductive: Unremarkable uterus and adnexa Other: No free air or free fluid. LEFT retroperitoneal edema the posterior pararenal space extending into upper pelvis. No hernia. Musculoskeletal: No acute osseous findings. IMPRESSION: Multiple BILATERAL pulmonary nodules up to 12 mm diameter, some of which appear ill-defined and slightly shaggy. These could represent metastatic foci or septic emboli. Correlation with cardiology recommended to exclude source of septic embolism. 4 mm LEFT UPJ calculus with mild LEFT hydronephrosis and hydroureter. 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 Recommend annual imaging followup by CTA or MRA. This recommendation follows 2010 ACCF/AHA/AATS/ACR/ASA/SCA/SCAI/SIR/STS/SVM Guidelines for the Diagnosis and Management of Patients with Thoracic Aortic Disease. Circulation. 2010; 121: O962-X528: [id number]. Aortic aneurysm NOS (ICD10-I71.9) Aortic Atherosclerosis (ICD10-I70.0) Findings discussed with Dr. Henreitta LeberBridges at time of exam on 08/01/2020. Electronically Signed   By: Ulyses SouthwardMark  Boles M.D.   On: 08/01/2020 11:34   CT HEAD WO CONTRAST  Result Date: 08/01/2020 CLINICAL DATA:  Disorientation, abnormal chest radiograph with pulmonary nodules, hyponatremia EXAM: CT HEAD WITHOUT CONTRAST TECHNIQUE: Contiguous axial images were obtained from the base of the skull through the vertex without intravenous contrast. Sagittal and coronal MPR images reconstructed from axial data set. COMPARISON:  None FINDINGS: Brain: Motion artifacts on initial imaging, for which repeat imaging was performed. Normal ventricular morphology. No midline shift or mass effect. Normal appearance of brain parenchyma. No intracranial hemorrhage, mass lesion, or evidence of acute infarction. Vascular: No hyperdense vessels Skull: Intact Sinuses/Orbits: Clear Other: N/A IMPRESSION: No acute intracranial abnormalities. Electronically Signed   By: Ulyses SouthwardMark  Boles M.D.   On: 08/01/2020 11:36   CT CHEST WO CONTRAST  Result Date: 08/01/2020 CLINICAL DATA:  Abnormal chest radiograph, lung nodule, generalized abdominal pain and distension, confusion, no medical care in years EXAM: CT CHEST, ABDOMEN AND PELVIS WITHOUT CONTRAST TECHNIQUE: Multidetector CT imaging of the chest, abdomen and pelvis was performed following the standard protocol without IV contrast. IV contrast not administered due to renal dysfunction. Sagittal and coronal MPR images reconstructed from axial data  set.  No oral contrast administered. 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 Minimal pericardial fluid. Heart otherwise unremarkable. Mediastinum/Nodes: Questionable thickening of esophagus versus artifact from underdistention. No thoracic adenopathy identified. Base of cervical region normal appearance. Lungs/Pleura: Multiple BILATERAL pulmonary nodules up to 12 mm diameter. A more confluent area of opacity is seen in the posterior segment of the RIGHT upper lobe, 4.0 x 3.0 cm. Some of the nodules appear ill-defined and slightly shaggy a. These could represent metastatic foci or septic emboli. No definite cavitary lesions are seen. Dependent atelectasis LEFT lower lobe. No pulmonary infiltrate, pleural effusion, or pneumothorax. Musculoskeletal: Scattered endplate spur formation thoracic spine. No sclerotic or destructive osseous lesions. CT ABDOMEN PELVIS FINDINGS Hepatobiliary: Gallbladder and liver normal appearance Pancreas: Atrophic, otherwise unremarkable Spleen: Normal appearance Adrenals/Urinary Tract: Adrenal glands normal appearance. Minimal collecting system dilatation RIGHT kidney without mass or calcification. RIGHT ureter decompressed. Abnormal appearance of LEFT kidney, which is diffusely enlarged and demonstrates significant perinephric edema. In addition, significant gas is seen in the perinephric space and questionably subcapsular, minimally in the collecting system, consistent with emphysematous pyelonephritis. Edema/fluid is seen extending along the lateral conal fascia and the anterior and posterior pararenal fascia. A gas and fluid collection is identified inferior to the LEFT kidney, 6.4 x 3.6 x 5.9 cm consistent with abscess. 4 mm calculus at LEFT ureteropelvic junction with mild LEFT hydronephrosis. Small amount of air within bladder though this could be related to prior  catheterization. Stomach/Bowel: Stomach and bowel loops normal appearance. Edema from the LEFT renal process extends to the lateral conal fascia and adjacent to the descending colon but the descending colon does not appear to be thickened or involved. Vascular/Lymphatic: Minimal atherosclerotic calcification aorta. Aorta normal caliber. No definite adenopathy. Reproductive: Unremarkable uterus and adnexa Other: No free air or free fluid. LEFT retroperitoneal edema the posterior pararenal space extending into upper pelvis. No hernia. Musculoskeletal: No acute osseous findings. IMPRESSION: Multiple BILATERAL pulmonary nodules up to 12 mm diameter, some of which appear ill-defined and slightly shaggy. These could represent metastatic foci or septic emboli. Correlation with cardiology recommended to exclude source of septic embolism. 4 mm LEFT UPJ calculus with mild LEFT hydronephrosis and hydroureter. 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 Recommend annual imaging followup by CTA or MRA. This recommendation follows 2010 ACCF/AHA/AATS/ACR/ASA/SCA/SCAI/SIR/STS/SVM Guidelines for the Diagnosis and Management of Patients with Thoracic Aortic Disease. Circulation. 2010; 121: Z366-Y403. Aortic aneurysm NOS (ICD10-I71.9) Aortic Atherosclerosis (ICD10-I70.0) Findings discussed with Dr. Henreitta Leber at time of exam on 08/01/2020. Electronically Signed   By: Ulyses Southward M.D.   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 EXAM: PORTABLE CHEST 1 VIEW COMPARISON:  CT Chest, Abdomen, and Pelvis 08/01/2020 and earlier. FINDINGS: Portable [facility] semi upright view at 0541  hours. Lower lung volumes. Stable cardiac size and mediastinal contours. Increased left lung base opacity. Additional patchy and nodular bilateral pulmonary opacity better demonstrated by CT and not significantly changed. No pneumothorax. No pulmonary edema. No right pleural effusion. Visible bowel-gas within normal limits. No acute osseous abnormality identified. IMPRESSION: 1. Lower lung volumes with increased left lung base opacity which could  be related to combination of pleural effusion, atelectasis, or pneumonia. 2. Scattered patchy and nodular lung opacity elsewhere stable and suspicious for hematogenous disseminated infection (see CT 08/01/2020). Electronically Signed   By: Odessa Fleming M.D.   On: 08/04/2020 05:58   CT IMAGE GUIDED DRAINAGE BY PERCUTANEOUS CATHETER  Result Date: 08/02/2020 INDICATION: Left emphysematous pyelonephritis with obstructing calculus and retroperitoneal abscess EXAM: CT-GUIDED LEFT NEPHROSTOMY PLACEMENT CT-GUIDED PELVIC ABSCESS DRAIN CATHETER PLACEMENT MEDICATIONS: The patient is currently admitted to the hospital and receiving intravenous antibiotics. The antibiotics were administered within an appropriate time frame prior to the initiation of the procedure. ANESTHESIA/SEDATION: Intravenous Fentanyl and Versed 1mg  were administered as conscious sedation during continuous monitoring of the patient's level of consciousness and physiological / cardiorespiratory status by the radiology RN, with a total moderate sedation time of 42 minutes. COMPLICATIONS: None immediate. PROCEDURE: Informed written consent was obtained from the patient after a thorough discussion of the procedural risks, benefits and alternatives. All questions were addressed. Maximal Sterile Barrier Technique was utilized including caps, mask, sterile gowns, sterile gloves, sterile drape, hand hygiene and skin antiseptic. A timeout was performed prior to the initiation of the procedure. Patient placed in right  anterior oblique position and a limited axial scanning through the abdomen pelvis was performed. Appropriate skin entry sites were identified and marked, then prepped with chlorhexidine, draped in usual sterile fashion, infiltrated locally with 1% lidocaine. Under CT fluoroscopic guidance, a 21 gauge trocar needle advanced into a posterior lower pole calyx of the left renal collecting system. 018 guidewire advanced centrally without resistance, confirmed on CT. The transitional dilator was utilized to allow parallel placement of a 038 stiff Amplatz wire. Position confirmed on CT. Tract was dilated to facilitate placement 10 French pigtail drain catheter, formed centrally within the left renal collecting system. 5 mL of purulent urine were aspirated, sent for Gram stain and culture. In similar fashion, the left retroperitoneal pelvic gas and fluid collection anterior to the psoas was localized. 18 gauge trocar needle was advanced to the collection. Purulent material could be aspirated. A new sterile Amplatz wire advanced easily into the collection, position confirmed on CT. Tract dilated to facilitate placement 12 French pigtail drain catheter, formed centrally within the collection. 10 mL of purulent material were aspirated, sent for Gram stain and culture. Both catheters were secured externally with 0 Prolene sutures and StatLock devices and placed to gravity drain bags. The patient tolerated the procedure well. IMPRESSION: 1. Technically successful left nephrostomy catheter placement with CT guidance. 2. Technically successful left pelvic abscess drain catheter placement with CT guidance. 3. Aspirates from both sites were sent for Gram stain and culture. Electronically Signed   By: M.D.   On: 08/02/2020 08:25   CT IMAGE GUIDED DRAINAGE BY PERCUTANEOUS CATHETER  Result Date: 08/02/2020 INDICATION: Left emphysematous pyelonephritis with obstructing calculus and retroperitoneal abscess EXAM: CT-GUIDED  LEFT NEPHROSTOMY PLACEMENT CT-GUIDED PELVIC ABSCESS DRAIN CATHETER PLACEMENT MEDICATIONS: The patient is currently admitted to the hospital and receiving intravenous antibiotics. The antibiotics were administered within an appropriate time frame prior to the initiation of the procedure. ANESTHESIA/SEDATION: Intravenous Fentanyl 08/04/2020 and Versed 1mg  were administered as conscious sedation during continuous monitoring of the patient's level of consciousness and physiological / cardiorespiratory status by the radiology RN, with a total moderate sedation time of 42 minutes. COMPLICATIONS: None immediate. PROCEDURE: Informed written consent was obtained from the patient after a thorough discussion of the procedural risks, benefits and alternatives. All questions were  addressed. Maximal Sterile Barrier Technique was utilized including caps, mask, sterile gowns, sterile gloves, sterile drape, hand hygiene and skin antiseptic. A timeout was performed prior to the initiation of the procedure. Patient placed in right anterior oblique position and a limited axial scanning through the abdomen pelvis was performed. Appropriate skin entry sites were identified and marked, then prepped with chlorhexidine, draped in usual sterile fashion, infiltrated locally with 1% lidocaine. Under CT fluoroscopic guidance, a 21 gauge trocar needle advanced into a posterior lower pole calyx of the left renal collecting system. 018 guidewire advanced centrally without resistance, confirmed on CT. The transitional dilator was utilized to allow parallel placement of a 038 stiff Amplatz wire. Position confirmed on CT. Tract was dilated to facilitate placement 10 French pigtail drain catheter, formed centrally within the left renal collecting system. 5 mL of purulent urine were aspirated, sent for Gram stain and culture. In similar fashion, the left retroperitoneal pelvic gas and fluid collection anterior to the psoas was localized. 18 gauge trocar  needle was advanced to the collection. Purulent material could be aspirated. A new sterile Amplatz wire advanced easily into the collection, position confirmed on CT. Tract dilated to facilitate placement 12 French pigtail drain catheter, formed centrally within the collection. 10 mL of purulent material were aspirated, sent for Gram stain and culture. Both catheters were secured externally with 0 Prolene sutures and StatLock devices and placed to gravity drain bags. The patient tolerated the procedure well. IMPRESSION: 1. Technically successful left nephrostomy catheter placement with CT guidance. 2. Technically successful left pelvic abscess drain catheter placement with CT guidance. 3. Aspirates from both sites were sent for Gram stain and culture. Electronically Signed   By: Corlis Leak M.D.   On: 08/02/2020 08:25    Labs:  CBC: Recent Labs    08/02/20 0400 08/03/20 0412 08/04/20 0745 08/05/20 0750  WBC 47.6* 30.3* 16.3* 16.0*  HGB 10.4* 10.5* 10.5* 10.4*  HCT 27.9* 28.3* 29.2* 30.5*  PLT 237 273 308 315    COAGS: No results for input(s): INR, APTT in the last 8760 hours.  BMP: Recent Labs    08/03/20 1319 08/04/20 0115 08/04/20 0745 08/05/20 0750  NA 126* 128* 129* 133*  K 2.8* 3.3* 3.2* 3.1*  CL 88* 88* 87* 89*  CO2 24 28 29 30   GLUCOSE 107* 114* 104* 146*  BUN 99* 93* 87* 74*  CALCIUM 7.8* 8.0* 8.1* 8.1*  CREATININE 3.20* 2.90* 2.60* 1.84*  GFRNONAA 16* 18* 21* 31*    LIVER FUNCTION TESTS: Recent Labs    07/31/20 1913 08/01/20 0641  BILITOT 1.3* 0.9  AST 14* 19  ALT 16 19  ALKPHOS 147* 142*  PROT 5.4* 5.4*  ALBUMIN 2.0* 2.1*    Assessment and Plan:  59 y.o female inpatient. History of HTN, obesity . Presented to the ED at [facility] with worsening nausea, vomiting, diarrhea, abdominal pain and AMS. Patient was found to be septic with left UPJ calculi, left sided hydronephrosis, AKI, left sided pyelonephrosis and abscess inferior to the left kidney and hyponatremia.  IR placed a left sided nephrostomy tube and left perinephric abscess drain on 12.24.21. Per EPIC output is:  Left nephrostomy tube 100 ml, 175 ml, 50 ml.   Perinephric abscess drain 12.26.21, 200 ml, 25 ml.   WBC is 16.0, BUN 74, Cr 1.84, Potassium 3.1. Sodium 133.  Last recorded temp  99.2  Cultures from abscess show abundant WBC predominately PMN. Perinephric drain with 10 ml of sanguinous colored fluid  noted in gravity bag. Left nephrostomy with  20 ml yellow slightly pink tinged fluid noted in gravity bag.   Recommend team continue with flushing perinephric abscess drain TID, output recording q shift and dressing changes as needed. Would consider additional imaging when output is less than 10 ml for 24 hours not including flush material.   Continue current treatment plans as per critical care and urology.   Electronically Signed: Alene Mires, NP 08/05/2020, 10:52 AM   I spent a total of 15 Minutes at the patient's bedside AND on the patient's hospital floor or unit, greater than 50% of which was counseling/coordinating care for left nephrostomy tube placement and left perinephric abscess drain.

## 2020-08-05 NOTE — Progress Notes (Addendum)
NAME:  Tammie [name], MRN:  [id number], DOB:  April 04, 1961, LOS: 5 ADMISSION DATE:  07/31/2020, CONSULTATION DATE:  12/24REFERRING MD:  Laural Benes, CHIEF COMPLAINT:  Sepsis/hyponatremia/pyelonephritis w/ abscess   Brief History:  59 year old female admitted 12/24 with chief complaint of abdominal pain.  Found to have acute left pyelonephrosis with abscess, severe sepsis, acute kidney injury, hyper osmolar nonketotic hyperglycemia, severe hyponatremia  -CT imaging showed multiple pulmonary nodules, imaging of abdomen pelvis With left hydronephrosis and evidence of perinephritic abscess Was  transferred to [facility] for placement of left nephrostomy tube and left perirenal drain Critical care asked to admit given severe hyponatremia and concern for clinical decompensation  History of Present Illness:  59 year old female patient who presented to [facility] emergency room initially on 12/24 in the early a.m. hours with chief complaint of abdominal discomfort.  This had been ongoing over a 3-day history, and was associated with decreased oral intake, nausea, and feeling lightheaded.  She noted that the nausea had been going on for about 2 weeks, but had had multiple episodes of emesis and diarrhea for the 3 days prior to presentation.  She denied melena, or hematochezia.  She had poor poor p.o. intake.  Had been reporting significant thirst.  In the ER she was found to be normotensive, afebrile.  Vital signs were stable.  She did have significant leukocytosis with white blood cell count of 44, slightly elevated lactic acid, and chest x-ray showing multifocal airspace disease concerning for possible infection' her initial blood glucose was 706.  Her corrected sodium for this was roughly 116.  She was admitted to the internal medicine service with working diagnosis of multifocal pneumonia, hyperosmolar hyperglycemic state, acute kidney injury, and hyponatremia.  She was placed on broad-spectrum antibiotics.  She was  found to have a significantly distended abdomen because of this CT abdomen and pelvis was ordered.  General surgery were consulted.  Because of diffuse airspace disease a CT chest was obtained, primary and consulting team a little concerned about possible malignancy. The CT abdomen pelvis was resulted this showed multiple and bilateral pulmonary nodules which were ill-defined.  There was no acute pulmonary emboli.  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  In regards to her CT chest as noted she had bilateral multiple pulmonary nodules all of which were ill-defined.  CT of head was without intracranial hemorrhage, mass lesion or evidence of infarct.  Nephrology was consulted because of severe hyponatremia and acute kidney injury.  Because her hyponatremia was felt to be symptomatic and severe patient was started on 3% saline at 50 mL an hour, with initial goal sodium of 108.  Because of her severe hyponatremia, and concern for clinical deterioration she was asked to transfer to [facility] intensive care under the critical care service  Past Medical History:   Hypertension, class I obesity  Significant Hospital Events:  12/24 admitted with chief complaint of abdominal pain.  Diagnostic imaging from CT abdomen pelvis showing left hydronephrosis, pyelonephritis, and perinephritic abscess.  Blood cultures preliminarily showing gram-negative rods, CT chest with multiple pulmonary nodules.Presenting blood glucose 706, presenting sodium 1 less than 102.  Beta hydroxybutyric acid was negative.  Was given IV hydration.  Started  on empiric antibiotics.  Transferred to [facility]  following urology consultation recommending left nephrostomy tube and perinephritic drain.  Critical care asked to admit 12/25 Sodium improved from 10 2-1 1 4   , 3% saline stopped at 3 AM  Consults:  Urology Nephrology General surgery at any [facility]  Procedures:  12/24 IR >> CT guided perc nephrostomy ,CT guided L pelvic abscess drain placement   Significant Diagnostic Tests:  CT chest abdomen and pelvis 12/24: Multiple bilateral pulmonary nodules up to 12 mm in diameter, they are ill-defined and shaggy in appearance 4 mm left UPJ calculus with mild left hydronephrosis and hydroureter the left kidney is emphysematous with a 6.4 x 3.6 x 5.9 diameter abscess CT head: Negative  Micro Data:  Urine culture 12/23 Blood culture x2 was 12/23: E coli  Abscess cx 12/24 >> GNR >> Respiratory panel by PCR 12/23: Negative for Covid and influenza.  Antimicrobials:  Azithromycin and ceftriaxone 12/23 for 1 dose Zosyn 12/24>> 12/25 12/25 ceftx >> 12/27 Ancef 12/28 >   Interim History / Subjective:  No acute events.  Objective   Blood pressure (!) 136/45, pulse 95, temperature (!) 97.5 F (36.4 C), temperature source Oral, resp. rate (!) 25, height 5\' 2"  (1.575 m), weight 81.1 kg, SpO2 95 %.        Intake/Output Summary (Last 24 hours) at 08/05/2020 0708 Last data filed at 08/05/2020 0600 Gross per 24 hour  Intake 2343.72 ml  Output 2150 ml  Net 193.72 ml   Filed Weights   07/31/20 1702 08/01/20 0000  Weight: 79.4 kg 81.1 kg    Examination:  General: Adult female, resting in bed, in NAD. Neuro: More awake and alert today, follows commands. HEENT: [place]/AT. Sclerae anicteric. Cardiovascular: RRR, no M/R/G.  Lungs: Respirations even and unlabored.  CTA bilaterally, No W/R/R. Abdomen: PCN and perirenal drains intact, no drainage.  BS x 4, soft, NT/ND.  Musculoskeletal: No gross deformities, no edema.  Skin: Intact, warm, no rashes.  Assessment & Plan:   Severe sepsis with E. coli  bacteremia in the setting of left emphysematous pyelonephritis/ with perinephritic abscess, s/p perc nephrostomy and perirenal abscess drainage 12/24 Plan Continue ancef Continue foley Hold home antihypertensives High risk of mortality with surgery, urology following  Hyperosmolar nonketotic hyperglycemia Plan Continue Lantus 10 units every 12hrs for today, expect insulin requirements to decrease as the infection comes under control  Acute kidney injury - gradually improving.  Underlying renal function unknown.  Has not had medical care for about 30 years Likely at least somewhat exacerbated by left pyelonephritis with abscess Plan Continuing volume resuscitation Continue foley  Severe hyponatremia - improved after 3% NS which has since been d/c'd and was switched to isotonic bicarb Plan D/c bicarb gtt Start LR @ 75 for now  Hypokalemia - exacerbated by transcellular shifts with bicarb infusion Plan 6 runs K now D/c bicarb gtt as above Follow BMP  Multiple pulmonary nodules. Differential includes metastatic malignancy especially if primary renal malignancy in play Doubt septic emboli from E. coli Plan May need biopsy eventually, once acute issues resolve Repeat chest x-ray, may need repeat CT at some point   Best practice (evaluated daily)  Diet: Regular Pain/Anxiety/Delirium protocol (if indicated): Not indicated VAP protocol (if indicated): Not indicated DVT prophylaxis: Subcutaneous heparin GI prophylaxis: PPI Glucose control: SSI + lantus Mobility: Bedrest Disposition: ICU Code Status: full   CC time: 30 min.   1/25, Rutherford Guys [place] [facility] For  pager details, please see AMION 08/05/2020, 7:08 AM

## 2020-08-05 NOTE — Progress Notes (Signed)
Patient ID: Tammie [name], female   DOB: Apr 20, 1961, 59 y.o.   MRN: [id number] [facility] Progress Note   Assessment/ Plan:   1. AKI - unknown creat baseline. Due to combination of obstructive and ATN related to UPJ stone, urosepsis/ pyelonephritis. With L PCN and drain, IV abx and IVF's creat has improved from 3.9 >> 1.8 today. Very good UOP. Does not need renal f/u.  Will sign off.  2.  Hyponatremia: sp 3% saline, resolved. Na 133 today.  3.  Sepsis with left emphysematous pyelonephritis/UPJ stone: Status post L PCN and L renal abscess drain done 12/24, and getting IV abx w/ ceftriaxone with improving leukocytosis. Urology following.  4.  Hypokalemia: K 3.1, replete prn 5.  Anion gap metabolic acidosis: Secondary to acute kidney injury/lactic acidosis of sepsis-ongoing treatment of underlying sepsis with antibiotic therapy/percutaneous drainage and on isotonic sodium bicarbonate.  Subjective:   No c/o. Still looks tired but dressed and glasses on today.  Creat down 1.8 and Na 133 today    Objective:   BP (!) 109/50   Pulse 95   Temp 99.2 F (37.3 C) (Oral)   Resp (!) 24   Ht 5\' 2"  (1.575 m)   Wt 81.1 kg   LMP  (LMP Unknown)   SpO2 94%   BMI 32.70 kg/m   Intake/Output Summary (Last 24 hours) at 08/05/2020 1023 Last data filed at 08/05/2020 0600 Gross per 24 hour  Intake 2343.72 ml  Output 2150 ml  Net 193.72 ml   Weight change:   Physical Exam: Gen: very tired, responds to simple commands CVS: Pulse regular rhythm, normal rate, S1 and S2 normal Resp: Clear to auscultation bilaterally, no distinct rales or rhonchi Abd: Soft, obese, nontender, bowel sounds normal.  Percutaneous drains x 2 on left Ext: Trace lower extremity edema  Imaging: DG Chest Port 1 View  Result Date: 08/04/2020 CLINICAL DATA:  59 year old female with respiratory failure, sepsis, emphysematous left renal pyelonephritis status post CT-guided nephrostomy, abscess drain. EXAM: PORTABLE  CHEST 1 VIEW COMPARISON:  CT Chest, Abdomen, and Pelvis 08/01/2020 and earlier. FINDINGS: Portable AP semi upright view at 0541 hours. Lower lung volumes. Stable cardiac size and mediastinal contours. Increased left lung base opacity. Additional patchy and nodular bilateral pulmonary opacity better demonstrated by CT and not significantly changed. No pneumothorax. No pulmonary edema. No right pleural effusion. Visible bowel-gas within normal limits. No acute osseous abnormality identified. IMPRESSION: 1. Lower lung volumes with increased left lung base opacity which could be related to combination of pleural effusion, atelectasis, or pneumonia. 2. Scattered patchy and nodular lung opacity elsewhere stable and suspicious for hematogenous disseminated infection (see CT 08/01/2020). Electronically Signed   By: 08/03/2020 M.D.   On: 08/04/2020 05:58    Labs: BMET Recent Labs  Lab 08/01/20 0217 08/01/20 0641 08/01/20 1033 08/01/20 1136 08/02/20 0300 08/02/20 0740 08/02/20 1600 08/02/20 2357 08/03/20 0445 08/03/20 1319 08/04/20 0115 08/04/20 0745 08/05/20 0750  NA 105*   < > 103*   < > 112*   < > 118* 121* 124* 126* 128* 129* 133*  K 2.9*   < > 3.6  --  3.4*  --   --   --  2.7* 2.8* 3.3* 3.2* 3.1*  CL 74*   < > 75*  --  83*  --   --   --  87* 88* 88* 87* 89*  CO2 12*   < > 13*  --  14*  --   --   --  20* 24 28 29 30   GLUCOSE 227*   < > 151*  --  153*  --   --   --  92 107* 114* 104* 146*  BUN 108*   < > 108*  --  113*  --   --   --  103* 99* 93* 87* 74*  CREATININE 4.13*   < > 3.79*  --  3.66*  --   --   --  3.26* 3.20* 2.90* 2.60* 1.84*  CALCIUM 7.6*   < > 7.8*  --  7.9*  --   --   --  7.7* 7.8* 8.0* 8.1* 8.1*  PHOS 6.2*  --   --   --   --   --   --   --  6.7*  --   --   --  4.1   < > = values in this interval not displayed.   CBC Recent Labs  Lab 07/31/20 1758 08/01/20 0641 08/02/20 0400 08/03/20 0412 08/04/20 0745 08/05/20 0750  WBC 44.0* 49.1* 47.6* 30.3* 16.3* 16.0*  NEUTROABS  40.1* 46.2* 43.5* 27.3*  --   --   HGB 12.6 11.4* 10.4* 10.5* 10.5* 10.4*  HCT 34.3* 30.9* 27.9* 28.3* 29.2* 30.5*  MCV 83.1 82.2 83.3 83.7 86.9 89.4  PLT 225 206 237 273 308 315   Medications:    . chlorhexidine  15 mL Mouth Rinse BID  . Chlorhexidine Gluconate Cloth  6 each Topical Daily  . insulin aspart  0-15 Units Subcutaneous Q4H  . insulin glargine  10 Units Subcutaneous BID  . mouth rinse  15 mL Mouth Rinse q12n4p  . sodium chloride flush  5 mL Intracatheter Q8H

## 2020-08-06 LAB — CBC
HCT: 29.6 % — ABNORMAL LOW (ref 36.0–46.0)
Hemoglobin: 9.6 g/dL — ABNORMAL LOW (ref 12.0–15.0)
MCH: 30.1 pg (ref 26.0–34.0)
MCHC: 32.4 g/dL (ref 30.0–36.0)
MCV: 92.8 fL (ref 80.0–100.0)
Platelets: 308 10*3/uL (ref 150–400)
RBC: 3.19 MIL/uL — ABNORMAL LOW (ref 3.87–5.11)
RDW: 15.6 % — ABNORMAL HIGH (ref 11.5–15.5)
WBC: 15.5 10*3/uL — ABNORMAL HIGH (ref 4.0–10.5)
nRBC: 0 % (ref 0.0–0.2)

## 2020-08-06 LAB — BASIC METABOLIC PANEL
Anion gap: 12 (ref 5–15)
BUN: 56 mg/dL — ABNORMAL HIGH (ref 6–20)
CO2: 29 mmol/L (ref 22–32)
Calcium: 8.1 mg/dL — ABNORMAL LOW (ref 8.9–10.3)
Chloride: 94 mmol/L — ABNORMAL LOW (ref 98–111)
Creatinine, Ser: 1.61 mg/dL — ABNORMAL HIGH (ref 0.44–1.00)
GFR, Estimated: 37 mL/min — ABNORMAL LOW (ref >60)
Glucose, Bld: 87 mg/dL (ref 70–99)
Potassium: 3.8 mmol/L (ref 3.5–5.1)
Sodium: 135 mmol/L (ref 135–145)

## 2020-08-06 LAB — GLUCOSE, CAPILLARY
Glucose-Capillary: 81 mg/dL (ref 70–99)
Glucose-Capillary: 69 mg/dL — ABNORMAL LOW (ref 70–99)
Glucose-Capillary: 98 mg/dL (ref 70–99)
Glucose-Capillary: 122 mg/dL — ABNORMAL HIGH (ref 70–99)
Glucose-Capillary: 102 mg/dL — ABNORMAL HIGH (ref 70–99)
Glucose-Capillary: 153 mg/dL — ABNORMAL HIGH (ref 70–99)

## 2020-08-06 LAB — PHOSPHORUS: Phosphorus: 3.4 mg/dL (ref 2.5–4.6)

## 2020-08-06 LAB — MAGNESIUM: Magnesium: 1.6 mg/dL — ABNORMAL LOW (ref 1.7–2.4)

## 2020-08-06 NOTE — Progress Notes (Signed)
Inpatient Diabetes Program Recommendations  AACE/ADA: New Consensus Statement on Inpatient Glycemic Control (2015)  Target Ranges:  Prepandial:   less than 140 mg/dL      Peak postprandial:   less than 180 mg/dL (1-2 hours)      Critically ill patients:  140 - 180 mg/dL   Lab Results  Component Value Date   GLUCAP 98 08/06/2020   HGBA1C 12.2 (H) 08/01/2020    Review of Glycemic Control  Diabetes history: New-onset Outpatient Diabetes medications: None Current orders for Inpatient glycemic control: Lantus 10 units bid, Novolog 0-20 units tidwc and hs   Inpatient Diabetes Program Recommendations:     Decrease Lantus to 8 units bid  Will f/u this afternoon regarding insulin pen administration and review of diabetes survival skills with pt and husband.   RN allowing pt to give own insulin and stick finger for CBG checks.  Continue to follow.  Thank you. Ailene Ards, RD, LDN, CDE Inpatient Diabetes Coordinator [phone]

## 2020-08-06 NOTE — Progress Notes (Addendum)
NAME:  Tammie [name], MRN:  [id number], DOB:  1960-10-08, LOS: 6 ADMISSION DATE:  07/31/2020, CONSULTATION DATE:  12/24REFERRING MD:  Laural Benes, CHIEF COMPLAINT:  Sepsis/hyponatremia/pyelonephritis w/ abscess   Brief History:  59 year old female admitted 12/24 with chief complaint of abdominal pain.  Found to have acute left pyelonephrosis with abscess, severe sepsis, acute kidney injury, hyper osmolar nonketotic hyperglycemia, severe hyponatremia  -CT imaging showed multiple pulmonary nodules, imaging of abdomen pelvis With left hydronephrosis and evidence of perinephritic abscess Was  transferred to [facility] for placement of left nephrostomy tube and left perirenal drain Critical care asked to admit given severe hyponatremia and concern for clinical decompensation Past Medical History:   Hypertension, class I obesity  Significant Hospital Events:  12/24 admitted with chief complaint of abdominal pain.  Diagnostic imaging from CT abdomen pelvis showing left hydronephrosis, pyelonephritis, and perinephritic abscess.  Blood cultures preliminarily showing gram-negative rods, CT chest with multiple pulmonary nodules.Presenting blood glucose 706, presenting sodium 1 less than 102.  Beta hydroxybutyric acid was negative.  Was given IV hydration.  Started on empiric antibiotics.  Transferred to [facility] following urology consultation recommending left nephrostomy tube and perinephritic drain.  Critical care asked to admit: Left PCN in left perirenal abscess drain both placed on 12/24 12/25 Sodium improved from 10 2-1 1 4   , 3% saline stopped at 3 AM, placed on bicarbonate infusion. Off pressors 12/26-12/29: 12/27: Switched to Ancef given sensitivities bicarb continued. 12/28: Bicarbonate changed to LR 12/29: Doing better. Ready for transfer. Consults:  Urology Nephrology General surgery at any [facility]  Procedures:  12/24 IR >> CT guided perc nephrostomy ,CT guided L pelvic abscess drain placement    Significant Diagnostic Tests:  CT chest abdomen and pelvis 12/24: Multiple bilateral pulmonary nodules up to 12 mm in diameter, they are ill-defined and shaggy in appearance 4 mm left UPJ calculus with mild left hydronephrosis and hydroureter the left kidney is emphysematous with a 6.4 x 3.6 x 5.9 diameter abscess CT head: Negative  Micro Data:  Urine culture 12/23 Blood culture x2 was 12/23: E coli  Abscess cx 12/24 >> GNR >> Respiratory panel by PCR 12/23: Negative for Covid and influenza.  Antimicrobials:  Azithromycin and ceftriaxone 12/23 for 1 dose Zosyn 12/24>> 12/25 12/25 ceftx >> 12/27 Ancef 12/28 >   Interim History / Subjective:  No events overnight  Objective   Blood pressure (Abnormal) 141/58, pulse 99, temperature 99.3 F (37.4 C), temperature source Axillary, resp. rate 20, height 5\' 2"  (1.575 m), weight 81.1 kg, SpO2 96 %.        Intake/Output Summary (Last 24 hours) at 08/06/2020 0930 Last data filed at 08/06/2020 0600 Gross per 24 hour  Intake 1143.75 ml  Output 2100 ml  Net -956.25 ml   Filed Weights   07/31/20 1702 08/01/20 0000  Weight: 79.4 kg 81.1 kg    Examination:  General this is a 59 year old female patient she is resting comfortably in bed she is in no acute distress this morning HEENT normocephalic atraumatic no jugular venous distention is appreciated mucous membranes are moist sclera nonicteric Pulmonary: Clear to auscultation currently 2 L/min via nasal cannula no accessory use noted Cardiac: Regular rhythm no murmur rub or gallop Abdomen: Soft, reports mild abdominal discomfort to palpation however also reports this is improved when comparing to previous day exam. Her left abscess drain and nephrostomy drains are patent GU clear yellow Extremities warm dry with brisk capillary refill and strong pulses Neuro awake, oriented x2.  Moves all extremities but she is weak  Resolved problem list  Septic shock  Severe hyponatremia requiring  3% saline  hyperosmolar nonketotic hyperglycemia Hypokalemia exacerbated by bicarbonate infusion Assessment & Plan:   Severe sepsis with E. coli bacteremia in the setting of left emphysematous pyelonephritis/ with perinephritic abscess, s/p perc nephrostomy and perirenal abscess drainage 12/24 -cbc trending down Plan Continue Ancef, length of therapy still to be determined I think imaging will be helpful here  Plan is to repeat imaging once serum creatinine normalized, urology would like IV contrast Continue drain management as directed by interventional radiology Keep Foley in place as recommended by urology   Acute kidney injury - gradually improving.  Underlying renal function unknown.  Has not had medical care for about 30 years Likely at least somewhat exacerbated by left pyelonephritis with abscess Plan Keeping Foley in place Strict intake output Adjust medications renally  Insulin-dependent diabetes Hemoglobin A1c 12.2 Diabetes coordinator note reviewed Plan Continue Lantus 10 units twice daily Change insulin to a CHS  Severe hyponatremia -initially treated with 3% NS which has since been d/c'd and was switched to isotonic bicarb, changed to LR 12/28: Sodium continues to normalize Plan Decrease LR to 50 mL an hour Start diet A.m. chemistry   Multiple pulmonary nodules. Differential includes metastatic malignancy especially if primary renal malignancy in play Doubt septic emboli from E. coli Plan She is supposed to get follow-up renal imaging, would be ideal to repeat CT chest at that time Suspect she will eventually need tissue sampling to rule out malignancy   Best practice (evaluated daily)  Diet: Regular, carb modified Pain/Anxiety/Delirium protocol (if indicated): Not indicated VAP protocol (if indicated): Not indicated DVT prophylaxis: Subcutaneous heparin GI prophylaxis: PPI Glucose control: SSI + lantus Mobility: Bedrest Disposition: ICU, MedSurg Code  Status: full  Simonne Martinet ACNP-BC [facility] Pulmonary/Critical Care Pager # [phone] OR # [phone] if no answer    Rutherford Guys, PA - [facility] [place] [facility] For pager details, please see AMION 08/06/2020, 9:30 AM

## 2020-08-06 NOTE — Progress Notes (Signed)
Urology Inpatient Progress Report      Intv/Subj: No acute events overnight. Patient is sitting up in bed talking with husband.    Principal Problem:   Multifocal pneumonia Active Problems:   Hyperosmolar hyperglycemic state (HHS) (HCC)   Hyponatremia   Hypertension   Acute renal failure (HCC)   Hypokalemia   Severe protein-calorie malnutrition (HCC)   Class 1 obesity   Leukocytosis   Hypomagnesemia   Hyperphosphatemia   Abdominal distention   Lung nodules   Delirium  Current Facility-Administered Medications  Medication Dose Route Frequency Provider Last Rate Last Admin  . albuterol (PROVENTIL) (2.5 MG/3ML) 0.083% nebulizer solution 2.5 mg  2.5 mg Nebulization Q4H PRN Bobette Mo, MD      . ceFAZolin (ANCEF) IVPB 2g/100 mL premix  2 g Intravenous Q8H Aleda Grana, RPH 200 mL/hr at 08/06/20 1400 Infusion Verify at 08/06/20 1400  . chlorhexidine (PERIDEX) 0.12 % solution 15 mL  15 mL Mouth Rinse BID Johnson, Clanford L, MD   15 mL at 08/06/20 1030  . Chlorhexidine Gluconate Cloth 2 % PADS 6 each  6 each Topical Daily Laural Benes, Clanford L, MD   6 each at 08/05/20 1710  . dextrose 50 % solution 0-50 mL  0-50 mL Intravenous PRN Fayrene Helper, PA-C      . heparin injection 5,000 Units  5,000 Units Subcutaneous Q8H Desai, Rahul P, PA-C   5,000 Units at 08/06/20 1324  . insulin aspart (novoLOG) injection 0-20 Units  0-20 Units Subcutaneous TID WC Olalere, Adewale A, MD   3 Units at 08/06/20 1232  . insulin aspart (novoLOG) injection 0-5 Units  0-5 Units Subcutaneous QHS Olalere, Adewale A, MD      . insulin glargine (LANTUS) injection 10 Units  10 Units Subcutaneous BID Oretha Milch, MD   10 Units at 08/06/20 1035  . lactated ringers infusion   Intravenous Continuous Delano Metz, MD   Paused at 08/06/20 1331  . MEDLINE mouth rinse  15 mL Mouth Rinse q12n4p Johnson, Clanford L, MD   15 mL at 08/06/20 1259  . ondansetron (ZOFRAN) tablet 4 mg  4 mg Oral Q6H PRN Bobette Mo, MD       Or  . ondansetron [facility]) injection 4 mg  4 mg Intravenous Q6H PRN Bobette Mo, MD      . sodium chloride 0.9 % bolus 1,588 mL  20 mL/kg Intravenous Once Fayrene Helper, PA-C      . sodium chloride flush (NS) 0.9 % injection 5 mL  5 mL Intracatheter Q8H Oley Balm, MD   5 mL at 08/06/20 1324     Objective: Vital: Vitals:   08/06/20 1000 08/06/20 1100 08/06/20 1200 08/06/20 1400  BP: (!) 154/62 134/67 (!) 158/86 (!) 145/71  Pulse: 93 93 95 95  Resp: (!) 21 (!) 24 (!) 24 20  Temp:   99.2 F (37.3 C)   TempSrc:   Axillary   SpO2: 96% 96% 97% 95%  Weight:      Height:       I/Os: I/O last 3 completed shifts: In: 1885.8 [I.V.:1606.6; IV Piggyback:279.1] Out: 3145 [Urine:2920; Drains:225]  Physical Exam:  General: Patient is in no apparent distress Lungs: Normal respiratory effort, chest expands symmetrically. GI:  The abdomen is soft and nontender Left PCN: purulent yellow urine Left perinephric drain: bloody/purulent drainage Foley: draining clear yellow urine Ext: lower extremities symmetric  Lab Results: Recent Labs    08/04/20 0745 08/05/20 0750 08/06/20 0220  WBC 16.3* 16.0* 15.5*  HGB 10.5* 10.4* 9.6*  HCT 29.2* 30.5* 29.6*   Recent Labs    08/04/20 0745 08/05/20 0750 08/06/20 0220  NA 129* 133* 135  K 3.2* 3.1* 3.8  CL 87* 89* 94*  CO2 29 30 29   GLUCOSE 104* 146* 87  BUN 87* 74* 56*  CREATININE 2.60* 1.84* 1.61*  CALCIUM 8.1* 8.1* 8.1*   No results for input(s): LABPT, INR in the last 72 hours. No results for input(s): LABURIN in the last 72 hours. Results for orders placed or performed during the hospital encounter of 07/31/20  Culture, blood (single)     Status: Abnormal   Collection Time: 07/31/20  5:26 PM   Specimen: BLOOD  Result Value Ref Range Status   Specimen Description   Final    BLOOD LEFT ANTECUBITAL Performed at [facility], [address] [place], [place]    Special Requests   Final     BOTTLES DRAWN AEROBIC AND ANAEROBIC Blood Culture adequate volume Performed at [facility], [address], [place], [place]    Culture  Setup Time   Final    GRAM NEGATIVE RODS AEROBIC AND ANEROBIC BOTTLES Gram Stain Report Called to,Read Back By and Verified With: LARIMORE, A. 12/24 @ 0740 BEARD, S.  CRITICAL RESULT CALLED TO, READ BACK BY AND VERIFIED WITH: PHARMD  1/25 Lucita Lora 1921 FCP Performed at [facility], [address], [place], [place]    Culture ESCHERICHIA COLI (A)  Final   Report Status 08/03/2020 FINAL  Final   Organism ID, Bacteria ESCHERICHIA COLI  Final      Susceptibility   Escherichia coli - MIC*    AMPICILLIN <=2 SENSITIVE Sensitive     CEFAZOLIN <=4 SENSITIVE Sensitive     CEFEPIME <=0.12 SENSITIVE Sensitive     CEFTAZIDIME <=1 SENSITIVE Sensitive     CEFTRIAXONE <=0.25 SENSITIVE Sensitive     CIPROFLOXACIN <=0.25 SENSITIVE Sensitive     GENTAMICIN <=1 SENSITIVE Sensitive     IMIPENEM <=0.25 SENSITIVE Sensitive     TRIMETH/SULFA <=20 SENSITIVE Sensitive     AMPICILLIN/SULBACTAM <=2 SENSITIVE Sensitive     PIP/TAZO <=4 SENSITIVE Sensitive     * ESCHERICHIA COLI  Blood Culture ID Panel (Reflexed)     Status: Abnormal   Collection Time: 07/31/20  5:26 PM  Result Value Ref Range Status   Enterococcus faecalis NOT DETECTED NOT DETECTED Final   Enterococcus Faecium NOT DETECTED NOT DETECTED Final   Listeria monocytogenes NOT DETECTED NOT DETECTED Final   Staphylococcus species NOT DETECTED NOT DETECTED Final   Staphylococcus aureus (BCID) NOT DETECTED NOT DETECTED Final   Staphylococcus epidermidis NOT DETECTED NOT DETECTED Final   Staphylococcus lugdunensis NOT DETECTED NOT DETECTED Final   Streptococcus species NOT DETECTED NOT DETECTED Final   Streptococcus agalactiae NOT DETECTED NOT DETECTED Final   Streptococcus pneumoniae NOT DETECTED NOT DETECTED Final   Streptococcus pyogenes NOT DETECTED NOT DETECTED Final    A.calcoaceticus-baumannii NOT DETECTED NOT DETECTED Final   Bacteroides fragilis NOT DETECTED NOT DETECTED Final   Enterobacterales DETECTED (A) NOT DETECTED Final    Comment: Enterobacterales represent a large order of gram negative bacteria, not a single organism. CRITICAL RESULT CALLED TO, READ BACK BY AND VERIFIED WITH: PHARMD  TERRY G. 08/02/20 1921 FCP    Enterobacter cloacae complex NOT DETECTED NOT DETECTED Final   Escherichia coli DETECTED (A) NOT DETECTED Final    Comment: CRITICAL RESULT CALLED TO,  READ BACK BY AND VERIFIED WITH: PHARMD  TERRY G. [id number] 1921 FCP    Klebsiella aerogenes NOT DETECTED NOT DETECTED Final   Klebsiella oxytoca NOT DETECTED NOT DETECTED Final   Klebsiella pneumoniae NOT DETECTED NOT DETECTED Final   Proteus species NOT DETECTED NOT DETECTED Final   Salmonella species NOT DETECTED NOT DETECTED Final   Serratia marcescens NOT DETECTED NOT DETECTED Final   Haemophilus influenzae NOT DETECTED NOT DETECTED Final   Neisseria meningitidis NOT DETECTED NOT DETECTED Final   Pseudomonas aeruginosa NOT DETECTED NOT DETECTED Final   Stenotrophomonas maltophilia NOT DETECTED NOT DETECTED Final   Candida albicans NOT DETECTED NOT DETECTED Final   Candida auris NOT DETECTED NOT DETECTED Final   Candida glabrata NOT DETECTED NOT DETECTED Final   Candida krusei NOT DETECTED NOT DETECTED Final   Candida parapsilosis NOT DETECTED NOT DETECTED Final   Candida tropicalis NOT DETECTED NOT DETECTED Final   Cryptococcus neoformans/gattii NOT DETECTED NOT DETECTED Final   CTX-M ESBL NOT DETECTED NOT DETECTED Final   Carbapenem resistance IMP NOT DETECTED NOT DETECTED Final   Carbapenem resistance KPC NOT DETECTED NOT DETECTED Final   Carbapenem resistance NDM NOT DETECTED NOT DETECTED Final   Carbapenem resist OXA 48 LIKE NOT DETECTED NOT DETECTED Final   Carbapenem resistance VIM NOT DETECTED NOT DETECTED Final    Comment: Performed at [facility], [address] [place], [place] [address]  Resp Panel by RT-PCR (Flu A&B, Covid) Nasopharyngeal Swab     Status: None   Collection Time: 07/31/20  5:39 PM   Specimen: Nasopharyngeal Swab; Nasopharyngeal(NP) swabs in vial transport medium  Result Value Ref Range Status   SARS Coronavirus 2 by RT PCR NEGATIVE NEGATIVE Final    Comment: (NOTE) SARS-CoV-2 target nucleic acids are NOT DETECTED.  The SARS-CoV-2 RNA is generally detectable in upper respiratory specimens during the acute phase of infection. The lowest concentration of SARS-CoV-2 viral copies this assay can detect is 138 copies/mL. A negative result does not preclude SARS-Cov-2 infection and should not be used as the sole basis for treatment or other patient management decisions. A negative result may occur with  improper specimen collection/handling, submission of specimen other than nasopharyngeal swab, presence of viral mutation(s) within the areas targeted by this assay, and inadequate number of viral copies(<138 copies/mL). A negative result must be combined with clinical observations, patient history, and epidemiological information. The expected result is Negative.  Fact Sheet for Patients:  BloggerCourse.com  Fact Sheet for Healthcare Providers:  SeriousBroker.it  This test is no t yet approved or cleared by the [place] FDA and  has been authorized for detection and/or diagnosis of SARS-CoV-2 by FDA under an Emergency Use Authorization (EUA). This EUA will remain  in effect (meaning this test can be used) for the duration of the COVID-19 declaration under Section 564(b)(1) of the Act, 21 U.S.C.section 360bbb-3(b)(1), unless the authorization is terminated  or revoked sooner.       Influenza A by PCR NEGATIVE NEGATIVE Final   Influenza B by PCR NEGATIVE NEGATIVE Final    Comment: (NOTE) The Xpert Xpress SARS-CoV-2/FLU/RSV plus assay is intended as an aid in the  diagnosis of influenza from Nasopharyngeal swab specimens and should not be used as a sole basis for treatment. Nasal washings and aspirates are unacceptable for Xpert Xpress SARS-CoV-2/FLU/RSV testing.  Fact Sheet for Patients: BloggerCourse.com  Fact Sheet for Healthcare Providers: SeriousBroker.it  This test is not yet approved or cleared by the  [place] [employer] FDA and has been authorized for detection and/or diagnosis of SARS-CoV-2 by FDA under an TEFL teachermergency Use Authorization (EUA). This EUA will remain in effect (meaning this test can be used) for the duration of the COVID-19 declaration under Section 564(b)(1) of the Act, 21 U.S.C. section 360bbb-3(b)(1), unless the authorization is terminated or revoked.  Performed at [facility], [address] [place], [place] [address]   Culture, blood (single)     Status: None   Collection Time: 07/31/20  8:23 PM   Specimen: BLOOD RIGHT WRIST  Result Value Ref Range Status   Specimen Description BLOOD RIGHT WRIST  Final   Special Requests   Final    BOTTLES DRAWN AEROBIC AND ANAEROBIC Blood Culture results may not be optimal due to an excessive volume of blood received in culture bottles   Culture   Final    NO GROWTH 5 DAYS Performed at [facility], [address] [place], [place] [address]    Report Status 08/05/2020 FINAL  Final  Urine Culture     Status: Abnormal   Collection Time: 08/01/20 11:28 AM   Specimen: Urine, Clean Catch  Result Value Ref Range Status   Specimen Description   Final    URINE, CLEAN CATCH Performed at [facility], [address] [place], [place] [address]    Special Requests   Final    NONE Performed at [facility], [address] [place], [place] [address]    Culture (A)  Final    <10,000 COLONIES/mL INSIGNIFICANT GROWTH Performed at [facility], [address] [place], [place] [address]    Report Status 08/02/2020 FINAL  Final  MRSA PCR  Screening     Status: None   Collection Time: 08/01/20  3:26 PM   Specimen: Nasopharyngeal  Result Value Ref Range Status   MRSA by PCR NEGATIVE NEGATIVE Final    Comment:        The GeneXpert MRSA Assay (FDA approved for NASAL specimens only), is one component of a comprehensive MRSA colonization surveillance program. It is not intended to diagnose MRSA infection nor to guide or monitor treatment for MRSA infections. Performed at [facility], [address] [place], [place] [address]   Aerobic/Anaerobic Culture (surgical/deep wound)     Status: None (Preliminary result)   Collection Time: 08/01/20  7:37 PM   Specimen: Urine, Catheterized  Result Value Ref Range Status   Specimen Description   Final    URINE, CATHETERIZED URINE FROM DRAIN Performed at [facility], [address] [place], [place] [address]    Special Requests   Final    NONE Performed at [facility], [address] [place], [place] [address]    Gram Stain   Final    ABUNDANT WBC PRESENT, PREDOMINANTLY PMN RARE GRAM NEGATIVE RODS RARE GRAM POSITIVE RODS Performed at [facility], [address] [place], [place] [address]    Culture   Final    FEW ESCHERICHIA COLI NO ANAEROBES ISOLATED; CULTURE IN PROGRESS FOR 5 DAYS    Report Status PENDING  Incomplete   Organism ID, Bacteria ESCHERICHIA COLI  Final      Susceptibility   Escherichia coli - MIC*    AMPICILLIN 4 SENSITIVE Sensitive     CEFAZOLIN <=4 SENSITIVE Sensitive     CEFEPIME <=0.12 SENSITIVE Sensitive     CEFTRIAXONE <=0.25 SENSITIVE Sensitive     CIPROFLOXACIN <=0.25 SENSITIVE Sensitive  GENTAMICIN <=1 SENSITIVE Sensitive     IMIPENEM <=0.25 SENSITIVE Sensitive     NITROFURANTOIN <=16 SENSITIVE Sensitive     TRIMETH/SULFA <=20 SENSITIVE Sensitive     AMPICILLIN/SULBACTAM <=2 SENSITIVE Sensitive     PIP/TAZO <=4 SENSITIVE Sensitive     * FEW ESCHERICHIA COLI   Aerobic/Anaerobic Culture (surgical/deep wound)     Status: None (Preliminary result)   Collection Time: 08/01/20  7:38 PM   Specimen: Abscess  Result Value Ref Range Status   Specimen Description   Final    ABSCESS Performed at [facility], [address], [place], [place] [address]    Special Requests   Final    NONE Performed at [facility], [address], [place], [place] [address]    Gram Stain   Final    ABUNDANT WBC PRESENT, PREDOMINANTLY PMN MODERATE GRAM NEGATIVE RODS Performed at [facility], [address], [place], [place] [address]    Culture   Final    ABUNDANT ESCHERICHIA COLI NO ANAEROBES ISOLATED; CULTURE IN PROGRESS FOR 5 DAYS    Report Status PENDING  Incomplete   Organism ID, Bacteria ESCHERICHIA COLI  Final      Susceptibility   Escherichia coli - MIC*    AMPICILLIN <=2 SENSITIVE Sensitive     CEFAZOLIN <=4 SENSITIVE Sensitive     CEFEPIME <=0.12 SENSITIVE Sensitive     CEFTAZIDIME <=1 SENSITIVE Sensitive     CEFTRIAXONE <=0.25 SENSITIVE Sensitive     CIPROFLOXACIN <=0.25 SENSITIVE Sensitive     GENTAMICIN <=1 SENSITIVE Sensitive     IMIPENEM <=0.25 SENSITIVE Sensitive     TRIMETH/SULFA <=20 SENSITIVE Sensitive     AMPICILLIN/SULBACTAM <=2 SENSITIVE Sensitive     PIP/TAZO <=4 SENSITIVE Sensitive     * ABUNDANT ESCHERICHIA COLI    Studies/Results: No results found.  Assessment: 59 year old woman with severeleft emphysematous pyelonephritis,4 mm left UPJcalculus and clinicalsigns ofseveresepsis with admission labsWBC 44, hyponatremia,and glucose 706now clinically improving.  1. Emphysematous pyelonephritis: -antibiotics have been narrowed according to urine/blood cultures (pan sensitive E coli) -recommend foley for maximum decompression of infected urine -Continue LEFT perirenal drain and LEFT PCN tube as both continue to put out purulent drainage -greatly appreciate IR evaluation and  treatment -would like to reimage left kidney at some point once creatinine stabilizes ideally with contrast   2. UPJ calculus with mild obstructive uropathy and AKI: -Creatinine continues to improve with Left PCN drain -will continue to trend Cr and plan for reimaging in future  Tammie Knudsen, MD Urology 08/06/2020, 3:11 PM

## 2020-08-06 NOTE — Progress Notes (Addendum)
Pharmacy Antibiotic Note  Tammie [name] is a 59 y.o. female admitted on 07/31/2020 with E coli bacteremia.  Pharmacy has been consulted for Cefazolin dosing.  Plan: Narrowed abx from Ceftriaxone to Ancef 2gm adjusted to q8 for E coli bacteremia  Height: 5\' 2"  (157.5 cm) Weight: 81.1 kg (178 lb 12.7 oz) IBW/kg (Calculated) : 50.1  Temp (24hrs), Avg:98 F (36.7 C), Min:97.4 F (36.3 C), Max:98.6 F (37 C)  Recent Labs  Lab 07/31/20 1758 07/31/20 1913 07/31/20 2155 08/02/20 0400 08/03/20 0412 08/03/20 0445 08/03/20 1319 08/04/20 0115 08/04/20 0745 08/05/20 0750 08/06/20 0220  WBC 44.0*  --    < > 47.6* 30.3*  --   --   --  16.3* 16.0* 15.5*  CREATININE  --  4.19*   < >  --   --    < > 3.20* 2.90* 2.60* 1.84* 1.61*  LATICACIDVEN 2.0* 1.9  --   --   --   --   --   --   --   --   --    < > = values in this interval not displayed.    Estimated Creatinine Clearance: 37.1 mL/min (A) (by C-G formula based on SCr of 1.61 mg/dL (H)).    Allergies  Allergen Reactions  . Codeine Rash   Antimicrobials this admission: Zosyn 12/24 >> 12/24 Azith/CTX 12/23 >> 12/24 CTX 12/23 >> 12/27 Ancef 12/27 >>  Dose adjustments this admission: 12/29 SCr decr further to 1.61, Cl 37 ml/min, Ancef 2gm from q12 > q8  Microbiology results: 12/23 BCx: BCID 2/4 E coli, no resistance 12/24 UCx (clean catch): <10k insignificant growth 12/23 Strep pneumo: neg 12/24 Legionella: negative 12/24 Abscess: E coli, pan-sens 12/24 MRSA PCR: neg  Thank you for allowing pharmacy to be a part of this patient's care.  1/25 PharmD [facility] [phone] 08/06/2020 8:49 AM

## 2020-08-06 NOTE — TOC Progression Note (Signed)
Transition of Care [facility]) - Progression Note    Patient Details  Name: Tammie [name] MRN: [id number] Date of Birth: 1960-09-26  Transition of Care [facility]) CM/SW Contact  Golda Acre, RN Phone Number: 08/06/2020, 6:57 AM  Clinical Narrative:    Assessment/ Plan:   1. AKI - unknown creat baseline. Due to combination of obstructive and ATN related to UPJ stone, urosepsis/ pyelonephritis. With L PCN and drain, IV abx and IVF's creat has improved from 3.9 >> 1.8 today. Very good UOP. Does not need renal f/u.  Will sign off.  2.  Hyponatremia: sp 3% saline, resolved. Na 133 today.  3.  Sepsis with left emphysematous pyelonephritis/UPJ stone: Status post L PCN and L renal abscess drain done 12/24, and getting IV abx w/ ceftriaxone with improving leukocytosis. Urology following.  4.  Hypokalemia: K 3.1, replete prn 5.  Anion gap metabolic acidosis: Secondary to acute kidney injury/lactic acidosis of sepsis-ongoing treatment of underlying sepsis with antibiotic therapy/percutaneous drainage and on isotonic sodium bicarbonate.  Subjective:   No c/o. Still looks tired but dressed and glasses on today.  Creat down 1.8 and Na 133 today   PLan home with slef care Progression: iv ancef, iv lr at 50cc/hr labs as above.   Expected Discharge Plan: Home/Self Care Barriers to Discharge: No Barriers Identified  Expected Discharge Plan and Services Expected Discharge Plan: Home/Self Care   Discharge Planning Services: CM Consult   Living arrangements for the past 2 months: Single Family Home                                       Social Determinants of Health (SDOH) Interventions    Readmission Risk Interventions No flowsheet data found.

## 2020-08-07 ENCOUNTER — Inpatient Hospital Stay (HOSPITAL_COMMUNITY): Payer: Self-pay

## 2020-08-07 DIAGNOSIS — B9620 Unspecified Escherichia coli [E. coli] as the cause of diseases classified elsewhere: Secondary | ICD-10-CM

## 2020-08-07 DIAGNOSIS — R6520 Severe sepsis without septic shock: Secondary | ICD-10-CM | POA: Diagnosis present

## 2020-08-07 DIAGNOSIS — J90 Pleural effusion, not elsewhere classified: Secondary | ICD-10-CM | POA: Diagnosis not present

## 2020-08-07 DIAGNOSIS — N132 Hydronephrosis with renal and ureteral calculous obstruction: Secondary | ICD-10-CM | POA: Diagnosis not present

## 2020-08-07 DIAGNOSIS — N10 Acute tubulo-interstitial nephritis: Secondary | ICD-10-CM | POA: Diagnosis not present

## 2020-08-07 DIAGNOSIS — N151 Renal and perinephric abscess: Secondary | ICD-10-CM

## 2020-08-07 DIAGNOSIS — A419 Sepsis, unspecified organism: Secondary | ICD-10-CM | POA: Diagnosis not present

## 2020-08-07 DIAGNOSIS — R7881 Bacteremia: Secondary | ICD-10-CM

## 2020-08-07 DIAGNOSIS — R918 Other nonspecific abnormal finding of lung field: Secondary | ICD-10-CM

## 2020-08-07 DIAGNOSIS — R0989 Other specified symptoms and signs involving the circulatory and respiratory systems: Secondary | ICD-10-CM

## 2020-08-07 DIAGNOSIS — E1110 Type 2 diabetes mellitus with ketoacidosis without coma: Secondary | ICD-10-CM

## 2020-08-07 DIAGNOSIS — N12 Tubulo-interstitial nephritis, not specified as acute or chronic: Secondary | ICD-10-CM | POA: Diagnosis not present

## 2020-08-07 DIAGNOSIS — Z978 Presence of other specified devices: Secondary | ICD-10-CM | POA: Diagnosis not present

## 2020-08-07 DIAGNOSIS — K6389 Other specified diseases of intestine: Secondary | ICD-10-CM | POA: Diagnosis not present

## 2020-08-07 LAB — BASIC METABOLIC PANEL
Anion gap: 12 (ref 5–15)
BUN: 41 mg/dL — ABNORMAL HIGH (ref 6–20)
CO2: 29 mmol/L (ref 22–32)
Calcium: 8.3 mg/dL — ABNORMAL LOW (ref 8.9–10.3)
Chloride: 94 mmol/L — ABNORMAL LOW (ref 98–111)
Creatinine, Ser: 1.27 mg/dL — ABNORMAL HIGH (ref 0.44–1.00)
GFR, Estimated: 49 mL/min — ABNORMAL LOW (ref >60)
Glucose, Bld: 113 mg/dL — ABNORMAL HIGH (ref 70–99)
Potassium: 3.2 mmol/L — ABNORMAL LOW (ref 3.5–5.1)
Sodium: 135 mmol/L (ref 135–145)

## 2020-08-07 LAB — MAGNESIUM: Magnesium: 1.6 mg/dL — ABNORMAL LOW (ref 1.7–2.4)

## 2020-08-07 LAB — CBC
HCT: 33 % — ABNORMAL LOW (ref 36.0–46.0)
Hemoglobin: 10.6 g/dL — ABNORMAL LOW (ref 12.0–15.0)
MCH: 30.5 pg (ref 26.0–34.0)
MCHC: 32.1 g/dL (ref 30.0–36.0)
MCV: 95.1 fL (ref 80.0–100.0)
Platelets: 317 10*3/uL (ref 150–400)
RBC: 3.47 MIL/uL — ABNORMAL LOW (ref 3.87–5.11)
RDW: 15.1 % (ref 11.5–15.5)
WBC: 15.2 10*3/uL — ABNORMAL HIGH (ref 4.0–10.5)
nRBC: 0 % (ref 0.0–0.2)

## 2020-08-07 LAB — GLUCOSE, CAPILLARY
Glucose-Capillary: 118 mg/dL — ABNORMAL HIGH (ref 70–99)
Glucose-Capillary: 173 mg/dL — ABNORMAL HIGH (ref 70–99)
Glucose-Capillary: 240 mg/dL — ABNORMAL HIGH (ref 70–99)
Glucose-Capillary: 122 mg/dL — ABNORMAL HIGH (ref 70–99)

## 2020-08-07 LAB — AEROBIC/ANAEROBIC CULTURE W GRAM STAIN (SURGICAL/DEEP WOUND)

## 2020-08-07 MED ORDER — LACTATED RINGERS IV SOLN: INTRAVENOUS | Status: DC

## 2020-08-07 MED ORDER — MAGNESIUM SULFATE 4 GM/100ML IV SOLN
4.0000 g | Freq: Once | INTRAVENOUS | Status: AC
  Administered 2020-08-07: 13:00:00 4 g via INTRAVENOUS
  Filled 2020-08-07: qty 100

## 2020-08-07 MED ORDER — POTASSIUM CHLORIDE 20 MEQ PO PACK
40.0000 meq | PACK | ORAL | Status: DC
  Filled 2020-08-07 (×2): qty 2

## 2020-08-07 MED ORDER — INSULIN GLARGINE 100 UNIT/ML ~~LOC~~ SOLN
8.0000 [IU] | Freq: Two times a day (BID) | SUBCUTANEOUS | Status: DC
  Administered 2020-08-07 – 2020-08-19 (×22): 8 [IU] via SUBCUTANEOUS
  Filled 2020-08-07 (×27): qty 0.08

## 2020-08-07 MED ORDER — POTASSIUM CHLORIDE 10 MEQ/100ML IV SOLN
10.0000 meq | INTRAVENOUS | Status: AC
  Administered 2020-08-07 (×4): 10 meq via INTRAVENOUS
  Filled 2020-08-07: qty 100

## 2020-08-07 MED ORDER — GLUCERNA SHAKE PO LIQD
237.0000 mL | Freq: Two times a day (BID) | ORAL | Status: DC
  Administered 2020-08-09 – 2020-08-13 (×3): 237 mL via ORAL
  Filled 2020-08-07 (×25): qty 237

## 2020-08-07 MED ORDER — POTASSIUM CHLORIDE CRYS ER 20 MEQ PO TBCR
40.0000 meq | EXTENDED_RELEASE_TABLET | ORAL | Status: DC
  Administered 2020-08-07: 11:00:00 40 meq via ORAL
  Filled 2020-08-07: qty 2

## 2020-08-07 NOTE — Progress Notes (Signed)
Inpatient Diabetes Program Recommendations  AACE/ADA: New Consensus Statement on Inpatient Glycemic Control (2015)  Target Ranges:  Prepandial:   less than 140 mg/dL      Peak postprandial:   less than 180 mg/dL (1-2 hours)      Critically ill patients:  140 - 180 mg/dL   Lab Results  Component Value Date   GLUCAP 173 (H) 08/07/2020   HGBA1C 12.2 (H) 08/01/2020    Review of Glycemic Control  Diabetes history: New-onset DM Outpatient Diabetes medications: None Current orders for Inpatient glycemic control: Lantus 8 units bid, Novolog 0-20 units tidwc and hs  HgbA1C - 12.2% 113, 118, 173 mg/dL Eating approx 50% meals  Inpatient Diabetes Program Recommendations:     Agree with orders.  Spoke with pt and husband at bedside this am. Continued with education regarding new-onset DM. Educated patient and spouse on insulin pen use at home. Reviewed contents of insulin flexpen starter kit. Reviewed all steps if insulin pen including attachment of needle, 2-unit air shot, dialing up dose, giving injection, removing needle, disposal of sharps, storage of unused insulin, disposal of insulin etc. Patient able to provide successful return demonstration. Also reviewed troubleshooting with insulin pen. MD to give patient Rxs for insulin pens and insulin pen needles.  Continue to follow.  Thank you. Lorenda Peck, RD, LDN, CDE Inpatient Diabetes Coordinator [phone]

## 2020-08-07 NOTE — Progress Notes (Signed)
Referring Physician(s): Dr. Arita Miss  Supervising Physician: Irish Lack  Patient Status:  [facility] - In-pt  Chief Complaint: Left hydronephrosis secondary to calculi; left pyelonephritis and abscess inferior to the left kidney. S/p left PCN and left perinephric abscess drain 08/01/20 by Dr. Deanne Coffer.   Subjective: Patient is sitting on the edge of the bed, her husband is at the beside. She was recently transferred out of the ICU. She states she mostly feels tired, not too much pain.   Allergies: Codeine  Medications: Prior to Admission medications   Medication Sig Start Date End Date Taking? Authorizing Provider  acetaminophen (TYLENOL) 500 MG tablet Take 1,000 mg by mouth every 6 (six) hours as needed.   Yes [provider]  Pseudoeph-Doxylamine-DM-APAP (NYQUIL PO) Take 1 tablet by mouth daily as needed (congestion).   Yes [provider]     Vital Signs: BP 131/71 (BP Location: Left Arm)   Pulse 88   Temp 98 F (36.7 C) (Oral)   Resp 18   Ht 5\' 2"  (1.575 m)   Wt 177 lb 11.1 oz (80.6 kg)   LMP  (LMP Unknown)   SpO2 94%   BMI 32.50 kg/m   Physical Exam Constitutional:      General: She is not in acute distress. Pulmonary:     Effort: Pulmonary effort is normal.  Abdominal:     Palpations: Abdomen is soft.     Comments: Positive left flank drain to gravity bag. Site is unremarkable with no erythema, edema, tenderness, bleeding or drainage noted at exit site. Dressing is clean, dry and intact. 100 ml of  Sanguinopurulent fluid noted in gravity bag. Drain is able to be flushed easily.   Genitourinary:    Comments: Positive left nephrostomy tube to gravity bag. Site is unremarkable with no erythema, edema, tenderness, bleeding or drainage noted at exit site. Dressing is clean dry and intact. 125 ml of yellow urine noted in gravity bag. Skin:    General: Skin is warm and dry.  Neurological:     Mental Status: She is alert and oriented to person, place, and  time.    Imaging: DG Chest 2 View  Result Date: 08/07/2020 CLINICAL DATA:  Sepsis.  Left pyelonephritis. EXAM: CHEST - 2 VIEW COMPARISON:  08/04/2020 FINDINGS: Stable cardiomegaly. Small left pleural effusion and left basilar atelectasis or infiltrate show no significant change. Probable small layering right pleural effusion also noted. Increased airspace opacity is seen in the right upper lobe since prior study. IMPRESSION: Increased right upper lobe airspace opacity. Stable small left pleural effusion, and left basilar atelectasis versus infiltrate. Probable small layering right pleural effusion. Electronically Signed   By: 08/06/2020 M.D.   On: 08/07/2020 12:54   DG Chest Port 1 View  Result Date: 08/04/2020 CLINICAL DATA:  59 year old female with respiratory failure, sepsis, emphysematous left renal pyelonephritis status post CT-guided nephrostomy, abscess drain. EXAM: PORTABLE CHEST 1 VIEW COMPARISON:  CT Chest, Abdomen, and Pelvis 08/01/2020 and earlier. FINDINGS: Portable AP semi upright view at 0541 hours. Lower lung volumes. Stable cardiac size and mediastinal contours. Increased left lung base opacity. Additional patchy and nodular bilateral pulmonary opacity better demonstrated by CT and not significantly changed. No pneumothorax. No pulmonary edema. No right pleural effusion. Visible bowel-gas within normal limits. No acute osseous abnormality identified. IMPRESSION: 1. Lower lung volumes with increased left lung base opacity which could be related to combination of pleural effusion, atelectasis, or pneumonia. 2. Scattered patchy and nodular lung opacity  elsewhere stable and suspicious for hematogenous disseminated infection (see CT 08/01/2020). Electronically Signed   By: Odessa Fleming M.D.   On: 08/04/2020 05:58   DG Abd 2 Views  Result Date: 08/07/2020 CLINICAL DATA:  Sepsis.  Left pyelonephritis. EXAM: ABDOMEN - 2 VIEW COMPARISON:  None. FINDINGS: Mild gaseous distention of colon is seen  predominately involving the nondependent sigmoid colon. Scattered air-fluid levels are seen, however there is no evidence of dilated small bowel loops. These findings are likely due to mild ileus. No radiopaque urinary calculi identified. Two percutaneous pigtail catheters are seen in the left upper and lower quadrants. IMPRESSION: Probable mild adynamic ileus. Two percutaneous pigtail catheters in left upper and lower quadrants. Electronically Signed   By: Danae Orleans M.D.   On: 08/07/2020 12:51    Labs:  CBC: Recent Labs    08/04/20 0745 08/05/20 0750 08/06/20 0220 08/07/20 0506  WBC 16.3* 16.0* 15.5* 15.2*  HGB 10.5* 10.4* 9.6* 10.6*  HCT 29.2* 30.5* 29.6* 33.0*  PLT 308 315 308 317    COAGS: No results for input(s): INR, APTT in the last 8760 hours.  BMP: Recent Labs    08/04/20 0745 08/05/20 0750 08/06/20 0220 08/07/20 0506  NA 129* 133* 135 135  K 3.2* 3.1* 3.8 3.2*  CL 87* 89* 94* 94*  CO2 29 30 29 29   GLUCOSE 104* 146* 87 113*  BUN 87* 74* 56* 41*  CALCIUM 8.1* 8.1* 8.1* 8.3*  CREATININE 2.60* 1.84* 1.61* 1.27*  GFRNONAA 21* 31* 37* 49*    LIVER FUNCTION TESTS: Recent Labs    07/31/20 1913 08/01/20 0641  BILITOT 1.3* 0.9  AST 14* 19  ALT 16 19  ALKPHOS 147* 142*  PROT 5.4* 5.4*  ALBUMIN 2.0* 2.1*    Assessment and Plan:  Left hydronephrosis secondary to calculi; left pyelonephritis and abscess inferior to the left kidney. S/p left PCN and left perinephric abscess 08/01/20: Patient is afebrile, BUN/Cr are decreasing. WBC is stable at 15.2. Per Epic, Left PCN output is 285 ml with approx. 125 ml output in gravity bag; left abscess drain output is 155 ml with another 100 ml output in the gravity bag.   Recommend team continue flushing perinephric abscess drain and documenting the output each shift. Continue to document PCN output. Change dressings as needed. Other plans per primary teams. IR will continue to follow.   Electronically Signed: 08/03/20, AGACNP-BC [phone] 08/07/2020, 1:30 PM   I spent a total of 15 Minutes at the the patient's bedside AND on the patient's hospital floor or unit, greater than 50% of which was counseling/coordinating care for left PCN and left abscess drain.

## 2020-08-07 NOTE — Progress Notes (Signed)
PROGRESS NOTE    Tammie [name]  ZOX:[id number] DOB: 1960/10/02 DOA: 07/31/2020 PCP: Patient, No Pcp Per    Chief Complaint  Patient presents with  . Abdominal Pain    Brief Narrative: 59 year old female admitted 12/24 with chief complaint of abdominal pain.  Found to have acute left pyelonephrosis with abscess, severe sepsis, acute kidney injury, hyper osmolar nonketotic hyperglycemia, severe hyponatremia  -CT imaging showed multiple pulmonary nodules, imaging of abdomen pelvis With left hydronephrosis and evidence of perinephritic abscess Was  transferred to [facility] for placement of left nephrostomy tube and left perirenal drain Critical care asked to admit given severe hyponatremia and concern for clinical decompensation   Assessment & Plan:   Principal Problem:   Severe sepsis (HCC) Active Problems:   Multifocal pneumonia   Hyperosmolar hyperglycemic state (HHS) (HCC)   Hyponatremia   Hypertension   Acute renal failure (HCC)   Hypokalemia   Severe protein-calorie malnutrition (HCC)   Class 1 obesity   Leukocytosis   Hypomagnesemia   Hyperphosphatemia   Abdominal distention   Lung nodules   Delirium  #1 severe sepsis with E. coli bacteremia/left emphysematous pyelonephritis with perinephric abscess Patient noted on admission to meet criteria for sepsis with 44,000, glucose level of 700s, nonanion gap metabolic acidosis, acute renal failure, severe hyponatremia with a sodium level as low as 101 admitted to the critical care service.  Patient status post percutaneous nephrostomy and perirenal abscess drainage 08/01/2020.  Patient improving clinically.  Leukocytosis trending down.  Patient currently was on IV antibiotics that was narrowed down to IV Ancef.  Urology following and plan is to repeat imaging once renal function has normalized with IV contrast.  IR following and managing drain.  Continue Foley catheter as recommended per urology.  Urology following and appreciate  input and recommendations.  Supportive care.  Follow.  2.  Acute renal failure Questionable etiology.  Patient has not had medical care in about 30 years and as such underlying baseline renal function unknown.  Patient on admission was seen in consultation by nephrology.  Is felt patient's acute renal failure likely multifactorial secondary to emphysematous pyelonephritis with abscess, ischemic ATN in the setting of volume depletion.  Patient status post percutaneous nephrostomy and perirenal abscess drainage.  Foley catheter in place.  Urine output of 2.385 L over the past 24 hours.  Renal function improving and trending down.  Creatinine at 1.27.  Patient with noted's bibasilar crackles and as such we will saline lock IV fluids.  Urology following and appreciate input and recommendations.  3.  Abdominal discomfort Patient with some complaints of abdominal discomfort could likely be secondary to problem #1.  Patient with abdominal distention noted on examination.  Patient did state had some loose bowel movements today.  Check abdominal films.  Follow.  4.  Hypokalemia/hypomagnesemia Magnesium level at 1.6.  Potassium at 3.2.  Patient refusing oral potassium intake.  Will place on IV potassium runs.  Magnesium sulfate 4 g IV x1.  Repeat labs in the morning.   5.  Newly diagnosed diabetes mellitus type 2/HHNK Hemoglobin A1c noted at 12.2.  Initially was on Lantus 10 units twice daily.  Decrease Lantus to 8 units twice daily.  Sliding scale insulin.  Diabetes coordinator following.  6.  Severe hyponatremia Questionable etiology.  Initially treated with 3% normal saline which has subsequently been discontinued.  Patient was switched to isotonic bicarb and changed to LR 08/05/2020.  Sodium level normalized.  Concern that patient may be developing volume  overload.  Saline lock IV fluids.  Follow.  6.  Multiple pulmonary nodules Differential includes septic emboli versus metastatic malignancy. Once  renal function improves urology recommending repeat CT with IV contrast.  Pulmonary recommended when repeat CT is done to get a repeat chest at that same time.  Patient will need outpatient follow-up with pulmonary.  Will need to inform pulmonary once CT chest has been done.  Patient will likely eventually need tissue sampling to rule out malignancy per pulmonary.  7.  Bibasilar crackles Noted on physical exam.  Check a chest x-ray.  Saline lock IV fluids.  Daily weights.  Strict I's and O's.  May need a dose of Lasix.  Follow.   DVT prophylaxis: Heparin Code Status: Full Family Communication: Updated patient and husband at bedside. Disposition:   Status is: Inpatient    Dispo: The patient is from: Home              Anticipated d/c is to: To be determined              Anticipated d/c date is: To be determined.              Patient currently on IV antibiotics, has percutaneous nephrostomy drains, not stable for discharge.       Consultants:   Urology: Dr. Arita Miss 08/01/2020  Nephrology: Dr. Arrie Aran 08/01/2020  General surgery: Dr. Henreitta Leber 08/01/2020([facility])   Significant Hospital Events:  12/24 admitted with chief complaint of abdominal pain.  Diagnostic imaging from CT abdomen pelvis showing left hydronephrosis, pyelonephritis, and perinephritic abscess.  Blood cultures preliminarily showing gram-negative rods, CT chest with multiple pulmonary nodules.Presenting blood glucose 706, presenting sodium 1 less than 102.  Beta hydroxybutyric acid was negative.  Was given IV hydration.  Started on empiric antibiotics.  Transferred to [facility] following urology consultation recommending left nephrostomy tube and perinephritic drain.  Critical care asked to admit: Left PCN in left perirenal abscess drain both placed on 12/24 12/25 Sodium improved from 10 2-1 1 4   , 3% saline stopped at 3 AM, placed on bicarbonate infusion. Off pressors 12/26-12/29: 12/27: Switched to Ancef  given sensitivities bicarb continued. 12/28: Bicarbonate changed to LR 12/29: Doing better.  Transferred to [facility] for pickup 08/07/2020   Procedures:  12/24 IR >> CT guided perc nephrostomy ,CT guided L pelvic abscess drain placement    Significant Diagnostic Tests:  CT chest abdomen and pelvis 12/24: Multiple bilateral pulmonary nodules up to 12 mm in diameter, they are ill-defined and shaggy in appearance 4 mm left UPJ calculus with mild left hydronephrosis and hydroureter the left kidney is emphysematous with a 6.4 x 3.6 x 5.9 diameter abscess CT head: Negative    Antimicrobials:  Azithromycin and ceftriaxone 12/23 for 1 dose Zosyn 12/24>> 12/25 12/25 ceftx >> 12/27  Ancef 12/28 >    Micro Data:  Urine culture 12/23 Blood culture x2 was 12/23: E coli  Abscess cx 12/24 >> GNR >> abundant E. coli Respiratory panel by PCR 12/23: Negative for Covid and influenza. Catheterized urine from drain 08/01/2020>>>> E. coli     Subjective: Patient sitting up in chair.  States she is not feeling too well.  Complain of a nonproductive cough.  Complain of some diffuse abdominal pain  Objective: Vitals:   08/06/20 1941 08/06/20 2043 08/06/20 2145 08/07/20 0555  BP: 127/73 135/69 (!) 128/58 131/71  Pulse: 88 87 91 88  Resp: Temp: 98.2 F (36.8 C)  98.3 F (36.8 C) 98.2 F (36.8 C) 98 F (36.7 C)  TempSrc: Oral Oral Oral Oral  SpO2: 90% 94% 94%   Weight:      Height:        Intake/Output Summary (Last 24 hours) at 08/07/2020 1117 Last data filed at 08/07/2020 0800 Gross per 24 hour  Intake 1017.96 ml  Output 2940 ml  Net -1922.04 ml   Filed Weights   07/31/20 1702 08/01/20 0000  Weight: 79.4 kg 81.1 kg    Examination:  General exam: Appears calm and comfortable  Respiratory system: Some bibasilar crackles in the bases.  No wheezing.  No rhonchi.  Normal respiratory effort.  Speaking in full sentences.  Cardiovascular system: Regular rate  rhythm no murmurs rubs or gallops.  No JVD.  No lower extremity edema. Gastrointestinal system: Abdomen is distended, soft, some diffuse tenderness to palpation.  Positive bowel sounds.  No rebound.  No guarding.  Central nervous system: Alert and oriented. No focal neurological deficits. Extremities: Symmetric 5 x 5 power. Skin: No rashes, lesions or ulcers Psychiatry: Judgement and insight appear normal. Mood & affect appropriate.     Data Reviewed: I have personally reviewed following labs and imaging studies  CBC: Recent Labs  Lab 07/31/20 1758 08/01/20 0641 08/02/20 0400 08/03/20 0412 08/04/20 0745 08/05/20 0750 08/06/20 0220 08/07/20 0506  WBC 44.0* 49.1* 47.6* 30.3* 16.3* 16.0* 15.5* 15.2*  NEUTROABS 40.1* 46.2* 43.5* 27.3*  --   --   --   --   HGB 12.6 11.4* 10.4* 10.5* 10.5* 10.4* 9.6* 10.6*  HCT 34.3* 30.9* 27.9* 28.3* 29.2* 30.5* 29.6* 33.0*  MCV 83.1 82.2 83.3 83.7 86.9 89.4 92.8 95.1  PLT 225 206 237 273 308 315 308 317    Basic Metabolic Panel: Recent Labs  Lab 08/01/20 0217 08/01/20 0641 08/03/20 0445 08/03/20 1319 08/04/20 0115 08/04/20 0500 08/04/20 0745 08/05/20 0750 08/06/20 0220 08/07/20 0506 08/07/20 0758  NA 105*   < > 124*   < > 128*  --  129* 133* 135 135  --   K 2.9*   < > 2.7*   < > 3.3*  --  3.2* 3.1* 3.8 3.2*  --   CL 74*   < > 87*   < > 88*  --  87* 89* 94* 94*  --   CO2 12*   < > 20*   < > 28  --  --   GLUCOSE 227*   < > 92   < > 114*  --  104* 146* 87 113*  --   BUN 108*   < > 103*   < > 93*  --  87* 74* 56* 41*  --   CREATININE 4.13*   < > 3.26*   < > 2.90*  --  2.60* 1.84* 1.61* 1.27*  --   CALCIUM 7.6*   < > 7.7*   < > 8.0*  --  8.1* 8.1* 8.1* 8.3*  --   MG 1.6*   < > 2.5*  --   --  2.2  --  1.8 1.6*  --  1.6*  PHOS 6.2*  --  6.7*  --   --   --   --  4.1 3.4  --   --    < > = values in this interval not displayed.    GFR: Estimated Creatinine Clearance: 47.1 mL/min (A) (by C-G formula based on SCr of 1.27 mg/dL  (H)).  Liver  Function Tests: Recent Labs  Lab 07/31/20 1913 08/01/20 0641  AST 14* 19  ALT 16 19  ALKPHOS 147* 142*  BILITOT 1.3* 0.9  PROT 5.4* 5.4*  ALBUMIN 2.0* 2.1*    CBG: Recent Labs  Lab 08/06/20 0830 08/06/20 1131 08/06/20 1603 08/06/20 2242 08/07/20 0811  GLUCAP 98 122* 102* 153* 118*     Recent Results (from the past 240 hour(s))  Culture, blood (single)     Status: Abnormal   Collection Time: 07/31/20  5:26 PM   Specimen: BLOOD  Result Value Ref Range Status   Specimen Description   Final    BLOOD LEFT ANTECUBITAL Performed at [facility], [address], [place], [place] [address]    Special Requests   Final    BOTTLES DRAWN AEROBIC AND ANAEROBIC Blood Culture adequate volume Performed at [facility], [address], [place], [place] [address]    Culture  Setup Time   Final    GRAM NEGATIVE RODS AEROBIC AND ANEROBIC BOTTLES Gram Stain Report Called to,Read Back By and Verified With: LARIMORE, A. 12/24 @ 0740 BEARD, S.  CRITICAL RESULT CALLED TO, READ BACK BY AND VERIFIED WITH: PHARMD  Lucita Lora [id number] 1921 FCP Performed at [facility], [address], [place], [place] [address]    Culture ESCHERICHIA COLI (A)  Final   Report Status 08/03/2020 FINAL  Final   Organism ID, Bacteria ESCHERICHIA COLI  Final      Susceptibility   Escherichia coli - MIC*    AMPICILLIN <=2 SENSITIVE Sensitive     CEFAZOLIN <=4 SENSITIVE Sensitive     CEFEPIME <=0.12 SENSITIVE Sensitive     CEFTAZIDIME <=1 SENSITIVE Sensitive     CEFTRIAXONE <=0.25 SENSITIVE Sensitive     CIPROFLOXACIN <=0.25 SENSITIVE Sensitive     GENTAMICIN <=1 SENSITIVE Sensitive     IMIPENEM <=0.25 SENSITIVE Sensitive     TRIMETH/SULFA <=20 SENSITIVE Sensitive     AMPICILLIN/SULBACTAM <=2 SENSITIVE Sensitive     PIP/TAZO <=4 SENSITIVE Sensitive     * ESCHERICHIA COLI  Blood Culture ID Panel (Reflexed)     Status: Abnormal   Collection Time: 07/31/20  5:26 PM  Result Value Ref Range  Status   Enterococcus faecalis NOT DETECTED NOT DETECTED Final   Enterococcus Faecium NOT DETECTED NOT DETECTED Final   Listeria monocytogenes NOT DETECTED NOT DETECTED Final   Staphylococcus species NOT DETECTED NOT DETECTED Final   Staphylococcus aureus (BCID) NOT DETECTED NOT DETECTED Final   Staphylococcus epidermidis NOT DETECTED NOT DETECTED Final   Staphylococcus lugdunensis NOT DETECTED NOT DETECTED Final   Streptococcus species NOT DETECTED NOT DETECTED Final   Streptococcus agalactiae NOT DETECTED NOT DETECTED Final   Streptococcus pneumoniae NOT DETECTED NOT DETECTED Final   Streptococcus pyogenes NOT DETECTED NOT DETECTED Final   A.calcoaceticus-baumannii NOT DETECTED NOT DETECTED Final   Bacteroides fragilis NOT DETECTED NOT DETECTED Final   Enterobacterales DETECTED (A) NOT DETECTED Final    Comment: Enterobacterales represent a large order of gram negative bacteria, not a single organism. CRITICAL RESULT CALLED TO, READ BACK BY AND VERIFIED WITH: PHARMD  TERRY G. [id number] 1921 FCP    Enterobacter cloacae complex NOT DETECTED NOT DETECTED Final   Escherichia coli DETECTED (A) NOT DETECTED Final    Comment: CRITICAL RESULT CALLED TO, READ BACK BY AND VERIFIED WITH: PHARMD  TERRY G. [id number] 1921 FCP    Klebsiella aerogenes NOT DETECTED NOT DETECTED Final   Klebsiella oxytoca NOT DETECTED NOT DETECTED Final  Klebsiella pneumoniae NOT DETECTED NOT DETECTED Final   Proteus species NOT DETECTED NOT DETECTED Final   Salmonella species NOT DETECTED NOT DETECTED Final   Serratia marcescens NOT DETECTED NOT DETECTED Final   Haemophilus influenzae NOT DETECTED NOT DETECTED Final   Neisseria meningitidis NOT DETECTED NOT DETECTED Final   Pseudomonas aeruginosa NOT DETECTED NOT DETECTED Final   Stenotrophomonas maltophilia NOT DETECTED NOT DETECTED Final   Candida albicans NOT DETECTED NOT DETECTED Final   Candida auris NOT DETECTED NOT DETECTED Final   Candida glabrata NOT  DETECTED NOT DETECTED Final   Candida krusei NOT DETECTED NOT DETECTED Final   Candida parapsilosis NOT DETECTED NOT DETECTED Final   Candida tropicalis NOT DETECTED NOT DETECTED Final   Cryptococcus neoformans/gattii NOT DETECTED NOT DETECTED Final   CTX-M ESBL NOT DETECTED NOT DETECTED Final   Carbapenem resistance IMP NOT DETECTED NOT DETECTED Final   Carbapenem resistance KPC NOT DETECTED NOT DETECTED Final   Carbapenem resistance NDM NOT DETECTED NOT DETECTED Final   Carbapenem resist OXA 48 LIKE NOT DETECTED NOT DETECTED Final   Carbapenem resistance VIM NOT DETECTED NOT DETECTED Final    Comment: Performed at [facility], [address] [place], [place] [address]  Resp Panel by RT-PCR (Flu A&B, Covid) Nasopharyngeal Swab     Status: None   Collection Time: 07/31/20  5:39 PM   Specimen: Nasopharyngeal Swab; Nasopharyngeal(NP) swabs in vial transport medium  Result Value Ref Range Status   SARS Coronavirus 2 by RT PCR NEGATIVE NEGATIVE Final    Comment: (NOTE) SARS-CoV-2 target nucleic acids are NOT DETECTED.  The SARS-CoV-2 RNA is generally detectable in upper respiratory specimens during the acute phase of infection. The lowest concentration of SARS-CoV-2 viral copies this assay can detect is 138 copies/mL. A negative result does not preclude SARS-Cov-2 infection and should not be used as the sole basis for treatment or other patient management decisions. A negative result may occur with  improper specimen collection/handling, submission of specimen other than nasopharyngeal swab, presence of viral mutation(s) within the areas targeted by this assay, and inadequate number of viral copies(<138 copies/mL). A negative result must be combined with clinical observations, patient history, and epidemiological information. The expected result is Negative.  Fact Sheet for Patients:  BloggerCourse.comhttps://www.fda.gov/media/152166/download  Fact Sheet for Healthcare Providers:   SeriousBroker.ithttps://www.fda.gov/media/152162/download  This test is no t yet approved or cleared by the [place] FDA and  has been authorized for detection and/or diagnosis of SARS-CoV-2 by FDA under an Emergency Use Authorization (EUA). This EUA will remain  in effect (meaning this test can be used) for the duration of the COVID-19 declaration under Section 564(b)(1) of the Act, 21 U.S.C.section 360bbb-3(b)(1), unless the authorization is terminated  or revoked sooner.       Influenza A by PCR NEGATIVE NEGATIVE Final   Influenza B by PCR NEGATIVE NEGATIVE Final    Comment: (NOTE) The Xpert Xpress SARS-CoV-2/FLU/RSV plus assay is intended as an aid in the diagnosis of influenza from Nasopharyngeal swab specimens and should not be used as a sole basis for treatment. Nasal washings and aspirates are unacceptable for Xpert Xpress SARS-CoV-2/FLU/RSV testing.  Fact Sheet for Patients: BloggerCourse.comhttps://www.fda.gov/media/152166/download  Fact Sheet for Healthcare Providers: SeriousBroker.ithttps://www.fda.gov/media/152162/download  This test is not yet approved or cleared by the [place] FDA and has been authorized for detection and/or diagnosis of SARS-CoV-2 by FDA under an Emergency Use Authorization (EUA). This EUA will remain in effect (meaning this test can be used) for  the duration of the COVID-19 declaration under Section 564(b)(1) of the Act, 21 U.S.C. section 360bbb-3(b)(1), unless the authorization is terminated or revoked.  Performed at [facility], [address], [place], [place] [address]   Culture, blood (single)     Status: None   Collection Time: 07/31/20  8:23 PM   Specimen: BLOOD RIGHT WRIST  Result Value Ref Range Status   Specimen Description BLOOD RIGHT WRIST  Final   Special Requests   Final    BOTTLES DRAWN AEROBIC AND ANAEROBIC Blood Culture results may not be optimal due to an excessive volume of blood received in culture bottles   Culture   Final    NO GROWTH 5 DAYS Performed  at [facility], [address] [place], [place] [address]    Report Status 08/05/2020 FINAL  Final  Urine Culture     Status: Abnormal   Collection Time: 08/01/20 11:28 AM   Specimen: Urine, Clean Catch  Result Value Ref Range Status   Specimen Description   Final    URINE, CLEAN CATCH Performed at [facility], [address], [place], [place] [address]    Special Requests   Final    NONE Performed at [facility], [address], [place], [place] [address]    Culture (A)  Final    <10,000 COLONIES/mL INSIGNIFICANT GROWTH Performed at [facility], [address], [place], [place] [address]    Report Status 08/02/2020 FINAL  Final  MRSA PCR Screening     Status: None   Collection Time: 08/01/20  3:26 PM   Specimen: Nasopharyngeal  Result Value Ref Range Status   MRSA by PCR NEGATIVE NEGATIVE Final    Comment:        The GeneXpert MRSA Assay (FDA approved for NASAL specimens only), is one component of a comprehensive MRSA colonization surveillance program. It is not intended to diagnose MRSA infection nor to guide or monitor treatment for MRSA infections. Performed at [facility], [address] [place], [place] [address]   Aerobic/Anaerobic Culture (surgical/deep wound)     Status: None   Collection Time: 08/01/20  7:37 PM   Specimen: Urine, Catheterized  Result Value Ref Range Status   Specimen Description   Final    URINE, CATHETERIZED URINE FROM DRAIN Performed at [facility], [address] [place], [place] [address]    Special Requests   Final    NONE Performed at [facility], [address], [place], [place] [address]    Gram Stain   Final    ABUNDANT WBC PRESENT, PREDOMINANTLY PMN RARE GRAM NEGATIVE RODS RARE GRAM POSITIVE RODS    Culture   Final    FEW ESCHERICHIA COLI NO ANAEROBES ISOLATED Performed at [facility], [address] [place], [place] [address]    Report Status  08/07/2020 FINAL  Final   Organism ID, Bacteria ESCHERICHIA COLI  Final      Susceptibility   Escherichia coli - MIC*    AMPICILLIN 4 SENSITIVE Sensitive     CEFAZOLIN <=4 SENSITIVE Sensitive     CEFEPIME <=0.12 SENSITIVE Sensitive     CEFTRIAXONE <=0.25 SENSITIVE Sensitive     CIPROFLOXACIN <=0.25 SENSITIVE Sensitive     GENTAMICIN <=1 SENSITIVE Sensitive     IMIPENEM <=0.25 SENSITIVE Sensitive     NITROFURANTOIN <=16 SENSITIVE Sensitive     TRIMETH/SULFA <=20 SENSITIVE Sensitive     AMPICILLIN/SULBACTAM <=2 SENSITIVE Sensitive  PIP/TAZO <=4 SENSITIVE Sensitive     * FEW ESCHERICHIA COLI  Aerobic/Anaerobic Culture (surgical/deep wound)     Status: None   Collection Time: 08/01/20  7:38 PM   Specimen: Abscess  Result Value Ref Range Status   Specimen Description   Final    ABSCESS Performed at [facility], [address] [place], [place] [address]    Special Requests   Final    NONE Performed at [facility], [address] [place], [place] [address]    Gram Stain   Final    ABUNDANT WBC PRESENT, PREDOMINANTLY PMN MODERATE GRAM NEGATIVE RODS    Culture   Final    ABUNDANT ESCHERICHIA COLI NO ANAEROBES ISOLATED Performed at [facility], [address], [place], [place] [address]    Report Status 08/07/2020 FINAL  Final   Organism ID, Bacteria ESCHERICHIA COLI  Final      Susceptibility   Escherichia coli - MIC*    AMPICILLIN <=2 SENSITIVE Sensitive     CEFAZOLIN <=4 SENSITIVE Sensitive     CEFEPIME <=0.12 SENSITIVE Sensitive     CEFTAZIDIME <=1 SENSITIVE Sensitive     CEFTRIAXONE <=0.25 SENSITIVE Sensitive     CIPROFLOXACIN <=0.25 SENSITIVE Sensitive     GENTAMICIN <=1 SENSITIVE Sensitive     IMIPENEM <=0.25 SENSITIVE Sensitive     TRIMETH/SULFA <=20 SENSITIVE Sensitive     AMPICILLIN/SULBACTAM <=2 SENSITIVE Sensitive     PIP/TAZO <=4 SENSITIVE Sensitive     * ABUNDANT ESCHERICHIA COLI         Radiology  Studies: No results found.      Scheduled Meds: . chlorhexidine  15 mL Mouth Rinse BID  . Chlorhexidine Gluconate Cloth  6 each Topical Daily  . feeding supplement (GLUCERNA SHAKE)  237 mL Oral BID BM  . heparin injection (subcutaneous)  5,000 Units Subcutaneous Q8H  . insulin aspart  0-20 Units Subcutaneous TID WC  . insulin aspart  0-5 Units Subcutaneous QHS  . insulin glargine  8 Units Subcutaneous BID  . mouth rinse  15 mL Mouth Rinse q12n4p  . potassium chloride  40 mEq Oral Q4H  . sodium chloride flush  5 mL Intracatheter Q8H   Continuous Infusions: .  ceFAZolin (ANCEF) IV 2 g (08/07/20 0630)  . lactated ringers 50 mL/hr at 08/07/20 0624  . magnesium sulfate bolus IVPB    . sodium chloride       LOS: 7 days    Time spent: 40 minutes    Ramiro Harvest, MD [facility]   To contact the attending provider between 7A-7P or the covering provider during after hours 7P-7A, please log into the web site [email or web address] and access using universal [place] password for that web site. If you do not have the password, please call the hospital operator.  08/07/2020, 11:17 AM

## 2020-08-07 NOTE — Evaluation (Signed)
Physical Therapy Evaluation Patient Details Name: Tammie [name] MRN: [id number] DOB: Aug 03, 1961 Today's Date: 08/07/2020   History of Present Illness  59 year old female admitted 12/24 with chief complaint of abdominal pain.  Found to have acute left pyelonephrosis with abscess, severe sepsis, acute kidney injury, hyper osmolar nonketotic hyperglycemia, severe hyponatremia   -CT imaging showed multiple pulmonary nodules, imaging of abdomen pelvis With left hydronephrosis and evidence of perinephritic abscess.  Pt s/p placement of left nephrostomy tube and left perirenal drain both placed 12/24.  Clinical Impression  Pt admitted with above diagnosis. Pt currently with functional limitations due to the deficits listed below (see PT Problem List). Pt will benefit from skilled PT to increase their independence and safety with mobility to allow discharge to the venue listed below.  Pt and spouse report pt has not been OOB in days.  Pt required a little encouragement to mobilize however agreeable to at least OOB and transfer to recliner.  Pt would benefit from ambulating in hallway with nursing during acute stay.     Follow Up Recommendations Home health PT;Supervision for mobility/OOB    Equipment Recommendations  Rolling walker with 5" wheels    Recommendations for Other Services       Precautions / Restrictions Precautions Precautions: Fall Precaution Comments: 2 left posterior PCN drains      Mobility  Bed Mobility Overal bed mobility: Needs Assistance Bed Mobility: Rolling;Sidelying to Sit     Supine to sit: Mod assist;HOB elevated     General bed mobility comments: assist for trunk due to pain    Transfers Overall transfer level: Needs assistance Equipment used: Rolling walker (2 wheeled) Transfers: Sit to/from [employer] Sit to Stand: Mod assist;+2 safety/equipment Stand pivot transfers: Min assist;+2 safety/equipment       General transfer comment:  verbal cues for hand placement and weight shifting; spouse also assisted and encouraged pt, pt able to take a few steps over to recliner  Ambulation/Gait             General Gait Details: pt declines today  Stairs            Wheelchair Mobility    Modified Rankin (Stroke Patients Only)       Balance                                             Pertinent Vitals/Pain Pain Assessment: No/denies pain    Home Living Family/patient expects to be discharged to:: Private residence Living Arrangements: Spouse/significant other;Children   Type of Home: House Home Access: Stairs to enter Entrance Stairs-Rails: Right Entrance Stairs-Number of Steps: 2 Home Layout: One level Home Equipment: None      Prior Function Level of Independence: Independent               Hand Dominance        Extremity/Trunk Assessment        Lower Extremity Assessment Lower Extremity Assessment: Generalized weakness    Cervical / Trunk Assessment Cervical / Trunk Assessment: Normal  Communication   Communication: No difficulties  Cognition Arousal/Alertness: Awake/alert Behavior During Therapy: Flat affect Overall Cognitive Status: Within Functional Limits for tasks assessed  General Comments      Exercises     Assessment/Plan    PT Assessment Patient needs continued PT services  PT Problem List Decreased activity tolerance;Decreased strength;Decreased mobility;Decreased knowledge of use of DME       PT Treatment Interventions DME instruction;Gait training;Balance training;Therapeutic exercise;Functional mobility training;Therapeutic activities;Patient/family education    PT Goals (Current goals can be found in the Care Plan section)  Acute Rehab PT Goals PT Goal Formulation: With patient Time For Goal Achievement: 08/21/20 Potential to Achieve Goals: Good    Frequency Min 3X/week    Barriers to discharge        Co-evaluation               AM-PAC PT "6 Clicks" Mobility  Outcome Measure Help needed turning from your back to your side while in a flat bed without using bedrails?: A Little Help needed moving from lying on your back to sitting on the side of a flat bed without using bedrails?: A Lot Help needed moving to and from a bed to a chair (including a wheelchair)?: A Lot Help needed standing up from a chair using your arms (e.g., wheelchair or bedside chair)?: A Lot Help needed to walk in hospital room?: A Little Help needed climbing 3-5 steps with a railing? : A Lot 6 Click Score: 14    End of Session Equipment Utilized During Treatment: Gait belt Activity Tolerance: Patient tolerated treatment well Patient left: in chair;with call bell/phone within reach;with chair alarm set;with family/visitor present Nurse Communication: Mobility status PT Visit Diagnosis: Difficulty in walking, not elsewhere classified (R26.2)    Time: 3419-6222 PT Time Calculation (min) (ACUTE ONLY): 19 min   Charges:   PT Evaluation $PT Eval Low Complexity: 1 Low         Kati PT, DPT Acute Rehabilitation Services Pager: [phone] Office: [phone]  Maida Sale E 08/07/2020, 12:05 PM

## 2020-08-07 NOTE — Progress Notes (Signed)
Nutrition Follow-up  INTERVENTION:   -Glucerna Shake po BID, each supplement provides 220 kcal and 10 grams of protein  NUTRITION DIAGNOSIS:   Food and nutrition related knowledge deficit related to limited prior education as evidenced by per patient/family report (New DM).  Completed  ASSESSMENT:   Patient is an obese 59 yo female with history of poor medical follow up. She presents complaining of abdominal pain and decreased oral intake. Multifocal pneumonia, hyperosmolar hyperglycemia, AKI, Hyponatremia, Hypokalemia,  Hyperphosphatemia, hypomagnesemia and hypertension.  12/23: admitted 12/24: RD at [facility] provided DM diet education, transferred to [name] ICU, s/p L nephrostomy placement, L pelvic abscess drain placement  Patient currently consuming 50-85% of meals at this time. Ordered Glucerna shakes given variable intakes and stage 2 sacral pressure injury.   Admission weight: 178 lbs Current weight: 177 lbs.  Per nursing documentation, pt with moderate BLE edema. Mild generalized edema.  Medications: Lactated ringers, IV Mg sulfate, IV KCl  Labs reviewed: CBGs: 122-240 Low Mg  Diet Order:   Diet Order            Diet NPO time specified Except for: Ice Chips, Sips with Meds  Diet effective now                 EDUCATION NEEDS:   Education needs have been addressed  Skin:  Skin Assessment: Skin Integrity Issues: Skin Integrity Issues:: Stage II Stage II: mid sacrum  Last BM:  12/30  Height:   Ht Readings from Last 1 Encounters:  08/01/20 5\' 2"  (1.575 m)    Weight:   Wt Readings from Last 1 Encounters:  08/07/20 80.6 kg    BMI:  Body mass index is 32.5 kg/m.  Estimated Nutritional Needs:   Kcal:  08/09/20  Protein:  85-90gr  Fluid:  1.5-1.6 ltiers daily  [id number], MS, RD, LDN Inpatient Clinical Dietitian Contact information available via Amion

## 2020-08-07 NOTE — Progress Notes (Signed)
Urology Inpatient Progress Report         Intv/Subj: Patient transferred to the floor last night.  Sitting in bed looking at phone today.  Conversant today and has been OOB to chair today.    Principal Problem:   Multifocal pneumonia Active Problems:   Hyperosmolar hyperglycemic state (HHS) (HCC)   Hyponatremia   Hypertension   Acute renal failure (HCC)   Hypokalemia   Severe protein-calorie malnutrition (HCC)   Class 1 obesity   Leukocytosis   Hypomagnesemia   Hyperphosphatemia   Abdominal distention   Lung nodules   Delirium  Current Facility-Administered Medications  Medication Dose Route Frequency Provider Last Rate Last Admin  . albuterol (PROVENTIL) (2.5 MG/3ML) 0.083% nebulizer solution 2.5 mg  2.5 mg Nebulization Q4H PRN Simonne Martinet, NP      . ceFAZolin (ANCEF) IVPB 2g/100 mL premix  2 g Intravenous Q8H Simonne Martinet, NP 200 mL/hr at 08/07/20 0630 2 g at 08/07/20 0630  . chlorhexidine (PERIDEX) 0.12 % solution 15 mL  15 mL Mouth Rinse BID Simonne Martinet, NP   15 mL at 08/06/20 2252  . Chlorhexidine Gluconate Cloth 2 % PADS 6 each  6 each Topical Daily Simonne Martinet, NP   6 each at 08/06/20 0800  . dextrose 50 % solution 0-50 mL  0-50 mL Intravenous PRN Simonne Martinet, NP      . heparin injection 5,000 Units  5,000 Units Subcutaneous Q8H Simonne Martinet, NP   5,000 Units at 08/07/20 [phone]  . insulin aspart (novoLOG) injection 0-20 Units  0-20 Units Subcutaneous TID WC Simonne Martinet, NP   3 Units at 08/06/20 1232  . insulin aspart (novoLOG) injection 0-5 Units  0-5 Units Subcutaneous QHS Simonne Martinet, NP      . insulin glargine (LANTUS) injection 10 Units  10 Units Subcutaneous BID Simonne Martinet, NP   10 Units at 08/06/20 2257  . lactated ringers infusion   Intravenous Continuous Simonne Martinet, NP 50 mL/hr at 08/07/20 7209 New Bag at 08/07/20 4709  . MEDLINE mouth rinse  15 mL Mouth Rinse q12n4p Simonne Martinet, NP   15 mL at 08/06/20 1645   . ondansetron (ZOFRAN) tablet 4 mg  4 mg Oral Q6H PRN Simonne Martinet, NP       Or  . ondansetron [facility]) injection 4 mg  4 mg Intravenous Q6H PRN Simonne Martinet, NP      . sodium chloride 0.9 % bolus 1,588 mL  20 mL/kg Intravenous Once Simonne Martinet, NP      . sodium chloride flush (NS) 0.9 % injection 5 mL  5 mL Intracatheter Q8H Simonne Martinet, NP   5 mL at 08/07/20 0618     Objective: Vital: Vitals:   08/06/20 1941 08/06/20 2043 08/06/20 2145 08/07/20 0555  BP: 127/73 135/69 (!) 128/58 131/71  Pulse: 88 87 91 88  Resp: 18 18 18 18   Temp: 98.2 F (36.8 C) 98.3 F (36.8 C) 98.2 F (36.8 C) 98 F (36.7 C)  TempSrc: Oral Oral Oral Oral  SpO2: 90% 94% 94%   Weight:      Height:       I/Os: I/O last 3 completed shifts: In: 1848.9 [P.O.:600; I.V.:884.6; Other:10; IV Piggyback:354.3] Out: 3715 [Urine:3510; Drains:205]  Physical Exam:  General: Patient is in no apparent distress Lungs: Normal respiratory effort, chest expands symmetrically. GI:  The abdomen is soft and mildly distended GU: L PCN  drain - yellow urine with debris        L perinephric drain - bloody output with pus Foley: draining clear yellow urine Ext: lower extremities symmetric  Lab Results: Recent Labs    08/05/20 0750 08/06/20 0220 08/07/20 0506  WBC 16.0* 15.5* 15.2*  HGB 10.4* 9.6* 10.6*  HCT 30.5* 29.6* 33.0*   Recent Labs    08/05/20 0750 08/06/20 0220 08/07/20 0506  NA 133* 135 135  K 3.1* 3.8 3.2*  CL 89* 94* 94*  CO2 GLUCOSE 146* 87 113*  BUN 74* 56* 41*  CREATININE 1.84* 1.61* 1.27*  CALCIUM 8.1* 8.1* 8.3*   No results for input(s): LABPT, INR in the last 72 hours. No results for input(s): LABURIN in the last 72 hours. Results for orders placed or performed during the hospital encounter of 07/31/20  Culture, blood (single)     Status: Abnormal   Collection Time: 07/31/20  5:26 PM   Specimen: BLOOD  Result Value Ref Range Status   Specimen Description    Final    BLOOD LEFT ANTECUBITAL Performed at [facility], [address] [place], [place] [address]    Special Requests   Final    BOTTLES DRAWN AEROBIC AND ANAEROBIC Blood Culture adequate volume Performed at [facility], [address] [place], [place] [address]    Culture  Setup Time   Final    GRAM NEGATIVE RODS AEROBIC AND ANEROBIC BOTTLES Gram Stain Report Called to,Read Back By and Verified With: LARIMORE, A. 12/24 @ 0740 BEARD, S.  CRITICAL RESULT CALLED TO, READ BACK BY AND VERIFIED WITH: PHARMD  Lucita Lora [id number] 1921 FCP Performed at [facility], [address], [place], [place] [address]    Culture ESCHERICHIA COLI (A)  Final   Report Status 08/03/2020 FINAL  Final   Organism ID, Bacteria ESCHERICHIA COLI  Final      Susceptibility   Escherichia coli - MIC*    AMPICILLIN <=2 SENSITIVE Sensitive     CEFAZOLIN <=4 SENSITIVE Sensitive     CEFEPIME <=0.12 SENSITIVE Sensitive     CEFTAZIDIME <=1 SENSITIVE Sensitive     CEFTRIAXONE <=0.25 SENSITIVE Sensitive     CIPROFLOXACIN <=0.25 SENSITIVE Sensitive     GENTAMICIN <=1 SENSITIVE Sensitive     IMIPENEM <=0.25 SENSITIVE Sensitive     TRIMETH/SULFA <=20 SENSITIVE Sensitive     AMPICILLIN/SULBACTAM <=2 SENSITIVE Sensitive     PIP/TAZO <=4 SENSITIVE Sensitive     * ESCHERICHIA COLI  Blood Culture ID Panel (Reflexed)     Status: Abnormal   Collection Time: 07/31/20  5:26 PM  Result Value Ref Range Status   Enterococcus faecalis NOT DETECTED NOT DETECTED Final   Enterococcus Faecium NOT DETECTED NOT DETECTED Final   Listeria monocytogenes NOT DETECTED NOT DETECTED Final   Staphylococcus species NOT DETECTED NOT DETECTED Final   Staphylococcus aureus (BCID) NOT DETECTED NOT DETECTED Final   Staphylococcus epidermidis NOT DETECTED NOT DETECTED Final   Staphylococcus lugdunensis NOT DETECTED NOT DETECTED Final   Streptococcus species NOT DETECTED NOT DETECTED Final   Streptococcus agalactiae NOT DETECTED NOT DETECTED  Final   Streptococcus pneumoniae NOT DETECTED NOT DETECTED Final   Streptococcus pyogenes NOT DETECTED NOT DETECTED Final   A.calcoaceticus-baumannii NOT DETECTED NOT DETECTED Final   Bacteroides fragilis NOT DETECTED NOT DETECTED Final   Enterobacterales DETECTED (A) NOT DETECTED Final    Comment: Enterobacterales represent a large order of gram negative bacteria, not a single organism.  CRITICAL RESULT CALLED TO, READ BACK BY AND VERIFIED WITH: PHARMD  TERRY G. [id number] 1921 FCP    Enterobacter cloacae complex NOT DETECTED NOT DETECTED Final   Escherichia coli DETECTED (A) NOT DETECTED Final    Comment: CRITICAL RESULT CALLED TO, READ BACK BY AND VERIFIED WITH: PHARMD  TERRY G. [id number] 1921 FCP    Klebsiella aerogenes NOT DETECTED NOT DETECTED Final   Klebsiella oxytoca NOT DETECTED NOT DETECTED Final   Klebsiella pneumoniae NOT DETECTED NOT DETECTED Final   Proteus species NOT DETECTED NOT DETECTED Final   Salmonella species NOT DETECTED NOT DETECTED Final   Serratia marcescens NOT DETECTED NOT DETECTED Final   Haemophilus influenzae NOT DETECTED NOT DETECTED Final   Neisseria meningitidis NOT DETECTED NOT DETECTED Final   Pseudomonas aeruginosa NOT DETECTED NOT DETECTED Final   Stenotrophomonas maltophilia NOT DETECTED NOT DETECTED Final   Candida albicans NOT DETECTED NOT DETECTED Final   Candida auris NOT DETECTED NOT DETECTED Final   Candida glabrata NOT DETECTED NOT DETECTED Final   Candida krusei NOT DETECTED NOT DETECTED Final   Candida parapsilosis NOT DETECTED NOT DETECTED Final   Candida tropicalis NOT DETECTED NOT DETECTED Final   Cryptococcus neoformans/gattii NOT DETECTED NOT DETECTED Final   CTX-M ESBL NOT DETECTED NOT DETECTED Final   Carbapenem resistance IMP NOT DETECTED NOT DETECTED Final   Carbapenem resistance KPC NOT DETECTED NOT DETECTED Final   Carbapenem resistance NDM NOT DETECTED NOT DETECTED Final   Carbapenem resist OXA 48 LIKE NOT DETECTED NOT DETECTED  Final   Carbapenem resistance VIM NOT DETECTED NOT DETECTED Final    Comment: Performed at [facility], [address] [place], [place] [address]  Resp Panel by RT-PCR (Flu A&B, Covid) Nasopharyngeal Swab     Status: None   Collection Time: 07/31/20  5:39 PM   Specimen: Nasopharyngeal Swab; Nasopharyngeal(NP) swabs in vial transport medium  Result Value Ref Range Status   SARS Coronavirus 2 by RT PCR NEGATIVE NEGATIVE Final    Comment: (NOTE) SARS-CoV-2 target nucleic acids are NOT DETECTED.  The SARS-CoV-2 RNA is generally detectable in upper respiratory specimens during the acute phase of infection. The lowest concentration of SARS-CoV-2 viral copies this assay can detect is 138 copies/mL. A negative result does not preclude SARS-Cov-2 infection and should not be used as the sole basis for treatment or other patient management decisions. A negative result may occur with  improper specimen collection/handling, submission of specimen other than nasopharyngeal swab, presence of viral mutation(s) within the areas targeted by this assay, and inadequate number of viral copies(<138 copies/mL). A negative result must be combined with clinical observations, patient history, and epidemiological information. The expected result is Negative.  Fact Sheet for Patients:  BloggerCourse.com  Fact Sheet for Healthcare Providers:  SeriousBroker.it  This test is no t yet approved or cleared by the [place] FDA and  has been authorized for detection and/or diagnosis of SARS-CoV-2 by FDA under an Emergency Use Authorization (EUA). This EUA will remain  in effect (meaning this test can be used) for the duration of the COVID-19 declaration under Section 564(b)(1) of the Act, 21 U.S.C.section 360bbb-3(b)(1), unless the authorization is terminated  or revoked sooner.       Influenza A by PCR NEGATIVE NEGATIVE Final   Influenza B by PCR  NEGATIVE NEGATIVE Final    Comment: (NOTE) The Xpert Xpress SARS-CoV-2/FLU/RSV plus assay is intended as an aid in the diagnosis of influenza from Nasopharyngeal swab specimens and  should not be used as a sole basis for treatment. Nasal washings and aspirates are unacceptable for Xpert Xpress SARS-CoV-2/FLU/RSV testing.  Fact Sheet for Patients: BloggerCourse.comhttps://www.fda.gov/media/152166/download  Fact Sheet for Healthcare Providers: SeriousBroker.ithttps://www.fda.gov/media/152162/download  This test is not yet approved or cleared by the [place] FDA and has been authorized for detection and/or diagnosis of SARS-CoV-2 by FDA under an Emergency Use Authorization (EUA). This EUA will remain in effect (meaning this test can be used) for the duration of the COVID-19 declaration under Section 564(b)(1) of the Act, 21 U.S.C. section 360bbb-3(b)(1), unless the authorization is terminated or revoked.  Performed at [facility], [address] [place], [place] [address]   Culture, blood (single)     Status: None   Collection Time: 07/31/20  8:23 PM   Specimen: BLOOD RIGHT WRIST  Result Value Ref Range Status   Specimen Description BLOOD RIGHT WRIST  Final   Special Requests   Final    BOTTLES DRAWN AEROBIC AND ANAEROBIC Blood Culture results may not be optimal due to an excessive volume of blood received in culture bottles   Culture   Final    NO GROWTH 5 DAYS Performed at [facility], [address] [place], [place] [address]    Report Status 08/05/2020 FINAL  Final  Urine Culture     Status: Abnormal   Collection Time: 08/01/20 11:28 AM   Specimen: Urine, Clean Catch  Result Value Ref Range Status   Specimen Description   Final    URINE, CLEAN CATCH Performed at [facility], [address] [place], [place] [address]    Special Requests   Final    NONE Performed at [facility], [address] [place], [place] [address]    Culture (A)  Final    <10,000 COLONIES/mL INSIGNIFICANT  GROWTH Performed at [facility], [address] [place], [place] [address]    Report Status 08/02/2020 FINAL  Final  MRSA PCR Screening     Status: None   Collection Time: 08/01/20  3:26 PM   Specimen: Nasopharyngeal  Result Value Ref Range Status   MRSA by PCR NEGATIVE NEGATIVE Final    Comment:        The GeneXpert MRSA Assay (FDA approved for NASAL specimens only), is one component of a comprehensive MRSA colonization surveillance program. It is not intended to diagnose MRSA infection nor to guide or monitor treatment for MRSA infections. Performed at [facility], [address] [place], [place] [address]   Aerobic/Anaerobic Culture (surgical/deep wound)     Status: None (Preliminary result)   Collection Time: 08/01/20  7:37 PM   Specimen: Urine, Catheterized  Result Value Ref Range Status   Specimen Description   Final    URINE, CATHETERIZED URINE FROM DRAIN Performed at [facility], [address] [place], [place] [address]    Special Requests   Final    NONE Performed at [facility], [address] [place], [place] [address]    Gram Stain   Final    ABUNDANT WBC PRESENT, PREDOMINANTLY PMN RARE GRAM NEGATIVE RODS RARE GRAM POSITIVE RODS Performed at [facility], [address] [place], [place] [address]    Culture   Final    FEW ESCHERICHIA COLI NO ANAEROBES ISOLATED; CULTURE IN PROGRESS FOR 5 DAYS    Report Status PENDING  Incomplete   Organism ID, Bacteria ESCHERICHIA COLI  Final      Susceptibility   Escherichia  coli - MIC*    AMPICILLIN 4 SENSITIVE Sensitive     CEFAZOLIN <=4 SENSITIVE Sensitive     CEFEPIME <=0.12 SENSITIVE Sensitive     CEFTRIAXONE <=0.25 SENSITIVE Sensitive     CIPROFLOXACIN <=0.25 SENSITIVE Sensitive     GENTAMICIN <=1 SENSITIVE Sensitive     IMIPENEM <=0.25 SENSITIVE Sensitive     NITROFURANTOIN <=16 SENSITIVE Sensitive     TRIMETH/SULFA <=20 SENSITIVE  Sensitive     AMPICILLIN/SULBACTAM <=2 SENSITIVE Sensitive     PIP/TAZO <=4 SENSITIVE Sensitive     * FEW ESCHERICHIA COLI  Aerobic/Anaerobic Culture (surgical/deep wound)     Status: None (Preliminary result)   Collection Time: 08/01/20  7:38 PM   Specimen: Abscess  Result Value Ref Range Status   Specimen Description   Final    ABSCESS Performed at [facility], [address], [place], [place] [address]    Special Requests   Final    NONE Performed at [facility], [address], [place], [place] [address]    Gram Stain   Final    ABUNDANT WBC PRESENT, PREDOMINANTLY PMN MODERATE GRAM NEGATIVE RODS Performed at [facility], [address], [place], [place] [address]    Culture   Final    ABUNDANT ESCHERICHIA COLI NO ANAEROBES ISOLATED; CULTURE IN PROGRESS FOR 5 DAYS    Report Status PENDING  Incomplete   Organism ID, Bacteria ESCHERICHIA COLI  Final      Susceptibility   Escherichia coli - MIC*    AMPICILLIN <=2 SENSITIVE Sensitive     CEFAZOLIN <=4 SENSITIVE Sensitive     CEFEPIME <=0.12 SENSITIVE Sensitive     CEFTAZIDIME <=1 SENSITIVE Sensitive     CEFTRIAXONE <=0.25 SENSITIVE Sensitive     CIPROFLOXACIN <=0.25 SENSITIVE Sensitive     GENTAMICIN <=1 SENSITIVE Sensitive     IMIPENEM <=0.25 SENSITIVE Sensitive     TRIMETH/SULFA <=20 SENSITIVE Sensitive     AMPICILLIN/SULBACTAM <=2 SENSITIVE Sensitive     PIP/TAZO <=4 SENSITIVE Sensitive     * ABUNDANT ESCHERICHIA COLI    Studies/Results: No results found.  Assessment: 59 year old woman with severeleft emphysematous pyelonephritis,4 mm left UPJcalculus and clinicalsigns ofseveresepsis with admission labsWBC 44, hyponatremia,and glucose 706now clinically improving.  1. Emphysematous pyelonephritis: -antibiotics have been narrowed according to urine/bloodcultures (pan sensitive E coli) -recommend foley for maximum decompression of infected urine -Continue  LEFT perirenal drain and LEFT PCN tube as both continue to put out purulent drainage -greatly appreciate IR evaluation and treatment -would like to reimage left kidney at some point once creatinine stabilizesideally with contrast (early next week vs the weekend)   2. UPJ calculus with mild obstructive uropathy and AKI: -Creatinine continues to improve with Left PCN drain -will continue to trend Cr and plan for reimaging in future -repeat imaging will reassess if UPJ calculus has moved at all   Kasandra Knudsen, MD Urology 08/07/2020, 7:19 AM

## 2020-08-08 ENCOUNTER — Inpatient Hospital Stay (HOSPITAL_COMMUNITY): Payer: Self-pay

## 2020-08-08 DIAGNOSIS — N10 Acute tubulo-interstitial nephritis: Secondary | ICD-10-CM | POA: Diagnosis not present

## 2020-08-08 DIAGNOSIS — J90 Pleural effusion, not elsewhere classified: Secondary | ICD-10-CM | POA: Diagnosis not present

## 2020-08-08 DIAGNOSIS — J9811 Atelectasis: Secondary | ICD-10-CM | POA: Diagnosis not present

## 2020-08-08 DIAGNOSIS — K567 Ileus, unspecified: Secondary | ICD-10-CM | POA: Diagnosis not present

## 2020-08-08 DIAGNOSIS — N12 Tubulo-interstitial nephritis, not specified as acute or chronic: Secondary | ICD-10-CM | POA: Diagnosis not present

## 2020-08-08 DIAGNOSIS — R1084 Generalized abdominal pain: Secondary | ICD-10-CM

## 2020-08-08 DIAGNOSIS — R911 Solitary pulmonary nodule: Secondary | ICD-10-CM | POA: Diagnosis not present

## 2020-08-08 DIAGNOSIS — R109 Unspecified abdominal pain: Secondary | ICD-10-CM | POA: Diagnosis not present

## 2020-08-08 DIAGNOSIS — N151 Renal and perinephric abscess: Secondary | ICD-10-CM | POA: Diagnosis not present

## 2020-08-08 LAB — MAGNESIUM: Magnesium: 2 mg/dL (ref 1.7–2.4)

## 2020-08-08 LAB — GLUCOSE, CAPILLARY
Glucose-Capillary: 78 mg/dL (ref 70–99)
Glucose-Capillary: 75 mg/dL (ref 70–99)

## 2020-08-08 LAB — RENAL FUNCTION PANEL
Albumin: 2 g/dL — ABNORMAL LOW (ref 3.5–5.0)
Anion gap: 11 (ref 5–15)
BUN: 32 mg/dL — ABNORMAL HIGH (ref 6–20)
CO2: 30 mmol/L (ref 22–32)
Calcium: 8.1 mg/dL — ABNORMAL LOW (ref 8.9–10.3)
Chloride: 97 mmol/L — ABNORMAL LOW (ref 98–111)
Creatinine, Ser: 0.98 mg/dL (ref 0.44–1.00)
GFR, Estimated: >60 mL/min (ref >60)
Glucose, Bld: 80 mg/dL (ref 70–99)
Phosphorus: 2.8 mg/dL (ref 2.5–4.6)
Potassium: 3.1 mmol/L — ABNORMAL LOW (ref 3.5–5.1)
Sodium: 138 mmol/L (ref 135–145)

## 2020-08-08 LAB — CBC WITH DIFFERENTIAL/PLATELET
Abs Immature Granulocytes: 0.1 10*3/uL — ABNORMAL HIGH (ref 0.00–0.07)
Basophils Absolute: 0 10*3/uL (ref 0.0–0.1)
Basophils Relative: 0 %
Eosinophils Absolute: 0.1 10*3/uL (ref 0.0–0.5)
Eosinophils Relative: 1 %
HCT: 30.8 % — ABNORMAL LOW (ref 36.0–46.0)
Hemoglobin: 9.9 g/dL — ABNORMAL LOW (ref 12.0–15.0)
Immature Granulocytes: 1 %
Lymphocytes Relative: 9 %
Lymphs Abs: 1 10*3/uL (ref 0.7–4.0)
MCH: 30.4 pg (ref 26.0–34.0)
MCHC: 32.1 g/dL (ref 30.0–36.0)
MCV: 94.5 fL (ref 80.0–100.0)
Monocytes Absolute: 0.7 10*3/uL (ref 0.1–1.0)
Monocytes Relative: 6 %
Neutro Abs: 9.1 10*3/uL — ABNORMAL HIGH (ref 1.7–7.7)
Neutrophils Relative %: 83 %
Platelets: 327 10*3/uL (ref 150–400)
RBC: 3.26 MIL/uL — ABNORMAL LOW (ref 3.87–5.11)
RDW: 14.8 % (ref 11.5–15.5)
WBC: 11.1 10*3/uL — ABNORMAL HIGH (ref 4.0–10.5)
nRBC: 0 % (ref 0.0–0.2)

## 2020-08-08 MED ORDER — POLYVINYL ALCOHOL 1.4 % OP SOLN
1.0000 [drp] | OPHTHALMIC | Status: DC | PRN
  Filled 2020-08-08: qty 15

## 2020-08-08 MED ORDER — POTASSIUM CHLORIDE 10 MEQ/100ML IV SOLN
10.0000 meq | INTRAVENOUS | Status: AC
  Administered 2020-08-08 (×6): 10 meq via INTRAVENOUS
  Filled 2020-08-08 (×2): qty 100

## 2020-08-08 MED ORDER — IOHEXOL 300 MG/ML  SOLN
100.0000 mL | Freq: Once | INTRAMUSCULAR | Status: AC | PRN
  Administered 2020-08-08: 100 mL via INTRAVENOUS

## 2020-08-08 NOTE — Progress Notes (Signed)
Subjective/Chief Complaint:  1. 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 UCX pan-sensitive e .coli. Now on ancef. Also severe DM with improving hyperglyemia. Left nephrostoy and perinephric drains placed 12/25. WBC peak 49 now trending down / stable .  2. UPJ calculus with mild obstructive uropathy and AKI - 30mm left prox ureteral stone by ER CT 12/24. Temporized with left neph tube. Cr peak 3's, now <1.5.   Today "Tammie [name]" continues to improve. Now on weight/renally dosed ancef with WBC much lower and stabilizing as is GFR.   Objective: Vital signs in last 24 hours: Temp:  [97.8 F (36.6 C)-98.1 F (36.7 C)] 98.1 F (36.7 C) (12/31 3016) Pulse Rate:  [78-86] 78 (12/31 0613) Resp:  [16-18] 18 (12/31 0109) BP: (122-141)/(69-78) 133/78 (12/31 0613) SpO2:  [92 %-95 %] 95 % (12/31 3235) Weight:  [80.6 kg] 80.6 kg (12/30 1100) Last BM Date: 08/07/20  Intake/Output from previous day: 12/30 0701 - 12/31 0700 In: 1686 [P.O.:240; I.V.:465.8; IV Piggyback:975.2] Out: 5732 [KGURK:2706; Drains:240] Intake/Output this shift: No intake/output data recorded.  General appearance: alert, cooperative and AOx 3. Pleasant and cooperative.  Head: Normocephalic, without obvious abnormality, atraumatic Eyes: negative Nose: Nares normal. Septum midline. Mucosa normal. No drainage or sinus tenderness. Throat: lips, mucosa, and tongue normal; teeth and gums normal Neck: supple, symmetrical, trachea midline Back: symmetric, no curvature. ROM normal. No CVA tenderness., left perinephric drain wtih some purlulent, somewhat old blood tinged drainage. Left nephrostomt now with non-foul urien and scant debris.  Resp: Non-labored on room air.  Cardio: Nl rate Pelvic: external genitalia normal and No foley Extremities: extremities normal, atraumatic, no cyanosis or edema Skin: Skin color, texture, turgor normal. No rashes or lesions Lymph nodes: Cervical,  supraclavicular, and axillary nodes normal.  Lab Results:  Recent Labs    08/06/20 0220 08/07/20 0506  WBC 15.5* 15.2*  HGB 9.6* 10.6*  HCT 29.6* 33.0*  PLT 308 317   BMET Recent Labs    08/06/20 0220 08/07/20 0506  NA 135 135  K 3.8 3.2*  CL 94* 94*  CO2 29 29  GLUCOSE 87 113*  BUN 56* 41*  CREATININE 1.61* 1.27*  CALCIUM 8.1* 8.3*   PT/INR No results for input(s): LABPROT, INR in the last 72 hours. ABG No results for input(s): PHART, HCO3 in the last 72 hours.  Invalid input(s): PCO2, PO2  Studies/Results: DG Chest 2 View  Result Date: 08/07/2020 CLINICAL DATA:  Sepsis.  Left pyelonephritis. EXAM: CHEST - 2 VIEW COMPARISON:  08/04/2020 FINDINGS: Stable cardiomegaly. Small left pleural effusion and left basilar atelectasis or infiltrate show no significant change. Probable small layering right pleural effusion also noted. Increased airspace opacity is seen in the right upper lobe since prior study. IMPRESSION: Increased right upper lobe airspace opacity. Stable small left pleural effusion, and left basilar atelectasis versus infiltrate. Probable small layering right pleural effusion. Electronically Signed   By: Danae Orleans M.D.   On: 08/07/2020 12:54   DG Abd 2 Views  Result Date: 08/07/2020 CLINICAL DATA:  Sepsis.  Left pyelonephritis. EXAM: ABDOMEN - 2 VIEW COMPARISON:  None. FINDINGS: Mild gaseous distention of colon is seen predominately involving the nondependent sigmoid colon. Scattered air-fluid levels are seen, however there is no evidence of dilated small bowel loops. These findings are likely due to mild ileus. No radiopaque urinary calculi identified. Two percutaneous pigtail catheters are seen in the left upper and lower quadrants. IMPRESSION: Probable mild adynamic ileus. Two percutaneous pigtail  catheters in left upper and lower quadrants. Electronically Signed   By: Danae Orleans M.D.   On: 08/07/2020 12:51    Anti-infectives: Anti-infectives (From  admission, onward)   Start     Dose/Rate Route Frequency Ordered Stop   08/05/20 1000  ceFAZolin (ANCEF) IVPB 2g/100 mL premix        2 g 200 mL/hr over 30 Minutes Intravenous Every 8 hours 08/05/20 0903     08/04/20 2200  ceFAZolin (ANCEF) IVPB 2g/100 mL premix  Status:  Discontinued        2 g 200 mL/hr over 30 Minutes Intravenous Every 12 hours 08/04/20 1131 08/05/20 0903   08/02/20 0000  piperacillin-tazobactam (ZOSYN) IVPB 3.375 g  Status:  Discontinued        3.375 g 12.5 mL/hr over 240 Minutes Intravenous Every 12 hours 08/01/20 1403 08/01/20 2006   08/01/20 2100  cefTRIAXone (ROCEPHIN) 2 g in sodium chloride 0.9 % 100 mL IVPB  Status:  Discontinued        2 g 200 mL/hr over 30 Minutes Intravenous Every 24 hours 08/01/20 2006 08/04/20 1131   08/01/20 1430  piperacillin-tazobactam (ZOSYN) IVPB 3.375 g        3.375 g 100 mL/hr over 30 Minutes Intravenous  Once 08/01/20 1355 08/01/20 1502   07/31/20 2000  cefTRIAXone (ROCEPHIN) 2 g in sodium chloride 0.9 % 100 mL IVPB  Status:  Discontinued        2 g 200 mL/hr over 30 Minutes Intravenous Every 24 hours 07/31/20 1947 08/01/20 1351   07/31/20 2000  azithromycin (ZITHROMAX) 500 mg in sodium chloride 0.9 % 250 mL IVPB  Status:  Discontinued        500 mg 250 mL/hr over 60 Minutes Intravenous Every 24 hours 07/31/20 1947 08/01/20 1351      Assessment/Plan:   1. Emphysematous pyelonephritis - improving clinically on current ABX. We will order repeat CT abd/pelvis after verifying GFR remains good to r/o residual abscess and aide in eventual surgical planning for stone. Fortunately left kidney appears salvageable.   2. 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 Will need lef tureteroscopy likely in elective setting with goal of stone free.  Will follow, greatly appreciate hospitalist and IR teams co-management.    Sebastian Ache 08/08/2020

## 2020-08-08 NOTE — Progress Notes (Signed)
Pharmacy Antibiotic Note  Tammie [name] is a 59 y.o. female admitted on 07/31/2020 with E coli bacteremia.  Pharmacy has been consulted for Cefazolin dosing.  Plan: Continue Ancef 2gm q8h for E coli bacteremia Continue to follow renal function and adjust as needed  Height: 5\' 2"  (157.5 cm) Weight: 80.6 kg (177 lb 11.1 oz) IBW/kg (Calculated) : 50.1  Temp (24hrs), Avg:97.9 F (36.6 C), Min:97.8 F (36.6 C), Max:98.1 F (36.7 C)  Recent Labs  Lab 08/03/20 0412 08/03/20 0445 08/04/20 0115 08/04/20 0745 08/05/20 0750 08/06/20 0220 08/07/20 0506  WBC 30.3*  --   --  16.3* 16.0* 15.5* 15.2*  CREATININE  --    < > 2.90* 2.60* 1.84* 1.61* 1.27*   < > = values in this interval not displayed.    Estimated Creatinine Clearance: 46.9 mL/min (A) (by C-G formula based on SCr of 1.27 mg/dL (H)).    Allergies  Allergen Reactions  . Codeine Rash   Antimicrobials this admission: Zosyn 12/24 >> 12/24 Azith/CTX 12/23 >> 12/24 CTX 12/23 >> 12/27 Ancef per ID Rx rec 12/27 >>  Dose adjustments this admission: 12/29 Ancef 2gm from q12 > q8  12/23 BCx: BCID 2/4 E coli, no resistance 12/24 UCx (clean catch): <10k insignificant growth 12/23 Strep pneumo: neg 12/24 Legionella: neg 12/24 Abscess: E coli, pan sens 12/24 MRSA PCR: neg  Thank you for allowing pharmacy to be a part of this patient's care.  1/25, PharmD, BCPS [facility] Rx [id number] 08/08/2020 7:04 AM

## 2020-08-08 NOTE — Progress Notes (Signed)
PROGRESS NOTE    Tammie [name]  IBB:[id number] DOB: Dec 18, 1960 DOA: 07/31/2020 PCP: Patient, No Pcp Per    Chief Complaint  Patient presents with  . Abdominal Pain    Brief Narrative: 59 year old female admitted 12/24 with chief complaint of abdominal pain.  Found to have acute left pyelonephrosis with abscess, severe sepsis, acute kidney injury, hyper osmolar nonketotic hyperglycemia, severe hyponatremia  -CT imaging showed multiple pulmonary nodules, imaging of abdomen pelvis With left hydronephrosis and evidence of perinephritic abscess Was  transferred to [facility] for placement of left nephrostomy tube and left perirenal drain Critical care asked to admit given severe hyponatremia and concern for clinical decompensation   Assessment & Plan:   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 with type 2 diabetes mellitus (HCC)   Ileus (HCC)  1 severe sepsis with E. coli bacteremia/left emphysematous pyelonephritis with perinephric abscess Patient noted on admission to meet criteria for sepsis with 44,000, glucose level of 700s, nonanion gap metabolic acidosis, acute renal failure, severe hyponatremia with a sodium level as low as 101 admitted to the critical care service.  Patient status post percutaneous nephrostomy and perirenal abscess drainage 08/01/2020.  Patient improving clinically.  Leukocytosis trending down.  Patient currently was on IV antibiotics that was narrowed down to IV Ancef.  Urology following and plan is to  repeat imaging/CT abdomen and pelvis today as renal function has improved.  IR following and managing drains.  Continue Foley cath as recommended per urology.  Urology following and appreciate input and recommendations.    2.  Acute renal failure Questionable etiology.  Patient has not had medical care in about 30 years and as such underlying baseline renal function unknown.  Patient on admission was seen in consultation by nephrology.  Is felt patient's acute renal failure likely multifactorial secondary to emphysematous pyelonephritis with abscess, ischemic ATN in the setting of volume depletion.  Patient status post percutaneous nephrostomy and perirenal abscess drainage.  Foley catheter in place.  Urine output of 3.645 L over the past 24 hours.  Renal function improving and trending down.  Creatinine at 0.98.  Urology following and recommending repeat CT abdomen and pelvis today.  Per urology.   3.  Abdominal discomfort Patient with some complaints of abdominal discomfort could likely be secondary to problem #1.  Patient with abdominal distention noted on examination.  Patient did state had some loose bowel movements.  Abdominal films obtained concerning for possible ileus.  Patient for CT abdomen and pelvis per urology.  Patient currently n.p.o.  4.  Hypokalemia/hypomagnesemia Magnesium level at 2.0.  Potassium at 3.1.  KCl 10 mEq every hour x5 g.  Keep potassium  > 4.  Keep magnesium  > 2.   5.  Newly diagnosed diabetes mellitus type 2/HHNK Hemoglobin A1c noted at 12.2.  Initially was on Lantus 10 units twice daily.  Hold Lantus this morning as patient currently n.p.o. sliding scale insulin.  Diabetes coordinator following.   6.  Severe hyponatremia Questionable etiology.  Initially treated  with 3% normal saline which has subsequently been discontinued.  Patient was switched to isotonic bicarb and changed to LR 08/05/2020.  Sodium level normalized.  Follow.  6.  Multiple pulmonary  nodules Differential includes septic emboli versus metastatic malignancy. Once renal function improves urology recommending repeat CT with IV contrast.  Pulmonary recommended when repeat CT is done to get a repeat chest at that same time.  Patient will need outpatient follow-up with pulmonary.  Patient to have CT abdomen and pelvis done today, per urology note,  and as such we will add CT chest for further evaluation.  Patient will likely eventually need tissue sampling to rule out malignancy per pulmonary.  7.  Bibasilar crackles Noted on physical exam.  Chest x-ray done with increased right upper lobe airspace opacity, stable small left pleural effusion and left basilar atelectasis versus infiltrate.  Probable small layering right pleural effusion.  Patient denies any significant shortness of breath.  Patient with a urine output of 3.645 L over the past 24 hours.  Strict I's and O's.  Daily weights.  Follow.  8. ??  Ileus Patient with some diffuse abdominal pain over the past 1 to 2 days.  Passing flatus.  Abdominal films done concerning for possible ileus.  Patient currently n.p.o.  Replete electrolytes and keep potassium > 4, magnesium > 2.  CT abdomen and pelvis pending.  Follow.   DVT prophylaxis: Heparin Code Status: Full Family Communication: Updated patient and husband at bedside. Disposition:   Status is: Inpatient    Dispo: The patient is from: Home              Anticipated d/c is to: To be determined              Anticipated d/c date is: To be determined.              Patient currently on IV antibiotics, has percutaneous nephrostomy drains, not stable for discharge.       Consultants:   Urology: Dr. Arita Miss 08/01/2020  Nephrology: Dr. Arrie Aran 08/01/2020  General surgery: Dr. Henreitta Leber 08/01/2020([facility])   Significant Hospital Events:  12/24 admitted with chief complaint of abdominal pain.  Diagnostic imaging from CT abdomen pelvis showing left  hydronephrosis, pyelonephritis, and perinephritic abscess.  Blood cultures preliminarily showing gram-negative rods, CT chest with multiple pulmonary nodules.Presenting blood glucose 706, presenting sodium 1 less than 102.  Beta hydroxybutyric acid was negative.  Was given IV hydration.  Started on empiric antibiotics.  Transferred to [facility] following urology consultation recommending left nephrostomy tube and perinephritic drain.  Critical care asked to admit: Left PCN in left perirenal abscess drain both placed on 12/24 12/25 Sodium improved from 10 2-1 1 4   , 3% saline stopped at 3 AM, placed on bicarbonate infusion. Off pressors 12/26-12/29: 12/27: Switched to Ancef given sensitivities bicarb continued. 12/28: Bicarbonate changed to LR 12/29: Doing better.  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 coli Respiratory panel by PCR 12/23: Negative for Covid and influenza. Catheterized urine from drain 08/01/2020>>>> E. coli     Subjective: Patient complaining of pain diffusely.  Denies any shortness of breath.  Passing flatus.  No bowel movement.  Some diffuse abdominal pain.  Objective: Vitals:   08/07/20 1100  08/07/20 1346 08/07/20 2213 08/08/20 0613  BP:  122/69 (!) 141/74 133/78  Pulse:  79 86 78  Resp:  Temp:  97.8 F (36.6 C) 97.8 F (36.6 C) 98.1 F (36.7 C)  TempSrc:   Oral   SpO2:  92% 94% 95%  Weight: 80.6 kg     Height:        Intake/Output Summary (Last 24 hours) at 08/08/2020 1126 Last data filed at 08/08/2020 1024 Gross per 24 hour  Intake 1741.88 ml  Output 4035 ml  Net -2293.12 ml   Filed Weights   07/31/20 1702 08/01/20 0000 08/07/20 1100  Weight: 79.4 kg 81.1 kg 80.6 kg    Examination:  General exam: NAD Respiratory system: Lungs clear to auscultation bilaterally.  No wheezes, no crackles, no rhonchi.  Normal respiratory effort.  Speaking in full sentences.  Cardiovascular system: RRR no murmurs rubs or gallops.  No JVD.  No lower extremity edema.  Gastrointestinal system: Abdomen is distended, soft, some diffuse tenderness to palpation.  Positive bowel sounds.  No rebound.  No guarding.  Central nervous system: Alert and oriented. No focal neurological deficits. Extremities: Symmetric 5 x 5 power. Skin: No rashes, lesions or ulcers Psychiatry: Judgement and insight appear normal. Mood & affect appropriate.     Data Reviewed: I have personally reviewed following labs and imaging studies  CBC: Recent Labs  Lab 08/02/20 0400 08/03/20 0412 08/04/20 0745 08/05/20 0750 08/06/20 0220 08/07/20 0506 08/08/20 0823  WBC 47.6* 30.3* 16.3* 16.0* 15.5* 15.2* 11.1*  NEUTROABS 43.5* 27.3*  --   --   --   --  9.1*  HGB 10.4* 10.5* 10.5* 10.4* 9.6* 10.6* 9.9*  HCT 27.9* 28.3* 29.2* 30.5* 29.6* 33.0* 30.8*  MCV 83.3 83.7 86.9 89.4 92.8 95.1 94.5  PLT 237 273 308 315 308 317 327    Basic Metabolic Panel: Recent Labs  Lab 08/03/20 0445 08/03/20 1319 08/04/20 0500 08/04/20 0745 08/05/20 0750 08/06/20 0220 08/07/20 0506 08/07/20 0758 08/08/20 0502 08/08/20 0823  NA 124*   < >  --  129* 133* 135 135  --   --  138  K 2.7*   < >  --  3.2* 3.1* 3.8  3.2*  --   --  3.1*  CL 87*   < >  --  87* 89* 94* 94*  --   --  97*  CO2 20*   < >  --  --   --  30  GLUCOSE 92   < >  --  104* 146* 87 113*  --   --  80  BUN 103*   < >  --  87* 74* 56* 41*  --   --  32*  CREATININE 3.26*   < >  --  2.60* 1.84* 1.61* 1.27*  --   --  0.98  CALCIUM 7.7*   < >  --  8.1* 8.1* 8.1* 8.3*  --   --  8.1*  MG 2.5*  --  2.2  --  1.8 1.6*  --  1.6* 2.0  --   PHOS 6.7*  --   --   --  4.1 3.4  --   --   --  2.8   < > = values in this interval not displayed.    GFR: Estimated Creatinine Clearance: 60.8 mL/min (by C-G formula based on SCr of 0.98 mg/dL).  Liver Function Tests: Recent Labs  Lab 08/08/20 0823  ALBUMIN 2.0*    CBG: Recent Labs  Lab 08/06/20 2242 08/07/20 0811 08/07/20 1129 08/07/20 1650 08/07/20 2240  GLUCAP 153* 118* 173* 240* 122*     Recent Results (from the past 240 hour(s))  Culture, blood (single)     Status: Abnormal   Collection Time: 07/31/20  5:26 PM   Specimen: BLOOD  Result Value Ref Range Status   Specimen Description   Final    BLOOD LEFT ANTECUBITAL Performed at [facility], [address] [place], [place] [address]    Special Requests   Final    BOTTLES DRAWN AEROBIC AND ANAEROBIC Blood Culture adequate volume Performed at [facility], [address] [place], [place] [address]    Culture  Setup Time   Final    GRAM NEGATIVE RODS AEROBIC AND ANEROBIC BOTTLES Gram Stain Report Called to,Read Back By and Verified With: LARIMORE, A. 12/24 @ 0740 BEARD, S.  CRITICAL RESULT CALLED TO, READ BACK BY AND VERIFIED WITH: PHARMD  Lucita Lora [id number] FCP Performed at [facility], [address] [place], [place] [address]    Culture ESCHERICHIA COLI (A)  Final   Report Status 08/03/2020 FINAL  Final   Organism ID, Bacteria ESCHERICHIA COLI  Final      Susceptibility   Escherichia coli - MIC*    AMPICILLIN <=2 SENSITIVE Sensitive     CEFAZOLIN <=4 SENSITIVE Sensitive     CEFEPIME <=0.12 SENSITIVE  Sensitive     CEFTAZIDIME <=1 SENSITIVE Sensitive     CEFTRIAXONE <=0.25 SENSITIVE Sensitive     CIPROFLOXACIN <=0.25 SENSITIVE Sensitive     GENTAMICIN <=1 SENSITIVE Sensitive     IMIPENEM <=0.25 SENSITIVE Sensitive     TRIMETH/SULFA <=20 SENSITIVE Sensitive     AMPICILLIN/SULBACTAM <=2 SENSITIVE Sensitive     PIP/TAZO <=4 SENSITIVE Sensitive     * ESCHERICHIA COLI  Blood Culture ID Panel (Reflexed)     Status: Abnormal   Collection Time: 07/31/20  5:26 PM  Result Value Ref Range Status   Enterococcus faecalis NOT DETECTED NOT DETECTED Final   Enterococcus Faecium NOT DETECTED NOT DETECTED Final   Listeria monocytogenes NOT DETECTED NOT DETECTED Final   Staphylococcus species NOT DETECTED NOT DETECTED Final   Staphylococcus aureus (BCID) NOT DETECTED NOT DETECTED Final   Staphylococcus epidermidis NOT DETECTED NOT DETECTED Final   Staphylococcus lugdunensis NOT DETECTED NOT DETECTED Final   Streptococcus species NOT DETECTED NOT DETECTED Final   Streptococcus agalactiae NOT DETECTED NOT DETECTED Final   Streptococcus pneumoniae NOT DETECTED NOT DETECTED Final   Streptococcus pyogenes NOT DETECTED NOT DETECTED Final   A.calcoaceticus-baumannii NOT DETECTED NOT DETECTED Final   Bacteroides fragilis NOT DETECTED NOT DETECTED Final   Enterobacterales DETECTED (A) NOT DETECTED Final    Comment: Enterobacterales represent a large order of gram negative bacteria, not a single organism. CRITICAL RESULT CALLED TO, READ BACK BY AND VERIFIED WITH: PHARMD  TERRY G. [id number]  1921 FCP    Enterobacter cloacae complex NOT DETECTED NOT DETECTED Final   Escherichia coli DETECTED (A) NOT DETECTED Final    Comment: CRITICAL RESULT CALLED TO, READ BACK BY AND VERIFIED WITH: PHARMD  TERRY G. [id number] FCP    Klebsiella aerogenes NOT DETECTED NOT DETECTED Final   Klebsiella oxytoca NOT DETECTED NOT DETECTED Final   Klebsiella pneumoniae NOT DETECTED NOT DETECTED Final   Proteus species NOT DETECTED  NOT DETECTED Final   Salmonella species NOT DETECTED NOT DETECTED Final   Serratia marcescens NOT DETECTED NOT DETECTED Final   Haemophilus influenzae NOT DETECTED NOT DETECTED Final   Neisseria meningitidis NOT DETECTED NOT DETECTED Final   Pseudomonas aeruginosa NOT DETECTED NOT DETECTED Final   Stenotrophomonas maltophilia NOT DETECTED NOT DETECTED Final   Candida albicans NOT DETECTED NOT DETECTED Final   Candida auris NOT DETECTED NOT DETECTED Final   Candida glabrata NOT DETECTED NOT DETECTED Final   Candida krusei NOT DETECTED NOT DETECTED Final   Candida parapsilosis NOT DETECTED NOT DETECTED Final   Candida tropicalis NOT DETECTED NOT DETECTED Final   Cryptococcus neoformans/gattii NOT DETECTED NOT DETECTED Final   CTX-M ESBL NOT DETECTED NOT DETECTED Final   Carbapenem resistance IMP NOT DETECTED NOT DETECTED Final   Carbapenem resistance KPC NOT DETECTED NOT DETECTED Final   Carbapenem resistance NDM NOT DETECTED NOT DETECTED Final   Carbapenem resist OXA 48 LIKE NOT DETECTED NOT DETECTED Final   Carbapenem resistance VIM NOT DETECTED NOT DETECTED Final    Comment: Performed at [facility], [address] [place], [place] [address]  Resp Panel by RT-PCR (Flu A&B, Covid) Nasopharyngeal Swab     Status: None   Collection Time: 07/31/20  5:39 PM   Specimen: Nasopharyngeal Swab; Nasopharyngeal(NP) swabs in vial transport medium  Result Value Ref Range Status   SARS Coronavirus 2 by RT PCR NEGATIVE NEGATIVE Final    Comment: (NOTE) SARS-CoV-2 target nucleic acids are NOT DETECTED.  The SARS-CoV-2 RNA is generally detectable in upper respiratory specimens during the acute phase of infection. The lowest concentration of SARS-CoV-2 viral copies this assay can detect is 138 copies/mL. A negative result does not preclude SARS-Cov-2 infection and should not be used as the sole basis for treatment or other patient management decisions. A negative result may occur with   improper specimen collection/handling, submission of specimen other than nasopharyngeal swab, presence of viral mutation(s) within the areas targeted by this assay, and inadequate number of viral copies(<138 copies/mL). A negative result must be combined with clinical observations, patient history, and epidemiological information. The expected result is Negative.  Fact Sheet for Patients:  BloggerCourse.comhttps://www.fda.gov/media/152166/download  Fact Sheet for Healthcare Providers:  SeriousBroker.ithttps://www.fda.gov/media/152162/download  This test is no t yet approved or cleared by the [place] FDA and  has been authorized for detection and/or diagnosis of SARS-CoV-2 by FDA under an Emergency Use Authorization (EUA). This EUA will remain  in effect (meaning this test can be used) for the duration of the COVID-19 declaration under Section 564(b)(1) of the Act, 21 U.S.C.section 360bbb-3(b)(1), unless the authorization is terminated  or revoked sooner.       Influenza A by PCR NEGATIVE NEGATIVE Final   Influenza B by PCR NEGATIVE NEGATIVE Final    Comment: (NOTE) The Xpert Xpress SARS-CoV-2/FLU/RSV plus assay is intended as an aid in the diagnosis of influenza from Nasopharyngeal swab specimens and should not be used as a sole basis for treatment. Nasal washings and aspirates are  unacceptable for Xpert Xpress SARS-CoV-2/FLU/RSV testing.  Fact Sheet for Patients: BloggerCourse.com  Fact Sheet for Healthcare Providers: SeriousBroker.it  This test is not yet approved or cleared by the [place] FDA and has been authorized for detection and/or diagnosis of SARS-CoV-2 by FDA under an Emergency Use Authorization (EUA). This EUA will remain in effect (meaning this test can be used) for the duration of the COVID-19 declaration under Section 564(b)(1) of the Act, 21 U.S.C. section 360bbb-3(b)(1), unless the authorization is terminated  or revoked.  Performed at [facility], [address] [place], [place] [address]   Culture, blood (single)     Status: None   Collection Time: 07/31/20  8:23 PM   Specimen: BLOOD RIGHT WRIST  Result Value Ref Range Status   Specimen Description BLOOD RIGHT WRIST  Final   Special Requests   Final    BOTTLES DRAWN AEROBIC AND ANAEROBIC Blood Culture results may not be optimal due to an excessive volume of blood received in culture bottles   Culture   Final    NO GROWTH 5 DAYS Performed at [facility], [address] [place], [place] [address]    Report Status 08/05/2020 FINAL  Final  Urine Culture     Status: Abnormal   Collection Time: 08/01/20 11:28 AM   Specimen: Urine, Clean Catch  Result Value Ref Range Status   Specimen Description   Final    URINE, CLEAN CATCH Performed at [facility], [address] [place], [place] [address]    Special Requests   Final    NONE Performed at [facility], [address] [place], [place] [address]    Culture (A)  Final    <10,000 COLONIES/mL INSIGNIFICANT GROWTH Performed at [facility], [address], [place], [place] [address]    Report Status 08/02/2020 FINAL  Final  MRSA PCR Screening     Status: None   Collection Time: 08/01/20  3:26 PM   Specimen: Nasopharyngeal  Result Value Ref Range Status   MRSA by PCR NEGATIVE NEGATIVE Final    Comment:        The GeneXpert MRSA Assay (FDA approved for NASAL specimens only), is one component of a comprehensive MRSA colonization surveillance program. It is not intended to diagnose MRSA infection nor to guide or monitor treatment for MRSA infections. Performed at [facility], [address], [place], [place] [address]   Aerobic/Anaerobic Culture (surgical/deep wound)     Status: None   Collection Time: 08/01/20  7:37 PM   Specimen: Urine, Catheterized  Result Value Ref Range Status   Specimen Description   Final    URINE, CATHETERIZED URINE FROM  DRAIN Performed at [facility], [address] [place], [place] [address]    Special Requests   Final    NONE Performed at [facility], [address] [place], [place] [address]    Gram Stain   Final    ABUNDANT WBC PRESENT, PREDOMINANTLY PMN RARE GRAM NEGATIVE RODS RARE GRAM POSITIVE RODS    Culture   Final    FEW ESCHERICHIA COLI NO ANAEROBES ISOLATED Performed at [facility], [address], [place], [place] [address]    Report Status 08/07/2020 FINAL  Final   Organism ID, Bacteria ESCHERICHIA COLI  Final      Susceptibility   Escherichia coli - MIC*    AMPICILLIN 4 SENSITIVE Sensitive     CEFAZOLIN <=4 SENSITIVE Sensitive  CEFEPIME <=0.12 SENSITIVE Sensitive     CEFTRIAXONE <=0.25 SENSITIVE Sensitive     CIPROFLOXACIN <=0.25 SENSITIVE Sensitive     GENTAMICIN <=1 SENSITIVE Sensitive     IMIPENEM <=0.25 SENSITIVE Sensitive     NITROFURANTOIN <=16 SENSITIVE Sensitive     TRIMETH/SULFA <=20 SENSITIVE Sensitive     AMPICILLIN/SULBACTAM <=2 SENSITIVE Sensitive     PIP/TAZO <=4 SENSITIVE Sensitive     * FEW ESCHERICHIA COLI  Aerobic/Anaerobic Culture (surgical/deep wound)     Status: None   Collection Time: 08/01/20  7:38 PM   Specimen: Abscess  Result Value Ref Range Status   Specimen Description   Final    ABSCESS Performed at [facility] [address], [place], [place] [address]    Special Requests   Final    NONE Performed at [facility], [address] [place], [place] [address]    Gram Stain   Final    ABUNDANT WBC PRESENT, PREDOMINANTLY PMN MODERATE GRAM NEGATIVE RODS    Culture   Final    ABUNDANT ESCHERICHIA COLI NO ANAEROBES ISOLATED Performed at [facility], [address] [place], [place] [address]    Report Status 08/07/2020 FINAL  Final   Organism ID, Bacteria ESCHERICHIA COLI  Final      Susceptibility   Escherichia coli - MIC*    AMPICILLIN <=2  SENSITIVE Sensitive     CEFAZOLIN <=4 SENSITIVE Sensitive     CEFEPIME <=0.12 SENSITIVE Sensitive     CEFTAZIDIME <=1 SENSITIVE Sensitive     CEFTRIAXONE <=0.25 SENSITIVE Sensitive     CIPROFLOXACIN <=0.25 SENSITIVE Sensitive     GENTAMICIN <=1 SENSITIVE Sensitive     IMIPENEM <=0.25 SENSITIVE Sensitive     TRIMETH/SULFA <=20 SENSITIVE Sensitive     AMPICILLIN/SULBACTAM <=2 SENSITIVE Sensitive     PIP/TAZO <=4 SENSITIVE Sensitive     * ABUNDANT ESCHERICHIA COLI         Radiology Studies: DG Chest 2 View  Result Date: 08/07/2020 CLINICAL DATA:  Sepsis.  Left pyelonephritis. EXAM: CHEST - 2 VIEW COMPARISON:  08/04/2020 FINDINGS: Stable cardiomegaly. Small left pleural effusion and left basilar atelectasis or infiltrate show no significant change. Probable small layering right pleural effusion also noted. Increased airspace opacity is seen in the right upper lobe since prior study. IMPRESSION: Increased right upper lobe airspace opacity. Stable small left pleural effusion, and left basilar atelectasis versus infiltrate. Probable small layering right pleural effusion. Electronically Signed   By: Danae Orleans M.D.   On: 08/07/2020 12:54   DG Abd 2 Views  Result Date: 08/08/2020 CLINICAL DATA:  Abdominal pain and decreased oral intake. EXAM: ABDOMEN - 2 VIEW COMPARISON:  Plain film of the abdomen dated 08/07/2020. FINDINGS: Mildly distended small bowel within the central abdomen with nonspecific air-fluid levels in the upper pelvis. Air is again seen within the colon. No evidence of renal or ureteral calculi. Two percutaneous pigtail catheters are again seen within the LEFT abdomen. Dense consolidation the LEFT lung base, presumed pneumonia. IMPRESSION: 1. Mildly distended small bowel within the central abdomen with nonspecific air-fluid levels in the upper pelvis. Findings could represent ileus or partial small bowel obstruction. 2. LEFT lower lung consolidation, pneumonia versus atelectasis.  Electronically Signed   By: Bary Richard M.D.   On: 08/08/2020 10:04   DG Abd 2 Views  Result Date: 08/07/2020 CLINICAL DATA:  Sepsis.  Left pyelonephritis. EXAM: ABDOMEN - 2 VIEW COMPARISON:  None. FINDINGS: Mild gaseous distention  of colon is seen predominately involving the nondependent sigmoid colon. Scattered air-fluid levels are seen, however there is no evidence of dilated small bowel loops. These findings are likely due to mild ileus. No radiopaque urinary calculi identified. Two percutaneous pigtail catheters are seen in the left upper and lower quadrants. IMPRESSION: Probable mild adynamic ileus. Two percutaneous pigtail catheters in left upper and lower quadrants. Electronically Signed   By: Danae Orleans M.D.   On: 08/07/2020 12:51        Scheduled Meds: . chlorhexidine  15 mL Mouth Rinse BID  . Chlorhexidine Gluconate Cloth  6 each Topical Daily  . feeding supplement (GLUCERNA SHAKE)  237 mL Oral BID BM  . heparin injection (subcutaneous)  5,000 Units Subcutaneous Q8H  . insulin aspart  0-20 Units Subcutaneous TID WC  . insulin aspart  0-5 Units Subcutaneous QHS  . insulin glargine  8 Units Subcutaneous BID  . mouth rinse  15 mL Mouth Rinse q12n4p  . sodium chloride flush  5 mL Intracatheter Q8H   Continuous Infusions: .  ceFAZolin (ANCEF) IV 2 g (08/08/20 0515)  . lactated ringers 50 mL/hr at 08/07/20 1939  . potassium chloride 10 mEq (08/08/20 1113)  . sodium chloride       LOS: 8 days    Time spent: 40 minutes    Ramiro Harvest, MD [facility]   To contact the attending provider between 7A-7P or the covering provider during after hours 7P-7A, please log into the web site [email or web address] and access using universal [place] password for that web site. If you do not have the password, please call the hospital operator.  08/08/2020, 11:26 AM

## 2020-08-09 ENCOUNTER — Inpatient Hospital Stay: Payer: Self-pay

## 2020-08-09 ENCOUNTER — Encounter (HOSPITAL_COMMUNITY): Payer: Self-pay | Admitting: Internal Medicine

## 2020-08-09 DIAGNOSIS — A419 Sepsis, unspecified organism: Secondary | ICD-10-CM

## 2020-08-09 DIAGNOSIS — R6520 Severe sepsis without septic shock: Secondary | ICD-10-CM

## 2020-08-09 DIAGNOSIS — N10 Acute tubulo-interstitial nephritis: Secondary | ICD-10-CM

## 2020-08-09 DIAGNOSIS — E1165 Type 2 diabetes mellitus with hyperglycemia: Secondary | ICD-10-CM

## 2020-08-09 DIAGNOSIS — E1100 Type 2 diabetes mellitus with hyperosmolarity without nonketotic hyperglycemic-hyperosmolar coma (NKHHC): Secondary | ICD-10-CM

## 2020-08-09 DIAGNOSIS — E669 Obesity, unspecified: Secondary | ICD-10-CM

## 2020-08-09 DIAGNOSIS — E1110 Type 2 diabetes mellitus with ketoacidosis without coma: Secondary | ICD-10-CM

## 2020-08-09 DIAGNOSIS — E871 Hypo-osmolality and hyponatremia: Secondary | ICD-10-CM

## 2020-08-09 LAB — COMPREHENSIVE METABOLIC PANEL
ALT: 8 U/L (ref 0–44)
AST: 22 U/L (ref 15–41)
Albumin: 2 g/dL — ABNORMAL LOW (ref 3.5–5.0)
Alkaline Phosphatase: 165 U/L — ABNORMAL HIGH (ref 38–126)
Anion gap: 9 (ref 5–15)
BUN: 24 mg/dL — ABNORMAL HIGH (ref 6–20)
CO2: 28 mmol/L (ref 22–32)
Calcium: 8 mg/dL — ABNORMAL LOW (ref 8.9–10.3)
Chloride: 100 mmol/L (ref 98–111)
Creatinine, Ser: 1.03 mg/dL — ABNORMAL HIGH (ref 0.44–1.00)
GFR, Estimated: >60 mL/min (ref >60)
Glucose, Bld: 126 mg/dL — ABNORMAL HIGH (ref 70–99)
Potassium: 3.6 mmol/L (ref 3.5–5.1)
Sodium: 137 mmol/L (ref 135–145)
Total Bilirubin: 0.7 mg/dL (ref 0.3–1.2)
Total Protein: 5.1 g/dL — ABNORMAL LOW (ref 6.5–8.1)

## 2020-08-09 LAB — CBC WITH DIFFERENTIAL/PLATELET
Abs Immature Granulocytes: 0.07 10*3/uL (ref 0.00–0.07)
Basophils Absolute: 0 10*3/uL (ref 0.0–0.1)
Basophils Relative: 0 %
Eosinophils Absolute: 0.1 10*3/uL (ref 0.0–0.5)
Eosinophils Relative: 1 %
HCT: 28 % — ABNORMAL LOW (ref 36.0–46.0)
Hemoglobin: 9 g/dL — ABNORMAL LOW (ref 12.0–15.0)
Immature Granulocytes: 1 %
Lymphocytes Relative: 9 %
Lymphs Abs: 1 10*3/uL (ref 0.7–4.0)
MCH: 30.9 pg (ref 26.0–34.0)
MCHC: 32.1 g/dL (ref 30.0–36.0)
MCV: 96.2 fL (ref 80.0–100.0)
Monocytes Absolute: 0.6 10*3/uL (ref 0.1–1.0)
Monocytes Relative: 6 %
Neutro Abs: 8.9 10*3/uL — ABNORMAL HIGH (ref 1.7–7.7)
Neutrophils Relative %: 83 %
Platelets: 297 10*3/uL (ref 150–400)
RBC: 2.91 MIL/uL — ABNORMAL LOW (ref 3.87–5.11)
RDW: 14.8 % (ref 11.5–15.5)
WBC: 10.6 10*3/uL — ABNORMAL HIGH (ref 4.0–10.5)
nRBC: 0 % (ref 0.0–0.2)

## 2020-08-09 LAB — GLUCOSE, CAPILLARY
Glucose-Capillary: 117 mg/dL — ABNORMAL HIGH (ref 70–99)
Glucose-Capillary: 230 mg/dL — ABNORMAL HIGH (ref 70–99)
Glucose-Capillary: 154 mg/dL — ABNORMAL HIGH (ref 70–99)
Glucose-Capillary: 150 mg/dL — ABNORMAL HIGH (ref 70–99)

## 2020-08-09 LAB — MAGNESIUM: Magnesium: 1.6 mg/dL — ABNORMAL LOW (ref 1.7–2.4)

## 2020-08-09 LAB — PHOSPHORUS: Phosphorus: 2.9 mg/dL (ref 2.5–4.6)

## 2020-08-09 MED ORDER — POTASSIUM CHLORIDE 10 MEQ/100ML IV SOLN
10.0000 meq | INTRAVENOUS | Status: AC
Start: 2020-08-09 — End: 2020-08-09
  Administered 2020-08-09 (×4): 10 meq via INTRAVENOUS
  Filled 2020-08-09 (×4): qty 100

## 2020-08-09 MED ORDER — SODIUM CHLORIDE 0.9% FLUSH
10.0000 mL | INTRAVENOUS | Status: DC | PRN
  Administered 2020-08-12 – 2020-08-19 (×3): 10 mL

## 2020-08-09 MED ORDER — MAGNESIUM SULFATE 4 GM/100ML IV SOLN
4.0000 g | Freq: Once | INTRAVENOUS | Status: AC
  Administered 2020-08-09: 4 g via INTRAVENOUS
  Filled 2020-08-09: qty 100

## 2020-08-09 MED ORDER — ACETAMINOPHEN 325 MG PO TABS
650.0000 mg | ORAL_TABLET | ORAL | Status: DC | PRN
  Administered 2020-08-14: 650 mg via ORAL
  Filled 2020-08-09 (×2): qty 2

## 2020-08-09 MED ORDER — SODIUM CHLORIDE 0.9% FLUSH
10.0000 mL | Freq: Two times a day (BID) | INTRAVENOUS | Status: DC
  Administered 2020-08-11 – 2020-08-12 (×3): 10 mL

## 2020-08-09 MED ORDER — OXYCODONE-ACETAMINOPHEN 5-325 MG PO TABS: 1.0000 | ORAL_TABLET | Freq: Four times a day (QID) | ORAL | Status: DC | PRN

## 2020-08-09 MED ORDER — FUROSEMIDE 10 MG/ML IJ SOLN
40.0000 mg | Freq: Two times a day (BID) | INTRAMUSCULAR | Status: AC
  Administered 2020-08-09 – 2020-08-10 (×2): 40 mg via INTRAVENOUS
  Filled 2020-08-09 (×2): qty 4

## 2020-08-09 NOTE — Progress Notes (Signed)
Dear Doctor:  This patient has been identified as a candidate for PICC for the following reason (s): poor veins/poor circulatory system (CHF, COPD, emphysema, diabetes, steroid use, IV drug abuse, etc.) and restarts due to phlebitis and infiltration in 24 hours If you agree, please write an order for the indicated device. For any questions contact the Vascular Access Team at [phone] if no answer, please leave a message.  Thank you for supporting the early vascular access assessment program. 

## 2020-08-09 NOTE — Plan of Care (Signed)
Respiratory Issues improving

## 2020-08-09 NOTE — Progress Notes (Signed)
Physical Therapy Treatment Patient Details Name: Tammie [name] MRN: [id number] DOB: 1961/07/23 Today's Date: 08/09/2020    History of Present Illness 60 year old female admitted 12/24 with chief complaint of abdominal pain.  Found to have acute left pyelonephrosis with abscess, severe sepsis, acute kidney injury, hyper osmolar nonketotic hyperglycemia, severe hyponatremia   -CT imaging showed multiple pulmonary nodules, imaging of abdomen pelvis With left hydronephrosis and evidence of perinephritic abscess.  Pt s/p placement of left nephrostomy tube and left perirenal drain both placed 12/24.    PT Comments    Progressing with mobility.    Follow Up Recommendations  Supervision for mobility/OOB     Equipment Recommendations  Rolling walker with 5" wheels    Recommendations for Other Services       Precautions / Restrictions Precautions Precautions: Fall Precaution Comments: 2 left posterior PCN drains Restrictions Weight Bearing Restrictions: No    Mobility  Bed Mobility Overal bed mobility: Needs Assistance Bed Mobility: Supine to Sit     Supine to sit: Min guard;HOB elevated     General bed mobility comments: No physical assistance provided. Pt relied moderately on bedrail  Transfers Overall transfer level: Needs assistance Equipment used: Rolling walker (2 wheeled) Transfers: Sit to/from Stand Sit to Stand: Min assist         General transfer comment: Assist to power up and stabilize. Cues for safety, hand placement.  Ambulation/Gait Ambulation/Gait assistance: Min assist;Min guard Gait Distance (Feet): 115 Feet Assistive device: Rolling walker (2 wheeled) Gait Pattern/deviations: Step-through pattern;Decreased stride length     General Gait Details: Assist to stabilize intermittently. Dyspnea 2/4. Pt tolerated distance fairly well.   Stairs             Wheelchair Mobility    Modified Rankin (Stroke Patients Only)       Balance Overall  balance assessment: Needs assistance         Standing balance support: Bilateral upper extremity supported Standing balance-Leahy Scale: Fair                              Cognition Arousal/Alertness: Awake/alert Behavior During Therapy: WFL for tasks assessed/performed Overall Cognitive Status: Within Functional Limits for tasks assessed                                 General Comments: dry humor      Exercises      General Comments        Pertinent Vitals/Pain Pain Assessment: Faces Faces Pain Scale: Hurts a little bit Pain Location: drain site Pain Descriptors / Indicators: Discomfort;Sore Pain Intervention(s): Monitored during session;Repositioned    Home Living                      Prior Function            PT Goals (current goals can now be found in the care plan section) Progress towards PT goals: Progressing toward goals    Frequency    Min 3X/week      PT Plan Current plan remains appropriate    Co-evaluation              AM-PAC PT "6 Clicks" Mobility   Outcome Measure  Help needed turning from your back to your side while in a flat bed without using bedrails?: A Little Help needed moving from lying on your  back to sitting on the side of a flat bed without using bedrails?: A Little Help needed moving to and from a bed to a chair (including a wheelchair)?: A Little Help needed standing up from a chair using your arms (e.g., wheelchair or bedside chair)?: A Little Help needed to walk in hospital room?: A Little Help needed climbing 3-5 steps with a railing? : A Little 6 Click Score: 18    End of Session   Activity Tolerance: Patient tolerated treatment well Patient left: in chair;with call bell/phone within reach;with family/visitor present   PT Visit Diagnosis: Difficulty in walking, not elsewhere classified (R26.2)     Time: 3785-8850 PT Time Calculation (min) (ACUTE ONLY): 20 min  Charges:   $Gait Training: 8-22 mins                         Faye Ramsay, PT Acute Rehabilitation  Office: [phone] Pager: [phone]

## 2020-08-09 NOTE — Progress Notes (Signed)
Subjective: Patient reports feeling better today.  She is afebrile tolerating p.o.'s.  Has been ambulating in the hall..  Objective: Vital signs in last 24 hours: Temp:  [97.6 F (36.4 C)-98.5 F (36.9 C)] 97.6 F (36.4 C) (01/01 0514) Pulse Rate:  [83-94] 83 (01/01 0514) Resp:  [16-19] 19 (01/01 0514) BP: (136-153)/(69-82) 142/80 (01/01 0514) SpO2:  [94 %-97 %] 97 % (01/01 0514)  Intake/Output from previous day: 12/31 0701 - 01/01 0700 In: 1062.2 [I.V.:472.5; IV Piggyback:589.7] Out: 1840 [Urine:1710; Drains:130] Intake/Output this shift: No intake/output data recorded.  Physical Exam:  General:alert, cooperative, appears stated age and moderately obese GI: Abdomen is soft} Female genitalia: not done   Lab Results: Recent Labs    08/07/20 0506 08/08/20 0823 08/09/20 0528  HGB 10.6* 9.9* 9.0*  HCT 33.0* 30.8* 28.0*   BMET Recent Labs    08/08/20 0823 08/09/20 0528  NA 138 137  K 3.1* 3.6  CL 97* 100  CO2 30 28  GLUCOSE 80 126*  BUN 32* 24*  CREATININE 0.98 1.03*  CALCIUM 8.1* 8.0*   No results for input(s): LABPT, INR in the last 72 hours. No results for input(s): LABURIN in the last 72 hours. Results for orders placed or performed during the hospital encounter of 07/31/20  Culture, blood (single)     Status: Abnormal   Collection Time: 07/31/20  5:26 PM   Specimen: BLOOD  Result Value Ref Range Status   Specimen Description   Final    BLOOD LEFT ANTECUBITAL Performed at [facility], [address] [place], [place] [address]    Special Requests   Final    BOTTLES DRAWN AEROBIC AND ANAEROBIC Blood Culture adequate volume Performed at [facility], [address], [place], [place] [address]    Culture  Setup Time   Final    GRAM NEGATIVE RODS AEROBIC AND ANEROBIC BOTTLES Gram Stain Report Called to,Read Back By and Verified With: LARIMORE, A. 12/24 @ 0740 BEARD, S.  CRITICAL RESULT CALLED TO, READ BACK BY AND VERIFIED WITH: PHARMD  Lucita Lora  [id number] 1921 FCP Performed at [facility], [address], [place], [place] [address]    Culture ESCHERICHIA COLI (A)  Final   Report Status 08/03/2020 FINAL  Final   Organism ID, Bacteria ESCHERICHIA COLI  Final      Susceptibility   Escherichia coli - MIC*    AMPICILLIN <=2 SENSITIVE Sensitive     CEFAZOLIN <=4 SENSITIVE Sensitive     CEFEPIME <=0.12 SENSITIVE Sensitive     CEFTAZIDIME <=1 SENSITIVE Sensitive     CEFTRIAXONE <=0.25 SENSITIVE Sensitive     CIPROFLOXACIN <=0.25 SENSITIVE Sensitive     GENTAMICIN <=1 SENSITIVE Sensitive     IMIPENEM <=0.25 SENSITIVE Sensitive     TRIMETH/SULFA <=20 SENSITIVE Sensitive     AMPICILLIN/SULBACTAM <=2 SENSITIVE Sensitive     PIP/TAZO <=4 SENSITIVE Sensitive     * ESCHERICHIA COLI  Blood Culture ID Panel (Reflexed)     Status: Abnormal   Collection Time: 07/31/20  5:26 PM  Result Value Ref Range Status   Enterococcus faecalis NOT DETECTED NOT DETECTED Final   Enterococcus Faecium NOT DETECTED NOT DETECTED Final   Listeria monocytogenes NOT DETECTED NOT DETECTED Final   Staphylococcus species NOT DETECTED NOT DETECTED Final   Staphylococcus aureus (BCID) NOT DETECTED NOT DETECTED Final   Staphylococcus epidermidis NOT DETECTED NOT DETECTED Final   Staphylococcus lugdunensis NOT DETECTED NOT DETECTED Final   Streptococcus species NOT DETECTED NOT  DETECTED Final   Streptococcus agalactiae NOT DETECTED NOT DETECTED Final   Streptococcus pneumoniae NOT DETECTED NOT DETECTED Final   Streptococcus pyogenes NOT DETECTED NOT DETECTED Final   A.calcoaceticus-baumannii NOT DETECTED NOT DETECTED Final   Bacteroides fragilis NOT DETECTED NOT DETECTED Final   Enterobacterales DETECTED (A) NOT DETECTED Final    Comment: Enterobacterales represent a large order of gram negative bacteria, not a single organism. CRITICAL RESULT CALLED TO, READ BACK BY AND VERIFIED WITH: PHARMD  TERRY G. [id number] 1921 FCP    Enterobacter cloacae complex NOT  DETECTED NOT DETECTED Final   Escherichia coli DETECTED (A) NOT DETECTED Final    Comment: CRITICAL RESULT CALLED TO, READ BACK BY AND VERIFIED WITH: PHARMD  TERRY G. [id number] 1921 FCP    Klebsiella aerogenes NOT DETECTED NOT DETECTED Final   Klebsiella oxytoca NOT DETECTED NOT DETECTED Final   Klebsiella pneumoniae NOT DETECTED NOT DETECTED Final   Proteus species NOT DETECTED NOT DETECTED Final   Salmonella species NOT DETECTED NOT DETECTED Final   Serratia marcescens NOT DETECTED NOT DETECTED Final   Haemophilus influenzae NOT DETECTED NOT DETECTED Final   Neisseria meningitidis NOT DETECTED NOT DETECTED Final   Pseudomonas aeruginosa NOT DETECTED NOT DETECTED Final   Stenotrophomonas maltophilia NOT DETECTED NOT DETECTED Final   Candida albicans NOT DETECTED NOT DETECTED Final   Candida auris NOT DETECTED NOT DETECTED Final   Candida glabrata NOT DETECTED NOT DETECTED Final   Candida krusei NOT DETECTED NOT DETECTED Final   Candida parapsilosis NOT DETECTED NOT DETECTED Final   Candida tropicalis NOT DETECTED NOT DETECTED Final   Cryptococcus neoformans/gattii NOT DETECTED NOT DETECTED Final   CTX-M ESBL NOT DETECTED NOT DETECTED Final   Carbapenem resistance IMP NOT DETECTED NOT DETECTED Final   Carbapenem resistance KPC NOT DETECTED NOT DETECTED Final   Carbapenem resistance NDM NOT DETECTED NOT DETECTED Final   Carbapenem resist OXA 48 LIKE NOT DETECTED NOT DETECTED Final   Carbapenem resistance VIM NOT DETECTED NOT DETECTED Final    Comment: Performed at [facility], [address], [place], [place] [address]  Resp Panel by RT-PCR (Flu A&B, Covid) Nasopharyngeal Swab     Status: None   Collection Time: 07/31/20  5:39 PM   Specimen: Nasopharyngeal Swab; Nasopharyngeal(NP) swabs in vial transport medium  Result Value Ref Range Status   SARS Coronavirus 2 by RT PCR NEGATIVE NEGATIVE Final    Comment: (NOTE) SARS-CoV-2 target nucleic acids are NOT DETECTED.  The  SARS-CoV-2 RNA is generally detectable in upper respiratory specimens during the acute phase of infection. The lowest concentration of SARS-CoV-2 viral copies this assay can detect is 138 copies/mL. A negative result does not preclude SARS-Cov-2 infection and should not be used as the sole basis for treatment or other patient management decisions. A negative result may occur with  improper specimen collection/handling, submission of specimen other than nasopharyngeal swab, presence of viral mutation(s) within the areas targeted by this assay, and inadequate number of viral copies(<138 copies/mL). A negative result must be combined with clinical observations, patient history, and epidemiological information. The expected result is Negative.  Fact Sheet for Patients:  BloggerCourse.com  Fact Sheet for Healthcare Providers:  SeriousBroker.it  This test is no t yet approved or cleared by the [place] FDA and  has been authorized for detection and/or diagnosis of SARS-CoV-2 by FDA under an Emergency Use Authorization (EUA). This EUA will remain  in effect (meaning this test can be used) for the  duration of the COVID-19 declaration under Section 564(b)(1) of the Act, 21 U.S.C.section 360bbb-3(b)(1), unless the authorization is terminated  or revoked sooner.       Influenza A by PCR NEGATIVE NEGATIVE Final   Influenza B by PCR NEGATIVE NEGATIVE Final    Comment: (NOTE) The Xpert Xpress SARS-CoV-2/FLU/RSV plus assay is intended as an aid in the diagnosis of influenza from Nasopharyngeal swab specimens and should not be used as a sole basis for treatment. Nasal washings and aspirates are unacceptable for Xpert Xpress SARS-CoV-2/FLU/RSV testing.  Fact Sheet for Patients: BloggerCourse.com  Fact Sheet for Healthcare Providers: SeriousBroker.it  This test is not yet approved or  cleared by the [place] FDA and has been authorized for detection and/or diagnosis of SARS-CoV-2 by FDA under an Emergency Use Authorization (EUA). This EUA will remain in effect (meaning this test can be used) for the duration of the COVID-19 declaration under Section 564(b)(1) of the Act, 21 U.S.C. section 360bbb-3(b)(1), unless the authorization is terminated or revoked.  Performed at [facility], [address], [place], [place] [address]   Culture, blood (single)     Status: None   Collection Time: 07/31/20  8:23 PM   Specimen: BLOOD RIGHT WRIST  Result Value Ref Range Status   Specimen Description BLOOD RIGHT WRIST  Final   Special Requests   Final    BOTTLES DRAWN AEROBIC AND ANAEROBIC Blood Culture results may not be optimal due to an excessive volume of blood received in culture bottles   Culture   Final    NO GROWTH 5 DAYS Performed at [facility], [address], [place], [place] [address]    Report Status 08/05/2020 FINAL  Final  Urine Culture     Status: Abnormal   Collection Time: 08/01/20 11:28 AM   Specimen: Urine, Clean Catch  Result Value Ref Range Status   Specimen Description   Final    URINE, CLEAN CATCH Performed at [facility], [address], [place], [place] [address]    Special Requests   Final    NONE Performed at [facility], [address] [place], [place] [address]    Culture (A)  Final    <10,000 COLONIES/mL INSIGNIFICANT GROWTH Performed at [facility], [address] [place], [place] [address]    Report Status 08/02/2020 FINAL  Final  MRSA PCR Screening     Status: None   Collection Time: 08/01/20  3:26 PM   Specimen: Nasopharyngeal  Result Value Ref Range Status   MRSA by PCR NEGATIVE NEGATIVE Final    Comment:        The GeneXpert MRSA Assay (FDA approved for NASAL specimens only), is one component of a comprehensive MRSA colonization surveillance program. It is not intended to diagnose MRSA infection nor to guide  or monitor treatment for MRSA infections. Performed at [facility], [address], [place], [place] [address]   Aerobic/Anaerobic Culture (surgical/deep wound)     Status: None   Collection Time: 08/01/20  7:37 PM   Specimen: Urine, Catheterized  Result Value Ref Range Status   Specimen Description   Final    URINE, CATHETERIZED URINE FROM DRAIN Performed at [facility], [address] [place], [place] [address]    Special Requests   Final    NONE Performed at [facility], [address] [place], [place] [address]    Gram Stain   Final    ABUNDANT WBC PRESENT, PREDOMINANTLY  PMN RARE GRAM NEGATIVE RODS RARE GRAM POSITIVE RODS    Culture   Final    FEW ESCHERICHIA COLI NO ANAEROBES ISOLATED Performed at [facility], [address] [place], [place] [address]    Report Status 08/07/2020 FINAL  Final   Organism ID, Bacteria ESCHERICHIA COLI  Final      Susceptibility   Escherichia coli - MIC*    AMPICILLIN 4 SENSITIVE Sensitive     CEFAZOLIN <=4 SENSITIVE Sensitive     CEFEPIME <=0.12 SENSITIVE Sensitive     CEFTRIAXONE <=0.25 SENSITIVE Sensitive     CIPROFLOXACIN <=0.25 SENSITIVE Sensitive     GENTAMICIN <=1 SENSITIVE Sensitive     IMIPENEM <=0.25 SENSITIVE Sensitive     NITROFURANTOIN <=16 SENSITIVE Sensitive     TRIMETH/SULFA <=20 SENSITIVE Sensitive     AMPICILLIN/SULBACTAM <=2 SENSITIVE Sensitive     PIP/TAZO <=4 SENSITIVE Sensitive     * FEW ESCHERICHIA COLI  Aerobic/Anaerobic Culture (surgical/deep wound)     Status: None   Collection Time: 08/01/20  7:38 PM   Specimen: Abscess  Result Value Ref Range Status   Specimen Description   Final    ABSCESS Performed at [facility], [address] [place], [place] [address]    Special Requests   Final    NONE Performed at [facility], [address] [place], [place] [address]    Gram Stain   Final     ABUNDANT WBC PRESENT, PREDOMINANTLY PMN MODERATE GRAM NEGATIVE RODS    Culture   Final    ABUNDANT ESCHERICHIA COLI NO ANAEROBES ISOLATED Performed at [facility], [address] [place], [place] [address]    Report Status 08/07/2020 FINAL  Final   Organism ID, Bacteria ESCHERICHIA COLI  Final      Susceptibility   Escherichia coli - MIC*    AMPICILLIN <=2 SENSITIVE Sensitive     CEFAZOLIN <=4 SENSITIVE Sensitive     CEFEPIME <=0.12 SENSITIVE Sensitive     CEFTAZIDIME <=1 SENSITIVE Sensitive     CEFTRIAXONE <=0.25 SENSITIVE Sensitive     CIPROFLOXACIN <=0.25 SENSITIVE Sensitive     GENTAMICIN <=1 SENSITIVE Sensitive     IMIPENEM <=0.25 SENSITIVE Sensitive     TRIMETH/SULFA <=20 SENSITIVE Sensitive     AMPICILLIN/SULBACTAM <=2 SENSITIVE Sensitive     PIP/TAZO <=4 SENSITIVE Sensitive     * ABUNDANT ESCHERICHIA COLI    Studies/Results: DG Chest 2 View  Result Date: 08/07/2020 CLINICAL DATA:  Sepsis.  Left pyelonephritis. EXAM: CHEST - 2 VIEW COMPARISON:  08/04/2020 FINDINGS: Stable cardiomegaly. Small left pleural effusion and left basilar atelectasis or infiltrate show no significant change. Probable small layering right pleural effusion also noted. Increased airspace opacity is seen in the right upper lobe since prior study. IMPRESSION: Increased right upper lobe airspace opacity. Stable small left pleural effusion, and left basilar atelectasis versus infiltrate. Probable small layering right pleural effusion. Electronically Signed   By: Danae OrleansJohn A Stahl M.D.   On: 08/07/2020 12:54   CT CHEST ABDOMEN PELVIS W CONTRAST  Result Date: 08/08/2020 CLINICAL DATA:  60 year old female with history of complicated pyelonephritis. EXAM: CT CHEST, ABDOMEN, AND PELVIS WITH CONTRAST TECHNIQUE: Multidetector CT imaging of the chest, abdomen and pelvis was performed following the standard protocol during bolus administration of intravenous contrast. CONTRAST:  100mL OMNIPAQUE IOHEXOL 300 MG/ML   SOLN COMPARISON:  CT the chest, abdomen and pelvis 08/01/2020. FINDINGS: CT CHEST FINDINGS Cardiovascular: Heart size is normal. There  is no significant pericardial fluid, thickening or pericardial calcification. There is aortic atherosclerosis, as well as atherosclerosis of the great vessels of the mediastinum and the coronary arteries, including calcified atherosclerotic plaque in the left anterior descending coronary artery. Dilatation of the pulmonic trunk (3.6 cm in diameter). Mediastinum/Nodes: No pathologically enlarged in mediastinal or hilar lymph nodes. Esophagus is unremarkable in appearance. No axillary lymphadenopathy. Lungs/Pleura: Numerous pulmonary nodules and masses are again noted throughout the lungs bilaterally, many of which have central areas of cavitation. The largest of these is in the posterior aspect of the right upper lobe (axial image 54 of series 5) measuring 3.2 x 3.2 cm, with central low attenuation regions and internal cavitation, compatible with a large intrapulmonary abscess. Overall, findings are strongly suggestive of widespread septic emboli. Moderate bilateral pleural effusions lying dependently with extensive passive atelectasis in the lower lobes of the lungs bilaterally. Musculoskeletal: There are no aggressive appearing lytic or blastic lesions noted in the visualized portions of the skeleton. CT ABDOMEN PELVIS FINDINGS Hepatobiliary: No suspicious cystic or solid hepatic lesions. No intra or extrahepatic biliary ductal dilatation. Gallbladder is normal in appearance. Pancreas: No pancreatic mass. No pancreatic ductal dilatation. No peripancreatic fluid collections or inflammatory changes. Spleen: Unremarkable. Adrenals/Urinary Tract: New nephrostomy tube extends into the left renal pelvis and appears appropriately located. The left kidneys enlarged and very heterogeneous in appearance with multiple hypovascular hypoenhancing regions, compatible with residual areas of  pyelonephritis and associated renal and perinephric abscesses. These extend into the perinephric space where there is a combination of gas and fluid. Some of this tracks caudally where there is a larger collection of predominantly fluid, trace amount of gas, and some higher attenuation material adjacent to the tip of an indwelling drain (best appreciated on axial image 89 of series 2 and coronal image 74 of series 6) which measures approximately 4.3 x 2.1 x 3.6 cm. This perinephric fluid collection trucks caudally into the left hemipelvis along the left pelvic sidewall, coming into contact with the left iliacus musculature. Overall, the extent of fluid in this collection appears increased. This is well demonstrated in a relatively well-defined portion of the collection laterally which currently measures 9.5 x 3.6 cm (axial image 78 of series 2) and previously measured only 8.2 x 2.4 cm when measured in a similar fashion on 08/01/2020. Right kidney and bilateral adrenal glands are normal in appearance. No right hydroureteronephrosis. Urinary bladder is completely decompressed around an indwelling Foley balloon catheter. Small amount of gas present in the lumen of the urinary bladder is presumably iatrogenic. Stomach/Bowel: The appearance of the stomach is normal. There is no pathologic dilatation of small bowel or colon. Normal appendix. Vascular/Lymphatic: Aortic atherosclerosis, without evidence of aneurysm or dissection in the abdominal or pelvic vasculature. Filling defect in the left renal vein best appreciated on coronal image 77 of series 6, compatible with nonocclusive left renal vein thrombus. No lymphadenopathy noted in the abdomen or pelvis. Reproductive: Uterus and ovaries are unremarkable in appearance. Other: Diffuse body wall edema. Trace volume of ascites. No pneumoperitoneum. Musculoskeletal: There are no aggressive appearing lytic or blastic lesions noted in the visualized portions of the skeleton.  IMPRESSION: 1. Compared to the prior study there is been interval nephrostomy tube placement which appears appropriately located, as well as percutaneous drainage catheter placement in the inferior aspect of the patient's extensive perinephric abscess. Unfortunately, the overall size of the abscess appears increased compared to the prior examination, and there is new high attenuation fluid within  the inferior aspect of this collection, which could represent some recent hemorrhage. If there is concern for active hemorrhage, further evaluation with triple-phase CT of the abdomen and pelvis should be considered to evaluate for source of active bleeding. 2. Numerous large pulmonary nodules and masses in the lungs bilaterally, with imaging characteristics most compatible with extensive septic emboli with multifocal pulmonary abscess formation. 3. 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 This likely represents the source of emboli in this patient with evidence of septic embolization. 4. Moderate bilateral pleural effusions lying dependently with associated areas of passive atelectasis in the lower lobes of the lungs bilaterally. 5. Dilatation of the pulmonic trunk (3.6 cm in diameter), concerning for pulmonary arterial hypertension. 6. Additional incidental findings, as above. These results were called by telephone at the time of interpretation on 08/08/2020 at 5:38 pm to provider [facility], who verbally acknowledged these results. Electronically Signed   By: Vinnie Langton M.D.   On: 08/08/2020 17:38   DG Abd 2 Views  Result Date: 08/08/2020 CLINICAL DATA:  Abdominal pain and decreased oral intake. EXAM: ABDOMEN - 2 VIEW COMPARISON:  Plain film of the abdomen dated 08/07/2020. FINDINGS: Mildly distended small bowel within the central abdomen with nonspecific air-fluid levels in the upper pelvis. Air is again seen within the  colon. No evidence of renal or ureteral calculi. Two percutaneous pigtail catheters are again seen within the LEFT abdomen. Dense consolidation the LEFT lung base, presumed pneumonia. IMPRESSION: 1. Mildly distended small bowel within the central abdomen with nonspecific air-fluid levels in the upper pelvis. Findings could represent ileus or partial small bowel obstruction. 2. LEFT lower lung consolidation, pneumonia versus atelectasis. Electronically Signed   By: Franki Cabot M.D.   On: 08/08/2020 10:04   DG Abd 2 Views  Result Date: 08/07/2020 CLINICAL DATA:  Sepsis.  Left pyelonephritis. EXAM: ABDOMEN - 2 VIEW COMPARISON:  None. FINDINGS: Mild gaseous distention of colon is seen predominately involving the nondependent sigmoid colon. Scattered air-fluid levels are seen, however there is no evidence of dilated small bowel loops. These findings are likely due to mild ileus. No radiopaque urinary calculi identified. Two percutaneous pigtail catheters are seen in the left upper and lower quadrants. IMPRESSION: Probable mild adynamic ileus. Two percutaneous pigtail catheters in left upper and lower quadrants. Electronically Signed   By: Marlaine Hind M.D.   On: 08/07/2020 12:51    Assessment/Plan 1. 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 UCX pan-sensitive e .coli. Now on ancef. Also severe DM with improving hyperglyemia. Left nephrostoy and perinephric drains placed 12/25. WBC peak 49 now trending down / stable .CT showing some increase in size of abscess but drain in good position and patient clinically improved and WBC count now 10.6.  Drain is draining well. recommend following clinically now, may need repeat CT Monday to reevaluate for progression of abscess. ? Need for additional perc drain if abscess enlarging or see clinical worsening.   2. UPJ calculus with mild obstructive uropathy and AKI - 58mm left prox ureteral stone by ER CT 12/24. Temporized  with left neph tube. Cr peak 3's, now <1.03.      LOS: 9 days   Remi Haggard 08/09/2020, 8:15 AM

## 2020-08-09 NOTE — Progress Notes (Signed)
PROGRESS NOTE    Tammie [name]  ZOX:[id number] DOB: 07/13/1961 DOA: 07/31/2020 PCP: Patient, No Pcp Per    Chief Complaint  Patient presents with  . Abdominal Pain    Brief Narrative: 60 year old female admitted 12/24 with chief complaint of abdominal pain.  Found to have acute left pyelonephrosis with abscess, severe sepsis, acute kidney injury, hyper osmolar nonketotic hyperglycemia, severe hyponatremia  -CT imaging showed multiple pulmonary nodules, imaging of abdomen pelvis With left hydronephrosis and evidence of perinephritic abscess Was  transferred to [facility] for placement of left nephrostomy tube and left perirenal drain Critical care asked to admit given severe hyponatremia and concern for clinical decompensation   Assessment & Plan:   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 with type 2 diabetes mellitus (HCC)   Ileus (HCC)  1 severe sepsis with E. coli bacteremia/left emphysematous pyelonephritis with perinephric abscess Patient noted on admission to meet criteria for sepsis with 44,000, glucose level of 700s, nonanion gap metabolic acidosis, acute renal failure, severe hyponatremia with a sodium level as low as 101 admitted to the critical care service.  Patient status post percutaneous nephrostomy and perirenal abscess drainage 08/01/2020.  Patient improving clinically.  Leukocytosis trending down.  Patient afebrile.  Patient currently was on IV antibiotics that was narrowed down to IV Ancef.  Urology  following and repeat CT abdomen and pelvis which was done negative for ileus however did show worsening perinephric abscesses, ??  Hemorrhage, complicated emphysematous left-sided pyelonephritis with nonocclusive thrombus forming in the left renal vein likely represents source of emboli.  As patient clinically improving urology recommending to continue current regimen of IV antibiotics, supportive care and possibly repeat CT abdomen and pelvis in the next few days if renal function remains stable.  CT chest concerning for numerous large pulmonary nodules or masses bilaterally compatible with extensive septic emboli with multifocal pulmonary abscess formation.  Due to worsening imaging on chest and abdomen/pelvis will consult with ID for further evaluation and management.  Continue IV Ancef.  Continue Foley catheter.  IR following and managing drains.  Urology following and appreciate input and recommendations.     2.  Acute renal failure Questionable etiology.  Patient has not had medical care in about 30 years and as such underlying baseline renal function unknown.  Patient on admission was seen in consultation by nephrology.  Is felt patient's acute renal failure likely multifactorial secondary to emphysematous pyelonephritis with abscess, ischemic ATN in the setting of volume depletion.  Patient status post percutaneous nephrostomy and perirenal abscess drainage.  Foley catheter in place.  Urine output of 2.7 L over the past 24 hours.  Renal function improved and creatinine down to 1.03.  Repeat CT abdomen and pelvis with worsening abdominal abscesses however patient improving clinically.  Urology following.   3.  Abdominal discomfort Patient with some complaints of abdominal discomfort could likely be secondary to problem #1.  Patient with abdominal distention noted on examination.  Patient did state had some loose bowel movements.  Abdominal  films obtained concerning for possible ileus.  CT abdomen and  pelvis obtained negative for ileus however did show worsening perinephric abscesses, ??  Hemorrhage, complicated emphysematous left-sided pyelonephritis with nonocclusive thrombus forming in the left renal vein likely represents source of emboli.  Patient noted to have had a large bowel movement today with clinical improvement.  Patient afebrile.  Leukocytosis trending down.  Clinically patient improving per urology who are recommending monitoring for now and probable repeat CT in a few days.  Patient started on clears and will advance as tolerated to carb modified diet.  Supportive care.  Follow.   4.  Hypokalemia/hypomagnesemia Magnesium level at 1.6.  Potassium at 3.6.  Magnesium sulfate 4 g IV x1.  KCl 10 mEq IV every 1 hour x4 doses.  Keep potassium  > 4.  Keep magnesium  > 2.   5.  Newly diagnosed diabetes mellitus type 2/HHNK Hemoglobin A1c noted at 12.2.  Initially was on Lantus 10 units twice daily.  CBG 117 this morning.  Patient on clears and being advanced to a carb modified diet.  Continue Lantus 8 units twice daily.  Sliding scale insulin.  Diabetic coordinator following.   6.  Severe hyponatremia Questionable etiology.  Initially treated with 3% normal saline which has subsequently been discontinued.  Patient was switched to isotonic bicarb and changed to LR 08/05/2020.  Sodium level normalized.  Saline lock IV fluids.  Follow.  6.  Multiple pulmonary nodules Differential includes septic emboli versus metastatic malignancy. Once renal function improves urology recommending repeat CT with IV contrast.  Pulmonary recommended when repeat CT is done to get a repeat chest at that same time.  Patient will need outpatient follow-up with pulmonary.  CT chest done with numerous large pulmonary nodules and masses bilaterally in the lungs with characteristics compatible with septic emboli and multiple pulmonary abscess formation, moderate bilateral pleural effusions.Case discussed with PCCM who  reviewed CT findings and when agreement secondary to septic emboli recommended continuation of IV antibiotics with close outpatient follow-up with pulmonary.  Per pulmonary patient will need repeat CT chest done in approximately 6 weeks.   7.  Bibasilar crackles/moderate bilateral pleural effusion Noted on physical exam.  Chest x-ray done with increased right upper lobe airspace opacity, stable small left pleural effusion and left basilar atelectasis versus infiltrate.  Probable small layering right pleural effusion.  CT chest done with moderate bilateral pleural effusions.  Will saline lock IV fluids.  Trial of Lasix 40 mg IV every 12 hours x2 doses.  Strict I's and O's.  Daily weights.  Monitor renal function closely.    8. ??  Ileus Patient during the hospitalization had some diffuse abdominal pain.  Abdominal films which were done were concerning for ileus.  Electrolytes were repleted.  CT abdomen and pelvis which was done negative for ileus.  Patient having bowel movements and passing flatus.  Patient started on clears and would advance to a carb modified diet.     DVT prophylaxis: Heparin Code Status: Full Family Communication: Updated patient and husband at bedside. Disposition:   Status is: Inpatient    Dispo: The patient is from: Home              Anticipated d/c is to: To be determined              Anticipated d/c date is: To be determined.              Patient currently on IV antibiotics, has percutaneous  nephrostomy drains, not stable for discharge.       Consultants:   Urology: Dr. Arita MissPace 08/01/2020  Nephrology: Dr. Arrie Aranoladonato 08/01/2020  General surgery: Dr. Henreitta LeberBridges 08/01/2020([facility])   Significant Hospital Events:  12/24 admitted with chief complaint of abdominal pain.  Diagnostic imaging from CT abdomen pelvis showing left hydronephrosis, pyelonephritis, and perinephritic abscess.  Blood cultures preliminarily showing gram-negative rods, CT chest with  multiple pulmonary nodules.Presenting blood glucose 706, presenting sodium 1 less than 102.  Beta hydroxybutyric acid was negative.  Was given IV hydration.  Started on empiric antibiotics.  Transferred to [facility] following urology consultation recommending left nephrostomy tube and perinephritic drain.  Critical care asked to admit: Left PCN in left perirenal abscess drain both placed on 12/24 12/25 Sodium improved from 10 2-1 1 4   , 3% saline stopped at 3 AM, placed on bicarbonate infusion. Off pressors 12/26-12/29: 12/27: Switched to Ancef given sensitivities bicarb continued. 12/28: Bicarbonate changed to LR 12/29: Doing better.  Transferred to [facility] hospitalist for pickup 08/07/2020   Procedures:  12/24 IR >> CT guided perc nephrostomy ,CT guided L pelvic abscess drain placement    Significant Diagnostic Tests:  CT chest abdomen and pelvis 12/24: Multiple bilateral pulmonary nodules up to 12 mm in diameter, they are ill-defined and shaggy in appearance 4 mm left UPJ calculus with mild left hydronephrosis and hydroureter the left kidney is emphysematous with a 6.4 x 3.6 x 5.9 diameter abscess CT head: Negative  abdominal films 08/07/2020, 08/08/2020 CT chest abdomen and pelvis 08/08/2020    Antimicrobials:  Azithromycin and ceftriaxone 12/23 for 1 dose Zosyn 12/24>> 12/25 12/25 ceftx >> 12/27  Ancef 12/28 >    Micro Data:  Urine culture 12/23 Blood culture x2 was 12/23: E coli  Abscess cx 12/24 >> GNR >> abundant E. coli Respiratory panel by PCR 12/23: Negative for Covid and influenza. Catheterized urine from drain 08/01/2020>>>> E. coli     Subjective: Patient stated had a large bowel movement last night and feels much better.  Denies any further abdominal pain.  Denies any significant shortness of breath.  Denies any chest pain.  Stated broth soup caused abdominal cramping however no further cramping today.    Objective: Vitals:   08/08/20 1857 08/08/20 2051 08/09/20  0514 08/09/20 1230  BP: (!) 153/82 136/69 (!) 142/80 124/60  Pulse: 94 88 83 89  Resp: 16 18 19 20   Temp: 98.3 F (36.8 C) 98.5 F (36.9 C) 97.6 F (36.4 C) 98.3 F (36.8 C)  TempSrc: Oral Oral Oral Oral  SpO2: 97% 96% 97% 96%  Weight:      Height:        Intake/Output Summary (Last 24 hours) at 08/09/2020 1316 Last data filed at 08/09/2020 1300 Gross per 24 hour  Intake 1437.99 ml  Output 3415 ml  Net -1977.01 ml   Filed Weights   07/31/20 1702 08/01/20 0000 08/07/20 1100  Weight: 79.4 kg 81.1 kg 80.6 kg    Examination:  General exam: NAD Respiratory system: Decreased breath sounds in the bases.  Fair air movement.  Speaking in full sentences.  No significant rhonchi.  Cardiovascular system: Regular rate rhythm no murmurs rubs or gallops.  No JVD.  No lower extremity edema.  Gastrointestinal system: Abdomen is soft, mildly distended, nontender to palpation, positive bowel sounds.  No rebound.  No guarding.  Central nervous system: Alert and oriented. No focal neurological deficits. Extremities: Symmetric 5 x 5 power. Skin: No rashes, lesions or  ulcers Psychiatry: Judgement and insight appear normal. Mood & affect appropriate.     Data Reviewed: I have personally reviewed following labs and imaging studies  CBC: Recent Labs  Lab 08/03/20 0412 08/04/20 0745 08/05/20 0750 08/06/20 0220 08/07/20 0506 08/08/20 0823 08/09/20 0528  WBC 30.3*   < > 16.0* 15.5* 15.2* 11.1* 10.6*  NEUTROABS 27.3*  --   --   --   --  9.1* 8.9*  HGB 10.5*   < > 10.4* 9.6* 10.6* 9.9* 9.0*  HCT 28.3*   < > 30.5* 29.6* 33.0* 30.8* 28.0*  MCV 83.7   < > 89.4 92.8 95.1 94.5 96.2  PLT 273   < > 315 308 317 327 297   < > = values in this interval not displayed.    Basic Metabolic Panel: Recent Labs  Lab 08/03/20 0445 08/03/20 1319 08/05/20 0750 08/06/20 0220 08/07/20 0506 08/07/20 0758 08/08/20 0502 08/08/20 0823 08/09/20 0528  NA 124*   < > 133* 135 135  --   --  138 137  K 2.7*    < > 3.1* 3.8 3.2*  --   --  3.1* 3.6  CL 87*   < > 89* 94* 94*  --   --  97* 100  CO2 20*   < > 30 29 29   --   --  30 28  GLUCOSE 92   < > 146* 87 113*  --   --  80 126*  BUN 103*   < > 74* 56* 41*  --   --  32* 24*  CREATININE 3.26*   < > 1.84* 1.61* 1.27*  --   --  0.98 1.03*  CALCIUM 7.7*   < > 8.1* 8.1* 8.3*  --   --  8.1* 8.0*  MG 2.5*   < > 1.8 1.6*  --  1.6* 2.0  --  1.6*  PHOS 6.7*  --  4.1 3.4  --   --   --  2.8 2.9   < > = values in this interval not displayed.    GFR: Estimated Creatinine Clearance: 57.8 mL/min (A) (by C-G formula based on SCr of 1.03 mg/dL (H)).  Liver Function Tests: Recent Labs  Lab 08/08/20 0823 08/09/20 0528  AST  --  22  ALT  --  8  ALKPHOS  --  165*  BILITOT  --  0.7  PROT  --  5.1*  ALBUMIN 2.0* 2.0*    CBG: Recent Labs  Lab 08/07/20 2240 08/08/20 1653 08/08/20 2050 08/09/20 0739 08/09/20 1119  GLUCAP 122* 78 75 117* 230*     Recent Results (from the past 240 hour(s))  Culture, blood (single)     Status: Abnormal   Collection Time: 07/31/20  5:26 PM   Specimen: BLOOD  Result Value Ref Range Status   Specimen Description   Final    BLOOD LEFT ANTECUBITAL Performed at [facility], [address], [place], [place] [address]    Special Requests   Final    BOTTLES DRAWN AEROBIC AND ANAEROBIC Blood Culture adequate volume Performed at [facility], [address] [place], [place] [address]    Culture  Setup Time   Final    GRAM NEGATIVE RODS AEROBIC AND ANEROBIC BOTTLES Gram Stain Report Called to,Read Back By and Verified With: LARIMORE, A. 12/24 @ 0740 BEARD, S.  CRITICAL RESULT CALLED TO, READ BACK BY AND VERIFIED WITH: PHARMD  Mitzie Na [phone] FCP Performed at [facility], [address] [place], [place] [address]    Culture ESCHERICHIA COLI (A)  Final   Report Status 08/03/2020 FINAL  Final   Organism ID, Bacteria ESCHERICHIA COLI  Final      Susceptibility   Escherichia coli - MIC*    AMPICILLIN <=2  SENSITIVE Sensitive     CEFAZOLIN <=4 SENSITIVE Sensitive     CEFEPIME <=0.12 SENSITIVE Sensitive     CEFTAZIDIME <=1 SENSITIVE Sensitive     CEFTRIAXONE <=0.25 SENSITIVE Sensitive     CIPROFLOXACIN <=0.25 SENSITIVE Sensitive     GENTAMICIN <=1 SENSITIVE Sensitive     IMIPENEM <=0.25 SENSITIVE Sensitive     TRIMETH/SULFA <=20 SENSITIVE Sensitive     AMPICILLIN/SULBACTAM <=2 SENSITIVE Sensitive     PIP/TAZO <=4 SENSITIVE Sensitive     * ESCHERICHIA COLI  Blood Culture ID Panel (Reflexed)     Status: Abnormal   Collection Time: 07/31/20  5:26 PM  Result Value Ref Range Status   Enterococcus faecalis NOT DETECTED NOT DETECTED Final   Enterococcus Faecium NOT DETECTED NOT DETECTED Final   Listeria monocytogenes NOT DETECTED NOT DETECTED Final   Staphylococcus species NOT DETECTED NOT DETECTED Final   Staphylococcus aureus (BCID) NOT DETECTED NOT DETECTED Final   Staphylococcus epidermidis NOT DETECTED NOT DETECTED Final   Staphylococcus lugdunensis NOT DETECTED NOT DETECTED Final   Streptococcus species NOT DETECTED NOT DETECTED Final   Streptococcus agalactiae NOT DETECTED NOT DETECTED Final   Streptococcus pneumoniae NOT DETECTED NOT DETECTED Final   Streptococcus pyogenes NOT DETECTED NOT DETECTED Final   A.calcoaceticus-baumannii NOT DETECTED NOT DETECTED Final   Bacteroides fragilis NOT DETECTED NOT DETECTED Final   Enterobacterales DETECTED (A) NOT DETECTED Final    Comment: Enterobacterales represent a large order of gram negative bacteria, not a single organism. CRITICAL RESULT CALLED TO, READ BACK BY AND VERIFIED WITH: PHARMD  TERRY G. [id number] FCP    Enterobacter cloacae complex NOT DETECTED NOT DETECTED Final   Escherichia coli DETECTED (A) NOT DETECTED Final    Comment: CRITICAL RESULT CALLED TO, READ BACK BY AND VERIFIED WITH: PHARMD  TERRY G. [id number] 1921 FCP    Klebsiella aerogenes NOT DETECTED NOT DETECTED Final   Klebsiella oxytoca NOT DETECTED NOT DETECTED Final    Klebsiella pneumoniae NOT DETECTED NOT DETECTED Final   Proteus species NOT DETECTED NOT DETECTED Final   Salmonella species NOT DETECTED NOT DETECTED Final   Serratia marcescens NOT DETECTED NOT DETECTED Final   Haemophilus influenzae NOT DETECTED NOT DETECTED Final   Neisseria meningitidis NOT DETECTED NOT DETECTED Final   Pseudomonas aeruginosa NOT DETECTED NOT DETECTED Final   Stenotrophomonas maltophilia NOT DETECTED NOT DETECTED Final   Candida albicans NOT DETECTED NOT DETECTED Final   Candida auris NOT DETECTED NOT DETECTED Final   Candida glabrata NOT DETECTED NOT DETECTED Final   Candida krusei NOT DETECTED NOT DETECTED Final   Candida parapsilosis NOT DETECTED NOT DETECTED Final   Candida tropicalis NOT DETECTED NOT DETECTED Final   Cryptococcus neoformans/gattii NOT DETECTED NOT DETECTED Final   CTX-M ESBL NOT DETECTED NOT DETECTED Final   Carbapenem resistance IMP NOT DETECTED NOT DETECTED Final   Carbapenem resistance KPC NOT DETECTED NOT DETECTED Final   Carbapenem resistance NDM NOT DETECTED NOT DETECTED Final   Carbapenem resist OXA 48 LIKE NOT DETECTED NOT DETECTED Final   Carbapenem resistance VIM NOT DETECTED NOT DETECTED Final    Comment: Performed at [facility], [address] [place], [place] [address]  Resp Panel by RT-PCR (Flu A&B, Covid) Nasopharyngeal Swab     Status: None   Collection Time: 07/31/20  5:39 PM   Specimen: Nasopharyngeal Swab; Nasopharyngeal(NP) swabs in vial transport medium  Result Value Ref Range Status   SARS Coronavirus 2 by RT PCR NEGATIVE NEGATIVE Final    Comment: (NOTE) SARS-CoV-2 target nucleic acids are NOT DETECTED.  The SARS-CoV-2 RNA is generally detectable in upper respiratory specimens during the acute phase of infection. The lowest concentration of SARS-CoV-2 viral copies this assay can detect is 138 copies/mL. A negative result does not preclude SARS-Cov-2 infection and should not be used as the sole basis for  treatment or other patient management decisions. A negative result may occur with  improper specimen collection/handling, submission of specimen other than nasopharyngeal swab, presence of viral mutation(s) within the areas targeted by this assay, and inadequate number of viral copies(<138 copies/mL). A negative result must be combined with clinical observations, patient history, and epidemiological information. The expected result is Negative.  Fact Sheet for Patients:  BloggerCourse.com  Fact Sheet for Healthcare Providers:  SeriousBroker.it  This test is no t yet approved or cleared by the [place] FDA and  has been authorized for detection and/or diagnosis of SARS-CoV-2 by FDA under an Emergency Use Authorization (EUA). This EUA will remain  in effect (meaning this test can be used) for the duration of the COVID-19 declaration under Section 564(b)(1) of the Act, 21 U.S.C.section 360bbb-3(b)(1), unless the authorization is terminated  or revoked sooner.       Influenza A by PCR NEGATIVE NEGATIVE Final   Influenza B by PCR NEGATIVE NEGATIVE Final    Comment: (NOTE) The Xpert Xpress SARS-CoV-2/FLU/RSV plus assay is intended as an aid in the diagnosis of influenza from Nasopharyngeal swab specimens and should not be used as a sole basis for treatment. Nasal washings and aspirates are unacceptable for Xpert Xpress SARS-CoV-2/FLU/RSV testing.  Fact Sheet for Patients: BloggerCourse.com  Fact Sheet for Healthcare Providers: SeriousBroker.it  This test is not yet approved or cleared by the [place] FDA and has been authorized for detection and/or diagnosis of SARS-CoV-2 by FDA under an Emergency Use Authorization (EUA). This EUA will remain in effect (meaning this test can be used) for the duration of the COVID-19 declaration under Section 564(b)(1) of the Act, 21  U.S.C. section 360bbb-3(b)(1), unless the authorization is terminated or revoked.  Performed at [facility], [address], [place], [place] [address]   Culture, blood (single)     Status: None   Collection Time: 07/31/20  8:23 PM   Specimen: BLOOD RIGHT WRIST  Result Value Ref Range Status   Specimen Description BLOOD RIGHT WRIST  Final   Special Requests   Final    BOTTLES DRAWN AEROBIC AND ANAEROBIC Blood Culture results may not be optimal due to an excessive volume of blood received in culture bottles   Culture   Final    NO GROWTH 5 DAYS Performed at [facility], [address] [place], [place] [address]    Report Status 08/05/2020 FINAL  Final  Urine Culture     Status: Abnormal   Collection Time: 08/01/20 11:28 AM   Specimen: Urine, Clean Catch  Result Value Ref Range Status   Specimen Description   Final    URINE, CLEAN CATCH Performed at [facility], [address], [place], [place] [address]    Special Requests   Final    NONE Performed at [facility], [address], [place], [place] [address]    Culture (A)  Final    <10,000 COLONIES/mL INSIGNIFICANT GROWTH Performed at [facility], [address], [place], [place] [address]    Report Status 08/02/2020 FINAL  Final  MRSA PCR Screening     Status: None   Collection Time: 08/01/20  3:26 PM   Specimen: Nasopharyngeal  Result Value Ref Range Status   MRSA by PCR NEGATIVE NEGATIVE Final    Comment:        The GeneXpert MRSA Assay (FDA approved for NASAL specimens only), is one component of a comprehensive MRSA colonization surveillance program. It is not intended to diagnose MRSA infection nor to guide or monitor treatment for MRSA infections. Performed at [facility], [address], [place], [place] [address]   Aerobic/Anaerobic Culture (surgical/deep wound)     Status: None   Collection Time: 08/01/20  7:37 PM   Specimen: Urine, Catheterized  Result Value Ref Range Status    Specimen Description   Final    URINE, CATHETERIZED URINE FROM DRAIN Performed at [facility], [address], [place], [place] [address]    Special Requests   Final    NONE Performed at [facility], [address], [place], [place] [address]    Gram Stain   Final    ABUNDANT WBC PRESENT, PREDOMINANTLY PMN RARE GRAM NEGATIVE RODS RARE GRAM POSITIVE RODS    Culture   Final    FEW ESCHERICHIA COLI NO ANAEROBES ISOLATED Performed at [facility], [address], [place], [place] [address]    Report Status 08/07/2020 FINAL  Final   Organism ID, Bacteria ESCHERICHIA COLI  Final      Susceptibility   Escherichia coli - MIC*    AMPICILLIN 4 SENSITIVE Sensitive     CEFAZOLIN <=4 SENSITIVE Sensitive     CEFEPIME <=0.12 SENSITIVE Sensitive     CEFTRIAXONE <=0.25 SENSITIVE Sensitive     CIPROFLOXACIN <=0.25 SENSITIVE Sensitive     GENTAMICIN <=1 SENSITIVE Sensitive     IMIPENEM <=0.25 SENSITIVE Sensitive     NITROFURANTOIN <=16 SENSITIVE Sensitive     TRIMETH/SULFA <=20 SENSITIVE Sensitive     AMPICILLIN/SULBACTAM <=2 SENSITIVE Sensitive     PIP/TAZO <=4 SENSITIVE Sensitive     * FEW ESCHERICHIA COLI  Aerobic/Anaerobic Culture (surgical/deep wound)     Status: None   Collection Time: 08/01/20  7:38 PM   Specimen: Abscess  Result Value Ref Range Status   Specimen Description   Final    ABSCESS Performed at [facility], [address] [place], [place] [address]    Special Requests   Final    NONE Performed at [facility], [address] [place], [place] [address]    Gram Stain   Final    ABUNDANT WBC PRESENT, PREDOMINANTLY PMN MODERATE GRAM NEGATIVE RODS    Culture   Final    ABUNDANT ESCHERICHIA COLI NO ANAEROBES ISOLATED Performed at [facility], [address], [place], [place] [address]    Report Status 08/07/2020 FINAL  Final   Organism ID, Bacteria ESCHERICHIA COLI  Final       Susceptibility   Escherichia coli - MIC*    AMPICILLIN <=2 SENSITIVE Sensitive     CEFAZOLIN <=4 SENSITIVE Sensitive     CEFEPIME <=0.12 SENSITIVE Sensitive     CEFTAZIDIME <=1 SENSITIVE Sensitive     CEFTRIAXONE <=0.25 SENSITIVE Sensitive  CIPROFLOXACIN <=0.25 SENSITIVE Sensitive     GENTAMICIN <=1 SENSITIVE Sensitive     IMIPENEM <=0.25 SENSITIVE Sensitive     TRIMETH/SULFA <=20 SENSITIVE Sensitive     AMPICILLIN/SULBACTAM <=2 SENSITIVE Sensitive     PIP/TAZO <=4 SENSITIVE Sensitive     * ABUNDANT ESCHERICHIA COLI         Radiology Studies: CT CHEST ABDOMEN PELVIS W CONTRAST  Result Date: 08/08/2020 CLINICAL DATA:  60 year old female with history of complicated pyelonephritis. EXAM: CT CHEST, ABDOMEN, AND PELVIS WITH CONTRAST TECHNIQUE: Multidetector CT imaging of the chest, abdomen and pelvis was performed following the standard protocol during bolus administration of intravenous contrast. CONTRAST:  OMNIPAQUE IOHEXOL 300 MG/ML  SOLN COMPARISON:  CT the chest, abdomen and pelvis 08/01/2020. FINDINGS: CT CHEST FINDINGS Cardiovascular: Heart size is normal. There is no significant pericardial fluid, thickening or pericardial calcification. There is aortic atherosclerosis, as well as atherosclerosis of the great vessels of the mediastinum and the coronary arteries, including calcified atherosclerotic plaque in the left anterior descending coronary artery. Dilatation of the pulmonic trunk (3.6 cm in diameter). Mediastinum/Nodes: No pathologically enlarged in mediastinal or hilar lymph nodes. Esophagus is unremarkable in appearance. No axillary lymphadenopathy. Lungs/Pleura: Numerous pulmonary nodules and masses are again noted throughout the lungs bilaterally, many of which have central areas of cavitation. The largest of these is in the posterior aspect of the right upper lobe (axial image 54 of series 5) measuring 3.2 x 3.2 cm, with central low attenuation regions and internal  cavitation, compatible with a large intrapulmonary abscess. Overall, findings are strongly suggestive of widespread septic emboli. Moderate bilateral pleural effusions lying dependently with extensive passive atelectasis in the lower lobes of the lungs bilaterally. Musculoskeletal: There are no aggressive appearing lytic or blastic lesions noted in the visualized portions of the skeleton. CT ABDOMEN PELVIS FINDINGS Hepatobiliary: No suspicious cystic or solid hepatic lesions. No intra or extrahepatic biliary ductal dilatation. Gallbladder is normal in appearance. Pancreas: No pancreatic mass. No pancreatic ductal dilatation. No peripancreatic fluid collections or inflammatory changes. Spleen: Unremarkable. Adrenals/Urinary Tract: New nephrostomy tube extends into the left renal pelvis and appears appropriately located. The left kidneys enlarged and very heterogeneous in appearance with multiple hypovascular hypoenhancing regions, compatible with residual areas of pyelonephritis and associated renal and perinephric abscesses. These extend into the perinephric space where there is a combination of gas and fluid. Some of this tracks caudally where there is a larger collection of predominantly fluid, trace amount of gas, and some higher attenuation material adjacent to the tip of an indwelling drain (best appreciated on axial image 89 of series 2 and coronal image 74 of series 6) which measures approximately 4.3 x 2.1 x 3.6 cm. This perinephric fluid collection trucks caudally into the left hemipelvis along the left pelvic sidewall, coming into contact with the left iliacus musculature. Overall, the extent of fluid in this collection appears increased. This is well demonstrated in a relatively well-defined portion of the collection laterally which currently measures 9.5 x 3.6 cm (axial image 78 of series 2) and previously measured only 8.2 x 2.4 cm when measured in a similar fashion on 08/01/2020. Right kidney and  bilateral adrenal glands are normal in appearance. No right hydroureteronephrosis. Urinary bladder is completely decompressed around an indwelling Foley balloon catheter. Small amount of gas present in the lumen of the urinary bladder is presumably iatrogenic. Stomach/Bowel: The appearance of the stomach is normal. There is no pathologic dilatation of small bowel or colon. Normal appendix.  Vascular/Lymphatic: Aortic atherosclerosis, without evidence of aneurysm or dissection in the abdominal or pelvic vasculature. Filling defect in the left renal vein best appreciated on coronal image 77 of series 6, compatible with nonocclusive left renal vein thrombus. No lymphadenopathy noted in the abdomen or pelvis. Reproductive: Uterus and ovaries are unremarkable in appearance. Other: Diffuse body wall edema. Trace volume of ascites. No pneumoperitoneum. Musculoskeletal: There are no aggressive appearing lytic or blastic lesions noted in the visualized portions of the skeleton. IMPRESSION: 1. Compared to the prior study there is been interval nephrostomy tube placement which appears appropriately located, as well as percutaneous drainage catheter placement in the inferior aspect of the patient's extensive perinephric abscess. Unfortunately, the overall size of the abscess appears increased compared to the prior examination, and there is new high attenuation fluid within the inferior aspect of this collection, which could represent some recent hemorrhage. If there is concern for active hemorrhage, further evaluation with triple-phase CT of the abdomen and pelvis should be considered to evaluate for source of active bleeding. 2. Numerous large pulmonary nodules and masses in the lungs bilaterally, with imaging characteristics most compatible with extensive septic emboli with multifocal pulmonary abscess formation. 3. 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 This likely represents the source of emboli in this patient with evidence of septic embolization. 4. Moderate bilateral pleural effusions lying dependently with associated areas of passive atelectasis in the lower lobes of the lungs bilaterally. 5. Dilatation of the pulmonic trunk (3.6 cm in diameter), concerning for pulmonary arterial hypertension. 6. Additional incidental findings, as above. These results were called by telephone at the time of interpretation on 08/08/2020 at 5:38 pm to provider [facility], who verbally acknowledged these results. Electronically Signed   By: Trudie Reed M.D.   On: 08/08/2020 17:38   DG Abd 2 Views  Result Date: 08/08/2020 CLINICAL DATA:  Abdominal pain and decreased oral intake. EXAM: ABDOMEN - 2 VIEW COMPARISON:  Plain film of the abdomen dated 08/07/2020. FINDINGS: Mildly distended small bowel within the central abdomen with nonspecific air-fluid levels in the upper pelvis. Air is again seen within the colon. No evidence of renal or ureteral calculi. Two percutaneous pigtail catheters are again seen within the LEFT abdomen. Dense consolidation the LEFT lung base, presumed pneumonia. IMPRESSION: 1. Mildly distended small bowel within the central abdomen with nonspecific air-fluid levels in the upper pelvis. Findings could represent ileus or partial small bowel obstruction. 2. LEFT lower lung consolidation, pneumonia versus atelectasis. Electronically Signed   By: Bary Richard M.D.   On: 08/08/2020 10:04   [place] EKG SITE RITE  Result Date: 08/09/2020 If Site Rite image not attached, placement could not be confirmed due to current cardiac rhythm.       Scheduled Meds: . chlorhexidine  15 mL Mouth Rinse BID  . Chlorhexidine Gluconate Cloth  6 each Topical Daily  . feeding supplement (GLUCERNA SHAKE)  237 mL Oral BID BM  . heparin injection (subcutaneous)  5,000 Units Subcutaneous Q8H  . insulin aspart  0-20 Units Subcutaneous TID WC  .  insulin aspart  0-5 Units Subcutaneous QHS  . insulin glargine  8 Units Subcutaneous BID  . mouth rinse  15 mL Mouth Rinse q12n4p  . sodium chloride flush  5 mL Intracatheter Q8H   Continuous Infusions: .  ceFAZolin (ANCEF) IV 2 g (08/09/20 1041)  . lactated ringers 50 mL/hr at 08/09/20 0851  . magnesium sulfate bolus IVPB 4  g (08/09/20 1203)  . potassium chloride 10 mEq (08/09/20 1254)  . sodium chloride       LOS: 9 days    Time spent: 40 minutes    Ramiro Harvest, MD [facility]   To contact the attending provider between 7A-7P or the covering provider during after hours 7P-7A, please log into the web site [email or web address] and access using universal [place] password for that web site. If you do not have the password, please call the hospital operator.  08/09/2020, 1:16 PM

## 2020-08-09 NOTE — Consult Note (Addendum)
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 Continue ancef 2. Await her repeat CT scan on 08-11-20 to help determine her length of therapy 3. I would suggest she needs at least 3 weeks of IV therapy (providing her CT shows improvement) 4. Diabetic teaching and control 5. Pulmonary outpt f/u for her lung nodules.  6. Appreciate uro and IR f/u.   Thank you so much for this interesting consult,  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 with type 2 diabetes mellitus (HCC)   Ileus (HCC)   . chlorhexidine  15 mL Mouth Rinse BID  . Chlorhexidine Gluconate Cloth  6 each Topical Daily  . feeding supplement (GLUCERNA SHAKE)  237 mL Oral BID BM  . furosemide  40 mg Intravenous BID  . heparin injection (subcutaneous)  5,000 Units Subcutaneous Q8H  . insulin aspart  0-20 Units Subcutaneous TID WC  . insulin aspart  0-5 Units Subcutaneous QHS  . insulin glargine  8 Units Subcutaneous BID  . mouth rinse  15 mL Mouth Rinse q12n4p  . sodium chloride flush  5 mL Intracatheter Q8H    HPI: Tammie [name] is a 60 y.o. female  adm on 12-23 to [facility] with abd pain found to have L emphysematous pyelo/abscess, sepsis, AKI, HONK (Glc 706) and hyponatremia (102). Her WBC was 44. She was also found to have lung nodules. On date of adm she underwent CT guided L nephrostomy (purulent d/c) and L peri-renal drain.  Her BCx grew 2/4 E coli, she was changed to ceftriaxone. Then on 12-27 she was changed to ancef to further narrow her rx.  By 12-30 she was transferred out of ICU to regular floor. Cr 1.27.  Over last 48h has been noted to have abd pain. She is being eval for ileus.  Had /fu from urology today who rec repeat  CT on 08-11-20.   She's been afebrile since adm, her WBC is now 10.6.   Review of Systems: Review of Systems  Constitutional: Negative for chills and fever.  Eyes: Negative for blurred vision.  Respiratory: Positive for cough.   Gastrointestinal: Positive for constipation.  Neurological: Negative for sensory change.  All other systems reviewed and are negative.   Past Medical History:  Diagnosis Date  . Class 1 obesity   . Hypertension     Social History   Tobacco Use  . Smoking status: Never Smoker  . Smokeless tobacco: Never Used  Vaping Use  . Vaping Use: Never used  Substance Use Topics  .  Alcohol use: Not Currently  . Drug use: Not Currently    Family History  Problem Relation Age of Onset  . Huntington's disease Father   . Diabetes Sister      Medications:  Scheduled: . chlorhexidine  15 mL Mouth Rinse BID  . Chlorhexidine Gluconate Cloth  6 each Topical Daily  . feeding supplement (GLUCERNA SHAKE)  237 mL Oral BID BM  . furosemide  40 mg Intravenous BID  . heparin injection (subcutaneous)  5,000 Units Subcutaneous Q8H  . insulin aspart  0-20 Units Subcutaneous TID WC  . insulin aspart  0-5 Units Subcutaneous QHS  . insulin glargine  8 Units Subcutaneous BID  . mouth rinse  15 mL Mouth Rinse q12n4p  . sodium chloride flush  5 mL Intracatheter Q8H    Abtx:   Anti-infectives (From admission, onward)   Start     Dose/Rate Route Frequency Ordered Stop   08/05/20 1000  ceFAZolin (ANCEF) IVPB 2g/100 mL premix        2 g 200 mL/hr over 30 Minutes Intravenous Every 8 hours 08/05/20 0903     08/04/20 2200  ceFAZolin (ANCEF) IVPB 2g/100 mL premix  Status:  Discontinued        2 g 200 mL/hr over 30 Minutes Intravenous Every 12 hours 08/04/20 1131 08/05/20 0903   08/02/20 0000  piperacillin-tazobactam (ZOSYN) IVPB 3.375 g  Status:  Discontinued        3.375 g 12.5 mL/hr over 240 Minutes Intravenous Every 12 hours 08/01/20 1403 08/01/20 2006   08/01/20 2100  cefTRIAXone (ROCEPHIN) 2 g in sodium chloride 0.9 % 100 mL IVPB  Status:  Discontinued        2 g 200 mL/hr over 30 Minutes Intravenous Every 24 hours 08/01/20 2006 08/04/20 1131   08/01/20 1430  piperacillin-tazobactam (ZOSYN) IVPB 3.375 g        3.375 g 100 mL/hr over 30 Minutes Intravenous  Once 08/01/20 1355 08/01/20 1502   07/31/20 2000  cefTRIAXone (ROCEPHIN) 2 g in sodium chloride 0.9 % 100 mL IVPB  Status:  Discontinued        2 g 200 mL/hr over 30 Minutes Intravenous Every 24 hours 07/31/20 1947 08/01/20 1351   07/31/20 2000  azithromycin (ZITHROMAX) 500 mg in sodium chloride 0.9 % 250 mL IVPB  Status:  Discontinued        500 mg 250 mL/hr over 60 Minutes Intravenous Every 24 hours 07/31/20 1947 08/01/20 1351        OBJECTIVE: Blood pressure 124/60, pulse 89, temperature 98.3 F (36.8 C), temperature source Oral, resp. rate 20, height 5\' 2"  (1.575 m), weight 80.6 kg, SpO2 96 %.  Physical Exam Vitals reviewed.  Constitutional:      General: She is not in acute distress.    Appearance: She is well-developed. She is obese. She is not ill-appearing, toxic-appearing or diaphoretic.  HENT:     Mouth/Throat:     Mouth: Mucous membranes are moist.     Pharynx: No oropharyngeal exudate.  Eyes:     Extraocular Movements: Extraocular movements intact.     Pupils: Pupils are equal,  round, and reactive to light.  Cardiovascular:     Rate and Rhythm: Normal rate and regular rhythm.  Pulmonary:     Effort: Pulmonary effort is normal.     Breath sounds: Normal breath sounds.  Abdominal:     General: Bowel sounds are normal. There is no distension.     Palpations: Abdomen is soft.  Tenderness: There is no abdominal tenderness. There is no guarding.    Musculoskeletal:     Cervical back: Normal range of motion and neck supple.     Right lower leg: No edema.     Left lower leg: No edema.  Neurological:     General: No focal deficit present.     Mental Status: She is alert.     Sensory: No sensory deficit.  Psychiatric:        Mood and Affect: Mood normal.     Lab Results Results for orders placed or performed during the hospital encounter of 07/31/20 (from the past 48 hour(s))  Glucose, capillary     Status: Abnormal   Collection Time: 08/07/20  4:50 PM  Result Value Ref Range   Glucose-Capillary 240 (H) 70 - 99 mg/dL    Comment: Glucose reference range applies only to samples taken after fasting for at least 8 hours.  Glucose, capillary     Status: Abnormal   Collection Time: 08/07/20 10:40 PM  Result Value Ref Range   Glucose-Capillary 122 (H) 70 - 99 mg/dL    Comment: Glucose reference range applies only to samples taken after fasting for at least 8 hours.  Magnesium     Status: None   Collection Time: 08/08/20  5:02 AM  Result Value Ref Range   Magnesium 2.0 1.7 - 2.4 mg/dL    Comment: Performed at [facility], [address] [place], [place] [address]  Renal function panel     Status: Abnormal   Collection Time: 08/08/20  8:23 AM  Result Value Ref Range   Sodium 138 135 - 145 mmol/L   Potassium 3.1 (L) 3.5 - 5.1 mmol/L   Chloride 97 (L) 98 - 111 mmol/L   CO2 30 22 - 32 mmol/L   Glucose, Bld 80 70 - 99 mg/dL    Comment: Glucose reference range applies only to samples taken after fasting for at least 8 hours.   BUN 32 (H) 6  - 20 mg/dL   Creatinine, Ser 6.210.98 0.44 - 1.00 mg/dL   Calcium 8.1 (L) 8.9 - 10.3 mg/dL   Phosphorus 2.8 2.5 - 4.6 mg/dL   Albumin 2.0 (L) 3.5 - 5.0 g/dL   GFR, Estimated >30>60 >86>60 mL/min    Comment: (NOTE) Calculated using the CKD-EPI Creatinine Equation (2021)    Anion gap 11 5 - 15    Comment: Performed at [facility], [address] [place], [place] [address]  CBC with Differential/Platelet     Status: Abnormal   Collection Time: 08/08/20  8:23 AM  Result Value Ref Range   WBC 11.1 (H) 4.0 - 10.5 K/uL   RBC 3.26 (L) 3.87 - 5.11 MIL/uL   Hemoglobin 9.9 (L) 12.0 - 15.0 g/dL   HCT 96.230.8 (L) 95.236.0 - 84.146.0 %   MCV 94.5 80.0 - 100.0 fL   MCH 30.4 26.0 - 34.0 pg   MCHC 32.1 30.0 - 36.0 g/dL   RDW 32.414.8 40.111.5 - 02.715.5 %   Platelets 327 150 - 400 K/uL   nRBC 0.0 0.0 - 0.2 %   Neutrophils Relative % 83 %   Neutro Abs 9.1 (H) 1.7 - 7.7 K/uL   Lymphocytes Relative 9 %   Lymphs Abs 1.0 0.7 - 4.0 K/uL   Monocytes Relative 6 %   Monocytes Absolute 0.7 0.1 - 1.0 K/uL   Eosinophils Relative 1 %   Eosinophils Absolute 0.1 0.0 - 0.5 K/uL  Basophils Relative 0 %   Basophils Absolute 0.0 0.0 - 0.1 K/uL   Immature Granulocytes 1 %   Abs Immature Granulocytes 0.10 (H) 0.00 - 0.07 K/uL    Comment: Performed at [facility], [address], [place], [place] [address]  Glucose, capillary     Status: None   Collection Time: 08/08/20  4:53 PM  Result Value Ref Range   Glucose-Capillary 78 70 - 99 mg/dL    Comment: Glucose reference range applies only to samples taken after fasting for at least 8 hours.  Glucose, capillary     Status: None   Collection Time: 08/08/20  8:50 PM  Result Value Ref Range   Glucose-Capillary 75 70 - 99 mg/dL    Comment: Glucose reference range applies only to samples taken after fasting for at least 8 hours.  Comprehensive metabolic panel     Status: Abnormal   Collection Time: 08/09/20  5:28 AM  Result Value Ref Range   Sodium 137  135 - 145 mmol/L   Potassium 3.6 3.5 - 5.1 mmol/L   Chloride 100 98 - 111 mmol/L   CO2 28 22 - 32 mmol/L   Glucose, Bld 126 (H) 70 - 99 mg/dL    Comment: Glucose reference range applies only to samples taken after fasting for at least 8 hours.   BUN 24 (H) 6 - 20 mg/dL   Creatinine, Ser 6.04 (H) 0.44 - 1.00 mg/dL   Calcium 8.0 (L) 8.9 - 10.3 mg/dL   Total Protein 5.1 (L) 6.5 - 8.1 g/dL   Albumin 2.0 (L) 3.5 - 5.0 g/dL   AST 22 15 - 41 U/L   ALT 8 0 - 44 U/L   Alkaline Phosphatase 165 (H) 38 - 126 U/L   Total Bilirubin 0.7 0.3 - 1.2 mg/dL   GFR, Estimated >54 >09 mL/min    Comment: (NOTE) Calculated using the CKD-EPI Creatinine Equation (2021)    Anion gap 9 5 - 15    Comment: Performed at [facility], [address], [place], [place] [address]  CBC with Differential/Platelet     Status: Abnormal   Collection Time: 08/09/20  5:28 AM  Result Value Ref Range   WBC 10.6 (H) 4.0 - 10.5 K/uL   RBC 2.91 (L) 3.87 - 5.11 MIL/uL   Hemoglobin 9.0 (L) 12.0 - 15.0 g/dL   HCT 47.8 (L) 29.5 - 62.1 %   MCV 96.2 80.0 - 100.0 fL   MCH 30.9 26.0 - 34.0 pg   MCHC 32.1 30.0 - 36.0 g/dL   RDW 30.8 65.7 - 84.6 %   Platelets 297 150 - 400 K/uL   nRBC 0.0 0.0 - 0.2 %   Neutrophils Relative % 83 %   Neutro Abs 8.9 (H) 1.7 - 7.7 K/uL   Lymphocytes Relative 9 %   Lymphs Abs 1.0 0.7 - 4.0 K/uL   Monocytes Relative 6 %   Monocytes Absolute 0.6 0.1 - 1.0 K/uL   Eosinophils Relative 1 %   Eosinophils Absolute 0.1 0.0 - 0.5 K/uL   Basophils Relative 0 %   Basophils Absolute 0.0 0.0 - 0.1 K/uL   Immature Granulocytes 1 %   Abs Immature Granulocytes 0.07 0.00 - 0.07 K/uL    Comment: Performed at [facility], [address] [place], [place] [address]  Magnesium     Status: Abnormal   Collection Time: 08/09/20  5:28 AM  Result Value Ref Range   Magnesium 1.6 (L)  1.7 - 2.4 mg/dL    Comment: Performed at [facility], [address],  [place], [place] [address]  Phosphorus     Status: None   Collection Time: 08/09/20  5:28 AM  Result Value Ref Range   Phosphorus 2.9 2.5 - 4.6 mg/dL    Comment: Performed at [facility], [address], [place], [place] [address]  Glucose, capillary     Status: Abnormal   Collection Time: 08/09/20  7:39 AM  Result Value Ref Range   Glucose-Capillary 117 (H) 70 - 99 mg/dL    Comment: Glucose reference range applies only to samples taken after fasting for at least 8 hours.  Glucose, capillary     Status: Abnormal   Collection Time: 08/09/20 11:19 AM  Result Value Ref Range   Glucose-Capillary 230 (H) 70 - 99 mg/dL    Comment: Glucose reference range applies only to samples taken after fasting for at least 8 hours.      Component Value Date/Time   SDES  08/01/2020 1938    ABSCESS Performed at [facility], [address] [place], [place] [address]    SPECREQUEST  08/01/2020 1938    NONE Performed at [facility], [address] [place], [place] [address]    CULT  08/01/2020 1938    ABUNDANT ESCHERICHIA COLI NO ANAEROBES ISOLATED Performed at [facility], [address] [place], [place] [address]    REPTSTATUS 08/07/2020 FINAL 08/01/2020 1938   CT CHEST ABDOMEN PELVIS W CONTRAST  Result Date: 08/08/2020 CLINICAL DATA:  60 year old female with history of complicated pyelonephritis. EXAM: CT CHEST, ABDOMEN, AND PELVIS WITH CONTRAST TECHNIQUE: Multidetector CT imaging of the chest, abdomen and pelvis was performed following the standard protocol during bolus administration of intravenous contrast. CONTRAST:  OMNIPAQUE IOHEXOL 300 MG/ML  SOLN COMPARISON:  CT the chest, abdomen and pelvis 08/01/2020. FINDINGS: CT CHEST FINDINGS Cardiovascular: Heart size is normal. There is no significant pericardial fluid, thickening or pericardial calcification. There is aortic atherosclerosis, as well as atherosclerosis of the great  vessels of the mediastinum and the coronary arteries, including calcified atherosclerotic plaque in the left anterior descending coronary artery. Dilatation of the pulmonic trunk (3.6 cm in diameter). Mediastinum/Nodes: No pathologically enlarged in mediastinal or hilar lymph nodes. Esophagus is unremarkable in appearance. No axillary lymphadenopathy. Lungs/Pleura: Numerous pulmonary nodules and masses are again noted throughout the lungs bilaterally, many of which have central areas of cavitation. The largest of these is in the posterior aspect of the right upper lobe (axial image 54 of series 5) measuring 3.2 x 3.2 cm, with central low attenuation regions and internal cavitation, compatible with a large intrapulmonary abscess. Overall, findings are strongly suggestive of widespread septic emboli. Moderate bilateral pleural effusions lying dependently with extensive passive atelectasis in the lower lobes of the lungs bilaterally. Musculoskeletal: There are no aggressive appearing lytic or blastic lesions noted in the visualized portions of the skeleton. CT ABDOMEN PELVIS FINDINGS Hepatobiliary: No suspicious cystic or solid hepatic lesions. No intra or extrahepatic biliary ductal dilatation. Gallbladder is normal in appearance. Pancreas: No pancreatic mass. No pancreatic ductal dilatation. No peripancreatic fluid collections or inflammatory changes. Spleen: Unremarkable. Adrenals/Urinary Tract: New nephrostomy tube extends into the left renal pelvis and appears appropriately located. The left kidneys enlarged and very heterogeneous in appearance with multiple hypovascular hypoenhancing regions, compatible with residual areas of pyelonephritis and associated renal and perinephric abscesses. These extend into the perinephric space where  there is a combination of gas and fluid. Some of this tracks caudally where there is a larger collection of predominantly fluid, trace amount of gas, and some higher attenuation  material adjacent to the tip of an indwelling drain (best appreciated on axial image 89 of series 2 and coronal image 74 of series 6) which measures approximately 4.3 x 2.1 x 3.6 cm. This perinephric fluid collection trucks caudally into the left hemipelvis along the left pelvic sidewall, coming into contact with the left iliacus musculature. Overall, the extent of fluid in this collection appears increased. This is well demonstrated in a relatively well-defined portion of the collection laterally which currently measures 9.5 x 3.6 cm (axial image 78 of series 2) and previously measured only 8.2 x 2.4 cm when measured in a similar fashion on 08/01/2020. Right kidney and bilateral adrenal glands are normal in appearance. No right hydroureteronephrosis. Urinary bladder is completely decompressed around an indwelling Foley balloon catheter. Small amount of gas present in the lumen of the urinary bladder is presumably iatrogenic. Stomach/Bowel: The appearance of the stomach is normal. There is no pathologic dilatation of small bowel or colon. Normal appendix. Vascular/Lymphatic: Aortic atherosclerosis, without evidence of aneurysm or dissection in the abdominal or pelvic vasculature. Filling defect in the left renal vein best appreciated on coronal image 77 of series 6, compatible with nonocclusive left renal vein thrombus. No lymphadenopathy noted in the abdomen or pelvis. Reproductive: Uterus and ovaries are unremarkable in appearance. Other: Diffuse body wall edema. Trace volume of ascites. No pneumoperitoneum. Musculoskeletal: There are no aggressive appearing lytic or blastic lesions noted in the visualized portions of the skeleton. IMPRESSION: 1. Compared to the prior study there is been interval nephrostomy tube placement which appears appropriately located, as well as percutaneous drainage catheter placement in the inferior aspect of the patient's extensive perinephric abscess. Unfortunately, the overall size of  the abscess appears increased compared to the prior examination, and there is new high attenuation fluid within the inferior aspect of this collection, which could represent some recent hemorrhage. If there is concern for active hemorrhage, further evaluation with triple-phase CT of the abdomen and pelvis should be considered to evaluate for source of active bleeding. 2. Numerous large pulmonary nodules and masses in the lungs bilaterally, with imaging characteristics most compatible with extensive septic emboli with multifocal pulmonary abscess formation. 3. 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 This likely represents the source of emboli in this patient with evidence of septic embolization. 4. Moderate bilateral pleural effusions lying dependently with associated areas of passive atelectasis in the lower lobes of the lungs bilaterally. 5. Dilatation of the pulmonic trunk (3.6 cm in diameter), concerning for pulmonary arterial hypertension. 6. Additional incidental findings, as above. These results were called by telephone at the time of interpretation on 08/08/2020 at 5:38 pm to provider [facility], who verbally acknowledged these results. Electronically Signed   By: Trudie Reed M.D.   On: 08/08/2020 17:38   DG Abd 2 Views  Result Date: 08/08/2020 CLINICAL DATA:  Abdominal pain and decreased oral intake. EXAM: ABDOMEN - 2 VIEW COMPARISON:  Plain film of the abdomen dated 08/07/2020. FINDINGS: Mildly distended small bowel within the central abdomen with nonspecific air-fluid levels in the upper pelvis. Air is again seen within the colon. No evidence of renal or ureteral calculi. Two percutaneous pigtail catheters are again seen within the LEFT abdomen. Dense consolidation the LEFT lung base, presumed pneumonia. IMPRESSION: 1.  Mildly distended small bowel within the central abdomen with nonspecific air-fluid levels  in the upper pelvis. Findings could represent ileus or partial small bowel obstruction. 2. LEFT lower lung consolidation, pneumonia versus atelectasis. Electronically Signed   By: Bary RichardStan  Maynard M.D.   On: 08/08/2020 10:04   US EKG SITE RITE  Result Date: 08/09/2020 If Site Rite image not attached, placement could not be confirmed due to current cardiac rhythm.  Recent Results (from the past 240 hour(s))  Culture, blood (single)     Status: Abnormal   Collection Time: 07/31/20  5:26 PM   Specimen: BLOOD  Result Value Ref Range Status   Specimen Description   Final    BLOOD LEFT ANTECUBITAL Performed at [facility], [address] [place], [place] [address]    Special Requests   Final    BOTTLES DRAWN AEROBIC AND ANAEROBIC Blood Culture adequate volume Performed at [facility], [address] [place], [place] [address]    Culture  Setup Time   Final    GRAM NEGATIVE RODS AEROBIC AND ANEROBIC BOTTLES Gram Stain Report Called to,Read Back By and Verified With: LARIMORE, A. 12/24 @ 0740 BEARD, S.  CRITICAL RESULT CALLED TO, READ BACK BY AND VERIFIED WITH: PHARMD  Lucita LoraERRY G. [id number] FCP Performed at [facility], [address] [place], [place] [address]    Culture ESCHERICHIA COLI (A)  Final   Report Status 08/03/2020 FINAL  Final   Organism ID, Bacteria ESCHERICHIA COLI  Final      Susceptibility   Escherichia coli - MIC*    AMPICILLIN <=2 SENSITIVE Sensitive     CEFAZOLIN <=4 SENSITIVE Sensitive     CEFEPIME <=0.12 SENSITIVE Sensitive     CEFTAZIDIME <=1 SENSITIVE Sensitive     CEFTRIAXONE <=0.25 SENSITIVE Sensitive     CIPROFLOXACIN <=0.25 SENSITIVE Sensitive     GENTAMICIN <=1 SENSITIVE Sensitive     IMIPENEM <=0.25 SENSITIVE Sensitive     TRIMETH/SULFA <=20 SENSITIVE Sensitive     AMPICILLIN/SULBACTAM <=2 SENSITIVE Sensitive     PIP/TAZO <=4 SENSITIVE Sensitive     * ESCHERICHIA COLI  Blood Culture ID Panel (Reflexed)     Status: Abnormal   Collection Time:  07/31/20  5:26 PM  Result Value Ref Range Status   Enterococcus faecalis NOT DETECTED NOT DETECTED Final   Enterococcus Faecium NOT DETECTED NOT DETECTED Final   Listeria monocytogenes NOT DETECTED NOT DETECTED Final   Staphylococcus species NOT DETECTED NOT DETECTED Final   Staphylococcus aureus (BCID) NOT DETECTED NOT DETECTED Final   Staphylococcus epidermidis NOT DETECTED NOT DETECTED Final   Staphylococcus lugdunensis NOT DETECTED NOT DETECTED Final   Streptococcus species NOT DETECTED NOT DETECTED Final   Streptococcus agalactiae NOT DETECTED NOT DETECTED Final   Streptococcus pneumoniae NOT DETECTED NOT DETECTED Final   Streptococcus pyogenes NOT DETECTED NOT DETECTED Final   A.calcoaceticus-baumannii NOT DETECTED NOT DETECTED Final   Bacteroides fragilis NOT DETECTED NOT DETECTED Final   Enterobacterales DETECTED (A) NOT DETECTED Final    Comment: Enterobacterales represent a large order of gram negative bacteria, not a single organism. CRITICAL RESULT CALLED TO, READ BACK BY AND VERIFIED WITH: PHARMD  TERRY G. [id number] FCP    Enterobacter cloacae complex NOT DETECTED NOT DETECTED Final   Escherichia coli DETECTED (A) NOT DETECTED Final    Comment: CRITICAL RESULT CALLED TO, READ BACK BY AND VERIFIED WITH: PHARMD  TERRY G. [id number] FCP    Klebsiella aerogenes NOT DETECTED NOT  DETECTED Final   Klebsiella oxytoca NOT DETECTED NOT DETECTED Final   Klebsiella pneumoniae NOT DETECTED NOT DETECTED Final   Proteus species NOT DETECTED NOT DETECTED Final   Salmonella species NOT DETECTED NOT DETECTED Final   Serratia marcescens NOT DETECTED NOT DETECTED Final   Haemophilus influenzae NOT DETECTED NOT DETECTED Final   Neisseria meningitidis NOT DETECTED NOT DETECTED Final   Pseudomonas aeruginosa NOT DETECTED NOT DETECTED Final   Stenotrophomonas maltophilia NOT DETECTED NOT DETECTED Final   Candida albicans NOT DETECTED NOT DETECTED Final   Candida auris NOT DETECTED NOT  DETECTED Final   Candida glabrata NOT DETECTED NOT DETECTED Final   Candida krusei NOT DETECTED NOT DETECTED Final   Candida parapsilosis NOT DETECTED NOT DETECTED Final   Candida tropicalis NOT DETECTED NOT DETECTED Final   Cryptococcus neoformans/gattii NOT DETECTED NOT DETECTED Final   CTX-M ESBL NOT DETECTED NOT DETECTED Final   Carbapenem resistance IMP NOT DETECTED NOT DETECTED Final   Carbapenem resistance KPC NOT DETECTED NOT DETECTED Final   Carbapenem resistance NDM NOT DETECTED NOT DETECTED Final   Carbapenem resist OXA 48 LIKE NOT DETECTED NOT DETECTED Final   Carbapenem resistance VIM NOT DETECTED NOT DETECTED Final    Comment: Performed at [facility], [address] [place], [place] [address]  Resp Panel by RT-PCR (Flu A&B, Covid) Nasopharyngeal Swab     Status: None   Collection Time: 07/31/20  5:39 PM   Specimen: Nasopharyngeal Swab; Nasopharyngeal(NP) swabs in vial transport medium  Result Value Ref Range Status   SARS Coronavirus 2 by RT PCR NEGATIVE NEGATIVE Final    Comment: (NOTE) SARS-CoV-2 target nucleic acids are NOT DETECTED.  The SARS-CoV-2 RNA is generally detectable in upper respiratory specimens during the acute phase of infection. The lowest concentration of SARS-CoV-2 viral copies this assay can detect is 138 copies/mL. A negative result does not preclude SARS-Cov-2 infection and should not be used as the sole basis for treatment or other patient management decisions. A negative result may occur with  improper specimen collection/handling, submission of specimen other than nasopharyngeal swab, presence of viral mutation(s) within the areas targeted by this assay, and inadequate number of viral copies(<138 copies/mL). A negative result must be combined with clinical observations, patient history, and epidemiological information. The expected result is Negative.  Fact Sheet for Patients:  BloggerCourse.com  Fact Sheet  for Healthcare Providers:  SeriousBroker.it  This test is no t yet approved or cleared by the [place] FDA and  has been authorized for detection and/or diagnosis of SARS-CoV-2 by FDA under an Emergency Use Authorization (EUA). This EUA will remain  in effect (meaning this test can be used) for the duration of the COVID-19 declaration under Section 564(b)(1) of the Act, 21 U.S.C.section 360bbb-3(b)(1), unless the authorization is terminated  or revoked sooner.       Influenza A by PCR NEGATIVE NEGATIVE Final   Influenza B by PCR NEGATIVE NEGATIVE Final    Comment: (NOTE) The Xpert Xpress SARS-CoV-2/FLU/RSV plus assay is intended as an aid in the diagnosis of influenza from Nasopharyngeal swab specimens and should not be used as a sole basis for treatment. Nasal washings and aspirates are unacceptable for Xpert Xpress SARS-CoV-2/FLU/RSV testing.  Fact Sheet for Patients: BloggerCourse.com  Fact Sheet for Healthcare Providers: SeriousBroker.it  This test is not yet approved or cleared by the [place] FDA and has been authorized for detection and/or diagnosis of SARS-CoV-2 by FDA under an Emergency Use Authorization (EUA).  This EUA will remain in effect (meaning this test can be used) for the duration of the COVID-19 declaration under Section 564(b)(1) of the Act, 21 U.S.C. section 360bbb-3(b)(1), unless the authorization is terminated or revoked.  Performed at [facility], [address], [place], [place] [address]   Culture, blood (single)     Status: None   Collection Time: 07/31/20  8:23 PM   Specimen: BLOOD RIGHT WRIST  Result Value Ref Range Status   Specimen Description BLOOD RIGHT WRIST  Final   Special Requests   Final    BOTTLES DRAWN AEROBIC AND ANAEROBIC Blood Culture results may not be optimal due to an excessive volume of blood received in culture bottles   Culture   Final     NO GROWTH 5 DAYS Performed at [facility], [address] [place], [place] [address]    Report Status 08/05/2020 FINAL  Final  Urine Culture     Status: Abnormal   Collection Time: 08/01/20 11:28 AM   Specimen: Urine, Clean Catch  Result Value Ref Range Status   Specimen Description   Final    URINE, CLEAN CATCH Performed at [facility], [address], [place], [place] [address]    Special Requests   Final    NONE Performed at [facility], [address], [place], [place] [address]    Culture (A)  Final    <10,000 COLONIES/mL INSIGNIFICANT GROWTH Performed at [facility], [address], [place], [place] [address]    Report Status 08/02/2020 FINAL  Final  MRSA PCR Screening     Status: None   Collection Time: 08/01/20  3:26 PM   Specimen: Nasopharyngeal  Result Value Ref Range Status   MRSA by PCR NEGATIVE NEGATIVE Final    Comment:        The GeneXpert MRSA Assay (FDA approved for NASAL specimens only), is one component of a comprehensive MRSA colonization surveillance program. It is not intended to diagnose MRSA infection nor to guide or monitor treatment for MRSA infections. Performed at [facility], [address] [place], [place] [address]   Aerobic/Anaerobic Culture (surgical/deep wound)     Status: None   Collection Time: 08/01/20  7:37 PM   Specimen: Urine, Catheterized  Result Value Ref Range Status   Specimen Description   Final    URINE, CATHETERIZED URINE FROM DRAIN Performed at [facility], [address], [place], [place] [address]    Special Requests   Final    NONE Performed at [facility], [address] [place], [place] [address]    Gram Stain   Final    ABUNDANT WBC PRESENT, PREDOMINANTLY PMN RARE GRAM NEGATIVE RODS RARE GRAM POSITIVE RODS    Culture   Final    FEW ESCHERICHIA COLI NO ANAEROBES ISOLATED Performed at [facility], [address] [place],  [place] [address]    Report Status 08/07/2020 FINAL  Final   Organism ID, Bacteria ESCHERICHIA COLI  Final      Susceptibility   Escherichia coli - MIC*    AMPICILLIN 4 SENSITIVE Sensitive     CEFAZOLIN <=4 SENSITIVE Sensitive     CEFEPIME <=0.12 SENSITIVE Sensitive     CEFTRIAXONE <=0.25 SENSITIVE Sensitive     CIPROFLOXACIN <=0.25 SENSITIVE Sensitive     GENTAMICIN <=1 SENSITIVE Sensitive     IMIPENEM <=0.25 SENSITIVE Sensitive     NITROFURANTOIN <=16 SENSITIVE Sensitive     TRIMETH/SULFA <=20  SENSITIVE Sensitive     AMPICILLIN/SULBACTAM <=2 SENSITIVE Sensitive     PIP/TAZO <=4 SENSITIVE Sensitive     * FEW ESCHERICHIA COLI  Aerobic/Anaerobic Culture (surgical/deep wound)     Status: None   Collection Time: 08/01/20  7:38 PM   Specimen: Abscess  Result Value Ref Range Status   Specimen Description   Final    ABSCESS Performed at [place] [address] [place], [place] [address]    Special Requests   Final    NONE Performed at [facility], [place] [address] [place], [place] [address]    Gram Stain   Final    ABUNDANT WBC PRESENT, PREDOMINANTLY PMN MODERATE GRAM NEGATIVE RODS    Culture   Final    ABUNDANT ESCHERICHIA COLI NO ANAEROBES ISOLATED Performed at [facility], [place] [address] [place], [place] [address]    Report Status 08/07/2020 FINAL  Final   Organism ID, Bacteria ESCHERICHIA COLI  Final      Susceptibility   Escherichia coli - MIC*    AMPICILLIN <=2 SENSITIVE Sensitive     CEFAZOLIN <=4 SENSITIVE Sensitive     CEFEPIME <=0.12 SENSITIVE Sensitive     CEFTAZIDIME <=1 SENSITIVE Sensitive     CEFTRIAXONE <=0.25 SENSITIVE Sensitive     CIPROFLOXACIN <=0.25 SENSITIVE Sensitive     GENTAMICIN <=1 SENSITIVE Sensitive     IMIPENEM <=0.25 SENSITIVE Sensitive     TRIMETH/SULFA <=20 SENSITIVE Sensitive     AMPICILLIN/SULBACTAM <=2 SENSITIVE Sensitive     PIP/TAZO <=4 SENSITIVE Sensitive     * ABUNDANT ESCHERICHIA COLI     Microbiology: Recent Results (from the past 240 hour(s))  Culture, blood (single)     Status: Abnormal   Collection Time: 07/31/20  5:26 PM   Specimen: BLOOD  Result Value Ref Range Status   Specimen Description   Final    BLOOD LEFT ANTECUBITAL Performed at [facility], [address], [place], [place] [address]    Special Requests   Final    BOTTLES DRAWN AEROBIC AND ANAEROBIC Blood Culture adequate volume Performed at [facility], [address], [place], [place] [address]    Culture  Setup Time   Final    GRAM NEGATIVE RODS AEROBIC AND ANEROBIC BOTTLES Gram Stain Report Called to,Read Back By and Verified With: LARIMORE, A. 12/24 @ 0740 BEARD, S.  CRITICAL RESULT CALLED TO, READ BACK BY AND VERIFIED WITH: PHARMD  Mitzie Na [id number] 1921 FCP Performed at [facility], [place] [address], [place], [place] [address]    Culture ESCHERICHIA COLI (A)  Final   Report Status 08/03/2020 FINAL  Final   Organism ID, Bacteria ESCHERICHIA COLI  Final      Susceptibility   Escherichia coli - MIC*    AMPICILLIN <=2 SENSITIVE Sensitive     CEFAZOLIN <=4 SENSITIVE Sensitive     CEFEPIME <=0.12 SENSITIVE Sensitive     CEFTAZIDIME <=1 SENSITIVE Sensitive     CEFTRIAXONE <=0.25 SENSITIVE Sensitive     CIPROFLOXACIN <=0.25 SENSITIVE Sensitive     GENTAMICIN <=1 SENSITIVE Sensitive     IMIPENEM <=0.25 SENSITIVE Sensitive     TRIMETH/SULFA <=20 SENSITIVE Sensitive     AMPICILLIN/SULBACTAM <=2 SENSITIVE Sensitive     PIP/TAZO <=4 SENSITIVE Sensitive     * ESCHERICHIA COLI  Blood Culture ID Panel (Reflexed)     Status: Abnormal   Collection Time: 07/31/20  5:26 PM  Result Value Ref Range Status  Enterococcus faecalis NOT DETECTED NOT DETECTED Final   Enterococcus Faecium NOT DETECTED NOT DETECTED Final   Listeria monocytogenes NOT DETECTED NOT DETECTED Final   Staphylococcus species NOT DETECTED NOT DETECTED Final   Staphylococcus aureus (BCID) NOT DETECTED NOT DETECTED Final    Staphylococcus epidermidis NOT DETECTED NOT DETECTED Final   Staphylococcus lugdunensis NOT DETECTED NOT DETECTED Final   Streptococcus species NOT DETECTED NOT DETECTED Final   Streptococcus agalactiae NOT DETECTED NOT DETECTED Final   Streptococcus pneumoniae NOT DETECTED NOT DETECTED Final   Streptococcus pyogenes NOT DETECTED NOT DETECTED Final   A.calcoaceticus-baumannii NOT DETECTED NOT DETECTED Final   Bacteroides fragilis NOT DETECTED NOT DETECTED Final   Enterobacterales DETECTED (A) NOT DETECTED Final    Comment: Enterobacterales represent a large order of gram negative bacteria, not a single organism. CRITICAL RESULT CALLED TO, READ BACK BY AND VERIFIED WITH: PHARMD  TERRY G. [id number] 1921 FCP    Enterobacter cloacae complex NOT DETECTED NOT DETECTED Final   Escherichia coli DETECTED (A) NOT DETECTED Final    Comment: CRITICAL RESULT CALLED TO, READ BACK BY AND VERIFIED WITH: PHARMD  TERRY G. [id number] 1921 FCP    Klebsiella aerogenes NOT DETECTED NOT DETECTED Final   Klebsiella oxytoca NOT DETECTED NOT DETECTED Final   Klebsiella pneumoniae NOT DETECTED NOT DETECTED Final   Proteus species NOT DETECTED NOT DETECTED Final   Salmonella species NOT DETECTED NOT DETECTED Final   Serratia marcescens NOT DETECTED NOT DETECTED Final   Haemophilus influenzae NOT DETECTED NOT DETECTED Final   Neisseria meningitidis NOT DETECTED NOT DETECTED Final   Pseudomonas aeruginosa NOT DETECTED NOT DETECTED Final   Stenotrophomonas maltophilia NOT DETECTED NOT DETECTED Final   Candida albicans NOT DETECTED NOT DETECTED Final   Candida auris NOT DETECTED NOT DETECTED Final   Candida glabrata NOT DETECTED NOT DETECTED Final   Candida krusei NOT DETECTED NOT DETECTED Final   Candida parapsilosis NOT DETECTED NOT DETECTED Final   Candida tropicalis NOT DETECTED NOT DETECTED Final   Cryptococcus neoformans/gattii NOT DETECTED NOT DETECTED Final   CTX-M ESBL NOT DETECTED NOT DETECTED Final    Carbapenem resistance IMP NOT DETECTED NOT DETECTED Final   Carbapenem resistance KPC NOT DETECTED NOT DETECTED Final   Carbapenem resistance NDM NOT DETECTED NOT DETECTED Final   Carbapenem resist OXA 48 LIKE NOT DETECTED NOT DETECTED Final   Carbapenem resistance VIM NOT DETECTED NOT DETECTED Final    Comment: Performed at [facility], [address], [place], [place] [address]  Resp Panel by RT-PCR (Flu A&B, Covid) Nasopharyngeal Swab     Status: None   Collection Time: 07/31/20  5:39 PM   Specimen: Nasopharyngeal Swab; Nasopharyngeal(NP) swabs in vial transport medium  Result Value Ref Range Status   SARS Coronavirus 2 by RT PCR NEGATIVE NEGATIVE Final    Comment: (NOTE) SARS-CoV-2 target nucleic acids are NOT DETECTED.  The SARS-CoV-2 RNA is generally detectable in upper respiratory specimens during the acute phase of infection. The lowest concentration of SARS-CoV-2 viral copies this assay can detect is 138 copies/mL. A negative result does not preclude SARS-Cov-2 infection and should not be used as the sole basis for treatment or other patient management decisions. A negative result may occur with  improper specimen collection/handling, submission of specimen other than nasopharyngeal swab, presence of viral mutation(s) within the areas targeted by this assay, and inadequate number of viral copies(<138 copies/mL). A negative result must be combined with clinical observations, patient history, and epidemiological information.  The expected result is Negative.  Fact Sheet for Patients:  BloggerCourse.com  Fact Sheet for Healthcare Providers:  SeriousBroker.it  This test is no t yet approved or cleared by the [place] FDA and  has been authorized for detection and/or diagnosis of SARS-CoV-2 by FDA under an Emergency Use Authorization (EUA). This EUA will remain  in effect (meaning this test can be used) for the  duration of the COVID-19 declaration under Section 564(b)(1) of the Act, 21 U.S.C.section 360bbb-3(b)(1), unless the authorization is terminated  or revoked sooner.       Influenza A by PCR NEGATIVE NEGATIVE Final   Influenza B by PCR NEGATIVE NEGATIVE Final    Comment: (NOTE) The Xpert Xpress SARS-CoV-2/FLU/RSV plus assay is intended as an aid in the diagnosis of influenza from Nasopharyngeal swab specimens and should not be used as a sole basis for treatment. Nasal washings and aspirates are unacceptable for Xpert Xpress SARS-CoV-2/FLU/RSV testing.  Fact Sheet for Patients: BloggerCourse.com  Fact Sheet for Healthcare Providers: SeriousBroker.it  This test is not yet approved or cleared by the [place] FDA and has been authorized for detection and/or diagnosis of SARS-CoV-2 by FDA under an Emergency Use Authorization (EUA). This EUA will remain in effect (meaning this test can be used) for the duration of the COVID-19 declaration under Section 564(b)(1) of the Act, 21 U.S.C. section 360bbb-3(b)(1), unless the authorization is terminated or revoked.  Performed at [facility], [address] [place], [place] [address]   Culture, blood (single)     Status: None   Collection Time: 07/31/20  8:23 PM   Specimen: BLOOD RIGHT WRIST  Result Value Ref Range Status   Specimen Description BLOOD RIGHT WRIST  Final   Special Requests   Final    BOTTLES DRAWN AEROBIC AND ANAEROBIC Blood Culture results may not be optimal due to an excessive volume of blood received in culture bottles   Culture   Final    NO GROWTH 5 DAYS Performed at [facility], [address], [place], [place] [address]    Report Status 08/05/2020 FINAL  Final  Urine Culture     Status: Abnormal   Collection Time: 08/01/20 11:28 AM   Specimen: Urine, Clean Catch  Result Value Ref Range Status   Specimen Description   Final    URINE, CLEAN CATCH Performed at  [facility], [address], [place], [place] [address]    Special Requests   Final    NONE Performed at [facility], [address], [place], [place] [address]    Culture (A)  Final    <10,000 COLONIES/mL INSIGNIFICANT GROWTH Performed at [facility], [address], [place], [place] [address]    Report Status 08/02/2020 FINAL  Final  MRSA PCR Screening     Status: None   Collection Time: 08/01/20  3:26 PM   Specimen: Nasopharyngeal  Result Value Ref Range Status   MRSA by PCR NEGATIVE NEGATIVE Final    Comment:        The GeneXpert MRSA Assay (FDA approved for NASAL specimens only), is one component of a comprehensive MRSA colonization surveillance program. It is not intended to diagnose MRSA infection nor to guide or monitor treatment for MRSA infections. Performed at [facility], [address] [place], [place] [address]   Aerobic/Anaerobic Culture (surgical/deep wound)     Status: None   Collection Time: 08/01/20  7:37 PM   Specimen: Urine, Catheterized  Result Value Ref Range  Status   Specimen Description   Final    URINE, CATHETERIZED URINE FROM DRAIN Performed at [facility], [address], [place], [place] [address]    Special Requests   Final    NONE Performed at [facility], [address] [place], [place] [address]    Gram Stain   Final    ABUNDANT WBC PRESENT, PREDOMINANTLY PMN RARE GRAM NEGATIVE RODS RARE GRAM POSITIVE RODS    Culture   Final    FEW ESCHERICHIA COLI NO ANAEROBES ISOLATED Performed at [facility], [address] [place], [place] [address]    Report Status 08/07/2020 FINAL  Final   Organism ID, Bacteria ESCHERICHIA COLI  Final      Susceptibility   Escherichia coli - MIC*    AMPICILLIN 4 SENSITIVE Sensitive     CEFAZOLIN <=4 SENSITIVE Sensitive     CEFEPIME <=0.12 SENSITIVE Sensitive     CEFTRIAXONE <=0.25 SENSITIVE Sensitive     CIPROFLOXACIN <=0.25  SENSITIVE Sensitive     GENTAMICIN <=1 SENSITIVE Sensitive     IMIPENEM <=0.25 SENSITIVE Sensitive     NITROFURANTOIN <=16 SENSITIVE Sensitive     TRIMETH/SULFA <=20 SENSITIVE Sensitive     AMPICILLIN/SULBACTAM <=2 SENSITIVE Sensitive     PIP/TAZO <=4 SENSITIVE Sensitive     * FEW ESCHERICHIA COLI  Aerobic/Anaerobic Culture (surgical/deep wound)     Status: None   Collection Time: 08/01/20  7:38 PM   Specimen: Abscess  Result Value Ref Range Status   Specimen Description   Final    ABSCESS Performed at [facility], [address], [place], [place] [address]    Special Requests   Final    NONE Performed at [facility], [address] [place], [place] [address]    Gram Stain   Final    ABUNDANT WBC PRESENT, PREDOMINANTLY PMN MODERATE GRAM NEGATIVE RODS    Culture   Final    ABUNDANT ESCHERICHIA COLI NO ANAEROBES ISOLATED Performed at [facility], [address] [place], [place] [address]    Report Status 08/07/2020 FINAL  Final   Organism ID, Bacteria ESCHERICHIA COLI  Final      Susceptibility   Escherichia coli - MIC*    AMPICILLIN <=2 SENSITIVE Sensitive     CEFAZOLIN <=4 SENSITIVE Sensitive     CEFEPIME <=0.12 SENSITIVE Sensitive     CEFTAZIDIME <=1 SENSITIVE Sensitive     CEFTRIAXONE <=0.25 SENSITIVE Sensitive     CIPROFLOXACIN <=0.25 SENSITIVE Sensitive     GENTAMICIN <=1 SENSITIVE Sensitive     IMIPENEM <=0.25 SENSITIVE Sensitive     TRIMETH/SULFA <=20 SENSITIVE Sensitive     AMPICILLIN/SULBACTAM <=2 SENSITIVE Sensitive     PIP/TAZO <=4 SENSITIVE Sensitive     * ABUNDANT ESCHERICHIA COLI    Radiographs and labs were personally reviewed by me.   Johny Sax, MD [facility] [phone] 08/09/2020, 2:50 PM

## 2020-08-09 NOTE — Progress Notes (Signed)
Peripherally Inserted Central Catheter Placement  The IV Nurse has discussed with the patient and/or persons authorized to consent for the patient, the purpose of this procedure and the potential benefits and risks involved with this procedure.  The benefits include less needle sticks, lab draws from the catheter, and the patient may be discharged home with the catheter. Risks include, but not limited to, infection, bleeding, blood clot (thrombus formation), and puncture of an artery; nerve damage and irregular heartbeat and possibility to perform a PICC exchange if needed/ordered by physician.  Alternatives to this procedure were also discussed.  Bard Power PICC patient education guide, fact sheet on infection prevention and patient information card has been provided to patient /or left at bedside.    PICC Placement Documentation  PICC Double Lumen 08/09/20 PICC Right Basilic 38 cm 0 cm (Active)  Indication for Insertion or Continuance of Line Poor Vasculature-patient has had multiple peripheral attempts or PIVs lasting less than 24 hours 08/09/20 1643  Exposed Catheter (cm) 0 cm 08/09/20 1643  Site Assessment Clean;Dry;Intact 08/09/20 1643  Lumen #1 Status Flushed;Saline locked;Blood return noted 08/09/20 1643  Lumen #2 Status Flushed;Saline locked;Blood return noted 08/09/20 1643  Dressing Type Transparent 08/09/20 1643  Dressing Status Clean;Dry;Intact 08/09/20 1643  Antimicrobial disc in place? Yes 08/09/20 1643  Safety Lock Not Applicable 08/09/20 1643  Dressing Intervention New dressing 08/09/20 1643  Dressing Change Due 08/16/20 08/09/20 1643       Ethelda Chick 08/09/2020, 4:45 PM

## 2020-08-10 DIAGNOSIS — N151 Renal and perinephric abscess: Secondary | ICD-10-CM

## 2020-08-10 DIAGNOSIS — E876 Hypokalemia: Secondary | ICD-10-CM

## 2020-08-10 LAB — CBC WITH DIFFERENTIAL/PLATELET
Abs Immature Granulocytes: 0.04 10*3/uL (ref 0.00–0.07)
Basophils Absolute: 0 10*3/uL (ref 0.0–0.1)
Basophils Relative: 0 %
Eosinophils Absolute: 0.1 10*3/uL (ref 0.0–0.5)
Eosinophils Relative: 1 %
HCT: 28.3 % — ABNORMAL LOW (ref 36.0–46.0)
Hemoglobin: 9 g/dL — ABNORMAL LOW (ref 12.0–15.0)
Immature Granulocytes: 0 %
Lymphocytes Relative: 11 %
Lymphs Abs: 1.1 10*3/uL (ref 0.7–4.0)
MCH: 30.1 pg (ref 26.0–34.0)
MCHC: 31.8 g/dL (ref 30.0–36.0)
MCV: 94.6 fL (ref 80.0–100.0)
Monocytes Absolute: 0.6 10*3/uL (ref 0.1–1.0)
Monocytes Relative: 6 %
Neutro Abs: 8.1 10*3/uL — ABNORMAL HIGH (ref 1.7–7.7)
Neutrophils Relative %: 82 %
Platelets: 296 10*3/uL (ref 150–400)
RBC: 2.99 MIL/uL — ABNORMAL LOW (ref 3.87–5.11)
RDW: 14.8 % (ref 11.5–15.5)
WBC: 10 10*3/uL (ref 4.0–10.5)
nRBC: 0 % (ref 0.0–0.2)

## 2020-08-10 LAB — COMPREHENSIVE METABOLIC PANEL
ALT: 11 U/L (ref 0–44)
AST: 22 U/L (ref 15–41)
Albumin: 2.2 g/dL — ABNORMAL LOW (ref 3.5–5.0)
Alkaline Phosphatase: 154 U/L — ABNORMAL HIGH (ref 38–126)
Anion gap: 9 (ref 5–15)
BUN: 20 mg/dL (ref 6–20)
CO2: 27 mmol/L (ref 22–32)
Calcium: 8 mg/dL — ABNORMAL LOW (ref 8.9–10.3)
Chloride: 103 mmol/L (ref 98–111)
Creatinine, Ser: 0.91 mg/dL (ref 0.44–1.00)
GFR, Estimated: >60 mL/min (ref >60)
Glucose, Bld: 132 mg/dL — ABNORMAL HIGH (ref 70–99)
Potassium: 3 mmol/L — ABNORMAL LOW (ref 3.5–5.1)
Sodium: 139 mmol/L (ref 135–145)
Total Bilirubin: 0.5 mg/dL (ref 0.3–1.2)
Total Protein: 5.2 g/dL — ABNORMAL LOW (ref 6.5–8.1)

## 2020-08-10 LAB — PHOSPHORUS: Phosphorus: 2.9 mg/dL (ref 2.5–4.6)

## 2020-08-10 LAB — GLUCOSE, CAPILLARY
Glucose-Capillary: 114 mg/dL — ABNORMAL HIGH (ref 70–99)
Glucose-Capillary: 179 mg/dL — ABNORMAL HIGH (ref 70–99)
Glucose-Capillary: 133 mg/dL — ABNORMAL HIGH (ref 70–99)
Glucose-Capillary: 121 mg/dL — ABNORMAL HIGH (ref 70–99)

## 2020-08-10 LAB — MAGNESIUM: Magnesium: 2.1 mg/dL (ref 1.7–2.4)

## 2020-08-10 MED ORDER — POTASSIUM CHLORIDE CRYS ER 20 MEQ PO TBCR
40.0000 meq | EXTENDED_RELEASE_TABLET | Freq: Two times a day (BID) | ORAL | Status: DC
  Administered 2020-08-10 – 2020-08-17 (×14): 40 meq via ORAL
  Filled 2020-08-10 (×14): qty 2

## 2020-08-10 MED ORDER — POTASSIUM CHLORIDE CRYS ER 20 MEQ PO TBCR
20.0000 meq | EXTENDED_RELEASE_TABLET | Freq: Two times a day (BID) | ORAL | Status: DC
  Filled 2020-08-10: qty 1

## 2020-08-10 NOTE — Plan of Care (Signed)
  Problem: Activity: Goal: Risk for activity intolerance will decrease Outcome: Progressing   

## 2020-08-10 NOTE — Progress Notes (Signed)
PROGRESS NOTE    Tammie AppleLinda [name]  WJX:[id number] DOB: 02/27/1961 DOA: 07/31/2020 PCP: Patient, No Pcp Per    Chief Complaint  Patient presents with  . Abdominal Pain    Brief Narrative: 60 year old female admitted 12/24 with chief complaint of abdominal pain.  Found to have acute left pyelonephrosis with abscess, severe sepsis, acute kidney injury, hyper osmolar nonketotic hyperglycemia, severe hyponatremia. In the ED: CT imaging showed multiple pulmonary nodules, imaging of abdomen pelvis With left hydronephrosis and evidence of perinephritic abscess; Was  transferred to [facility] for placement of left nephrostomy tube and left perirenal drain; Critical care asked to admit given severe hyponatremia and concern for clinical decompensation  Assessment & Plan:   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 with type 2 diabetes mellitus (HCC)   Ileus (HCC)  Severe sepsis with E. coli bacteremia/left emphysematous pyelonephritis with perinephric abscess - Status post percutaneous nephrostomy and perirenal abscess drainage 08/01/2020.  - Cervix antibiotics able to narrowed down to IV Ancef.   - Urology following and repeat CT abdomen and pelvis which was done negative for ileus however did show worsening perinephric abscesses, and questionable hemorrhage, complicated emphysematous left-sided pyelonephritis with nonocclusive thrombus forming in the left renal vein likely represents source of emboli.   - As patient clinically improving  urology recommending to continue current regimen of IV antibiotics, supportive care and repeat CT abdomen and pelvis in the next 24 hours.   - CT chest concerning for numerous large pulmonary nodules or masses bilaterally compatible with extensive septic emboli with multifocal pulmonary abscess formation.   - ID following given worsening imaging.   - IR following and managing drains  AKI without known history of CKD - Underlying baseline renal function unknown.   - Likely multifactorial secondary to emphysematous pyelonephritis with abscess, ischemic ATN in the setting of volume depletion.   - Status post percutaneous nephrostomy and perirenal abscess drainage.   - Foley catheter in place.  - Repeat CT abdomen and pelvis with worsening abdominal abscesses however patient improving clinically.  Urology following.   Questionable ileus, resolving - Abdominal films obtained concerning for possible ileus.  CT abdomen and pelvis obtained negative for ileus however did show worsening perinephric abscesses and questionable hemorrhage, complicated emphysematous left-sided pyelonephritis with nonocclusive thrombus forming in the left renal vein likely represents source of emboli.  - Clinically patient is improving clinically -denies abdominal distention - Continue to advance diet as tolerated  Hypokalemia/hypomagnesemia Potassium repleted again today; mag/phos within normal limits  Newly diagnosed diabetes mellitus type 2/HHNK Hemoglobin A1c noted at 12.2.   Continue Lantus 8 units twice daily.  Sliding scale insulin.  Diabetic coordinator following.   Severe hyponatremia, resolved Unclear etiology, potential for hypovolemic hyponatremia  Initially treated with 3% normal saline which has subsequently been discontinued.  Continue to advance diet as tolerated  Multiple pulmonary nodules Differential includes septic emboli versus metastatic malignancy. Pulmonary recommended when repeat CT is done  to get a repeat chest at that same time.  Patient will need outpatient follow-up with pulmonary.  CT chest done with numerous large pulmonary nodules and masses bilaterally in the lungs with characteristics compatible with septic emboli and multiple pulmonary abscess formation, moderate bilateral pleural effusions.Case discussed with PCCM who reviewed CT findings and when agreement secondary to septic emboli recommended continuation of IV antibiotics with close outpatient follow-up with pulmonary.  Per pulmonary patient will need repeat CT chest done in approximately 6 weeks.   Bibasilar crackles/moderate bilateral pleural effusion Noted on physical exam.  Chest x-ray done with increased right upper lobe airspace opacity, stable small left pleural effusion and left basilar atelectasis versus infiltrate.  Probable small layering right pleural effusion.  CT chest done with moderate bilateral pleural effusions.  Will saline lock IV fluids.  Trial of Lasix 40 mg IV every 12 hours x2 doses.  Strict I's and O's.  Daily weights.  Monitor renal function closely.    Questionable ileus, resolving Patient during the hospitalization had some diffuse abdominal pain.  Abdominal films which were done were concerning for ileus.  Electrolytes were repleted.  CT abdomen and pelvis which was done negative for ileus.  Patient having bowel movements and passing flatus.  Patient started on clears and would advance to a carb modified diet.     DVT prophylaxis: Heparin Code Status: Full Family Communication: Updated patient and husband at bedside. Disposition:   Status is: Inpatient  Dispo: The patient is from: Home              Anticipated d/c is to: To be determined              Anticipated d/c date is: To be determined.              Patient currently on IV antibiotics, has percutaneous nephrostomy drains, not stable for discharge.   Consultants:   Urology: Dr. Arita Miss 08/01/2020  Nephrology: Dr. Arrie Aran  08/01/2020  General surgery: Dr. Henreitta Leber 08/01/2020([facility])   Significant Hospital Events:  12/24 admitted with chief complaint of abdominal pain.  Diagnostic imaging from CT abdomen pelvis showing left hydronephrosis, pyelonephritis, and perinephritic abscess.  Blood cultures preliminarily showing gram-negative rods, CT chest with multiple pulmonary nodules.Presenting blood glucose 706, presenting sodium 1 less than 102.  Beta hydroxybutyric acid was negative.  Was given IV hydration.  Started on empiric antibiotics.  Transferred to [facility] following urology consultation recommending left nephrostomy tube and perinephritic drain.  Critical care asked to admit: Left PCN in left perirenal abscess drain both placed on 12/24 12/25 Sodium improved from 10 2-1 1 4   , 3% saline stopped at 3 AM, placed on bicarbonate infusion. Off pressors 12/26-12/29: 12/27: Switched to Ancef given sensitivities bicarb continued. 12/28: Bicarbonate changed to LR 12/29: Doing better.  Transferred to [facility] hospitalist for pickup 08/07/2020  12/30-present: awaiting further imaging per specialists which will dictate current course  Procedures:  12/24 IR >> CT guided perc nephrostomy ,CT guided L pelvic abscess drain placement    Significant Diagnostic Tests:  CT chest abdomen and pelvis 12/24: Multiple bilateral pulmonary nodules up to 12 mm in diameter, they are ill-defined and shaggy in appearance 4 mm left UPJ calculus with mild left hydronephrosis and hydroureter the left kidney is emphysematous with a 6.4 x 3.6 x 5.9 diameter abscess CT head: Negative  abdominal films 08/07/2020, 08/08/2020 CT chest abdomen and pelvis 08/08/2020  Antimicrobials:  Azithromycin and ceftriaxone 12/23 for 1 dose Zosyn 12/24>> 12/25 12/25 ceftx >> 12/27 Ancef 12/28 >  ongoing  Micro Data:  Urine culture 12/23 Blood culture x2 was 12/23: E coli  Abscess cx 12/24 >> GNR >> abundant E. coli Respiratory panel by PCR  12/23: Negative for Covid and influenza. Catheterized urine from drain 08/01/2020>>>> E. coli  Subjective: No acute issues or events overnight denies nausea vomiting diarrhea constipation headache fevers or chills.  Somewhat still fatigued and weak with exertion but able to ambulate with walker and husband today without diffulty  Objective: Vitals:   08/09/20 1230 08/09/20 2023 08/10/20 0500 08/10/20 0535  BP: 124/60 (!) 147/75  (!) 115/54  Pulse: 89 90  92  Resp: 20 16  19   Temp: 98.3 F (36.8 C) 98.4 F (36.9 C)  98 F (36.7 C)  TempSrc: Oral Oral  Oral  SpO2: 96% 95%  94%  Weight:   81.7 kg   Height:        Intake/Output Summary (Last 24 hours) at 08/10/2020 0841 Last data filed at 08/10/2020 10/08/2020 Gross per 24 hour  Intake 23 ml  Output 3675 ml  Net -3652 ml   Filed Weights   08/01/20 0000 08/07/20 1100 08/10/20 0500  Weight: 81.1 kg 80.6 kg 81.7 kg    Examination:  General:  Pleasantly resting in bed, No acute distress. HEENT:  Normocephalic atraumatic.  Sclerae nonicteric, noninjected.  Extraocular movements intact bilaterally. Neck:  Without mass or deformity.  Trachea is midline. Lungs:  Clear to auscultate bilaterally without rhonchi, wheeze, or rales. Heart:  Regular rate and rhythm.  Without murmurs, rubs, or gallops. Abdomen:  Soft, nontender, nondistended.  Without guarding or rebound. Nephrostomy tube draining dark brown liquid Extremities: Without cyanosis, clubbing, edema, or obvious deformity. Vascular:  Dorsalis pedis and posterior tibial pulses palpable bilaterally. Skin:  Warm and dry, no erythema, no ulcerations.    Data Reviewed: I have personally reviewed following labs and imaging studies  CBC: Recent Labs  Lab 08/06/20 0220 08/07/20 0506 08/08/20 0823 08/09/20 0528 08/10/20 0522  WBC 15.5* 15.2* 11.1* 10.6* 10.0  NEUTROABS  --   --  9.1* 8.9* 8.1*  HGB 9.6* 10.6* 9.9* 9.0* 9.0*  HCT 29.6* 33.0* 30.8* 28.0* 28.3*  MCV 92.8 95.1 94.5  96.2 94.6  PLT 308 317 327 297 296    Basic Metabolic Panel: Recent Labs  Lab 08/05/20 0750 08/06/20 0220 08/07/20 0506 08/07/20 0758 08/08/20 0502 08/08/20 0823 08/09/20 0528 08/10/20 0522  NA 133* 135 135  --   --  138 137 139  K 3.1* 3.8 3.2*  --   --  3.1* 3.6 3.0*  CL 89* 94* 94*  --   --  97* 100 103  CO2 30 29 29   --   --  30 28 27   GLUCOSE 146* 87 113*  --   --  80 126* 132*  BUN 74* 56* 41*  --   --  32* 24* 20  CREATININE 1.84* 1.61* 1.27*  --   --  0.98 1.03* 0.91  CALCIUM 8.1* 8.1* 8.3*  --   --  8.1* 8.0* 8.0*  MG 1.8 1.6*  --  1.6* 2.0  --  1.6* 2.1  PHOS 4.1 3.4  --   --   --  2.8 2.9 2.9    GFR: Estimated Creatinine Clearance: 65.9 mL/min (by C-G formula based on SCr of 0.91 mg/dL).  Liver Function Tests: Recent Labs  Lab 08/08/20 0823 08/09/20 0528 08/10/20 0522  AST  --  22 22  ALT  --  8 11  ALKPHOS  --  165* 154*  BILITOT  --  0.7 0.5  PROT  --  5.1* 5.2*  ALBUMIN 2.0* 2.0* 2.2*    CBG: Recent Labs  Lab 08/09/20 0739 08/09/20 1119 08/09/20 1620 08/09/20 2021 08/10/20 0752  GLUCAP 117* 230* 154* 150* 114*     Recent Results (from the past 240 hour(s))  Culture, blood (single)     Status: Abnormal   Collection Time: 07/31/20  5:26 PM   Specimen: BLOOD  Result Value Ref Range Status   Specimen Description   Final    BLOOD LEFT ANTECUBITAL Performed at [facility], [address] [place], [place] [address]    Special Requests   Final    BOTTLES DRAWN AEROBIC AND ANAEROBIC Blood Culture adequate volume Performed at [facility], [address], [place], [place] [address]    Culture  Setup Time   Final    GRAM NEGATIVE RODS AEROBIC AND ANEROBIC BOTTLES Gram Stain Report Called to,Read Back By and Verified With: LARIMORE, A. 12/24 @ 0740 BEARD, S.  CRITICAL RESULT CALLED TO, READ BACK BY AND VERIFIED WITH: PHARMD  Lucita Lora [id number] 1921 FCP Performed at [facility], [address] [place], [place] [address]    Culture  ESCHERICHIA COLI (A)  Final   Report Status 08/03/2020 FINAL  Final   Organism ID, Bacteria ESCHERICHIA COLI  Final      Susceptibility   Escherichia coli - MIC*    AMPICILLIN <=2 SENSITIVE Sensitive     CEFAZOLIN <=4 SENSITIVE Sensitive     CEFEPIME <=0.12 SENSITIVE Sensitive     CEFTAZIDIME <=1 SENSITIVE Sensitive     CEFTRIAXONE <=0.25 SENSITIVE Sensitive     CIPROFLOXACIN <=0.25 SENSITIVE Sensitive     GENTAMICIN <=1 SENSITIVE Sensitive     IMIPENEM <=0.25 SENSITIVE Sensitive     TRIMETH/SULFA <=20 SENSITIVE Sensitive     AMPICILLIN/SULBACTAM <=2 SENSITIVE Sensitive     PIP/TAZO <=4 SENSITIVE Sensitive     * ESCHERICHIA COLI  Blood Culture ID Panel (Reflexed)     Status: Abnormal   Collection Time: 07/31/20  5:26 PM  Result Value Ref Range Status   Enterococcus faecalis NOT DETECTED NOT DETECTED Final   Enterococcus Faecium NOT DETECTED NOT DETECTED Final   Listeria monocytogenes NOT DETECTED NOT DETECTED Final   Staphylococcus species NOT DETECTED NOT DETECTED Final   Staphylococcus aureus (BCID) NOT DETECTED NOT DETECTED Final   Staphylococcus epidermidis NOT DETECTED NOT DETECTED Final   Staphylococcus lugdunensis NOT DETECTED NOT DETECTED Final   Streptococcus species NOT DETECTED NOT DETECTED Final   Streptococcus agalactiae NOT DETECTED NOT DETECTED Final   Streptococcus pneumoniae NOT DETECTED NOT DETECTED Final   Streptococcus pyogenes NOT DETECTED NOT DETECTED Final   A.calcoaceticus-baumannii NOT DETECTED NOT DETECTED Final   Bacteroides fragilis NOT DETECTED NOT DETECTED Final   Enterobacterales DETECTED (A) NOT DETECTED Final    Comment: Enterobacterales represent a large order of gram negative bacteria, not a single organism. CRITICAL RESULT CALLED TO, READ BACK BY AND VERIFIED WITH: PHARMD  TERRY G. [id number] 1921 FCP    Enterobacter cloacae complex NOT DETECTED NOT DETECTED Final   Escherichia coli DETECTED (A) NOT DETECTED Final    Comment: CRITICAL RESULT  CALLED TO, READ BACK BY AND VERIFIED WITH: PHARMD  TERRY G. [id number] 1921 FCP    Klebsiella aerogenes NOT DETECTED NOT DETECTED Final   Klebsiella oxytoca NOT DETECTED NOT DETECTED Final   Klebsiella pneumoniae NOT DETECTED NOT DETECTED Final  Proteus species NOT DETECTED NOT DETECTED Final   Salmonella species NOT DETECTED NOT DETECTED Final   Serratia marcescens NOT DETECTED NOT DETECTED Final   Haemophilus influenzae NOT DETECTED NOT DETECTED Final   Neisseria meningitidis NOT DETECTED NOT DETECTED Final   Pseudomonas aeruginosa NOT DETECTED NOT DETECTED Final   Stenotrophomonas maltophilia NOT DETECTED NOT DETECTED Final   Candida albicans NOT DETECTED NOT DETECTED Final   Candida auris NOT DETECTED NOT DETECTED Final   Candida glabrata NOT DETECTED NOT DETECTED Final   Candida krusei NOT DETECTED NOT DETECTED Final   Candida parapsilosis NOT DETECTED NOT DETECTED Final   Candida tropicalis NOT DETECTED NOT DETECTED Final   Cryptococcus neoformans/gattii NOT DETECTED NOT DETECTED Final   CTX-M ESBL NOT DETECTED NOT DETECTED Final   Carbapenem resistance IMP NOT DETECTED NOT DETECTED Final   Carbapenem resistance KPC NOT DETECTED NOT DETECTED Final   Carbapenem resistance NDM NOT DETECTED NOT DETECTED Final   Carbapenem resist OXA 48 LIKE NOT DETECTED NOT DETECTED Final   Carbapenem resistance VIM NOT DETECTED NOT DETECTED Final    Comment: Performed at [facility], [address] [place], [place] [address]  Resp Panel by RT-PCR (Flu A&B, Covid) Nasopharyngeal Swab     Status: None   Collection Time: 07/31/20  5:39 PM   Specimen: Nasopharyngeal Swab; Nasopharyngeal(NP) swabs in vial transport medium  Result Value Ref Range Status   SARS Coronavirus 2 by RT PCR NEGATIVE NEGATIVE Final    Comment: (NOTE) SARS-CoV-2 target nucleic acids are NOT DETECTED.  The SARS-CoV-2 RNA is generally detectable in upper respiratory specimens during the acute phase of infection. The  lowest concentration of SARS-CoV-2 viral copies this assay can detect is 138 copies/mL. A negative result does not preclude SARS-Cov-2 infection and should not be used as the sole basis for treatment or other patient management decisions. A negative result may occur with  improper specimen collection/handling, submission of specimen other than nasopharyngeal swab, presence of viral mutation(s) within the areas targeted by this assay, and inadequate number of viral copies(<138 copies/mL). A negative result must be combined with clinical observations, patient history, and epidemiological information. The expected result is Negative.  Fact Sheet for Patients:  BloggerCourse.com  Fact Sheet for Healthcare Providers:  SeriousBroker.it  This test is no t yet approved or cleared by the [place] FDA and  has been authorized for detection and/or diagnosis of SARS-CoV-2 by FDA under an Emergency Use Authorization (EUA). This EUA will remain  in effect (meaning this test can be used) for the duration of the COVID-19 declaration under Section 564(b)(1) of the Act, 21 U.S.C.section 360bbb-3(b)(1), unless the authorization is terminated  or revoked sooner.       Influenza A by PCR NEGATIVE NEGATIVE Final   Influenza B by PCR NEGATIVE NEGATIVE Final    Comment: (NOTE) The Xpert Xpress SARS-CoV-2/FLU/RSV plus assay is intended as an aid in the diagnosis of influenza from Nasopharyngeal swab specimens and should not be used as a sole basis for treatment. Nasal washings and aspirates are unacceptable for Xpert Xpress SARS-CoV-2/FLU/RSV testing.  Fact Sheet for Patients: BloggerCourse.com  Fact Sheet for Healthcare Providers: SeriousBroker.it  This test is not yet approved or cleared by the [place] FDA and has been authorized for detection and/or diagnosis of SARS-CoV-2 by FDA under  an Emergency Use Authorization (EUA). This EUA will remain in effect (meaning this test can be used) for the duration of the COVID-19 declaration under Section 564(b)(1)  of the Act, 21 U.S.C. section 360bbb-3(b)(1), unless the authorization is terminated or revoked.  Performed at [facility], [address] [place], [place] [address]   Culture, blood (single)     Status: None   Collection Time: 07/31/20  8:23 PM   Specimen: BLOOD RIGHT WRIST  Result Value Ref Range Status   Specimen Description BLOOD RIGHT WRIST  Final   Special Requests   Final    BOTTLES DRAWN AEROBIC AND ANAEROBIC Blood Culture results may not be optimal due to an excessive volume of blood received in culture bottles   Culture   Final    NO GROWTH 5 DAYS Performed at [facility], [address] [place], [place] [address]    Report Status 08/05/2020 FINAL  Final  Urine Culture     Status: Abnormal   Collection Time: 08/01/20 11:28 AM   Specimen: Urine, Clean Catch  Result Value Ref Range Status   Specimen Description   Final    URINE, CLEAN CATCH Performed at [facility], [address] [place], [place] [address]    Special Requests   Final    NONE Performed at [facility], [address] [place], [place] [address]    Culture (A)  Final    <10,000 COLONIES/mL INSIGNIFICANT GROWTH Performed at [facility], [address] [place], [place] [address]    Report Status 08/02/2020 FINAL  Final  MRSA PCR Screening     Status: None   Collection Time: 08/01/20  3:26 PM   Specimen: Nasopharyngeal  Result Value Ref Range Status   MRSA by PCR NEGATIVE NEGATIVE Final    Comment:        The GeneXpert MRSA Assay (FDA approved for NASAL specimens only), is one component of a comprehensive MRSA colonization surveillance program. It is not intended to diagnose MRSA infection nor to guide or monitor treatment for MRSA infections. Performed at [facility], [address]  [place], [place] [address]   Aerobic/Anaerobic Culture (surgical/deep wound)     Status: None   Collection Time: 08/01/20  7:37 PM   Specimen: Urine, Catheterized  Result Value Ref Range Status   Specimen Description   Final    URINE, CATHETERIZED URINE FROM DRAIN Performed at [facility], [address] [place], [place] [address]    Special Requests   Final    NONE Performed at [facility], [address] [place], [place] [address]    Gram Stain   Final    ABUNDANT WBC PRESENT, PREDOMINANTLY PMN RARE GRAM NEGATIVE RODS RARE GRAM POSITIVE RODS    Culture   Final    FEW ESCHERICHIA COLI NO ANAEROBES ISOLATED Performed at [facility], [address] [place], [place] [address]    Report Status 08/07/2020 FINAL  Final   Organism ID, Bacteria ESCHERICHIA COLI  Final      Susceptibility   Escherichia coli - MIC*    AMPICILLIN 4 SENSITIVE Sensitive     CEFAZOLIN <=4 SENSITIVE Sensitive     CEFEPIME <=0.12 SENSITIVE Sensitive     CEFTRIAXONE <=0.25 SENSITIVE Sensitive     CIPROFLOXACIN <=0.25 SENSITIVE Sensitive     GENTAMICIN <=1 SENSITIVE Sensitive     IMIPENEM <=0.25 SENSITIVE Sensitive     NITROFURANTOIN <=16 SENSITIVE Sensitive     TRIMETH/SULFA <=20 SENSITIVE Sensitive     AMPICILLIN/SULBACTAM <=2 SENSITIVE Sensitive     PIP/TAZO <=4 SENSITIVE Sensitive     *  FEW ESCHERICHIA COLI  Aerobic/Anaerobic Culture (surgical/deep wound)     Status: None   Collection Time: 08/01/20  7:38 PM   Specimen: Abscess  Result Value Ref Range Status   Specimen Description   Final    ABSCESS Performed at [facility], [address] [place], [place] [address]    Special Requests   Final    NONE Performed at [facility], [address], [place], [place] [address]    Gram Stain   Final    ABUNDANT WBC PRESENT, PREDOMINANTLY PMN MODERATE GRAM NEGATIVE RODS    Culture   Final    ABUNDANT ESCHERICHIA  COLI NO ANAEROBES ISOLATED Performed at [facility], [address], [place], [place] [address]    Report Status 08/07/2020 FINAL  Final   Organism ID, Bacteria ESCHERICHIA COLI  Final      Susceptibility   Escherichia coli - MIC*    AMPICILLIN <=2 SENSITIVE Sensitive     CEFAZOLIN <=4 SENSITIVE Sensitive     CEFEPIME <=0.12 SENSITIVE Sensitive     CEFTAZIDIME <=1 SENSITIVE Sensitive     CEFTRIAXONE <=0.25 SENSITIVE Sensitive     CIPROFLOXACIN <=0.25 SENSITIVE Sensitive     GENTAMICIN <=1 SENSITIVE Sensitive     IMIPENEM <=0.25 SENSITIVE Sensitive     TRIMETH/SULFA <=20 SENSITIVE Sensitive     AMPICILLIN/SULBACTAM <=2 SENSITIVE Sensitive     PIP/TAZO <=4 SENSITIVE Sensitive     * ABUNDANT ESCHERICHIA COLI         Radiology Studies: CT CHEST ABDOMEN PELVIS W CONTRAST  Result Date: 08/08/2020 CLINICAL DATA:  60 year old female with history of complicated pyelonephritis. EXAM: CT CHEST, ABDOMEN, AND PELVIS WITH CONTRAST TECHNIQUE: Multidetector CT imaging of the chest, abdomen and pelvis was performed following the standard protocol during bolus administration of intravenous contrast. CONTRAST:  OMNIPAQUE IOHEXOL 300 MG/ML  SOLN COMPARISON:  CT the chest, abdomen and pelvis 08/01/2020. FINDINGS: CT CHEST FINDINGS Cardiovascular: Heart size is normal. There is no significant pericardial fluid, thickening or pericardial calcification. There is aortic atherosclerosis, as well as atherosclerosis of the great vessels of the mediastinum and the coronary arteries, including calcified atherosclerotic plaque in the left anterior descending coronary artery. Dilatation of the pulmonic trunk (3.6 cm in diameter). Mediastinum/Nodes: No pathologically enlarged in mediastinal or hilar lymph nodes. Esophagus is unremarkable in appearance. No axillary lymphadenopathy. Lungs/Pleura: Numerous pulmonary nodules and masses are again noted throughout the lungs bilaterally, many of which have  central areas of cavitation. The largest of these is in the posterior aspect of the right upper lobe (axial image 54 of series 5) measuring 3.2 x 3.2 cm, with central low attenuation regions and internal cavitation, compatible with a large intrapulmonary abscess. Overall, findings are strongly suggestive of widespread septic emboli. Moderate bilateral pleural effusions lying dependently with extensive passive atelectasis in the lower lobes of the lungs bilaterally. Musculoskeletal: There are no aggressive appearing lytic or blastic lesions noted in the visualized portions of the skeleton. CT ABDOMEN PELVIS FINDINGS Hepatobiliary: No suspicious cystic or solid hepatic lesions. No intra or extrahepatic biliary ductal dilatation. Gallbladder is normal in appearance. Pancreas: No pancreatic mass. No pancreatic ductal dilatation. No peripancreatic fluid collections or inflammatory changes. Spleen: Unremarkable. Adrenals/Urinary Tract: New nephrostomy tube extends into the left renal pelvis and appears appropriately located. The left kidneys enlarged and very heterogeneous in appearance with multiple hypovascular hypoenhancing regions, compatible with residual areas of pyelonephritis and associated renal and perinephric abscesses. These extend  into the perinephric space where there is a combination of gas and fluid. Some of this tracks caudally where there is a larger collection of predominantly fluid, trace amount of gas, and some higher attenuation material adjacent to the tip of an indwelling drain (best appreciated on axial image 89 of series 2 and coronal image 74 of series 6) which measures approximately 4.3 x 2.1 x 3.6 cm. This perinephric fluid collection trucks caudally into the left hemipelvis along the left pelvic sidewall, coming into contact with the left iliacus musculature. Overall, the extent of fluid in this collection appears increased. This is well demonstrated in a relatively well-defined portion of  the collection laterally which currently measures 9.5 x 3.6 cm (axial image 78 of series 2) and previously measured only 8.2 x 2.4 cm when measured in a similar fashion on 08/01/2020. Right kidney and bilateral adrenal glands are normal in appearance. No right hydroureteronephrosis. Urinary bladder is completely decompressed around an indwelling Foley balloon catheter. Small amount of gas present in the lumen of the urinary bladder is presumably iatrogenic. Stomach/Bowel: The appearance of the stomach is normal. There is no pathologic dilatation of small bowel or colon. Normal appendix. Vascular/Lymphatic: Aortic atherosclerosis, without evidence of aneurysm or dissection in the abdominal or pelvic vasculature. Filling defect in the left renal vein best appreciated on coronal image 77 of series 6, compatible with nonocclusive left renal vein thrombus. No lymphadenopathy noted in the abdomen or pelvis. Reproductive: Uterus and ovaries are unremarkable in appearance. Other: Diffuse body wall edema. Trace volume of ascites. No pneumoperitoneum. Musculoskeletal: There are no aggressive appearing lytic or blastic lesions noted in the visualized portions of the skeleton. IMPRESSION: 1. Compared to the prior study there is been interval nephrostomy tube placement which appears appropriately located, as well as percutaneous drainage catheter placement in the inferior aspect of the patient's extensive perinephric abscess. Unfortunately, the overall size of the abscess appears increased compared to the prior examination, and there is new high attenuation fluid within the inferior aspect of this collection, which could represent some recent hemorrhage. If there is concern for active hemorrhage, further evaluation with triple-phase CT of the abdomen and pelvis should be considered to evaluate for source of active bleeding. 2. Numerous large pulmonary nodules and masses in the lungs bilaterally, with imaging characteristics most  compatible with extensive septic emboli with multifocal pulmonary abscess formation. 3. 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 This likely represents the source of emboli in this patient with evidence of septic embolization. 4. Moderate bilateral pleural effusions lying dependently with associated areas of passive atelectasis in the lower lobes of the lungs bilaterally. 5. Dilatation of the pulmonic trunk (3.6 cm in diameter), concerning for pulmonary arterial hypertension. 6. Additional incidental findings, as above. These results were called by telephone at the time of interpretation on 08/08/2020 at 5:38 pm to provider [facility], who verbally acknowledged these results. Electronically Signed   By: Trudie Reed M.D.   On: 08/08/2020 17:38   DG Abd 2 Views  Result Date: 08/08/2020 CLINICAL DATA:  Abdominal pain and decreased oral intake. EXAM: ABDOMEN - 2 VIEW COMPARISON:  Plain film of the abdomen dated 08/07/2020. FINDINGS: Mildly distended small bowel within the central abdomen with nonspecific air-fluid levels in the upper pelvis. Air is again seen within the colon. No evidence of renal or ureteral calculi. Two percutaneous pigtail catheters are again seen within the LEFT abdomen. Dense consolidation the LEFT lung  base, presumed pneumonia. IMPRESSION: 1. Mildly distended small bowel within the central abdomen with nonspecific air-fluid levels in the upper pelvis. Findings could represent ileus or partial small bowel obstruction. 2. LEFT lower lung consolidation, pneumonia versus atelectasis. Electronically Signed   By: Franki Cabot M.D.   On: 08/08/2020 10:04   [place] EKG SITE RITE  Result Date: 08/09/2020 If Site Rite image not attached, placement could not be confirmed due to current cardiac rhythm.   Scheduled Meds: . chlorhexidine  15 mL Mouth Rinse BID  . Chlorhexidine Gluconate Cloth  6 each Topical  Daily  . feeding supplement (GLUCERNA SHAKE)  237 mL Oral BID BM  . heparin injection (subcutaneous)  5,000 Units Subcutaneous Q8H  . insulin aspart  0-20 Units Subcutaneous TID WC  . insulin aspart  0-5 Units Subcutaneous QHS  . insulin glargine  8 Units Subcutaneous BID  . mouth rinse  15 mL Mouth Rinse q12n4p  . sodium chloride flush  10-40 mL Intracatheter Q12H  . sodium chloride flush  5 mL Intracatheter Q8H   Continuous Infusions: .  ceFAZolin (ANCEF) IV 2 g (08/10/20 0508)  . sodium chloride       LOS: 10 days   Time spent: 40 minutes  Little Ishikawa, DO [facility]  To contact the attending provider between 7A-7P or the covering provider during after hours 7P-7A, please log into the web site [email or web address] and access using universal [place] password for that web site. If you do not have the password, please call the hospital operator.  08/10/2020, 8:41 AM

## 2020-08-10 NOTE — Progress Notes (Addendum)
INFECTIOUS DISEASE PROGRESS NOTE  ID: Tammie [name] is a 60 y.o. female with  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 with type 2 diabetes mellitus (HCC)   Ileus (HCC)  Subjective: No complaints.  Family member concerned about skin changes on L leg.   Abtx:  Anti-infectives (From admission, onward)   Start     Dose/Rate Route Frequency Ordered Stop   08/05/20 1000  ceFAZolin (ANCEF) IVPB 2g/100 mL premix        2 g 200 mL/hr over 30 Minutes Intravenous Every 8 hours 08/05/20 0903     08/04/20 2200  ceFAZolin (ANCEF) IVPB 2g/100 mL premix  Status:  Discontinued        2 g 200 mL/hr over 30 Minutes Intravenous Every 12 hours 08/04/20 1131 08/05/20 0903   08/02/20 0000  piperacillin-tazobactam (ZOSYN) IVPB 3.375 g  Status:  Discontinued        3.375 g 12.5 mL/hr over 240 Minutes Intravenous Every 12 hours 08/01/20 1403 08/01/20 2006   08/01/20 2100  cefTRIAXone (ROCEPHIN) 2 g in sodium chloride 0.9 % 100 mL IVPB  Status:  Discontinued        2 g 200 mL/hr over 30 Minutes Intravenous Every 24 hours 08/01/20 2006 08/04/20 1131   08/01/20 1430  piperacillin-tazobactam (ZOSYN) IVPB 3.375 g        3.375 g 100 mL/hr over 30 Minutes Intravenous  Once 08/01/20 1355 08/01/20 1502   07/31/20 2000  cefTRIAXone (ROCEPHIN) 2 g in sodium chloride 0.9 % 100 mL IVPB  Status:  Discontinued        2 g 200 mL/hr over 30 Minutes Intravenous Every 24 hours 07/31/20 1947 08/01/20 1351   07/31/20 2000  azithromycin (ZITHROMAX) 500 mg in  sodium chloride 0.9 % 250 mL IVPB  Status:  Discontinued        500 mg 250 mL/hr over 60 Minutes Intravenous Every 24 hours 07/31/20 1947 08/01/20 1351      Medications:  Scheduled: . chlorhexidine  15 mL Mouth Rinse BID  . Chlorhexidine Gluconate Cloth  6 each Topical Daily  . feeding supplement (GLUCERNA SHAKE)  237 mL Oral BID BM  . heparin injection (subcutaneous)  5,000 Units Subcutaneous Q8H  . insulin aspart  0-20 Units Subcutaneous TID WC  . insulin aspart  0-5 Units Subcutaneous QHS  . insulin glargine  8 Units Subcutaneous BID  . mouth rinse  15 mL Mouth Rinse q12n4p  . potassium chloride  40 mEq Oral BID  . sodium chloride flush  10-40 mL Intracatheter Q12H  . sodium chloride flush  5 mL Intracatheter Q8H    Objective: Vital signs in last 24 hours: Temp:  [98 F (36.7 C)-98.4 F (36.9 C)] 98 F (36.7 C) (01/02 0535) Pulse Rate:  [89-92] 92 (01/02 0535) Resp:  [16-20] 19 (01/02 0535) BP: (115-147)/(54-75) 115/54 (01/02 0535) SpO2:  [94 %-96 %] 94 % (01/02 0535) Weight:  [81.7 kg] 81.7 kg (01/02 0500)   General appearance: alert, cooperative and no  distress Resp: clear to auscultation bilaterally Cardio: regular rate and rhythm GI: normal findings: bowel sounds normal and soft, non-tender and abnormal findings:  distended Extremities: edema trace  Lab Results Recent Labs    08/09/20 0528 08/10/20 0522  WBC 10.6* 10.0  HGB 9.0* 9.0*  HCT 28.0* 28.3*  NA 137 139  K 3.6 3.0*  CL 100 103  CO2 28 27  BUN 24* 20  CREATININE 1.03* 0.91   Liver Panel Recent Labs    08/09/20 0528 08/10/20 0522  PROT 5.1* 5.2*  ALBUMIN 2.0* 2.2*  AST 22 22  ALT 8 11  ALKPHOS 165* 154*  BILITOT 0.7 0.5   Sedimentation Rate No results for input(s): ESRSEDRATE in the last 72 hours. C-Reactive Protein No results for input(s): CRP in the last 72 hours.  Microbiology: Recent Results (from the past 240 hour(s))  Culture, blood (single)     Status: Abnormal    Collection Time: 07/31/20  5:26 PM   Specimen: BLOOD  Result Value Ref Range Status   Specimen Description   Final    BLOOD LEFT ANTECUBITAL Performed at [facility], [address] [place], [place] [address]    Special Requests   Final    BOTTLES DRAWN AEROBIC AND ANAEROBIC Blood Culture adequate volume Performed at [facility], [address], [place], [place] [address]    Culture  Setup Time   Final    GRAM NEGATIVE RODS AEROBIC AND ANEROBIC BOTTLES Gram Stain Report Called to,Read Back By and Verified With: LARIMORE, A. 12/24 @ 0740 BEARD, S.  CRITICAL RESULT CALLED TO, READ BACK BY AND VERIFIED WITH: PHARMD  Mitzie Na [id number] 1921 FCP Performed at [facility], [place] [address], [place], [place] [address]    Culture ESCHERICHIA COLI (A)  Final   Report Status 08/03/2020 FINAL  Final   Organism ID, Bacteria ESCHERICHIA COLI  Final      Susceptibility   Escherichia coli - MIC*    AMPICILLIN <=2 SENSITIVE Sensitive     CEFAZOLIN <=4 SENSITIVE Sensitive     CEFEPIME <=0.12 SENSITIVE Sensitive     CEFTAZIDIME <=1 SENSITIVE Sensitive     CEFTRIAXONE <=0.25 SENSITIVE Sensitive     CIPROFLOXACIN <=0.25 SENSITIVE Sensitive     GENTAMICIN <=1 SENSITIVE Sensitive     IMIPENEM <=0.25 SENSITIVE Sensitive     TRIMETH/SULFA <=20 SENSITIVE Sensitive     AMPICILLIN/SULBACTAM <=2 SENSITIVE Sensitive     PIP/TAZO <=4 SENSITIVE Sensitive     * ESCHERICHIA COLI  Blood Culture ID Panel (Reflexed)     Status: Abnormal   Collection Time: 07/31/20  5:26 PM  Result Value Ref Range Status   Enterococcus faecalis NOT DETECTED NOT DETECTED Final   Enterococcus Faecium NOT DETECTED NOT DETECTED Final   Listeria monocytogenes NOT DETECTED NOT DETECTED Final   Staphylococcus species NOT DETECTED NOT DETECTED Final   Staphylococcus aureus (BCID) NOT DETECTED NOT DETECTED Final   Staphylococcus epidermidis NOT DETECTED NOT DETECTED Final   Staphylococcus lugdunensis NOT DETECTED NOT DETECTED Final    Streptococcus species NOT DETECTED NOT DETECTED Final   Streptococcus agalactiae NOT DETECTED NOT DETECTED Final   Streptococcus pneumoniae NOT DETECTED NOT DETECTED Final   Streptococcus pyogenes NOT DETECTED NOT DETECTED Final   A.calcoaceticus-baumannii NOT DETECTED NOT DETECTED Final   Bacteroides fragilis NOT DETECTED NOT DETECTED Final   Enterobacterales DETECTED (A) NOT DETECTED Final    Comment: Enterobacterales represent a large order of gram negative bacteria, not a single  organism. CRITICAL RESULT CALLED TO, READ BACK BY AND VERIFIED WITH: PHARMD  TERRY G. [id number] 1921 FCP    Enterobacter cloacae complex NOT DETECTED NOT DETECTED Final   Escherichia coli DETECTED (A) NOT DETECTED Final    Comment: CRITICAL RESULT CALLED TO, READ BACK BY AND VERIFIED WITH: PHARMD  TERRY G. [id number] 1921 FCP    Klebsiella aerogenes NOT DETECTED NOT DETECTED Final   Klebsiella oxytoca NOT DETECTED NOT DETECTED Final   Klebsiella pneumoniae NOT DETECTED NOT DETECTED Final   Proteus species NOT DETECTED NOT DETECTED Final   Salmonella species NOT DETECTED NOT DETECTED Final   Serratia marcescens NOT DETECTED NOT DETECTED Final   Haemophilus influenzae NOT DETECTED NOT DETECTED Final   Neisseria meningitidis NOT DETECTED NOT DETECTED Final   Pseudomonas aeruginosa NOT DETECTED NOT DETECTED Final   Stenotrophomonas maltophilia NOT DETECTED NOT DETECTED Final   Candida albicans NOT DETECTED NOT DETECTED Final   Candida auris NOT DETECTED NOT DETECTED Final   Candida glabrata NOT DETECTED NOT DETECTED Final   Candida krusei NOT DETECTED NOT DETECTED Final   Candida parapsilosis NOT DETECTED NOT DETECTED Final   Candida tropicalis NOT DETECTED NOT DETECTED Final   Cryptococcus neoformans/gattii NOT DETECTED NOT DETECTED Final   CTX-M ESBL NOT DETECTED NOT DETECTED Final   Carbapenem resistance IMP NOT DETECTED NOT DETECTED Final   Carbapenem resistance KPC NOT DETECTED NOT DETECTED Final    Carbapenem resistance NDM NOT DETECTED NOT DETECTED Final   Carbapenem resist OXA 48 LIKE NOT DETECTED NOT DETECTED Final   Carbapenem resistance VIM NOT DETECTED NOT DETECTED Final    Comment: Performed at [facility], [address], [place], [place] [address]  Resp Panel by RT-PCR (Flu A&B, Covid) Nasopharyngeal Swab     Status: None   Collection Time: 07/31/20  5:39 PM   Specimen: Nasopharyngeal Swab; Nasopharyngeal(NP) swabs in vial transport medium  Result Value Ref Range Status   SARS Coronavirus 2 by RT PCR NEGATIVE NEGATIVE Final    Comment: (NOTE) SARS-CoV-2 target nucleic acids are NOT DETECTED.  The SARS-CoV-2 RNA is generally detectable in upper respiratory specimens during the acute phase of infection. The lowest concentration of SARS-CoV-2 viral copies this assay can detect is 138 copies/mL. A negative result does not preclude SARS-Cov-2 infection and should not be used as the sole basis for treatment or other patient management decisions. A negative result may occur with  improper specimen collection/handling, submission of specimen other than nasopharyngeal swab, presence of viral mutation(s) within the areas targeted by this assay, and inadequate number of viral copies(<138 copies/mL). A negative result must be combined with clinical observations, patient history, and epidemiological information. The expected result is Negative.  Fact Sheet for Patients:  BloggerCourse.com  Fact Sheet for Healthcare Providers:  SeriousBroker.it  This test is no t yet approved or cleared by the [place] FDA and  has been authorized for detection and/or diagnosis of SARS-CoV-2 by FDA under an Emergency Use Authorization (EUA). This EUA will remain  in effect (meaning this test can be used) for the duration of the COVID-19 declaration under Section 564(b)(1) of the Act, 21 U.S.C.section 360bbb-3(b)(1), unless the  authorization is terminated  or revoked sooner.       Influenza A by PCR NEGATIVE NEGATIVE Final   Influenza B by PCR NEGATIVE NEGATIVE Final    Comment: (NOTE) The Xpert Xpress SARS-CoV-2/FLU/RSV plus assay is intended as an aid in the diagnosis of influenza from Nasopharyngeal swab specimens  and should not be used as a sole basis for treatment. Nasal washings and aspirates are unacceptable for Xpert Xpress SARS-CoV-2/FLU/RSV testing.  Fact Sheet for Patients: BloggerCourse.com  Fact Sheet for Healthcare Providers: SeriousBroker.it  This test is not yet approved or cleared by the [place] FDA and has been authorized for detection and/or diagnosis of SARS-CoV-2 by FDA under an Emergency Use Authorization (EUA). This EUA will remain in effect (meaning this test can be used) for the duration of the COVID-19 declaration under Section 564(b)(1) of the Act, 21 U.S.C. section 360bbb-3(b)(1), unless the authorization is terminated or revoked.  Performed at [facility], [address], [place], [place] [address]   Culture, blood (single)     Status: None   Collection Time: 07/31/20  8:23 PM   Specimen: BLOOD RIGHT WRIST  Result Value Ref Range Status   Specimen Description BLOOD RIGHT WRIST  Final   Special Requests   Final    BOTTLES DRAWN AEROBIC AND ANAEROBIC Blood Culture results may not be optimal due to an excessive volume of blood received in culture bottles   Culture   Final    NO GROWTH 5 DAYS Performed at [facility], [address] [place], [place] [address]    Report Status 08/05/2020 FINAL  Final  Urine Culture     Status: Abnormal   Collection Time: 08/01/20 11:28 AM   Specimen: Urine, Clean Catch  Result Value Ref Range Status   Specimen Description   Final    URINE, CLEAN CATCH Performed at [facility], [address], [place], [place] [address]    Special Requests   Final    NONE Performed at [facility], [address] [place], [place] [address]    Culture (A)  Final    <10,000 COLONIES/mL INSIGNIFICANT GROWTH Performed at [facility], [address], [place], [place] [address]    Report Status 08/02/2020 FINAL  Final  MRSA PCR Screening     Status: None   Collection Time: 08/01/20  3:26 PM   Specimen: Nasopharyngeal  Result Value Ref Range Status   MRSA by PCR NEGATIVE NEGATIVE Final    Comment:        The GeneXpert MRSA Assay (FDA approved for NASAL specimens only), is one component of a comprehensive MRSA colonization surveillance program. It is not intended to diagnose MRSA infection nor to guide or monitor treatment for MRSA infections. Performed at [facility], [address], [place], [place] [address]   Aerobic/Anaerobic Culture (surgical/deep wound)     Status: None   Collection Time: 08/01/20  7:37 PM   Specimen: Urine, Catheterized  Result Value Ref Range Status   Specimen Description   Final    URINE, CATHETERIZED URINE FROM DRAIN Performed at [facility], [address], [place], [place] [address]    Special Requests   Final    NONE Performed at [facility], [address] [place], [place] [address]    Gram Stain   Final    ABUNDANT WBC PRESENT, PREDOMINANTLY PMN RARE GRAM NEGATIVE RODS RARE GRAM POSITIVE RODS    Culture   Final    FEW ESCHERICHIA COLI NO ANAEROBES ISOLATED Performed at [facility], [address] [place], [place] [address]    Report Status 08/07/2020 FINAL  Final   Organism ID, Bacteria ESCHERICHIA COLI  Final      Susceptibility   Escherichia coli - MIC*  AMPICILLIN 4 SENSITIVE Sensitive     CEFAZOLIN <=4 SENSITIVE Sensitive     CEFEPIME <=0.12 SENSITIVE Sensitive     CEFTRIAXONE <=0.25 SENSITIVE Sensitive     CIPROFLOXACIN <=0.25 SENSITIVE Sensitive     GENTAMICIN <=1 SENSITIVE Sensitive     IMIPENEM <=0.25 SENSITIVE Sensitive      NITROFURANTOIN <=16 SENSITIVE Sensitive     TRIMETH/SULFA <=20 SENSITIVE Sensitive     AMPICILLIN/SULBACTAM <=2 SENSITIVE Sensitive     PIP/TAZO <=4 SENSITIVE Sensitive     * FEW ESCHERICHIA COLI  Aerobic/Anaerobic Culture (surgical/deep wound)     Status: None   Collection Time: 08/01/20  7:38 PM   Specimen: Abscess  Result Value Ref Range Status   Specimen Description   Final    ABSCESS Performed at [facility], [address], [place], [place] [address]    Special Requests   Final    NONE Performed at [facility], [address], [place], [place] [address]    Gram Stain   Final    ABUNDANT WBC PRESENT, PREDOMINANTLY PMN MODERATE GRAM NEGATIVE RODS    Culture   Final    ABUNDANT ESCHERICHIA COLI NO ANAEROBES ISOLATED Performed at [facility], [address], [place], [place] [address]    Report Status 08/07/2020 FINAL  Final   Organism ID, Bacteria ESCHERICHIA COLI  Final      Susceptibility   Escherichia coli - MIC*    AMPICILLIN <=2 SENSITIVE Sensitive     CEFAZOLIN <=4 SENSITIVE Sensitive     CEFEPIME <=0.12 SENSITIVE Sensitive     CEFTAZIDIME <=1 SENSITIVE Sensitive     CEFTRIAXONE <=0.25 SENSITIVE Sensitive     CIPROFLOXACIN <=0.25 SENSITIVE Sensitive     GENTAMICIN <=1 SENSITIVE Sensitive     IMIPENEM <=0.25 SENSITIVE Sensitive     TRIMETH/SULFA <=20 SENSITIVE Sensitive     AMPICILLIN/SULBACTAM <=2 SENSITIVE Sensitive     PIP/TAZO <=4 SENSITIVE Sensitive     * ABUNDANT ESCHERICHIA COLI    Studies/Results: CT CHEST ABDOMEN PELVIS W CONTRAST  Result Date: 08/08/2020 CLINICAL DATA:  60 year old female with history of complicated pyelonephritis. EXAM: CT CHEST, ABDOMEN, AND PELVIS WITH CONTRAST TECHNIQUE: Multidetector CT imaging of the chest, abdomen and pelvis was performed following the standard protocol during bolus administration of intravenous contrast. CONTRAST:  OMNIPAQUE IOHEXOL 300 MG/ML  SOLN  COMPARISON:  CT the chest, abdomen and pelvis 08/01/2020. FINDINGS: CT CHEST FINDINGS Cardiovascular: Heart size is normal. There is no significant pericardial fluid, thickening or pericardial calcification. There is aortic atherosclerosis, as well as atherosclerosis of the great vessels of the mediastinum and the coronary arteries, including calcified atherosclerotic plaque in the left anterior descending coronary artery. Dilatation of the pulmonic trunk (3.6 cm in diameter). Mediastinum/Nodes: No pathologically enlarged in mediastinal or hilar lymph nodes. Esophagus is unremarkable in appearance. No axillary lymphadenopathy. Lungs/Pleura: Numerous pulmonary nodules and masses are again noted throughout the lungs bilaterally, many of which have central areas of cavitation. The largest of these is in the posterior aspect of the right upper lobe (axial image 54 of series 5) measuring 3.2 x 3.2 cm, with central low attenuation regions and internal cavitation, compatible with a large intrapulmonary abscess. Overall, findings are strongly suggestive of widespread septic emboli. Moderate bilateral pleural effusions lying dependently with extensive passive atelectasis in the lower lobes of the lungs bilaterally. Musculoskeletal: There are no aggressive appearing lytic or blastic lesions noted in the visualized portions of the skeleton. CT  ABDOMEN PELVIS FINDINGS Hepatobiliary: No suspicious cystic or solid hepatic lesions. No intra or extrahepatic biliary ductal dilatation. Gallbladder is normal in appearance. Pancreas: No pancreatic mass. No pancreatic ductal dilatation. No peripancreatic fluid collections or inflammatory changes. Spleen: Unremarkable. Adrenals/Urinary Tract: New nephrostomy tube extends into the left renal pelvis and appears appropriately located. The left kidneys enlarged and very heterogeneous in appearance with multiple hypovascular hypoenhancing regions, compatible with residual areas of  pyelonephritis and associated renal and perinephric abscesses. These extend into the perinephric space where there is a combination of gas and fluid. Some of this tracks caudally where there is a larger collection of predominantly fluid, trace amount of gas, and some higher attenuation material adjacent to the tip of an indwelling drain (best appreciated on axial image 89 of series 2 and coronal image 74 of series 6) which measures approximately 4.3 x 2.1 x 3.6 cm. This perinephric fluid collection trucks caudally into the left hemipelvis along the left pelvic sidewall, coming into contact with the left iliacus musculature. Overall, the extent of fluid in this collection appears increased. This is well demonstrated in a relatively well-defined portion of the collection laterally which currently measures 9.5 x 3.6 cm (axial image 78 of series 2) and previously measured only 8.2 x 2.4 cm when measured in a similar fashion on 08/01/2020. Right kidney and bilateral adrenal glands are normal in appearance. No right hydroureteronephrosis. Urinary bladder is completely decompressed around an indwelling Foley balloon catheter. Small amount of gas present in the lumen of the urinary bladder is presumably iatrogenic. Stomach/Bowel: The appearance of the stomach is normal. There is no pathologic dilatation of small bowel or colon. Normal appendix. Vascular/Lymphatic: Aortic atherosclerosis, without evidence of aneurysm or dissection in the abdominal or pelvic vasculature. Filling defect in the left renal vein best appreciated on coronal image 77 of series 6, compatible with nonocclusive left renal vein thrombus. No lymphadenopathy noted in the abdomen or pelvis. Reproductive: Uterus and ovaries are unremarkable in appearance. Other: Diffuse body wall edema. Trace volume of ascites. No pneumoperitoneum. Musculoskeletal: There are no aggressive appearing lytic or blastic lesions noted in the visualized portions of the skeleton.  IMPRESSION: 1. Compared to the prior study there is been interval nephrostomy tube placement which appears appropriately located, as well as percutaneous drainage catheter placement in the inferior aspect of the patient's extensive perinephric abscess. Unfortunately, the overall size of the abscess appears increased compared to the prior examination, and there is new high attenuation fluid within the inferior aspect of this collection, which could represent some recent hemorrhage. If there is concern for active hemorrhage, further evaluation with triple-phase CT of the abdomen and pelvis should be considered to evaluate for source of active bleeding. 2. Numerous large pulmonary nodules and masses in the lungs bilaterally, with imaging characteristics most compatible with extensive septic emboli with multifocal pulmonary abscess formation. 3. 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 This likely represents the source of emboli in this patient with evidence of septic embolization. 4. Moderate bilateral pleural effusions lying dependently with associated areas of passive atelectasis in the lower lobes of the lungs bilaterally. 5. Dilatation of the pulmonic trunk (3.6 cm in diameter), concerning for pulmonary arterial hypertension. 6. Additional incidental findings, as above. These results were called by telephone at the time of interpretation on 08/08/2020 at 5:38 pm to provider [facility], who verbally acknowledged these results. Electronically Signed   By: Brayton Marsaniel  Entrikin M.D.  On: 08/08/2020 17:38   US EKG SITE RITE  Result Date: 08/09/2020 If Site Rite image not attached, placement could not be confirmed due to current cardiac rhythm.    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  Afebrile. WBC normal.  Po K+ replacement FSG still some variability PIC placed. Appreciate outstanding nursing care that she is receiving.     Johny SaxJeffrey Laney Louderback MD, FACP Infectious Diseases (pager) [phone] [email or web address] 08/10/2020, 10:56 AM  LOS: 10 days

## 2020-08-10 NOTE — Progress Notes (Signed)
Subjective: Patient reports continued improvement, minimal pain.Afebrile, WBC 10K.  Tolerating oral diet satisfactorily.  Objective: Vital signs in last 24 hours: Temp:  [98 F (36.7 C)-98.4 F (36.9 C)] 98 F (36.7 C) (01/02 0535) Pulse Rate:  [89-92] 92 (01/02 0535) Resp:  [16-20] 19 (01/02 0535) BP: (115-147)/(54-75) 115/54 (01/02 0535) SpO2:  [94 %-96 %] 94 % (01/02 0535) Weight:  [81.7 kg] 81.7 kg (01/02 0500)  Intake/Output from previous day: 01/01 0701 - 01/02 0700 In: 23  Out: 9381 [Urine:3375; Drains:300] Intake/Output this shift: No intake/output data recorded.  Physical Exam:  General:alert, cooperative and no distress GI: abd soft Female genitalia: not done   Lab Results: Recent Labs    08/08/20 0823 08/09/20 0528 08/10/20 0522  HGB 9.9* 9.0* 9.0*  HCT 30.8* 28.0* 28.3*   BMET Recent Labs    08/09/20 0528 08/10/20 0522  NA 137 139  K 3.6 3.0*  CL 100 103  CO2 28 27  GLUCOSE 126* 132*  BUN 24* 20  CREATININE 1.03* 0.91  CALCIUM 8.0* 8.0*   No results for input(s): LABPT, INR in the last 72 hours. No results for input(s): LABURIN in the last 72 hours. Results for orders placed or performed during the hospital encounter of 07/31/20  Culture, blood (single)     Status: Abnormal   Collection Time: 07/31/20  5:26 PM   Specimen: BLOOD  Result Value Ref Range Status   Specimen Description   Final    BLOOD LEFT ANTECUBITAL Performed at [facility], [address] [place], [place] [address]    Special Requests   Final    BOTTLES DRAWN AEROBIC AND ANAEROBIC Blood Culture adequate volume Performed at [facility], [address] [place], [place] [address]    Culture  Setup Time   Final    GRAM NEGATIVE RODS AEROBIC AND ANEROBIC BOTTLES Gram Stain Report Called to,Read Back By and Verified With: LARIMORE, A. 12/24 @ 0740 BEARD, S.  CRITICAL RESULT CALLED TO, READ BACK BY AND VERIFIED WITH: PHARMD  Mitzie Na [id number] 1921 FCP Performed at [facility], [place] [address], [place], [place] [address]    Culture ESCHERICHIA COLI (A)  Final   Report Status 08/03/2020 FINAL  Final   Organism ID, Bacteria ESCHERICHIA COLI  Final      Susceptibility   Escherichia coli - MIC*    AMPICILLIN <=2 SENSITIVE Sensitive     CEFAZOLIN <=4 SENSITIVE Sensitive     CEFEPIME <=0.12 SENSITIVE Sensitive     CEFTAZIDIME <=1 SENSITIVE Sensitive     CEFTRIAXONE <=0.25 SENSITIVE Sensitive     CIPROFLOXACIN <=0.25 SENSITIVE Sensitive     GENTAMICIN <=1 SENSITIVE Sensitive     IMIPENEM <=0.25 SENSITIVE Sensitive     TRIMETH/SULFA <=20 SENSITIVE Sensitive     AMPICILLIN/SULBACTAM <=2 SENSITIVE Sensitive     PIP/TAZO <=4 SENSITIVE Sensitive     * ESCHERICHIA COLI  Blood Culture ID Panel (Reflexed)     Status: Abnormal   Collection Time: 07/31/20  5:26 PM  Result Value Ref Range Status   Enterococcus faecalis NOT DETECTED NOT DETECTED Final   Enterococcus Faecium NOT DETECTED NOT DETECTED Final   Listeria monocytogenes NOT DETECTED NOT DETECTED Final   Staphylococcus species NOT DETECTED NOT DETECTED Final   Staphylococcus aureus (BCID) NOT DETECTED NOT DETECTED Final   Staphylococcus epidermidis NOT DETECTED NOT DETECTED Final   Staphylococcus lugdunensis NOT DETECTED NOT DETECTED Final   Streptococcus species NOT DETECTED NOT DETECTED Final  Streptococcus agalactiae NOT DETECTED NOT DETECTED Final   Streptococcus pneumoniae NOT DETECTED NOT DETECTED Final   Streptococcus pyogenes NOT DETECTED NOT DETECTED Final   A.calcoaceticus-baumannii NOT DETECTED NOT DETECTED Final   Bacteroides fragilis NOT DETECTED NOT DETECTED Final   Enterobacterales DETECTED (A) NOT DETECTED Final    Comment: Enterobacterales represent a large order of gram negative bacteria, not a single organism. CRITICAL RESULT CALLED TO, READ BACK BY AND VERIFIED WITH: PHARMD  TERRY G. [id number] 1921 FCP    Enterobacter cloacae complex NOT DETECTED NOT DETECTED Final   Escherichia  coli DETECTED (A) NOT DETECTED Final    Comment: CRITICAL RESULT CALLED TO, READ BACK BY AND VERIFIED WITH: PHARMD  TERRY G. [id number] 1921 FCP    Klebsiella aerogenes NOT DETECTED NOT DETECTED Final   Klebsiella oxytoca NOT DETECTED NOT DETECTED Final   Klebsiella pneumoniae NOT DETECTED NOT DETECTED Final   Proteus species NOT DETECTED NOT DETECTED Final   Salmonella species NOT DETECTED NOT DETECTED Final   Serratia marcescens NOT DETECTED NOT DETECTED Final   Haemophilus influenzae NOT DETECTED NOT DETECTED Final   Neisseria meningitidis NOT DETECTED NOT DETECTED Final   Pseudomonas aeruginosa NOT DETECTED NOT DETECTED Final   Stenotrophomonas maltophilia NOT DETECTED NOT DETECTED Final   Candida albicans NOT DETECTED NOT DETECTED Final   Candida auris NOT DETECTED NOT DETECTED Final   Candida glabrata NOT DETECTED NOT DETECTED Final   Candida krusei NOT DETECTED NOT DETECTED Final   Candida parapsilosis NOT DETECTED NOT DETECTED Final   Candida tropicalis NOT DETECTED NOT DETECTED Final   Cryptococcus neoformans/gattii NOT DETECTED NOT DETECTED Final   CTX-M ESBL NOT DETECTED NOT DETECTED Final   Carbapenem resistance IMP NOT DETECTED NOT DETECTED Final   Carbapenem resistance KPC NOT DETECTED NOT DETECTED Final   Carbapenem resistance NDM NOT DETECTED NOT DETECTED Final   Carbapenem resist OXA 48 LIKE NOT DETECTED NOT DETECTED Final   Carbapenem resistance VIM NOT DETECTED NOT DETECTED Final    Comment: Performed at [facility], [address] [place], [place] [address]  Resp Panel by RT-PCR (Flu A&B, Covid) Nasopharyngeal Swab     Status: None   Collection Time: 07/31/20  5:39 PM   Specimen: Nasopharyngeal Swab; Nasopharyngeal(NP) swabs in vial transport medium  Result Value Ref Range Status   SARS Coronavirus 2 by RT PCR NEGATIVE NEGATIVE Final    Comment: (NOTE) SARS-CoV-2 target nucleic acids are NOT DETECTED.  The SARS-CoV-2 RNA is generally detectable in upper  respiratory specimens during the acute phase of infection. The lowest concentration of SARS-CoV-2 viral copies this assay can detect is 138 copies/mL. A negative result does not preclude SARS-Cov-2 infection and should not be used as the sole basis for treatment or other patient management decisions. A negative result may occur with  improper specimen collection/handling, submission of specimen other than nasopharyngeal swab, presence of viral mutation(s) within the areas targeted by this assay, and inadequate number of viral copies(<138 copies/mL). A negative result must be combined with clinical observations, patient history, and epidemiological information. The expected result is Negative.  Fact Sheet for Patients:  BloggerCourse.com  Fact Sheet for Healthcare Providers:  SeriousBroker.it  This test is no t yet approved or cleared by the [place] FDA and  has been authorized for detection and/or diagnosis of SARS-CoV-2 by FDA under an Emergency Use Authorization (EUA). This EUA will remain  in effect (meaning this test can be used) for the duration of the COVID-19  declaration under Section 564(b)(1) of the Act, 21 U.S.C.section 360bbb-3(b)(1), unless the authorization is terminated  or revoked sooner.       Influenza A by PCR NEGATIVE NEGATIVE Final   Influenza B by PCR NEGATIVE NEGATIVE Final    Comment: (NOTE) The Xpert Xpress SARS-CoV-2/FLU/RSV plus assay is intended as an aid in the diagnosis of influenza from Nasopharyngeal swab specimens and should not be used as a sole basis for treatment. Nasal washings and aspirates are unacceptable for Xpert Xpress SARS-CoV-2/FLU/RSV testing.  Fact Sheet for Patients: BloggerCourse.com  Fact Sheet for Healthcare Providers: SeriousBroker.it  This test is not yet approved or cleared by the [place] FDA and has been  authorized for detection and/or diagnosis of SARS-CoV-2 by FDA under an Emergency Use Authorization (EUA). This EUA will remain in effect (meaning this test can be used) for the duration of the COVID-19 declaration under Section 564(b)(1) of the Act, 21 U.S.C. section 360bbb-3(b)(1), unless the authorization is terminated or revoked.  Performed at [facility], [address], [place], [place] [address]   Culture, blood (single)     Status: None   Collection Time: 07/31/20  8:23 PM   Specimen: BLOOD RIGHT WRIST  Result Value Ref Range Status   Specimen Description BLOOD RIGHT WRIST  Final   Special Requests   Final    BOTTLES DRAWN AEROBIC AND ANAEROBIC Blood Culture results may not be optimal due to an excessive volume of blood received in culture bottles   Culture   Final    NO GROWTH 5 DAYS Performed at [facility], [address], [place], [place] [address]    Report Status 08/05/2020 FINAL  Final  Urine Culture     Status: Abnormal   Collection Time: 08/01/20 11:28 AM   Specimen: Urine, Clean Catch  Result Value Ref Range Status   Specimen Description   Final    URINE, CLEAN CATCH Performed at [facility], [address] [place], [place] [address]    Special Requests   Final    NONE Performed at [facility], [address], [place], [place] [address]    Culture (A)  Final    <10,000 COLONIES/mL INSIGNIFICANT GROWTH Performed at [facility], [address] [place], [place] [address]    Report Status 08/02/2020 FINAL  Final  MRSA PCR Screening     Status: None   Collection Time: 08/01/20  3:26 PM   Specimen: Nasopharyngeal  Result Value Ref Range Status   MRSA by PCR NEGATIVE NEGATIVE Final    Comment:        The GeneXpert MRSA Assay (FDA approved for NASAL specimens only), is one component of a comprehensive MRSA colonization surveillance program. It is not intended to diagnose MRSA infection nor to guide or monitor treatment for MRSA  infections. Performed at [facility], [address], [place], [place] [address]   Aerobic/Anaerobic Culture (surgical/deep wound)     Status: None   Collection Time: 08/01/20  7:37 PM   Specimen: Urine, Catheterized  Result Value Ref Range Status   Specimen Description   Final    URINE, CATHETERIZED URINE FROM DRAIN Performed at [facility], [address] [place], [place] [address]    Special Requests   Final    NONE Performed at [facility], [address], [place], [place] [address]    Gram Stain   Final    ABUNDANT WBC PRESENT, PREDOMINANTLY PMN RARE GRAM NEGATIVE  RODS RARE GRAM POSITIVE RODS    Culture   Final    FEW ESCHERICHIA COLI NO ANAEROBES ISOLATED Performed at [facility], [address] [place], [place] [address]    Report Status 08/07/2020 FINAL  Final   Organism ID, Bacteria ESCHERICHIA COLI  Final      Susceptibility   Escherichia coli - MIC*    AMPICILLIN 4 SENSITIVE Sensitive     CEFAZOLIN <=4 SENSITIVE Sensitive     CEFEPIME <=0.12 SENSITIVE Sensitive     CEFTRIAXONE <=0.25 SENSITIVE Sensitive     CIPROFLOXACIN <=0.25 SENSITIVE Sensitive     GENTAMICIN <=1 SENSITIVE Sensitive     IMIPENEM <=0.25 SENSITIVE Sensitive     NITROFURANTOIN <=16 SENSITIVE Sensitive     TRIMETH/SULFA <=20 SENSITIVE Sensitive     AMPICILLIN/SULBACTAM <=2 SENSITIVE Sensitive     PIP/TAZO <=4 SENSITIVE Sensitive     * FEW ESCHERICHIA COLI  Aerobic/Anaerobic Culture (surgical/deep wound)     Status: None   Collection Time: 08/01/20  7:38 PM   Specimen: Abscess  Result Value Ref Range Status   Specimen Description   Final    ABSCESS Performed at [facility], [address] [place], [place] [address]    Special Requests   Final    NONE Performed at [facility], [address] [place], [place] [address]    Gram Stain   Final    ABUNDANT WBC PRESENT, PREDOMINANTLY  PMN MODERATE GRAM NEGATIVE RODS    Culture   Final    ABUNDANT ESCHERICHIA COLI NO ANAEROBES ISOLATED Performed at [facility], [address] [place], [place] [address]    Report Status 08/07/2020 FINAL  Final   Organism ID, Bacteria ESCHERICHIA COLI  Final      Susceptibility   Escherichia coli - MIC*    AMPICILLIN <=2 SENSITIVE Sensitive     CEFAZOLIN <=4 SENSITIVE Sensitive     CEFEPIME <=0.12 SENSITIVE Sensitive     CEFTAZIDIME <=1 SENSITIVE Sensitive     CEFTRIAXONE <=0.25 SENSITIVE Sensitive     CIPROFLOXACIN <=0.25 SENSITIVE Sensitive     GENTAMICIN <=1 SENSITIVE Sensitive     IMIPENEM <=0.25 SENSITIVE Sensitive     TRIMETH/SULFA <=20 SENSITIVE Sensitive     AMPICILLIN/SULBACTAM <=2 SENSITIVE Sensitive     PIP/TAZO <=4 SENSITIVE Sensitive     * ABUNDANT ESCHERICHIA COLI    Studies/Results: CT CHEST ABDOMEN PELVIS W CONTRAST  Result Date: 08/08/2020 CLINICAL DATA:  60 year old female with history of complicated pyelonephritis. EXAM: CT CHEST, ABDOMEN, AND PELVIS WITH CONTRAST TECHNIQUE: Multidetector CT imaging of the chest, abdomen and pelvis was performed following the standard protocol during bolus administration of intravenous contrast. CONTRAST:  100mL OMNIPAQUE IOHEXOL 300 MG/ML  SOLN COMPARISON:  CT the chest, abdomen and pelvis 08/01/2020. FINDINGS: CT CHEST FINDINGS Cardiovascular: Heart size is normal. There is no significant pericardial fluid, thickening or pericardial calcification. There is aortic atherosclerosis, as well as atherosclerosis of the great vessels of the mediastinum and the coronary arteries, including calcified atherosclerotic plaque in the left anterior descending coronary artery. Dilatation of the pulmonic trunk (3.6 cm in diameter). Mediastinum/Nodes: No pathologically enlarged in mediastinal or hilar lymph nodes. Esophagus is unremarkable in appearance. No axillary lymphadenopathy. Lungs/Pleura: Numerous pulmonary nodules and masses are  again noted throughout the lungs bilaterally, many of which have central areas of cavitation. The largest of these is in the posterior aspect of the right upper lobe (axial image 54 of  series 5) measuring 3.2 x 3.2 cm, with central low attenuation regions and internal cavitation, compatible with a large intrapulmonary abscess. Overall, findings are strongly suggestive of widespread septic emboli. Moderate bilateral pleural effusions lying dependently with extensive passive atelectasis in the lower lobes of the lungs bilaterally. Musculoskeletal: There are no aggressive appearing lytic or blastic lesions noted in the visualized portions of the skeleton. CT ABDOMEN PELVIS FINDINGS Hepatobiliary: No suspicious cystic or solid hepatic lesions. No intra or extrahepatic biliary ductal dilatation. Gallbladder is normal in appearance. Pancreas: No pancreatic mass. No pancreatic ductal dilatation. No peripancreatic fluid collections or inflammatory changes. Spleen: Unremarkable. Adrenals/Urinary Tract: New nephrostomy tube extends into the left renal pelvis and appears appropriately located. The left kidneys enlarged and very heterogeneous in appearance with multiple hypovascular hypoenhancing regions, compatible with residual areas of pyelonephritis and associated renal and perinephric abscesses. These extend into the perinephric space where there is a combination of gas and fluid. Some of this tracks caudally where there is a larger collection of predominantly fluid, trace amount of gas, and some higher attenuation material adjacent to the tip of an indwelling drain (best appreciated on axial image 89 of series 2 and coronal image 74 of series 6) which measures approximately 4.3 x 2.1 x 3.6 cm. This perinephric fluid collection trucks caudally into the left hemipelvis along the left pelvic sidewall, coming into contact with the left iliacus musculature. Overall, the extent of fluid in this collection appears increased.  This is well demonstrated in a relatively well-defined portion of the collection laterally which currently measures 9.5 x 3.6 cm (axial image 78 of series 2) and previously measured only 8.2 x 2.4 cm when measured in a similar fashion on 08/01/2020. Right kidney and bilateral adrenal glands are normal in appearance. No right hydroureteronephrosis. Urinary bladder is completely decompressed around an indwelling Foley balloon catheter. Small amount of gas present in the lumen of the urinary bladder is presumably iatrogenic. Stomach/Bowel: The appearance of the stomach is normal. There is no pathologic dilatation of small bowel or colon. Normal appendix. Vascular/Lymphatic: Aortic atherosclerosis, without evidence of aneurysm or dissection in the abdominal or pelvic vasculature. Filling defect in the left renal vein best appreciated on coronal image 77 of series 6, compatible with nonocclusive left renal vein thrombus. No lymphadenopathy noted in the abdomen or pelvis. Reproductive: Uterus and ovaries are unremarkable in appearance. Other: Diffuse body wall edema. Trace volume of ascites. No pneumoperitoneum. Musculoskeletal: There are no aggressive appearing lytic or blastic lesions noted in the visualized portions of the skeleton. IMPRESSION: 1. Compared to the prior study there is been interval nephrostomy tube placement which appears appropriately located, as well as percutaneous drainage catheter placement in the inferior aspect of the patient's extensive perinephric abscess. Unfortunately, the overall size of the abscess appears increased compared to the prior examination, and there is new high attenuation fluid within the inferior aspect of this collection, which could represent some recent hemorrhage. If there is concern for active hemorrhage, further evaluation with triple-phase CT of the abdomen and pelvis should be considered to evaluate for source of active bleeding. 2. Numerous large pulmonary nodules and  masses in the lungs bilaterally, with imaging characteristics most compatible with extensive septic emboli with multifocal pulmonary abscess formation. 3. 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 This likely represents the source of emboli in this patient with evidence of septic embolization. 4. Moderate bilateral pleural effusions lying dependently with associated areas of  passive atelectasis in the lower lobes of the lungs bilaterally. 5. Dilatation of the pulmonic trunk (3.6 cm in diameter), concerning for pulmonary arterial hypertension. 6. Additional incidental findings, as above. These results were called by telephone at the time of interpretation on 08/08/2020 at 5:38 pm to provider [facility], who verbally acknowledged these results. Electronically Signed   By: Trudie Reed M.D.   On: 08/08/2020 17:38   DG Abd 2 Views  Result Date: 08/08/2020 CLINICAL DATA:  Abdominal pain and decreased oral intake. EXAM: ABDOMEN - 2 VIEW COMPARISON:  Plain film of the abdomen dated 08/07/2020. FINDINGS: Mildly distended small bowel within the central abdomen with nonspecific air-fluid levels in the upper pelvis. Air is again seen within the colon. No evidence of renal or ureteral calculi. Two percutaneous pigtail catheters are again seen within the LEFT abdomen. Dense consolidation the LEFT lung base, presumed pneumonia. IMPRESSION: 1. Mildly distended small bowel within the central abdomen with nonspecific air-fluid levels in the upper pelvis. Findings could represent ileus or partial small bowel obstruction. 2. LEFT lower lung consolidation, pneumonia versus atelectasis. Electronically Signed   By: Bary Richard M.D.   On: 08/08/2020 10:04   [place] EKG SITE RITE  Result Date: 08/09/2020 If Site Rite image not attached, placement could not be confirmed due to current cardiac rhythm.   Assessment/Plan: 1. Emphysematous  pyelonephritis - gas-forming renal / peri-renal infection by ER CT 12/24. UCX pan-sensitive e .coli. Now on ancef. Also severe DM with improving hyperglyemia. Left nephrostoy and perinephric drains placed 12/25. WBC peak 49 now trending down / stable .CT showing some increase in size of abscess but drain in good position and patient clinically improved and WBC count now 10.6.  Drain is draining well. recommend following clinically now, Recommend repeat CT Monday to reevaluate for progression of abscess. ? Need for additional perc drain if abscess enlarging or see clinical worsening.   2. UPJ calculus with mild obstructive uropathy and AKI - 47mm left prox ureteral stone by ER CT 12/24. Temporized with left neph tube. Cr peak 3's, now <1.03.      LOS: 10 days   Belva Agee 08/10/2020, 8:06 AM

## 2020-08-11 DIAGNOSIS — N12 Tubulo-interstitial nephritis, not specified as acute or chronic: Secondary | ICD-10-CM

## 2020-08-11 DIAGNOSIS — A4151 Sepsis due to Escherichia coli [E. coli]: Principal | ICD-10-CM

## 2020-08-11 DIAGNOSIS — E43 Unspecified severe protein-calorie malnutrition: Secondary | ICD-10-CM

## 2020-08-11 DIAGNOSIS — J90 Pleural effusion, not elsewhere classified: Secondary | ICD-10-CM

## 2020-08-11 DIAGNOSIS — N10 Acute tubulo-interstitial nephritis: Secondary | ICD-10-CM | POA: Diagnosis not present

## 2020-08-11 DIAGNOSIS — N151 Renal and perinephric abscess: Secondary | ICD-10-CM | POA: Diagnosis not present

## 2020-08-11 DIAGNOSIS — Z978 Presence of other specified devices: Secondary | ICD-10-CM | POA: Diagnosis not present

## 2020-08-11 DIAGNOSIS — I700 Atherosclerosis of aorta: Secondary | ICD-10-CM

## 2020-08-11 DIAGNOSIS — Z936 Other artificial openings of urinary tract status: Secondary | ICD-10-CM | POA: Diagnosis not present

## 2020-08-11 LAB — BASIC METABOLIC PANEL
Anion gap: 11 (ref 5–15)
BUN: 16 mg/dL (ref 6–20)
CO2: 25 mmol/L (ref 22–32)
Calcium: 8.1 mg/dL — ABNORMAL LOW (ref 8.9–10.3)
Chloride: 104 mmol/L (ref 98–111)
Creatinine, Ser: 0.92 mg/dL (ref 0.44–1.00)
GFR, Estimated: >60 mL/min (ref >60)
Glucose, Bld: 193 mg/dL — ABNORMAL HIGH (ref 70–99)
Potassium: 3.6 mmol/L (ref 3.5–5.1)
Sodium: 140 mmol/L (ref 135–145)

## 2020-08-11 LAB — GLUCOSE, CAPILLARY
Glucose-Capillary: 152 mg/dL — ABNORMAL HIGH (ref 70–99)
Glucose-Capillary: 168 mg/dL — ABNORMAL HIGH (ref 70–99)
Glucose-Capillary: 124 mg/dL — ABNORMAL HIGH (ref 70–99)
Glucose-Capillary: 140 mg/dL — ABNORMAL HIGH (ref 70–99)

## 2020-08-11 LAB — CBC WITH DIFFERENTIAL/PLATELET
Abs Immature Granulocytes: 0.03 10*3/uL (ref 0.00–0.07)
Basophils Absolute: 0 10*3/uL (ref 0.0–0.1)
Basophils Relative: 0 %
Eosinophils Absolute: 0.1 10*3/uL (ref 0.0–0.5)
Eosinophils Relative: 2 %
HCT: 27.4 % — ABNORMAL LOW (ref 36.0–46.0)
Hemoglobin: 8.7 g/dL — ABNORMAL LOW (ref 12.0–15.0)
Immature Granulocytes: 0 %
Lymphocytes Relative: 15 %
Lymphs Abs: 1 10*3/uL (ref 0.7–4.0)
MCH: 30.2 pg (ref 26.0–34.0)
MCHC: 31.8 g/dL (ref 30.0–36.0)
MCV: 95.1 fL (ref 80.0–100.0)
Monocytes Absolute: 0.6 10*3/uL (ref 0.1–1.0)
Monocytes Relative: 9 %
Neutro Abs: 5 10*3/uL (ref 1.7–7.7)
Neutrophils Relative %: 74 %
Platelets: 268 10*3/uL (ref 150–400)
RBC: 2.88 MIL/uL — ABNORMAL LOW (ref 3.87–5.11)
RDW: 14.9 % (ref 11.5–15.5)
WBC: 6.9 10*3/uL (ref 4.0–10.5)
nRBC: 0 % (ref 0.0–0.2)

## 2020-08-11 LAB — MAGNESIUM: Magnesium: 1.3 mg/dL — ABNORMAL LOW (ref 1.7–2.4)

## 2020-08-11 MED ORDER — SODIUM CHLORIDE 0.9 % IV SOLN
INTRAVENOUS | Status: DC | PRN
  Administered 2020-08-11 – 2020-08-12 (×3): 250 mL via INTRAVENOUS

## 2020-08-11 MED ORDER — MAGNESIUM SULFATE 2 GM/50ML IV SOLN
2.0000 g | Freq: Once | INTRAVENOUS | Status: AC
  Administered 2020-08-11: 2 g via INTRAVENOUS
  Filled 2020-08-11: qty 50

## 2020-08-11 NOTE — Progress Notes (Signed)
Pharmacy Antibiotic Note  Tammie [name] is a 60 y.o. female admitted on 07/31/2020 with E coli bacteremia.  Pharmacy has been consulted for Cefazolin dosing.  Day #11 abx - Afebrile - WBC improved to WNL - SCr improved, CrCl ~65 ml/min  Plan: Continue Ancef 2gm q8h for E coli bacteremia No dose adjustments needed, Pharmacy will sign off  Height: 5\' 2"  (157.5 cm) Weight: 80.7 kg (177 lb 14.6 oz) IBW/kg (Calculated) : 50.1  Temp (24hrs), Avg:98.2 F (36.8 C), Min:98.2 F (36.8 C), Max:98.2 F (36.8 C)  Recent Labs  Lab 08/06/20 0220 08/07/20 0506 08/08/20 0823 08/09/20 0528 08/10/20 0522  WBC 15.5* 15.2* 11.1* 10.6* 10.0  CREATININE 1.61* 1.27* 0.98 1.03* 0.91    Estimated Creatinine Clearance: 65.5 mL/min (by C-G formula based on SCr of 0.91 mg/dL).    Allergies  Allergen Reactions  . Codeine Rash   Antimicrobials this admission: Zosyn 12/24 >> 12/24 Azith/CTX 12/23 >> 12/24 CTX 12/23 >> 12/27 Ancef per ID Rx rec 12/27 >>  Dose adjustments this admission: 12/29 Ancef 2gm from q12 > q8  12/23 BCx: BCID 2/4 E coli, no resistance 12/24 UCx (clean catch): <10k insignificant growth 12/23 Strep pneumo: neg 12/24 Legionella: neg 12/24 Abscess: E coli, pan sens 12/24 MRSA PCR: neg  Thank you for allowing pharmacy to be a part of this patient's care.  1/25, PharmD, BCPS [facility] [phone] 08/11/2020 8:33 AM

## 2020-08-11 NOTE — Progress Notes (Signed)
Physical Therapy Treatment Patient Details Name: Hawraa Stambaugh MRN: [id number] DOB: March 30, 1961 Today's Date: 08/11/2020    History of Present Illness 60 year old female admitted 12/24 with chief complaint of abdominal pain.  Found to have acute left pyelonephrosis with abscess, severe sepsis, acute kidney injury, hyper osmolar nonketotic hyperglycemia, severe hyponatremia   -CT imaging showed multiple pulmonary nodules, imaging of abdomen pelvis With left hydronephrosis and evidence of perinephritic abscess.  Pt s/p placement of left nephrostomy tube and left perirenal drain both placed 12/24.    PT Comments    Progressing with mobility.    Follow Up Recommendations  Supervision for mobility/OOB     Equipment Recommendations  Rolling walker with 5" wheels (depending on continued progress)    Recommendations for Other Services       Precautions / Restrictions Precautions Precautions: Fall Precaution Comments: 2 left posterior PCN drains Restrictions Weight Bearing Restrictions: No    Mobility  Bed Mobility Overal bed mobility: Modified Independent             General bed mobility comments: No physical assistance provided. Pt relied moderately on bedrail  Transfers Overall transfer level: Needs assistance Equipment used: Rolling walker (2 wheeled) Transfers: Sit to/from Stand Sit to Stand: Supervision            Ambulation/Gait Ambulation/Gait assistance: Min guard Gait Distance (Feet): 135 Feet Assistive device: Rolling walker (2 wheeled) Gait Pattern/deviations: Step-through pattern;Decreased stride length     General Gait Details: Dyspnea 2/4. Pt tolerated increased distance fairly well. No LOB with RW use.   Stairs             Wheelchair Mobility    Modified Rankin (Stroke Patients Only)       Balance                                            Cognition Arousal/Alertness: Awake/alert Behavior During Therapy: WFL for  tasks assessed/performed Overall Cognitive Status: Within Functional Limits for tasks assessed                                        Exercises      General Comments        Pertinent Vitals/Pain Pain Assessment: Faces Faces Pain Scale: Hurts a little bit Pain Location: drain site Pain Descriptors / Indicators: Discomfort;Sore Pain Intervention(s): Monitored during session;Repositioned    Home Living                      Prior Function            PT Goals (current goals can now be found in the care plan section) Progress towards PT goals: Progressing toward goals    Frequency    Min 3X/week      PT Plan Current plan remains appropriate    Co-evaluation              AM-PAC PT "6 Clicks" Mobility   Outcome Measure  Help needed turning from your back to your side while in a flat bed without using bedrails?: None Help needed moving from lying on your back to sitting on the side of a flat bed without using bedrails?: None Help needed moving to and from a bed to a chair (including a wheelchair)?:  A Little Help needed standing up from a chair using your arms (e.g., wheelchair or bedside chair)?: A Little Help needed to walk in hospital room?: A Little Help needed climbing 3-5 steps with a railing? : A Little 6 Click Score: 20    End of Session   Activity Tolerance: Patient tolerated treatment well Patient left: in bed;with call bell/phone within reach   PT Visit Diagnosis: Difficulty in walking, not elsewhere classified (R26.2)     Time: 5364-6803 PT Time Calculation (min) (ACUTE ONLY): 10 min  Charges:  $Gait Training: 8-22 mins                         Faye Ramsay, PT Acute Rehabilitation  Office: [phone] Pager: [phone]

## 2020-08-11 NOTE — Progress Notes (Signed)
Progress Note    Tammie [name]  JOA:[id number] DOB: 06-30-1961  DOA: 07/31/2020 PCP: Patient, No Pcp Per      Brief Narrative:    Medical records reviewed and are as summarized below:  Tammie [name] is a 60 y.o. female with medical history significant for hypertension, obesity, type II DM, who was admitted 12/24 with chief complaint of abdominal pain.  She was found to have acute pyelonephritis with left renal abscess, severe sepsis, acute kidney injury, hyper osmolar nonketotic hyperglycemia (initial blood sugar in the  than 700s), severe hyponatremia. In the ED: CT imaging showed multiple pulmonary nodules, imaging of abdomen pelvis With left hydronephrosis and evidence of perinephritic abscess.  She was transferred to vascular hospital for placement of left nephrostomy tube and left pelvic abscess drain.  She was initially admitted to the ICU where she was given 3% hypertonic saline infusion for severe hyponatremia.  She was also treated with empiric IV antibiotics for severe sepsis secondary to left pyelonephritis, left renal abscess and pelvic abscess.    Assessment/Plan:   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 with type 2 diabetes mellitus (HCC)   Ileus (HCC)   Emphysematous pyelonephritis of left kidney   Nutrition Problem: Food and nutrition related knowledge deficit Etiology: limited prior education  Signs/Symptoms: per patient/family report (New DM)   Body mass index is 32.54 kg/m.   (Obesity)      Severe sepsis with E. coli bacteremia/left emphysematous pyelonephritis with perinephric abscess - Status post percutaneous nephrostomy and perirenal abscess drainage 08/01/2020.    Antibiotics have been deescalated to IV Ancef. - Urology following and repeat CT abdomen and pelvis which was done negative for ileus however did show worsening perinephric abscesses, and questionable hemorrhage, complicated emphysematous left-sided pyelonephritis with nonocclusive thrombus forming in the left renal vein likely represents source of emboli.   - As patient clinically improving urology recommending to continue current regimen of IV antibiotics, supportive care and repeat CT abdomen and pelvis today or tomorrow - CT chest concerning for numerous large pulmonary nodules or masses bilaterally compatible with extensive septic emboli with multifocal pulmonary abscess formation.   - ID following given worsening imaging.   - IR following and managing drains  AKI without known history of CKD - Underlying baseline renal function unknown.   - Likely multifactorial secondary to emphysematous pyelonephritis with abscess, ischemic ATN in the setting of volume depletion.   - Status post percutaneous nephrostomy and perirenal abscess drainage.   - Foley catheter in place.  - Repeat CT abdomen and pelvis with worsening abdominal abscesses however patient improving clinically.  Urology following.   Questionable ileus, resolving - Abdominal films obtained concerning for possible ileus.  CT abdomen and pelvis obtained negative for ileus. - Continue to advance diet as tolerated  Hypokalemia/hypomagnesemia Potassium level has normalized but magnesium level is still low at 1.3.  Replete magnesium with IV magnesium sulfate.  Newly diagnosed diabetes mellitus type 2/HHNK Hemoglobin A1c noted at 12.2.   Continue  Lantus 8 units twice daily.  Sliding scale insulin.  Diabetic coordinator following.    Severe hyponatremia, resolved Etiology is unclear.  Initially required treatment with hypertonic saline in the ICU.  Multiple pulmonary nodules Differential includes septic emboli versus metastatic malignancy. Pulmonary recommended when repeat CT is done to get a repeat chest at that same time.  Patient will need outpatient follow-up with pulmonary.  CT chest done with numerous large pulmonary nodules and masses bilaterally in the lungs with characteristics compatible with septic emboli and multiple pulmonary abscess formation, moderate bilateral pleural effusions. Per pulmonary patient will need repeat CT chest done in approximately 6 weeks.   Bibasilar crackles/moderate bilateral pleural effusion She had received IV Lasix on 08/09/2020 and 08/10/2020..             Diet Order            Diet Carb Modified Fluid consistency: Thin; Room service appropriate? Yes  Diet effective now                    Consultants:  Intensivist  Urologist  Interventional radiologist  General surgeon  Nephrologist  Procedures:  CT-guided placement of left nephrostomy tube and left pelvic abscess drain on 08/01/2020    Medications:   . Chlorhexidine Gluconate Cloth  6 each Topical Daily  . feeding supplement (GLUCERNA SHAKE)  237 mL Oral BID BM  . heparin injection (subcutaneous)  5,000 Units Subcutaneous Q8H  . insulin aspart  0-20 Units Subcutaneous TID WC  . insulin aspart  0-5 Units Subcutaneous QHS  . insulin glargine  8 Units Subcutaneous BID  . potassium chloride  40 mEq Oral BID  . sodium chloride flush  10-40 mL Intracatheter Q12H  . sodium chloride flush  5 mL Intracatheter Q8H   Continuous Infusions: . sodium chloride 250 mL (08/11/20 1246)  .  ceFAZolin (ANCEF) IV Stopped (08/11/20 1420)  . sodium chloride       Anti-infectives (From admission, onward)   Start     Dose/Rate Route Frequency Ordered Stop   08/05/20 1000  ceFAZolin (ANCEF) IVPB 2g/100 mL  premix        2 g 200 mL/hr over 30 Minutes Intravenous Every 8 hours 08/05/20 0903     08/04/20 2200  ceFAZolin (ANCEF) IVPB 2g/100 mL premix  Status:  Discontinued        2 g 200 mL/hr over 30 Minutes Intravenous Every 12 hours 08/04/20 1131 08/05/20 0903   08/02/20 0000  piperacillin-tazobactam (ZOSYN) IVPB 3.375 g  Status:  Discontinued        3.375 g 12.5 mL/hr over 240 Minutes Intravenous Every 12 hours 08/01/20 1403 08/01/20 2006   08/01/20 2100  cefTRIAXone (ROCEPHIN) 2 g in sodium chloride 0.9 % 100 mL IVPB  Status:  Discontinued        2 g 200 mL/hr over 30 Minutes Intravenous Every 24 hours 08/01/20 2006 08/04/20 1131   08/01/20 1430  piperacillin-tazobactam (ZOSYN) IVPB 3.375 g        3.375 g 100 mL/hr over 30 Minutes Intravenous  Once 08/01/20 1355 08/01/20 1502   07/31/20 2000  cefTRIAXone (ROCEPHIN) 2 g in sodium chloride 0.9 % 100 mL IVPB  Status:  Discontinued        2 g 200 mL/hr over 30 Minutes Intravenous Every 24 hours 07/31/20 1947 08/01/20 1351   07/31/20 2000  azithromycin (ZITHROMAX) 500 mg in sodium chloride 0.9 % 250 mL IVPB  Status:  Discontinued  500 mg 250 mL/hr over 60 Minutes Intravenous Every 24 hours 07/31/20 1947 08/01/20 1351             Family Communication/Anticipated D/C date and plan/Code Status   DVT prophylaxis: heparin injection 5,000 Units Start: 08/05/20 1500     Code Status: Full Code  Family Communication: None Disposition Plan:    Status is: Inpatient  Remains inpatient appropriate because:IV treatments appropriate due to intensity of illness or inability to take PO and Inpatient level of care appropriate due to severity of illness   Dispo: The patient is from: Home              Anticipated d/c is to: Home              Anticipated d/c date is: > 3 days              Patient currently is not medically stable to d/c.           Subjective:   Interval events noted.  No abdominal pain or pelvic  pain.  Objective:    Vitals:   08/10/20 2024 08/11/20 0458 08/11/20 0500 08/11/20 1214  BP: 133/79 (!) 144/78  120/62  Pulse: 89 91  93  Resp: 16 16  (!) 22  Temp: 98.2 F (36.8 C) 98.2 F (36.8 C)  98.8 F (37.1 C)  TempSrc:  Oral  Oral  SpO2: 96% 98%  94%  Weight:   80.7 kg   Height:       No data found.   Intake/Output Summary (Last 24 hours) at 08/11/2020 1522 Last data filed at 08/11/2020 1420 Gross per 24 hour  Intake 975.56 ml  Output 2050 ml  Net -1074.44 ml   Filed Weights   08/07/20 1100 08/10/20 0500 08/11/20 0500  Weight: 80.6 kg 81.7 kg 80.7 kg    Exam:  GEN: NAD SKIN: No rash EYES: EOMI ENT: MMM CV: RRR PULM: CTA B ABD: soft, obese, NT, +BS, + left pelvic drain CNS: AAO x 3, non focal EXT: No edema or tenderness GU: Left nephrostomy catheter in place   Data Reviewed:   I have personally reviewed following labs and imaging studies:  Labs: Labs show the following:   Basic Metabolic Panel: Recent Labs  Lab 08/05/20 0750 08/06/20 0220 08/07/20 0506 08/07/20 0758 08/08/20 0502 08/08/20 0823 08/09/20 0528 08/10/20 0522 08/11/20 1156  NA 133* 135 135  --   --  138 137 139 140  K 3.1* 3.8 3.2*  --   --  3.1* 3.6 3.0* 3.6  CL 89* 94* 94*  --   --  97* 100 103 104  CO2 30 29 29   --   --  30 28 27 25   GLUCOSE 146* 87 113*  --   --  80 126* 132* 193*  BUN 74* 56* 41*  --   --  32* 24* 20 16  CREATININE 1.84* 1.61* 1.27*  --   --  0.98 1.03* 0.91 0.92  CALCIUM 8.1* 8.1* 8.3*  --   --  8.1* 8.0* 8.0* 8.1*  MG 1.8 1.6*  --  1.6* 2.0  --  1.6* 2.1 1.3*  PHOS 4.1 3.4  --   --   --  2.8 2.9 2.9  --    GFR Estimated Creatinine Clearance: 64.8 mL/min (by C-G formula based on SCr of 0.92 mg/dL). Liver Function Tests: Recent Labs  Lab 08/08/20 0823 08/09/20 0528 08/10/20 0522  AST  --  22  22  ALT  --  8 11  ALKPHOS  --  165* 154*  BILITOT  --  0.7 0.5  PROT  --  5.1* 5.2*  ALBUMIN 2.0* 2.0* 2.2*   No results for input(s): LIPASE,  AMYLASE in the last 168 hours. No results for input(s): AMMONIA in the last 168 hours. Coagulation profile No results for input(s): INR, PROTIME in the last 168 hours.  CBC: Recent Labs  Lab 08/07/20 0506 08/08/20 0823 08/09/20 0528 08/10/20 0522 08/11/20 1156  WBC 15.2* 11.1* 10.6* 10.0 6.9  NEUTROABS  --  9.1* 8.9* 8.1* 5.0  HGB 10.6* 9.9* 9.0* 9.0* 8.7*  HCT 33.0* 30.8* 28.0* 28.3* 27.4*  MCV 95.1 94.5 96.2 94.6 95.1  PLT 317 327 297 296 268   Cardiac Enzymes: No results for input(s): CKTOTAL, CKMB, CKMBINDEX, TROPONINI in the last 168 hours. BNP (last 3 results) No results for input(s): PROBNP in the last 8760 hours. CBG: Recent Labs  Lab 08/10/20 1215 08/10/20 1717 08/10/20 2025 08/11/20 0740 08/11/20 1117  GLUCAP 179* 133* 121* 152* 168*   D-Dimer: No results for input(s): DDIMER in the last 72 hours. Hgb A1c: No results for input(s): HGBA1C in the last 72 hours. Lipid Profile: No results for input(s): CHOL, HDL, LDLCALC, TRIG, CHOLHDL, LDLDIRECT in the last 72 hours. Thyroid function studies: No results for input(s): TSH, T4TOTAL, T3FREE, THYROIDAB in the last 72 hours.  Invalid input(s): FREET3 Anemia work up: No results for input(s): VITAMINB12, FOLATE, FERRITIN, TIBC, IRON, RETICCTPCT in the last 72 hours. Sepsis Labs: Recent Labs  Lab 08/08/20 0823 08/09/20 0528 08/10/20 0522 08/11/20 1156  WBC 11.1* 10.6* 10.0 6.9    Microbiology Recent Results (from the past 240 hour(s))  MRSA PCR Screening     Status: None   Collection Time: 08/01/20  3:26 PM   Specimen: Nasopharyngeal  Result Value Ref Range Status   MRSA by PCR NEGATIVE NEGATIVE Final    Comment:        The GeneXpert MRSA Assay (FDA approved for NASAL specimens only), is one component of a comprehensive MRSA colonization surveillance program. It is not intended to diagnose MRSA infection nor to guide or monitor treatment for MRSA infections. Performed at [facility], [address] [place], [place] [address]   Aerobic/Anaerobic Culture (surgical/deep wound)     Status: None   Collection Time: 08/01/20  7:37 PM   Specimen: Urine, Catheterized  Result Value Ref Range Status   Specimen Description   Final    URINE, CATHETERIZED URINE FROM DRAIN Performed at [facility], [address], [place], [place] [address]    Special Requests   Final    NONE Performed at [facility], [address], [place], [place] [address]    Gram Stain   Final    ABUNDANT WBC PRESENT, PREDOMINANTLY PMN RARE GRAM NEGATIVE RODS RARE GRAM POSITIVE RODS    Culture   Final    FEW ESCHERICHIA COLI NO ANAEROBES ISOLATED Performed at [facility], [address], [place], [place] [address]    Report Status 08/07/2020 FINAL  Final   Organism ID, Bacteria ESCHERICHIA COLI  Final      Susceptibility   Escherichia coli - MIC*    AMPICILLIN 4 SENSITIVE Sensitive     CEFAZOLIN <=4 SENSITIVE Sensitive     CEFEPIME <=0.12 SENSITIVE Sensitive     CEFTRIAXONE <=0.25 SENSITIVE Sensitive     CIPROFLOXACIN <=0.25 SENSITIVE Sensitive  GENTAMICIN <=1 SENSITIVE Sensitive     IMIPENEM <=0.25 SENSITIVE Sensitive     NITROFURANTOIN <=16 SENSITIVE Sensitive     TRIMETH/SULFA <=20 SENSITIVE Sensitive     AMPICILLIN/SULBACTAM <=2 SENSITIVE Sensitive     PIP/TAZO <=4 SENSITIVE Sensitive     * FEW ESCHERICHIA COLI  Aerobic/Anaerobic Culture (surgical/deep wound)     Status: None   Collection Time: 08/01/20  7:38 PM   Specimen: Abscess  Result Value Ref Range Status   Specimen Description   Final    ABSCESS Performed at [facility] [address], [place], [place] [address]    Special Requests   Final    NONE Performed at [facility], [place] [address], [place], [place] [address]    Gram Stain   Final    ABUNDANT WBC PRESENT, PREDOMINANTLY PMN MODERATE GRAM NEGATIVE RODS    Culture    Final    ABUNDANT ESCHERICHIA COLI NO ANAEROBES ISOLATED Performed at [facility], [place] [address], [place], [place] [address]    Report Status 08/07/2020 FINAL  Final   Organism ID, Bacteria ESCHERICHIA COLI  Final      Susceptibility   Escherichia coli - MIC*    AMPICILLIN <=2 SENSITIVE Sensitive     CEFAZOLIN <=4 SENSITIVE Sensitive     CEFEPIME <=0.12 SENSITIVE Sensitive     CEFTAZIDIME <=1 SENSITIVE Sensitive     CEFTRIAXONE <=0.25 SENSITIVE Sensitive     CIPROFLOXACIN <=0.25 SENSITIVE Sensitive     GENTAMICIN <=1 SENSITIVE Sensitive     IMIPENEM <=0.25 SENSITIVE Sensitive     TRIMETH/SULFA <=20 SENSITIVE Sensitive     AMPICILLIN/SULBACTAM <=2 SENSITIVE Sensitive     PIP/TAZO <=4 SENSITIVE Sensitive     * ABUNDANT ESCHERICHIA COLI    Procedures and diagnostic studies:  No results found.             LOS: 11 days   [employer], advertising on [email or web address]. If 7PM-7AM, please contact night-coverage at [email or web address]     08/11/2020, 3:22 PM

## 2020-08-11 NOTE — Plan of Care (Signed)
  Problem: Health Behavior/Discharge Planning: Goal: Ability to manage health-related needs will improve Outcome: Progressing   Problem: Clinical Measurements: Goal: Ability to maintain clinical measurements within normal limits will improve Outcome: Progressing Goal: Will remain free from infection Outcome: Progressing Goal: Diagnostic test results will improve Outcome: Progressing Goal: Cardiovascular complication will be avoided Outcome: Progressing   Problem: Activity: Goal: Risk for activity intolerance will decrease Outcome: Progressing   Problem: Elimination: Goal: Will not experience complications related to bowel motility Outcome: Progressing Goal: Will not experience complications related to urinary retention Outcome: Progressing   Problem: Pain Managment: Goal: General experience of comfort will improve Outcome: Progressing   Problem: Safety: Goal: Ability to remain free from injury will improve Outcome: Progressing   Problem: Skin Integrity: Goal: Risk for impaired skin integrity will decrease Outcome: Progressing

## 2020-08-11 NOTE — Progress Notes (Signed)
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   Clinically doing better after PCD and appropriate IV abx with (resolved sepsis), improved leukocytosis. Never had fever this admission. Despite pulm involvement/pleural effusion, she is doing well from respiratory standpoint and is not requiring supplemental oxygen  Given the increased abscess size, however, she would benefit from repeat abd/pelv imaging this week. If the abscess continues to increase, would discuss case with urology in regard to management of percutaneous drainage  PLAN: 1. Continue cefazolin 2 gram iv q8hours 2. Please repeat ct chest/abd/pelv within the next 48 hours 3. Final duration of abx depends on ct finding 4. Would also consider thoracentesis to r/o empyema  Principal Problem:   Severe sepsis (HCC) Active Problems:   Multifocal pneumonia   Hyperosmolar hyperglycemic state (HHS) (HCC)   Hyponatremia   Hypertension   Acute renal failure (HCC)   Hypokalemia   Severe protein-calorie malnutrition (HCC)   Class 1 obesity   Leukocytosis   Hypomagnesemia   Hyperphosphatemia   Abdominal  distention   Lung nodules   Delirium   Perirenal abscess   E coli bacteremia   Acute pyelonephritis   Bibasilar crackles   Diabetic ketoacidosis without coma associated with type 2 diabetes mellitus (HCC)   Ileus (HCC)   Scheduled Meds: . Chlorhexidine Gluconate Cloth  6 each Topical Daily  . feeding supplement (GLUCERNA SHAKE)  237 mL Oral BID BM  . heparin injection (subcutaneous)  5,000 Units Subcutaneous Q8H  . insulin aspart  0-20 Units Subcutaneous TID WC  . insulin aspart  0-5 Units Subcutaneous QHS  . insulin glargine  8 Units Subcutaneous BID  . potassium chloride  40 mEq Oral BID  . sodium chloride flush  10-40 mL Intracatheter Q12H  . sodium chloride flush  5 mL Intracatheter Q8H   Continuous Infusions: . sodium chloride    .  ceFAZolin (ANCEF) IV 2 g (08/11/20 0500)  . sodium chloride     PRN Meds:.sodium chloride, acetaminophen, albuterol, dextrose, ondansetron **OR** ondansetron (ZOFRAN) IV, polyvinyl alcohol, sodium chloride flush   SUBJECTIVE: Doing better Eating well No left flank pain No cough, chest pain, sob No headache No rash, joint pain No diarrhea  Review of Systems: ROS Other ros negative  Allergies  Allergen Reactions  . Codeine Rash    OBJECTIVE: Vitals:   08/10/20 1427 08/10/20 2024 08/11/20 0458 08/11/20 0500  BP: 112/72 133/79 (!) 144/78   Pulse: 81 89 91   Resp: 18 16 16    Temp: 98.2 F (36.8 C) 98.2 F (36.8 C) 98.2 F (36.8 C)   TempSrc: Oral  Oral   SpO2: 96% 96% 98%   Weight:    80.7 kg  Height:       Body mass index  is 32.54 kg/m.  Physical Exam Constitutional:      General: She is not in acute distress.    Appearance: She is well-developed.  HENT:     Head: Normocephalic.     Mouth/Throat:     Mouth: Mucous membranes are moist.     Pharynx: No oropharyngeal exudate.  Eyes:     Extraocular Movements: Extraocular movements intact.  Cardiovascular:     Rate and Rhythm: Normal rate and regular rhythm.      Heart sounds: Normal heart sounds.  Pulmonary:     Effort: Pulmonary effort is normal.  Abdominal:     General: There is no distension.     Palpations: Abdomen is soft.     Tenderness: There is no abdominal tenderness.  Skin:    General: Skin is warm.     Findings: No rash.     Comments: Left flank percutaneous nephrostomy tube site no discharge/tenderness/erythema  The catheter bag is draining purulent fluid  Neurological:     General: No focal deficit present.     Mental Status: She is alert.  Psychiatric:        Mood and Affect: Mood normal.     Lab Results Lab Results  Component Value Date   WBC 10.0 08/10/2020   HGB 9.0 (L) 08/10/2020   HCT 28.3 (L) 08/10/2020   MCV 94.6 08/10/2020   PLT 296 08/10/2020    Lab Results  Component Value Date   CREATININE 0.91 08/10/2020   BUN 20 08/10/2020   NA 139 08/10/2020   K 3.0 (L) 08/10/2020   CL 103 08/10/2020   CO2 27 08/10/2020    Lab Results  Component Value Date   ALT 11 08/10/2020   AST 22 08/10/2020   ALKPHOS 154 (H) 08/10/2020   BILITOT 0.5 08/10/2020     Microbiology: Recent Results (from the past 240 hour(s))  Urine Culture     Status: Abnormal   Collection Time: 08/01/20 11:28 AM   Specimen: Urine, Clean Catch  Result Value Ref Range Status   Specimen Description   Final    URINE, CLEAN CATCH Performed at [facility], [address] [place], [place] [address]    Special Requests   Final    NONE Performed at [facility], [address], [place], [place] [address]    Culture (A)  Final    <10,000 COLONIES/mL INSIGNIFICANT GROWTH Performed at [facility], [address] [place], [place] [address]    Report Status 08/02/2020 FINAL  Final  MRSA PCR Screening     Status: None   Collection Time: 08/01/20  3:26 PM   Specimen: Nasopharyngeal  Result Value Ref Range Status   MRSA by PCR NEGATIVE NEGATIVE Final    Comment:        The GeneXpert MRSA Assay (FDA approved for NASAL  specimens only), is one component of a comprehensive MRSA colonization surveillance program. It is not intended to diagnose MRSA infection nor to guide or monitor treatment for MRSA infections. Performed at [facility], [address] [place], [place] [address]   Aerobic/Anaerobic Culture (surgical/deep wound)     Status: None   Collection Time: 08/01/20  7:37 PM   Specimen: Urine, Catheterized  Result Value Ref Range Status   Specimen Description   Final    URINE, CATHETERIZED URINE FROM DRAIN Performed at [facility], [address] [place], [place] [address]    Special Requests   Final  NONE Performed at [facility], [employer] [address], [place], [place] [address]    Gram Stain   Final    ABUNDANT WBC PRESENT, PREDOMINANTLY PMN RARE GRAM NEGATIVE RODS RARE GRAM POSITIVE RODS    Culture   Final    FEW ESCHERICHIA COLI NO ANAEROBES ISOLATED Performed at [facility], [place] [address], [place], [place] [address]    Report Status 08/07/2020 FINAL  Final   Organism ID, Bacteria ESCHERICHIA COLI  Final      Susceptibility   Escherichia coli - MIC*    AMPICILLIN 4 SENSITIVE Sensitive     CEFAZOLIN <=4 SENSITIVE Sensitive     CEFEPIME <=0.12 SENSITIVE Sensitive     CEFTRIAXONE <=0.25 SENSITIVE Sensitive     CIPROFLOXACIN <=0.25 SENSITIVE Sensitive     GENTAMICIN <=1 SENSITIVE Sensitive     IMIPENEM <=0.25 SENSITIVE Sensitive     NITROFURANTOIN <=16 SENSITIVE Sensitive     TRIMETH/SULFA <=20 SENSITIVE Sensitive     AMPICILLIN/SULBACTAM <=2 SENSITIVE Sensitive     PIP/TAZO <=4 SENSITIVE Sensitive     * FEW ESCHERICHIA COLI  Aerobic/Anaerobic Culture (surgical/deep wound)     Status: None   Collection Time: 08/01/20  7:38 PM   Specimen: Abscess  Result Value Ref Range Status   Specimen Description   Final    ABSCESS Performed at [place] [facility] [address], [place], [place] [address]     Special Requests   Final    NONE Performed at [facility], [place] [address] [place], [place] [address]    Gram Stain   Final    ABUNDANT WBC PRESENT, PREDOMINANTLY PMN MODERATE GRAM NEGATIVE RODS    Culture   Final    ABUNDANT ESCHERICHIA COLI NO ANAEROBES ISOLATED Performed at [facility], [place] [address], [place], [place] [address]    Report Status 08/07/2020 FINAL  Final   Organism ID, Bacteria ESCHERICHIA COLI  Final      Susceptibility   Escherichia coli - MIC*    AMPICILLIN <=2 SENSITIVE Sensitive     CEFAZOLIN <=4 SENSITIVE Sensitive     CEFEPIME <=0.12 SENSITIVE Sensitive     CEFTAZIDIME <=1 SENSITIVE Sensitive     CEFTRIAXONE <=0.25 SENSITIVE Sensitive     CIPROFLOXACIN <=0.25 SENSITIVE Sensitive     GENTAMICIN <=1 SENSITIVE Sensitive     IMIPENEM <=0.25 SENSITIVE Sensitive     TRIMETH/SULFA <=20 SENSITIVE Sensitive     AMPICILLIN/SULBACTAM <=2 SENSITIVE Sensitive     PIP/TAZO <=4 SENSITIVE Sensitive     * ABUNDANT ESCHERICHIA COLI    Serology: 12/24 hiv screen negative  Imaging: 12/31 abd/pelv ct with contrast 1. Compared to the prior study there is been interval nephrostomy tube placement which appears appropriately located, as well as percutaneous drainage catheter placement in the inferior aspect of the patient's extensive perinephric abscess. Unfortunately, the overall size of the abscess appears increased compared to the prior examination, and there is new high attenuation fluid within the inferior aspect of this collection, which could represent some recent hemorrhage. If there is concern for active hemorrhage, further evaluation with triple-phase CT of the abdomen and pelvis should be considered to evaluate for source of active bleeding. 2. Numerous large pulmonary nodules and masses in the lungs bilaterally, with imaging characteristics most compatible with extensive septic emboli with multifocal pulmonary abscess  formation. 3. 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 This likely represents the  source of emboli in this patient with evidence of septic embolization. 4. Moderate bilateral pleural effusions lying dependently with associated areas of passive atelectasis in the lower lobes of the lungs bilaterally. 5. Dilatation of the pulmonic trunk (3.6 cm in diameter), concerning for pulmonary arterial hypertension. 6. Additional incidental findings, as above.   12/24 abd/pelv ct with contrast Multiple BILATERAL pulmonary nodules up to 12 mm diameter, some of which appear ill-defined and slightly shaggy.  These could represent metastatic foci or septic emboli.  Correlation with cardiology recommended to exclude source of septic embolism.  4 mm LEFT UPJ calculus with mild LEFT hydronephrosis and hydroureter.  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  Recommend annual imaging followup by CTA or MRA. This recommendation follows 2010 ACCF/AHA/AATS/ACR/ASA/SCA/SCAI/SIR/STS/SVM Guidelines for the Diagnosis and Management of Patients with Thoracic Aortic Disease. Circulation. 2010; 121: Y248-G500. Aortic aneurysm NOS (ICD10-I71.9)  Aortic Atherosclerosis (ICD10-I70.0)  Raymondo Band, MD [facility] [phone] pager    08/11/2020, 9:59 AM

## 2020-08-11 NOTE — Progress Notes (Signed)
Referring Physician(s): Pace,M  Supervising Physician: Simonne Come  Patient Status:  [facility] - In-pt  Chief Complaint:  Left emphysematous pyelonephritis/retroperitoneal abscess  Subjective: Patient doing fairly well today; denies worsening left flank pain, nausea, vomiting   Allergies: Codeine  Medications: Prior to Admission medications   Medication Sig Start Date End Date Taking? Authorizing Provider  acetaminophen (TYLENOL) 500 MG tablet Take 1,000 mg by mouth every 6 (six) hours as needed.   Yes [provider]  Pseudoeph-Doxylamine-DM-APAP (NYQUIL PO) Take 1 tablet by mouth daily as needed (congestion).   Yes [provider]     Vital Signs: BP (!) 144/78 (BP Location: Left Arm)   Pulse 91   Temp 98.2 F (36.8 C) (Oral)   Resp 16   Ht 5\' 2"  (1.575 m)   Wt 177 lb 14.6 oz (80.7 kg)   LMP  (LMP Unknown)   SpO2 98%   BMI 32.54 kg/m   Physical Exam awake, alert.  Left nephrostomy and left retroperitoneal abscess drains intact suction sites okay, mildly tender to palpation, output left nephrostomy 375 cc yellow urine, left retroperitoneal abscess drain with  250 cc of purulent dark red fluid; both drains irrigated Imaging: DG Chest 2 View  Result Date: 08/07/2020 CLINICAL DATA:  Sepsis.  Left pyelonephritis. EXAM: CHEST - 2 VIEW COMPARISON:  08/04/2020 FINDINGS: Stable cardiomegaly. Small left pleural effusion and left basilar atelectasis or infiltrate show no significant change. Probable small layering right pleural effusion also noted. Increased airspace opacity is seen in the right upper lobe since prior study. IMPRESSION: Increased right upper lobe airspace opacity. Stable small left pleural effusion, and left basilar atelectasis versus infiltrate. Probable small layering right pleural effusion. Electronically Signed   By: 08/06/2020 M.D.   On: 08/07/2020 12:54   CT CHEST ABDOMEN PELVIS W CONTRAST  Result Date: 08/08/2020 CLINICAL DATA:   60 year old female with history of complicated pyelonephritis. EXAM: CT CHEST, ABDOMEN, AND PELVIS WITH CONTRAST TECHNIQUE: Multidetector CT imaging of the chest, abdomen and pelvis was performed following the standard protocol during bolus administration of intravenous contrast. CONTRAST:  46 OMNIPAQUE IOHEXOL 300 MG/ML  SOLN COMPARISON:  CT the chest, abdomen and pelvis 08/01/2020. FINDINGS: CT CHEST FINDINGS Cardiovascular: Heart size is normal. There is no significant pericardial fluid, thickening or pericardial calcification. There is aortic atherosclerosis, as well as atherosclerosis of the great vessels of the mediastinum and the coronary arteries, including calcified atherosclerotic plaque in the left anterior descending coronary artery. Dilatation of the pulmonic trunk (3.6 cm in diameter). Mediastinum/Nodes: No pathologically enlarged in mediastinal or hilar lymph nodes. Esophagus is unremarkable in appearance. No axillary lymphadenopathy. Lungs/Pleura: Numerous pulmonary nodules and masses are again noted throughout the lungs bilaterally, many of which have central areas of cavitation. The largest of these is in the posterior aspect of the right upper lobe (axial image 54 of series 5) measuring 3.2 x 3.2 cm, with central low attenuation regions and internal cavitation, compatible with a large intrapulmonary abscess. Overall, findings are strongly suggestive of widespread septic emboli. Moderate bilateral pleural effusions lying dependently with extensive passive atelectasis in the lower lobes of the lungs bilaterally. Musculoskeletal: There are no aggressive appearing lytic or blastic lesions noted in the visualized portions of the skeleton. CT ABDOMEN PELVIS FINDINGS Hepatobiliary: No suspicious cystic or solid hepatic lesions. No intra or extrahepatic biliary ductal dilatation. Gallbladder is normal in appearance. Pancreas: No pancreatic mass. No pancreatic ductal dilatation. No peripancreatic fluid  collections or inflammatory changes. Spleen:  Unremarkable. Adrenals/Urinary Tract: New nephrostomy tube extends into the left renal pelvis and appears appropriately located. The left kidneys enlarged and very heterogeneous in appearance with multiple hypovascular hypoenhancing regions, compatible with residual areas of pyelonephritis and associated renal and perinephric abscesses. These extend into the perinephric space where there is a combination of gas and fluid. Some of this tracks caudally where there is a larger collection of predominantly fluid, trace amount of gas, and some higher attenuation material adjacent to the tip of an indwelling drain (best appreciated on axial image 89 of series 2 and coronal image 74 of series 6) which measures approximately 4.3 x 2.1 x 3.6 cm. This perinephric fluid collection trucks caudally into the left hemipelvis along the left pelvic sidewall, coming into contact with the left iliacus musculature. Overall, the extent of fluid in this collection appears increased. This is well demonstrated in a relatively well-defined portion of the collection laterally which currently measures 9.5 x 3.6 cm (axial image 78 of series 2) and previously measured only 8.2 x 2.4 cm when measured in a similar fashion on 08/01/2020. Right kidney and bilateral adrenal glands are normal in appearance. No right hydroureteronephrosis. Urinary bladder is completely decompressed around an indwelling Foley balloon catheter. Small amount of gas present in the lumen of the urinary bladder is presumably iatrogenic. Stomach/Bowel: The appearance of the stomach is normal. There is no pathologic dilatation of small bowel or colon. Normal appendix. Vascular/Lymphatic: Aortic atherosclerosis, without evidence of aneurysm or dissection in the abdominal or pelvic vasculature. Filling defect in the left renal vein best appreciated on coronal image 77 of series 6, compatible with nonocclusive left renal vein thrombus.  No lymphadenopathy noted in the abdomen or pelvis. Reproductive: Uterus and ovaries are unremarkable in appearance. Other: Diffuse body wall edema. Trace volume of ascites. No pneumoperitoneum. Musculoskeletal: There are no aggressive appearing lytic or blastic lesions noted in the visualized portions of the skeleton. IMPRESSION: 1. Compared to the prior study there is been interval nephrostomy tube placement which appears appropriately located, as well as percutaneous drainage catheter placement in the inferior aspect of the patient's extensive perinephric abscess. Unfortunately, the overall size of the abscess appears increased compared to the prior examination, and there is new high attenuation fluid within the inferior aspect of this collection, which could represent some recent hemorrhage. If there is concern for active hemorrhage, further evaluation with triple-phase CT of the abdomen and pelvis should be considered to evaluate for source of active bleeding. 2. Numerous large pulmonary nodules and masses in the lungs bilaterally, with imaging characteristics most compatible with extensive septic emboli with multifocal pulmonary abscess formation. 3. 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 This likely represents the source of emboli in this patient with evidence of septic embolization. 4. Moderate bilateral pleural effusions lying dependently with associated areas of passive atelectasis in the lower lobes of the lungs bilaterally. 5. Dilatation of the pulmonic trunk (3.6 cm in diameter), concerning for pulmonary arterial hypertension. 6. Additional incidental findings, as above. These results were called by telephone at the time of interpretation on 08/08/2020 at 5:38 pm to provider [facility], who verbally acknowledged these results. Electronically Signed   By: Trudie Reed M.D.   On: 08/08/2020 17:38   DG Abd 2  Views  Result Date: 08/08/2020 CLINICAL DATA:  Abdominal pain and decreased oral intake. EXAM: ABDOMEN - 2 VIEW COMPARISON:  Plain film of the abdomen dated 08/07/2020. FINDINGS: Mildly distended  small bowel within the central abdomen with nonspecific air-fluid levels in the upper pelvis. Air is again seen within the colon. No evidence of renal or ureteral calculi. Two percutaneous pigtail catheters are again seen within the LEFT abdomen. Dense consolidation the LEFT lung base, presumed pneumonia. IMPRESSION: 1. Mildly distended small bowel within the central abdomen with nonspecific air-fluid levels in the upper pelvis. Findings could represent ileus or partial small bowel obstruction. 2. LEFT lower lung consolidation, pneumonia versus atelectasis. Electronically Signed   By: Franki Cabot M.D.   On: 08/08/2020 10:04   DG Abd 2 Views  Result Date: 08/07/2020 CLINICAL DATA:  Sepsis.  Left pyelonephritis. EXAM: ABDOMEN - 2 VIEW COMPARISON:  None. FINDINGS: Mild gaseous distention of colon is seen predominately involving the nondependent sigmoid colon. Scattered air-fluid levels are seen, however there is no evidence of dilated small bowel loops. These findings are likely due to mild ileus. No radiopaque urinary calculi identified. Two percutaneous pigtail catheters are seen in the left upper and lower quadrants. IMPRESSION: Probable mild adynamic ileus. Two percutaneous pigtail catheters in left upper and lower quadrants. Electronically Signed   By: Marlaine Hind M.D.   On: 08/07/2020 12:51   [place] EKG SITE RITE  Result Date: 08/09/2020 If Site Rite image not attached, placement could not be confirmed due to current cardiac rhythm.   Labs:  CBC: Recent Labs    08/07/20 0506 08/08/20 0823 08/09/20 0528 08/10/20 0522  WBC 15.2* 11.1* 10.6* 10.0  HGB 10.6* 9.9* 9.0* 9.0*  HCT 33.0* 30.8* 28.0* 28.3*  PLT 317 327 297 296    COAGS: No results for input(s): INR, APTT in the last 8760  hours.  BMP: Recent Labs    08/07/20 0506 08/08/20 0823 08/09/20 0528 08/10/20 0522  NA 135 138 137 139  K 3.2* 3.1* 3.6 3.0*  CL 94* 97* 100 103  CO2 29 30 28 27   GLUCOSE 113* 80 126* 132*  BUN 41* 32* 24* 20  CALCIUM 8.3* 8.1* 8.0* 8.0*  CREATININE 1.27* 0.98 1.03* 0.91  GFRNONAA 49* >60 >60 >60    LIVER FUNCTION TESTS: Recent Labs    07/31/20 1913 08/01/20 0641 08/08/20 0823 08/09/20 0528 08/10/20 0522  BILITOT 1.3* 0.9  --  0.7 0.5  AST 14* 19  --  22 22  ALT 16 19  --  8 11  ALKPHOS 147* 142*  --  165* 154*  PROT 5.4* 5.4*  --  5.1* 5.2*  ALBUMIN 2.0* 2.1* 2.0* 2.0* 2.2*    Assessment and Plan: Patient with history of left emphysematous pyelonephritis with associated retroperitoneal abscess, status post left nephrostomy and left retroperitoneal/pelvic abscess drain placements 08/01/20; afebrile, prev cultures with E. Coli; continue to closely monitor drain outputs and labs; obtain follow-up CT later this week  Electronically Signed: D. Rowe Robert, PA-C 08/11/2020, 11:54 AM   I spent a total of 15 minutes at the the patient's bedside AND on the patient's hospital floor or unit, greater than 50% of which was counseling/coordinating care for left nephrostomy and left retroperitoneal/pelvic abscess drain    Patient ID: Tammie [name], female   DOB: 09/30/1960, 60 y.o.   MRN: [id number]

## 2020-08-12 DIAGNOSIS — J90 Pleural effusion, not elsewhere classified: Secondary | ICD-10-CM

## 2020-08-12 LAB — BASIC METABOLIC PANEL
Anion gap: 9 (ref 5–15)
BUN: 19 mg/dL (ref 6–20)
CO2: 25 mmol/L (ref 22–32)
Calcium: 8.2 mg/dL — ABNORMAL LOW (ref 8.9–10.3)
Chloride: 106 mmol/L (ref 98–111)
Creatinine, Ser: 0.94 mg/dL (ref 0.44–1.00)
GFR, Estimated: >60 mL/min (ref >60)
Glucose, Bld: 127 mg/dL — ABNORMAL HIGH (ref 70–99)
Potassium: 3.9 mmol/L (ref 3.5–5.1)
Sodium: 140 mmol/L (ref 135–145)

## 2020-08-12 LAB — CBC WITH DIFFERENTIAL/PLATELET
Abs Immature Granulocytes: 0.02 10*3/uL (ref 0.00–0.07)
Basophils Absolute: 0 10*3/uL (ref 0.0–0.1)
Basophils Relative: 0 %
Eosinophils Absolute: 0.2 10*3/uL (ref 0.0–0.5)
Eosinophils Relative: 3 %
HCT: 27.8 % — ABNORMAL LOW (ref 36.0–46.0)
Hemoglobin: 8.7 g/dL — ABNORMAL LOW (ref 12.0–15.0)
Immature Granulocytes: 0 %
Lymphocytes Relative: 16 %
Lymphs Abs: 1 10*3/uL (ref 0.7–4.0)
MCH: 30.2 pg (ref 26.0–34.0)
MCHC: 31.3 g/dL (ref 30.0–36.0)
MCV: 96.5 fL (ref 80.0–100.0)
Monocytes Absolute: 0.6 10*3/uL (ref 0.1–1.0)
Monocytes Relative: 10 %
Neutro Abs: 4.4 10*3/uL (ref 1.7–7.7)
Neutrophils Relative %: 71 %
Platelets: 283 10*3/uL (ref 150–400)
RBC: 2.88 MIL/uL — ABNORMAL LOW (ref 3.87–5.11)
RDW: 14.6 % (ref 11.5–15.5)
WBC: 6.2 10*3/uL (ref 4.0–10.5)
nRBC: 0 % (ref 0.0–0.2)

## 2020-08-12 LAB — GLUCOSE, CAPILLARY
Glucose-Capillary: 110 mg/dL — ABNORMAL HIGH (ref 70–99)
Glucose-Capillary: 96 mg/dL (ref 70–99)
Glucose-Capillary: 123 mg/dL — ABNORMAL HIGH (ref 70–99)
Glucose-Capillary: 134 mg/dL — ABNORMAL HIGH (ref 70–99)

## 2020-08-12 LAB — MAGNESIUM: Magnesium: 1.7 mg/dL (ref 1.7–2.4)

## 2020-08-12 NOTE — Progress Notes (Signed)
Progress Note    Tammie [name]  IWL:[id number] DOB: 1961-03-27  DOA: 07/31/2020 PCP: Patient, No Pcp Per      Brief Narrative:    Medical records reviewed and are as summarized below:  Tammie [name] is a 60 y.o. female with medical history significant for hypertension, obesity, type II DM, who was admitted 12/24 with chief complaint of abdominal pain.  She was found to have acute pyelonephritis with left renal abscess, severe sepsis, acute kidney injury, hyper osmolar nonketotic hyperglycemia (initial blood sugar in the  than 700s), severe hyponatremia. In the ED: CT imaging showed multiple pulmonary nodules, imaging of abdomen pelvis With left hydronephrosis and evidence of perinephritic abscess.  She was transferred to vascular hospital for placement of left nephrostomy tube and left pelvic abscess drain.  She was initially admitted to the ICU where she was given 3% hypertonic saline infusion for severe hyponatremia.  She was also treated with empiric IV antibiotics for severe sepsis secondary to left pyelonephritis, left renal abscess and pelvic abscess.    Assessment/Plan:   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 with type 2 diabetes mellitus (HCC)   Ileus (HCC)   Emphysematous pyelonephritis of left kidney   Pleural effusion   Nutrition Problem: Food and nutrition related knowledge deficit Etiology: limited prior education  Signs/Symptoms: per patient/family report (New DM)   Body mass index is 31.69 kg/m.   (Obesity)      Severe sepsis with E. coli bacteremia/left emphysematous pyelonephritis with perinephric abscess - Status post percutaneous nephrostomy and perirenal abscess drainage 08/01/2020.    Antibiotics have been deescalated to IV Ancef. - Urology following and repeat CT abdomen and pelvis which was done negative for ileus however did show worsening perinephric abscesses, and questionable hemorrhage, complicated emphysematous left-sided pyelonephritis with nonocclusive thrombus forming in the left renal vein likely represents source of emboli.   - As patient clinically improving urology recommending to continue current regimen of IV antibiotics, supportive care and repeat CT abdomen and pelvis  tomorrow - CT chest concerning for numerous large pulmonary nodules or masses bilaterally compatible with extensive septic emboli with multifocal pulmonary abscess formation.   - ID following given worsening imaging.   - IR following and managing drains  AKI without known history of CKD - Underlying baseline renal function unknown.   - Likely multifactorial secondary to emphysematous pyelonephritis with abscess, ischemic ATN in the setting of volume depletion.   - Status post percutaneous nephrostomy and perirenal abscess drainage.   - Foley catheter in place.  - Repeat CT abdomen and pelvis with worsening abdominal abscesses however patient improving clinically.  Urology following.   Questionable ileus, resolving - Abdominal films obtained concerning for possible ileus.  CT abdomen and pelvis obtained negative for ileus. - Continue to advance diet as tolerated  Hypokalemia/hypomagnesemia Potassium level has normalized but magnesium level is still low at 1.3.  Replete magnesium with IV magnesium sulfate.  Newly diagnosed diabetes mellitus type 2/HHNK Hemoglobin A1c noted at 12.2.  Continue Lantus 8 units twice daily.  Sliding scale insulin.  Blood sugars have been stable. Diabetic  coordinator following.   Severe hyponatremia, resolved Etiology is unclear.  Initially required treatment with hypertonic saline in the ICU. Sodium has since normalized  Multiple pulmonary nodules Differential includes septic emboli versus metastatic malignancy. Pulmonary recommended when repeat CT is done to get a repeat chest at that same time.  Patient will need outpatient follow-up with pulmonary.  CT chest done with numerous large pulmonary nodules and masses bilaterally in the lungs with characteristics compatible with septic emboli and multiple pulmonary abscess formation, moderate bilateral pleural effusions. Per pulmonary patient will need repeat CT chest done in approximately 6 weeks.   Bibasilar crackles/moderate bilateral pleural effusion She had received IV Lasix on 08/09/2020 and 08/10/2020..     Diet Order            Diet Carb Modified Fluid consistency: Thin; Room service appropriate? Yes  Diet effective now                    Consultants:  Intensivist  Urologist  Interventional radiologist  General surgeon  Nephrologist  Procedures:  CT-guided placement of left nephrostomy tube and left pelvic abscess drain on 08/01/2020    Medications:   . Chlorhexidine Gluconate Cloth  6 each Topical Daily  . feeding supplement (GLUCERNA SHAKE)  237 mL Oral BID BM  . heparin injection (subcutaneous)  5,000 Units Subcutaneous Q8H  . insulin aspart  0-20 Units Subcutaneous TID WC  . insulin aspart  0-5 Units Subcutaneous QHS  . insulin glargine  8 Units Subcutaneous BID  . potassium chloride  40 mEq Oral BID  . sodium chloride flush  10-40 mL Intracatheter Q12H  . sodium chloride flush  5 mL Intracatheter Q8H   Continuous Infusions: . sodium chloride 250 mL (08/11/20 2300)  .  ceFAZolin (ANCEF) IV 2 g (08/12/20 1515)  . sodium chloride       Anti-infectives (From admission, onward)   Start     Dose/Rate Route Frequency Ordered Stop   08/05/20 1000   ceFAZolin (ANCEF) IVPB 2g/100 mL premix        2 g 200 mL/hr over 30 Minutes Intravenous Every 8 hours 08/05/20 0903     08/04/20 2200  ceFAZolin (ANCEF) IVPB 2g/100 mL premix  Status:  Discontinued        2 g 200 mL/hr over 30 Minutes Intravenous Every 12 hours 08/04/20 1131 08/05/20 0903   08/02/20 0000  piperacillin-tazobactam (ZOSYN) IVPB 3.375 g  Status:  Discontinued        3.375 g 12.5 mL/hr over 240 Minutes Intravenous Every 12 hours 08/01/20 1403 08/01/20 2006   08/01/20 2100  cefTRIAXone (ROCEPHIN) 2 g in sodium chloride 0.9 % 100 mL IVPB  Status:  Discontinued        2 g 200 mL/hr over 30 Minutes Intravenous Every 24 hours 08/01/20 2006 08/04/20 1131   08/01/20 1430  piperacillin-tazobactam (ZOSYN) IVPB 3.375 g        3.375 g 100 mL/hr over 30 Minutes Intravenous  Once 08/01/20 1355 08/01/20 1502   07/31/20 2000  cefTRIAXone (ROCEPHIN) 2 g in sodium chloride 0.9 % 100 mL IVPB  Status:  Discontinued        2 g 200 mL/hr over 30 Minutes Intravenous Every 24 hours 07/31/20 1947 08/01/20 1351   07/31/20 2000  azithromycin (ZITHROMAX) 500 mg in sodium chloride 0.9 % 250 mL IVPB  Status:  Discontinued        500 mg 250 mL/hr over 60 Minutes Intravenous Every 24 hours 07/31/20 1947 08/01/20 1351             Family Communication/Anticipated D/C date and plan/Code Status   DVT prophylaxis: heparin injection 5,000 Units Start: 08/05/20 1500     Code Status: Full Code  Family Communication: discussed with husband at the bedside Disposition Plan:    Status is: Inpatient  Remains inpatient appropriate because:IV treatments appropriate due to intensity of illness or inability to take PO and Inpatient level of care appropriate due to severity of illness   Dispo: The patient is from: Home              Anticipated d/c is to: Home              Anticipated d/c date is: > 3 days              Patient currently is not medically stable to d/c.     Subjective:   No  abdominal pain, nausea or vomiting.  Objective:    Vitals:   08/11/20 2036 08/12/20 0448 08/12/20 0500 08/12/20 1324  BP: (!) 142/83 139/82  134/74  Pulse: 95 86  89  Resp: 18 20  18   Temp: 99.3 F (37.4 C) 98 F (36.7 C)  98.6 F (37 C)  TempSrc: Oral Oral  Oral  SpO2: 96% 97%  94%  Weight:   78.6 kg   Height:       No data found.   Intake/Output Summary (Last 24 hours) at 08/12/2020 1820 Last data filed at 08/12/2020 1300 Gross per 24 hour  Intake 1415.7 ml  Output 2281 ml  Net -865.3 ml   Filed Weights   08/10/20 0500 08/11/20 0500 08/12/20 0500  Weight: 81.7 kg 80.7 kg 78.6 kg    Exam:  General exam: Alert, awake, oriented x 3 Respiratory system: Clear to auscultation. Respiratory effort normal. Cardiovascular system:RRR. No murmurs, rubs, gallops. Gastrointestinal system: Abdomen is nondistended, soft and nontender. No organomegaly or masses felt. Normal bowel sounds heard. Left nephrostomy in place Central nervous system: Alert and oriented. No focal neurological deficits. Extremities: No C/C/E, +pedal pulses Skin: No rashes, lesions or ulcers Psychiatry: Judgement and insight appear normal. Mood & affect appropriate.    Data Reviewed:   I have personally reviewed following labs and imaging studies:  Labs: Labs show the following:   Basic Metabolic Panel: Recent Labs  Lab 08/06/20 0220 08/07/20 0506 08/08/20 0502 08/08/20 0823 08/09/20 0528 08/10/20 0522 08/11/20 1156 08/12/20 0321  NA 135   < >  --  138 137 139 140 140  K 3.8   < >  --  3.1* 3.6 3.0* 3.6 3.9  CL 94*   < >  --  97* 100 103 104 106  CO2 29   < >  --  30 28 27 25 25   GLUCOSE 87   < >  --  80 126* 132* 193* 127*  BUN 56*   < >  --  32* 24* 20 16 19   CREATININE 1.61*   < >  --  0.98 1.03* 0.91 0.92 0.94  CALCIUM 8.1*   < >  --  8.1* 8.0* 8.0* 8.1* 8.2*  MG 1.6*   < > 2.0  --  1.6* 2.1 1.3* 1.7  PHOS 3.4  --   --  2.8 2.9 2.9  --   --    < > =  values in this interval not  displayed.   GFR Estimated Creatinine Clearance: 62.6 mL/min (by C-G formula based on SCr of 0.94 mg/dL). Liver Function Tests: Recent Labs  Lab 08/08/20 0823 08/09/20 0528 08/10/20 0522  AST  --  22 22  ALT  --  8 11  ALKPHOS  --  165* 154*  BILITOT  --  0.7 0.5  PROT  --  5.1* 5.2*  ALBUMIN 2.0* 2.0* 2.2*   No results for input(s): LIPASE, AMYLASE in the last 168 hours. No results for input(s): AMMONIA in the last 168 hours. Coagulation profile No results for input(s): INR, PROTIME in the last 168 hours.  CBC: Recent Labs  Lab 08/08/20 0823 08/09/20 0528 08/10/20 0522 08/11/20 1156 08/12/20 0321  WBC 11.1* 10.6* 10.0 6.9 6.2  NEUTROABS 9.1* 8.9* 8.1* 5.0 4.4  HGB 9.9* 9.0* 9.0* 8.7* 8.7*  HCT 30.8* 28.0* 28.3* 27.4* 27.8*  MCV 94.5 96.2 94.6 95.1 96.5  PLT 327 297 296 268 283   Cardiac Enzymes: No results for input(s): CKTOTAL, CKMB, CKMBINDEX, TROPONINI in the last 168 hours. BNP (last 3 results) No results for input(s): PROBNP in the last 8760 hours. CBG: Recent Labs  Lab 08/11/20 1636 08/11/20 2116 08/12/20 0731 08/12/20 1256 08/12/20 1636  GLUCAP 124* 140* 110* 96 123*   D-Dimer: No results for input(s): DDIMER in the last 72 hours. Hgb A1c: No results for input(s): HGBA1C in the last 72 hours. Lipid Profile: No results for input(s): CHOL, HDL, LDLCALC, TRIG, CHOLHDL, LDLDIRECT in the last 72 hours. Thyroid function studies: No results for input(s): TSH, T4TOTAL, T3FREE, THYROIDAB in the last 72 hours.  Invalid input(s): FREET3 Anemia work up: No results for input(s): VITAMINB12, FOLATE, FERRITIN, TIBC, IRON, RETICCTPCT in the last 72 hours. Sepsis Labs: Recent Labs  Lab 08/09/20 0528 08/10/20 0522 08/11/20 1156 08/12/20 0321  WBC 10.6* 10.0 6.9 6.2    Microbiology No results found for this or any previous visit (from the past 240 hour(s)).  Procedures and diagnostic studies:  No results found.     LOS: 12 days   [employer] on [email or web address]. If 7PM-7AM, please contact night-coverage at [email or web address]     08/12/2020, 6:20 PM

## 2020-08-12 NOTE — Progress Notes (Signed)
Urology Inpatient Progress Report      Intv/Subj: No acute events overnight. Patient is without complaint.  Clinically she continues to improve.   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 with type 2 diabetes mellitus (HCC)   Ileus (HCC)   Emphysematous pyelonephritis of left kidney  Current Facility-Administered Medications  Medication Dose Route Frequency Provider Last Rate Last Admin  . 0.9 %  sodium chloride infusion   Intravenous PRN Lurene Shadow, MD 10 mL/hr at 08/11/20 2300 250 mL at 08/11/20 2300  . acetaminophen (TYLENOL) tablet 650 mg  650 mg Oral Q4H PRN Rodolph Bong, MD      . albuterol (PROVENTIL) (2.5 MG/3ML) 0.083% nebulizer solution 2.5 mg  2.5 mg Nebulization Q4H PRN Simonne Martinet, NP      . ceFAZolin (ANCEF) IVPB 2g/100 mL premix  2 g Intravenous Q8H Simonne Martinet, NP 200 mL/hr at 08/12/20 0614 2 g at 08/12/20 0614  . Chlorhexidine Gluconate Cloth 2 % PADS 6 each  6 each Topical Daily Simonne Martinet, NP   6 each at 08/11/20 1011  . dextrose 50 % solution 0-50 mL  0-50 mL Intravenous PRN Simonne Martinet, NP      . feeding supplement (GLUCERNA SHAKE) (GLUCERNA SHAKE) liquid 237 mL  237 mL Oral BID BM Rodolph Bong, MD   237 mL at 08/09/20 1427  . heparin injection 5,000 Units  5,000 Units Subcutaneous Q8H Simonne Martinet, NP   5,000 Units at 08/12/20 [phone]  . insulin aspart (novoLOG) injection 0-20 Units  0-20 Units Subcutaneous TID WC Simonne Martinet, NP   3 Units at 08/11/20 1703  .  insulin aspart (novoLOG) injection 0-5 Units  0-5 Units Subcutaneous QHS Simonne Martinet, NP      . insulin glargine (LANTUS) injection 8 Units  8 Units Subcutaneous BID Rodolph Bong, MD   8 Units at 08/11/20 2110  . ondansetron (ZOFRAN) tablet 4 mg  4 mg Oral Q6H PRN Simonne Martinet, NP       Or  . ondansetron (ZOFRAN) injection 4 mg  4 mg Intravenous Q6H PRN Simonne Martinet, NP      . polyvinyl alcohol (LIQUIFILM TEARS) 1.4 % ophthalmic solution 1 drop  1 drop Both Eyes PRN Rodolph Bong, MD      . potassium chloride SA (KLOR-CON) CR tablet 40 mEq  40 mEq Oral BID Marlin Canary U, DO   40 mEq at 08/11/20 2111  . sodium chloride 0.9 % bolus 1,588 mL  20 mL/kg Intravenous Once Zenia Resides E, NP      . sodium chloride flush (NS) 0.9 % injection 10-40 mL  10-40 mL Intracatheter Q12H Kasandra Knudsen D, MD   10 mL at 08/11/20 2112  . sodium chloride flush (NS) 0.9 % injection 10-40 mL  10-40 mL Intracatheter PRN Kasandra Knudsen D, MD   10 mL at 08/12/20 0322  . sodium chloride flush (NS) 0.9 % injection 5 mL  5 mL  Intracatheter Q8H Simonne Martinet, NP   5 mL at 08/12/20 4034     Objective: Vital: Vitals:   08/11/20 1214 08/11/20 2036 08/12/20 0448 08/12/20 0500  BP: 120/62 (!) 142/83 139/82   Pulse: 93 95 86   Resp: (!) 22 18 20    Temp: 98.8 F (37.1 C) 99.3 F (37.4 C) 98 F (36.7 C)   TempSrc: Oral Oral Oral   SpO2: 94% 96% 97%   Weight:    78.6 kg  Height:       I/Os: I/O last 3 completed shifts: In: 2227.6 [P.O.:930; I.V.:192.8; Other:45; IV Piggyback:1059.8] Out: 4151 [Urine:3690; Drains:460; Stool:1]  Physical Exam:  General: Patient is in no apparent distress Lungs: Normal respiratory effort, chest expands symmetrically. GI: The abdomen is soft and nontender  L PCN tube: draining yellow urine with some debris L perinephric drain: dark red urine mixed with pus Foley: clear yellow urine Ext: lower extremities symmetric  Lab Results: Recent Labs     08/10/20 0522 08/11/20 1156 08/12/20 0321  WBC 10.0 6.9 6.2  HGB 9.0* 8.7* 8.7*  HCT 28.3* 27.4* 27.8*   Recent Labs    08/10/20 0522 08/11/20 1156 08/12/20 0321  NA 139 140 140  K 3.0* 3.6 3.9  CL 103 104 106  CO2 27 25 25   GLUCOSE 132* 193* 127*  BUN 20 16 19   CREATININE 0.91 0.92 0.94  CALCIUM 8.0* 8.1* 8.2*   No results for input(s): LABPT, INR in the last 72 hours. No results for input(s): LABURIN in the last 72 hours. Results for orders placed or performed during the hospital encounter of 07/31/20  Culture, blood (single)     Status: Abnormal   Collection Time: 07/31/20  5:26 PM   Specimen: BLOOD  Result Value Ref Range Status   Specimen Description   Final    BLOOD LEFT ANTECUBITAL Performed at [facility], [address], [place], [facility] [address]    Special Requests   Final    BOTTLES DRAWN AEROBIC AND ANAEROBIC Blood Culture adequate volume Performed at [facility], [address] [place], [facility] [address]    Culture  Setup Time   Final    GRAM NEGATIVE RODS AEROBIC AND ANEROBIC BOTTLES Gram Stain Report Called to,Read Back By and Verified With: LARIMORE, A. 12/24 @ 0740 BEARD, S.  CRITICAL RESULT CALLED TO, READ BACK BY AND VERIFIED WITH: PHARMD  [place] [id number] FCP Performed at [facility], [address], [place], [facility] [address]    Culture ESCHERICHIA COLI (A)  Final   Report Status 08/03/2020 FINAL  Final   Organism ID, Bacteria ESCHERICHIA COLI  Final      Susceptibility   Escherichia coli - MIC*    AMPICILLIN <=2 SENSITIVE Sensitive     CEFAZOLIN <=4 SENSITIVE Sensitive     CEFEPIME <=0.12 SENSITIVE Sensitive     CEFTAZIDIME <=1 SENSITIVE Sensitive     CEFTRIAXONE <=0.25 SENSITIVE Sensitive     CIPROFLOXACIN <=0.25 SENSITIVE Sensitive     GENTAMICIN <=1 SENSITIVE Sensitive     IMIPENEM <=0.25 SENSITIVE Sensitive     TRIMETH/SULFA <=20 SENSITIVE Sensitive     AMPICILLIN/SULBACTAM <=2 SENSITIVE Sensitive     PIP/TAZO <=4  SENSITIVE Sensitive     * ESCHERICHIA COLI  Blood Culture ID Panel (Reflexed)     Status: Abnormal   Collection Time: 07/31/20  5:26 PM  Result Value Ref Range Status   Enterococcus faecalis NOT DETECTED NOT DETECTED Final  Enterococcus Faecium NOT DETECTED NOT DETECTED Final   Listeria monocytogenes NOT DETECTED NOT DETECTED Final   Staphylococcus species NOT DETECTED NOT DETECTED Final   Staphylococcus aureus (BCID) NOT DETECTED NOT DETECTED Final   Staphylococcus epidermidis NOT DETECTED NOT DETECTED Final   Staphylococcus lugdunensis NOT DETECTED NOT DETECTED Final   Streptococcus species NOT DETECTED NOT DETECTED Final   Streptococcus agalactiae NOT DETECTED NOT DETECTED Final   Streptococcus pneumoniae NOT DETECTED NOT DETECTED Final   Streptococcus pyogenes NOT DETECTED NOT DETECTED Final   A.calcoaceticus-baumannii NOT DETECTED NOT DETECTED Final   Bacteroides fragilis NOT DETECTED NOT DETECTED Final   Enterobacterales DETECTED (A) NOT DETECTED Final    Comment: Enterobacterales represent a large order of gram negative bacteria, not a single organism. CRITICAL RESULT CALLED TO, READ BACK BY AND VERIFIED WITH: PHARMD  TERRY G. [id number] 1921 FCP    Enterobacter cloacae complex NOT DETECTED NOT DETECTED Final   Escherichia coli DETECTED (A) NOT DETECTED Final    Comment: CRITICAL RESULT CALLED TO, READ BACK BY AND VERIFIED WITH: PHARMD  TERRY G. [id number] 1921 FCP    Klebsiella aerogenes NOT DETECTED NOT DETECTED Final   Klebsiella oxytoca NOT DETECTED NOT DETECTED Final   Klebsiella pneumoniae NOT DETECTED NOT DETECTED Final   Proteus species NOT DETECTED NOT DETECTED Final   Salmonella species NOT DETECTED NOT DETECTED Final   Serratia marcescens NOT DETECTED NOT DETECTED Final   Haemophilus influenzae NOT DETECTED NOT DETECTED Final   Neisseria meningitidis NOT DETECTED NOT DETECTED Final   Pseudomonas aeruginosa NOT DETECTED NOT DETECTED Final   Stenotrophomonas maltophilia  NOT DETECTED NOT DETECTED Final   Candida albicans NOT DETECTED NOT DETECTED Final   Candida auris NOT DETECTED NOT DETECTED Final   Candida glabrata NOT DETECTED NOT DETECTED Final   Candida krusei NOT DETECTED NOT DETECTED Final   Candida parapsilosis NOT DETECTED NOT DETECTED Final   Candida tropicalis NOT DETECTED NOT DETECTED Final   Cryptococcus neoformans/gattii NOT DETECTED NOT DETECTED Final   CTX-M ESBL NOT DETECTED NOT DETECTED Final   Carbapenem resistance IMP NOT DETECTED NOT DETECTED Final   Carbapenem resistance KPC NOT DETECTED NOT DETECTED Final   Carbapenem resistance NDM NOT DETECTED NOT DETECTED Final   Carbapenem resist OXA 48 LIKE NOT DETECTED NOT DETECTED Final   Carbapenem resistance VIM NOT DETECTED NOT DETECTED Final    Comment: Performed at [facility], [address], [place], [place] [address]  Resp Panel by RT-PCR (Flu A&B, Covid) Nasopharyngeal Swab     Status: None   Collection Time: 07/31/20  5:39 PM   Specimen: Nasopharyngeal Swab; Nasopharyngeal(NP) swabs in vial transport medium  Result Value Ref Range Status   SARS Coronavirus 2 by RT PCR NEGATIVE NEGATIVE Final    Comment: (NOTE) SARS-CoV-2 target nucleic acids are NOT DETECTED.  The SARS-CoV-2 RNA is generally detectable in upper respiratory specimens during the acute phase of infection. The lowest concentration of SARS-CoV-2 viral copies this assay can detect is 138 copies/mL. A negative result does not preclude SARS-Cov-2 infection and should not be used as the sole basis for treatment or other patient management decisions. A negative result may occur with  improper specimen collection/handling, submission of specimen other than nasopharyngeal swab, presence of viral mutation(s) within the areas targeted by this assay, and inadequate number of viral copies(<138 copies/mL). A negative result must be combined with clinical observations, patient history, and epidemiological information.  The expected result is Negative.  Fact Sheet for  Patients:  BloggerCourse.com  Fact Sheet for Healthcare Providers:  SeriousBroker.it  This test is no t yet approved or cleared by the [place] FDA and  has been authorized for detection and/or diagnosis of SARS-CoV-2 by FDA under an Emergency Use Authorization (EUA). This EUA will remain  in effect (meaning this test can be used) for the duration of the COVID-19 declaration under Section 564(b)(1) of the Act, 21 U.S.C.section 360bbb-3(b)(1), unless the authorization is terminated  or revoked sooner.       Influenza A by PCR NEGATIVE NEGATIVE Final   Influenza B by PCR NEGATIVE NEGATIVE Final    Comment: (NOTE) The Xpert Xpress SARS-CoV-2/FLU/RSV plus assay is intended as an aid in the diagnosis of influenza from Nasopharyngeal swab specimens and should not be used as a sole basis for treatment. Nasal washings and aspirates are unacceptable for Xpert Xpress SARS-CoV-2/FLU/RSV testing.  Fact Sheet for Patients: BloggerCourse.com  Fact Sheet for Healthcare Providers: SeriousBroker.it  This test is not yet approved or cleared by the [place] FDA and has been authorized for detection and/or diagnosis of SARS-CoV-2 by FDA under an Emergency Use Authorization (EUA). This EUA will remain in effect (meaning this test can be used) for the duration of the COVID-19 declaration under Section 564(b)(1) of the Act, 21 U.S.C. section 360bbb-3(b)(1), unless the authorization is terminated or revoked.  Performed at [facility], [address] [place], [place] [address]   Culture, blood (single)     Status: None   Collection Time: 07/31/20  8:23 PM   Specimen: BLOOD RIGHT WRIST  Result Value Ref Range Status   Specimen Description BLOOD RIGHT WRIST  Final   Special Requests   Final    BOTTLES DRAWN AEROBIC AND ANAEROBIC Blood  Culture results may not be optimal due to an excessive volume of blood received in culture bottles   Culture   Final    NO GROWTH 5 DAYS Performed at [facility], [address] [place], [place] [address]    Report Status 08/05/2020 FINAL  Final  Urine Culture     Status: Abnormal   Collection Time: 08/01/20 11:28 AM   Specimen: Urine, Clean Catch  Result Value Ref Range Status   Specimen Description   Final    URINE, CLEAN CATCH Performed at [facility], [address], [place], [place] [address]    Special Requests   Final    NONE Performed at [facility], [address] [place], [place] [address]    Culture (A)  Final    <10,000 COLONIES/mL INSIGNIFICANT GROWTH Performed at [facility], [address] [place], [place] [address]    Report Status 08/02/2020 FINAL  Final  MRSA PCR Screening     Status: None   Collection Time: 08/01/20  3:26 PM   Specimen: Nasopharyngeal  Result Value Ref Range Status   MRSA by PCR NEGATIVE NEGATIVE Final    Comment:        The GeneXpert MRSA Assay (FDA approved for NASAL specimens only), is one component of a comprehensive MRSA colonization surveillance program. It is not intended to diagnose MRSA infection nor to guide or monitor treatment for MRSA infections. Performed at [facility], [address] [place], [place] [address]   Aerobic/Anaerobic Culture (surgical/deep wound)     Status: None   Collection Time: 08/01/20  7:37 PM   Specimen: Urine, Catheterized  Result Value Ref Range Status   Specimen Description   Final  URINE, CATHETERIZED URINE FROM DRAIN Performed at [facility], [address] [place], [place] [address]    Special Requests   Final    NONE Performed at [facility], [address] [place], [place] [address]    Gram Stain   Final    ABUNDANT WBC PRESENT, PREDOMINANTLY PMN RARE GRAM NEGATIVE RODS RARE GRAM POSITIVE RODS    Culture    Final    FEW ESCHERICHIA COLI NO ANAEROBES ISOLATED Performed at [facility], [address] [place], [place] [address]    Report Status 08/07/2020 FINAL  Final   Organism ID, Bacteria ESCHERICHIA COLI  Final      Susceptibility   Escherichia coli - MIC*    AMPICILLIN 4 SENSITIVE Sensitive     CEFAZOLIN <=4 SENSITIVE Sensitive     CEFEPIME <=0.12 SENSITIVE Sensitive     CEFTRIAXONE <=0.25 SENSITIVE Sensitive     CIPROFLOXACIN <=0.25 SENSITIVE Sensitive     GENTAMICIN <=1 SENSITIVE Sensitive     IMIPENEM <=0.25 SENSITIVE Sensitive     NITROFURANTOIN <=16 SENSITIVE Sensitive     TRIMETH/SULFA <=20 SENSITIVE Sensitive     AMPICILLIN/SULBACTAM <=2 SENSITIVE Sensitive     PIP/TAZO <=4 SENSITIVE Sensitive     * FEW ESCHERICHIA COLI  Aerobic/Anaerobic Culture (surgical/deep wound)     Status: None   Collection Time: 08/01/20  7:38 PM   Specimen: Abscess  Result Value Ref Range Status   Specimen Description   Final    ABSCESS Performed at [facility], [address] [place], [place] [address]    Special Requests   Final    NONE Performed at [facility], [address] [place], [place] [address]    Gram Stain   Final    ABUNDANT WBC PRESENT, PREDOMINANTLY PMN MODERATE GRAM NEGATIVE RODS    Culture   Final    ABUNDANT ESCHERICHIA COLI NO ANAEROBES ISOLATED Performed at [facility], [address] [place], [place] [address]    Report Status 08/07/2020 FINAL  Final   Organism ID, Bacteria ESCHERICHIA COLI  Final      Susceptibility   Escherichia coli - MIC*    AMPICILLIN <=2 SENSITIVE Sensitive     CEFAZOLIN <=4 SENSITIVE Sensitive     CEFEPIME <=0.12 SENSITIVE Sensitive     CEFTAZIDIME <=1 SENSITIVE Sensitive     CEFTRIAXONE <=0.25 SENSITIVE Sensitive     CIPROFLOXACIN <=0.25 SENSITIVE Sensitive     GENTAMICIN <=1 SENSITIVE Sensitive     IMIPENEM <=0.25 SENSITIVE Sensitive     TRIMETH/SULFA <=20 SENSITIVE Sensitive      AMPICILLIN/SULBACTAM <=2 SENSITIVE Sensitive     PIP/TAZO <=4 SENSITIVE Sensitive     * ABUNDANT ESCHERICHIA COLI    Studies/Results: No results found.  Assessment: 1. Emphysematous pyelonephritis -  -WBC has normalized and clinically improving however perinephric drain still continues to put our purulent drainage and repeat CT showed larger perinephric abscess collection/hemorhage -Repeat CT abd/pelvis tomorrow with and without contrast -If abscess still large, would recommend another perinephric drain by IR to help recovery process and possibly salvage kidney  -discussed with patient and husband overall survival of kidney unclear at this point in time   2. UPJ calculus with mild obstructive uropathy and AKI - 4mm left prox ureteral stone by ER CT 12/24. Temporized with left neph tube. Cr peak 3's, now <1.03. Will reevalute presence on repeat CT scan tomorrow.   Kasandra KnudsenMaryEllen Lanora Reveron, MD  Urology 08/12/2020, 10:01 AM

## 2020-08-12 NOTE — Progress Notes (Signed)
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   Clinically doing better after PCD and appropriate IV abx with (resolved sepsis), improved leukocytosis. Never had fever this admission. Despite pulm involvement/pleural effusion, she is doing well from respiratory standpoint and is not requiring supplemental oxygen  Given the increased abscess size, however, she would benefit from repeat abd/pelv imaging this week. If the abscess continues to increase, would discuss case with urology in regard to management of percutaneous drainage  1/04 assessment Continues to do well without evidence of sepsis or respiratory compromise.  Will repeat abd/pelv/chest ct this thursday  PLAN: 1. Continue cefazolin 2 gram iv q8hours 2. Will order chest/abd/pelv ct with contrast on 08/14/2020 3. In the mean time if chest pain, fever chill, dyspnea, will need thoracentesis to r/o empyema  Principal Problem:   Severe sepsis (HCC) Active Problems:   Multifocal pneumonia   Hyperosmolar hyperglycemic state (HHS) (HCC)   Hyponatremia   Hypertension   Acute renal failure (HCC)   Hypokalemia    Severe protein-calorie malnutrition (HCC)   Class 1 obesity   Leukocytosis   Hypomagnesemia   Hyperphosphatemia   Abdominal distention   Lung nodules   Delirium   Perirenal abscess   E coli bacteremia   Acute pyelonephritis   Bibasilar crackles   Diabetic ketoacidosis without coma associated with type 2 diabetes mellitus (HCC)   Ileus (HCC)   Emphysematous pyelonephritis of left kidney   Scheduled Meds: . Chlorhexidine Gluconate Cloth  6 each Topical Daily  . feeding supplement (GLUCERNA SHAKE)  237 mL Oral BID BM  . heparin injection (subcutaneous)  5,000 Units Subcutaneous Q8H  . insulin aspart  0-20 Units Subcutaneous TID WC  . insulin aspart  0-5 Units Subcutaneous QHS  . insulin glargine  8 Units Subcutaneous BID  . potassium chloride  40 mEq Oral BID  . sodium chloride flush  10-40 mL Intracatheter Q12H  . sodium chloride flush  5 mL Intracatheter Q8H   Continuous Infusions: . sodium chloride 250 mL (08/11/20 2300)  .  ceFAZolin (ANCEF) IV 2 g (08/12/20 1515)  . sodium chloride     PRN Meds:.sodium chloride, acetaminophen, albuterol, dextrose, ondansetron **OR** ondansetron (ZOFRAN) IV, polyvinyl alcohol, sodium chloride flush   SUBJECTIVE: No fever, n/v/diarrhea Tolerating cefazolin iv Rue picc no irritation  Review of Systems: ROS Other ros negative  Allergies  Allergen Reactions  . Codeine Rash    OBJECTIVE: Vitals:   08/11/20 2036 08/12/20 0448 08/12/20 0500 08/12/20 1324  BP: (!) 142/83 139/82  134/74  Pulse: 95 86  89  Resp: 18 20  18   Temp: 99.3 F (37.4 C) 98 F (36.7 C)  98.6 F (37 C)  TempSrc: Oral Oral  Oral  SpO2: 96% 97%  94%  Weight:   78.6 kg   Height:       Body mass index is 31.69 kg/m.  Physical Exam Constitutional:      General: She is not in acute distress.    Appearance: She is well-developed.  HENT:     Head: Normocephalic.     Mouth/Throat:     Pharynx: No oropharyngeal exudate.  Eyes:     Extraocular  Movements: Extraocular movements intact.     Pupils: Pupils are equal, round, and reactive to light.  Cardiovascular:     Rate and Rhythm: Normal rate and regular rhythm.     Heart sounds: Normal heart sounds.  Pulmonary:     Effort: Pulmonary effort is normal.     Breath sounds: Normal breath sounds.  Abdominal:     General: There is no distension.     Palpations: Abdomen is soft.     Tenderness: There is no abdominal tenderness.  Skin:    General: Skin is warm.     Findings: No rash.     Comments: Left flank percutaneous nephrostomy tube site no discharge/tenderness/erythema  The catheter bag is draining thick serosanguinous fluid The neprhostomy bag is draining clear yellow urine  Neurological:     General: No focal deficit present.     Mental Status: She is alert.  Psychiatric:        Mood and Affect: Mood normal.     Lab Results Lab Results  Component Value Date   WBC 6.2 08/12/2020   HGB 8.7 (L) 08/12/2020   HCT 27.8 (L) 08/12/2020   MCV 96.5 08/12/2020   PLT 283 08/12/2020    Lab Results  Component Value Date   CREATININE 0.94 08/12/2020   BUN 19 08/12/2020   NA 140 08/12/2020   K 3.9 08/12/2020   CL 106 08/12/2020   CO2 25 08/12/2020    Lab Results  Component Value Date   ALT 11 08/10/2020   AST 22 08/10/2020   ALKPHOS 154 (H) 08/10/2020   BILITOT 0.5 08/10/2020     Microbiology: No results found for this or any previous visit (from the past 240 hour(s)).  Serology: 12/24 hiv screen negative  Imaging: 12/31 abd/pelv ct with contrast 1. Compared to the prior study there is been interval nephrostomy tube placement which appears appropriately located, as well as percutaneous drainage catheter placement in the inferior aspect of the patient's extensive perinephric abscess. Unfortunately, the overall size of the abscess appears increased compared to the prior examination, and there is new high attenuation fluid within the inferior aspect of this  collection, which could represent some recent hemorrhage. If there is concern for active hemorrhage, further evaluation with triple-phase CT of the abdomen and pelvis should be considered to evaluate for source of active bleeding. 2. Numerous large pulmonary nodules and masses in the lungs bilaterally, with imaging characteristics most compatible with extensive septic emboli with multifocal pulmonary abscess formation. 3. 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 This likely represents the source of emboli in this patient with evidence of septic embolization. 4. Moderate bilateral pleural effusions lying dependently with associated areas of passive atelectasis in the lower lobes of the lungs bilaterally. 5. Dilatation of the pulmonic trunk (3.6 cm in diameter), concerning for pulmonary arterial hypertension. 6. Additional incidental findings, as above.   12/24 abd/pelv ct with contrast Multiple BILATERAL pulmonary nodules up to 12 mm diameter, some of which appear ill-defined and slightly shaggy.  These  could represent metastatic foci or septic emboli.  Correlation with cardiology recommended to exclude source of septic embolism.  4 mm LEFT UPJ calculus with mild LEFT hydronephrosis and hydroureter.  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  Recommend annual imaging followup by CTA or MRA. This recommendation follows 2010 ACCF/AHA/AATS/ACR/ASA/SCA/SCAI/SIR/STS/SVM Guidelines for the Diagnosis and Management of Patients with Thoracic Aortic Disease. Circulation. 2010; 121: X726-O035. Aortic aneurysm NOS (ICD10-I71.9)  Aortic Atherosclerosis (ICD10-I70.0)  Jabier Mutton, [place] for  [place] [phone] pager    08/12/2020, 4:16 PM

## 2020-08-12 NOTE — Plan of Care (Signed)
Patient is ambulating in the room

## 2020-08-13 ENCOUNTER — Inpatient Hospital Stay (HOSPITAL_COMMUNITY): Payer: Self-pay

## 2020-08-13 ENCOUNTER — Encounter (HOSPITAL_COMMUNITY): Payer: Self-pay | Admitting: Internal Medicine

## 2020-08-13 DIAGNOSIS — Z978 Presence of other specified devices: Secondary | ICD-10-CM | POA: Diagnosis not present

## 2020-08-13 DIAGNOSIS — N151 Renal and perinephric abscess: Secondary | ICD-10-CM | POA: Diagnosis not present

## 2020-08-13 DIAGNOSIS — N10 Acute tubulo-interstitial nephritis: Secondary | ICD-10-CM | POA: Diagnosis not present

## 2020-08-13 DIAGNOSIS — J90 Pleural effusion, not elsewhere classified: Secondary | ICD-10-CM | POA: Diagnosis not present

## 2020-08-13 LAB — CBC
HCT: 26.2 % — ABNORMAL LOW (ref 36.0–46.0)
Hemoglobin: 8.3 g/dL — ABNORMAL LOW (ref 12.0–15.0)
MCH: 30.9 pg (ref 26.0–34.0)
MCHC: 31.7 g/dL (ref 30.0–36.0)
MCV: 97.4 fL (ref 80.0–100.0)
Platelets: 311 10*3/uL (ref 150–400)
RBC: 2.69 MIL/uL — ABNORMAL LOW (ref 3.87–5.11)
RDW: 15 % (ref 11.5–15.5)
WBC: 5.5 10*3/uL (ref 4.0–10.5)
nRBC: 0 % (ref 0.0–0.2)

## 2020-08-13 LAB — BASIC METABOLIC PANEL
Anion gap: 9 (ref 5–15)
BUN: 20 mg/dL (ref 6–20)
CO2: 24 mmol/L (ref 22–32)
Calcium: 8.1 mg/dL — ABNORMAL LOW (ref 8.9–10.3)
Chloride: 106 mmol/L (ref 98–111)
Creatinine, Ser: 0.84 mg/dL (ref 0.44–1.00)
GFR, Estimated: >60 mL/min (ref >60)
Glucose, Bld: 115 mg/dL — ABNORMAL HIGH (ref 70–99)
Potassium: 4.2 mmol/L (ref 3.5–5.1)
Sodium: 139 mmol/L (ref 135–145)

## 2020-08-13 LAB — GLUCOSE, CAPILLARY
Glucose-Capillary: 74 mg/dL (ref 70–99)
Glucose-Capillary: 74 mg/dL (ref 70–99)
Glucose-Capillary: 101 mg/dL — ABNORMAL HIGH (ref 70–99)
Glucose-Capillary: 202 mg/dL — ABNORMAL HIGH (ref 70–99)
Glucose-Capillary: 97 mg/dL (ref 70–99)
Glucose-Capillary: 129 mg/dL — ABNORMAL HIGH (ref 70–99)

## 2020-08-13 MED ORDER — IOHEXOL 300 MG/ML  SOLN
100.0000 mL | Freq: Once | INTRAMUSCULAR | Status: AC | PRN
  Administered 2020-08-13: 100 mL via INTRAVENOUS

## 2020-08-13 NOTE — Progress Notes (Signed)
[facility]  Date of Admission:  07/31/2020      Lines: 1/01-c RUE picc  Abx: 12/27-c cefazolin  12/23-27 ceftriaxone  ASSESSMENT: 60 yo female obese, dm2 admitted 12/23 for severe sepsis with aki, HHS, leukemoid reaction, found to have acute left emphysematous pyelonephritis with left renal vein thrombus, perirenal abscess, bilateral pleural effusion/pulm abscess-nodules, complicated by ecoli bacteremia   Patient is s/p IR percutaneous drainage catheter placement hiv serology negative 12/24 left perirenal abscess fluid ecoli 12/24 ucx 10k insignificant growth 12/23 bcx ecoli (pan sensitive)  Repeat abd/pelv ct with contrast 12/31 showed increased size perirenal abscess along with the moderate bilateral pleural effusion and bilateral pulm abscess/nodules (thought to be from left renal vein thrombotic septic emboli).   Clinically doing better after PCD and appropriate IV abx with (resolved sepsis), improved leukocytosis. Never had fever this admission. Despite pulm involvement/pleural effusion, she is doing well from respiratory standpoint and is not requiring supplemental oxygen  Given the increased abscess size, however, she would benefit from repeat abd/pelv imaging this week. If the abscess continues to increase, would discuss case with urology in regard to management of percutaneous drainage  1/05 assessment Continues to do well without evidence of sepsis or respiratory compromise. However, repeat ct scan 1/05 showed increased size of perirenal abscess. Minimal bilateral pleural effusion  PLAN: 1. Continue cefazolin 2 gram iv q8hours 2. Await surgery/urology team input on the perirenal abscess management, suspect might need more perc drain catheter placement  Principal Problem:   Severe sepsis ([place]) Active Problems:   Multifocal pneumonia   Hyperosmolar hyperglycemic state (HHS) ([place])   Hyponatremia   Hypertension   Acute renal  failure (HCC)   Hypokalemia   Severe protein-calorie malnutrition (HCC)   Class 1 obesity   Leukocytosis   Hypomagnesemia   Hyperphosphatemia   Abdominal distention   Lung nodules   Delirium   Perirenal abscess   E coli bacteremia   Acute pyelonephritis   Bibasilar crackles   Diabetic ketoacidosis without coma associated with type 2 diabetes mellitus ([place])   Ileus (HCC)   Emphysematous pyelonephritis of left kidney   Pleural effusion   Scheduled Meds: . Chlorhexidine Gluconate Cloth  6 each Topical Daily  . feeding supplement (GLUCERNA SHAKE)  237 mL Oral BID BM  . heparin injection (subcutaneous)  5,000 Units Subcutaneous Q8H  . insulin aspart  0-20 Units Subcutaneous TID WC  . insulin aspart  0-5 Units Subcutaneous QHS  . insulin glargine  8 Units Subcutaneous BID  . potassium chloride  40 mEq Oral BID  . sodium chloride flush  10-40 mL Intracatheter Q12H  . sodium chloride flush  5 mL Intracatheter Q8H   Continuous Infusions: . sodium chloride 250 mL (08/12/20 2301)  .  ceFAZolin (ANCEF) IV 2 g (08/13/20 0552)  . sodium chloride     PRN Meds:.sodium chloride, acetaminophen, albuterol, dextrose, ondansetron **OR** ondansetron (ZOFRAN) IV, polyvinyl alcohol, sodium chloride flush   SUBJECTIVE: No complaint Ct scan abd/pelv done this morning showed increased abscess size No n/v/diarrhea/chest pain/sob/rash  Review of Systems: ROS Other ros negative  Allergies  Allergen Reactions  . Codeine Rash    OBJECTIVE: Vitals:   08/12/20 0500 08/12/20 1324 08/12/20 2118 08/13/20 0428  BP:  134/74 134/71 (!) 144/77  Pulse:  89 97 89  Resp:  18  16  Temp:  98.6 F (37 C) 98.5 F (36.9 C) 98.4 F (36.9 C)  TempSrc:  Oral Oral Oral  SpO2:  94% 97% 97%  Weight: 78.6 kg   80.1 kg  Height:       Body mass index is 32.3 kg/m.  Physical Exam Constitutional:      General: She is not in acute distress.    Appearance: She is well-developed.     Comments: Overall  unchanged exam from yesterday  HENT:     Head: Normocephalic.     Mouth/Throat:     Pharynx: No oropharyngeal exudate.  Eyes:     Extraocular Movements: Extraocular movements intact.     Pupils: Pupils are equal, round, and reactive to light.  Cardiovascular:     Rate and Rhythm: Normal rate and regular rhythm.     Heart sounds: Normal heart sounds.  Pulmonary:     Effort: Pulmonary effort is normal.     Breath sounds: Normal breath sounds.  Abdominal:     General: There is no distension.     Palpations: Abdomen is soft.     Tenderness: There is no abdominal tenderness.  Skin:    General: Skin is warm.     Findings: No rash.     Comments: Left flank percutaneous nephrostomy tube site no discharge/tenderness/erythema  The catheter bag is draining thick serosanguinous fluid The neprhostomy bag is draining clear yellow urine  Neurological:     General: No focal deficit present.     Mental Status: She is alert.  Psychiatric:        Mood and Affect: Mood normal.     Lab Results Lab Results  Component Value Date   WBC 5.5 08/13/2020   HGB 8.3 (L) 08/13/2020   HCT 26.2 (L) 08/13/2020   MCV 97.4 08/13/2020   PLT 311 08/13/2020    Lab Results  Component Value Date   CREATININE 0.84 08/13/2020   BUN 20 08/13/2020   NA 139 08/13/2020   K 4.2 08/13/2020   CL 106 08/13/2020   CO2 24 08/13/2020    Lab Results  Component Value Date   ALT 11 08/10/2020   AST 22 08/10/2020   ALKPHOS 154 (H) 08/10/2020   BILITOT 0.5 08/10/2020     Microbiology: No results found for this or any previous visit (from the past 240 hour(s)).  Serology: 12/24 hiv screen negative  Imaging: 01/015 abd/pelv ct with contrast I personally reviewed images; it does appear perirenal fluid-gas collection increased in size; stable/improving small pleural effusion bilaterally  Left percutaneous nephrostomy tube in appropriate position. No evidence of hydronephrosis.  Stable diffuse left renal  parenchymal swelling with multiple small internal fluid density collections.  Mild increase in size of left perinephric fluid and gas collection.  No significant change in size of fluid and gas collections along the anterior aspect of left psoas muscle, and in the left lateral pararenal space.  Stable small bilateral pleural effusions and posterior lower lobe atelectasis or consolidation.  Stable small right lower lung pulmonary nodules.    12/31 abd/pelv ct with contrast 1. Compared to the prior study there is been interval nephrostomy tube placement which appears appropriately located, as well as percutaneous drainage catheter placement in the inferior aspect of the patient's extensive perinephric abscess. Unfortunately, the overall size of the abscess appears increased compared to the prior examination, and there is new high attenuation fluid within the inferior aspect of this collection, which could represent some recent hemorrhage. If there is concern for active hemorrhage, further evaluation with triple-phase CT of the abdomen and pelvis should  be considered to evaluate for source of active bleeding. 2. Numerous large pulmonary nodules and masses in the lungs bilaterally, with imaging characteristics most compatible with extensive septic emboli with multifocal pulmonary abscess formation. 3. 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 This likely represents the source of emboli in this patient with evidence of septic embolization. 4. Moderate bilateral pleural effusions lying dependently with associated areas of passive atelectasis in the lower lobes of the lungs bilaterally. 5. Dilatation of the pulmonic trunk (3.6 cm in diameter), concerning for pulmonary arterial hypertension. 6. Additional incidental findings, as above.   12/24 abd/pelv ct with contrast Multiple BILATERAL  pulmonary nodules up to 12 mm diameter, some of which appear ill-defined and slightly shaggy.  These could represent metastatic foci or septic emboli.  Correlation with cardiology recommended to exclude source of septic embolism.  4 mm LEFT UPJ calculus with mild LEFT hydronephrosis and hydroureter.  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  Recommend annual imaging followup by CTA or MRA. This recommendation follows 2010 ACCF/AHA/AATS/ACR/ASA/SCA/SCAI/SIR/STS/SVM Guidelines for the Diagnosis and Management of Patients with Thoracic Aortic Disease. Circulation. 2010; 121: B449-Q759. Aortic aneurysm NOS (ICD10-I71.9)  Aortic Atherosclerosis (ICD10-I70.0)  Raymondo Band, MD [facility] [phone] pager    08/13/2020, 11:01 AM

## 2020-08-13 NOTE — Progress Notes (Signed)
Progress Note    Tammie [name]  NIO:[id number] DOB: [birth year]  DOA: 07/31/2020 PCP: Patient, No Pcp Per      Brief Narrative:    Medical records reviewed and are as summarized below:  Tammie [name] is a 60 y.o. female with medical history significant for hypertension, obesity, type II DM, who was admitted 12/24 with chief complaint of abdominal pain.  She was found to have acute pyelonephritis with left renal abscess, severe sepsis, acute kidney injury, hyper osmolar nonketotic hyperglycemia (initial blood sugar in the  than 700s), severe hyponatremia. In the ED: CT imaging showed multiple pulmonary nodules, imaging of abdomen pelvis With left hydronephrosis and evidence of perinephritic abscess.  She was transferred to vascular hospital for placement of left nephrostomy tube and left pelvic abscess drain.  She was initially admitted to the ICU where she was given 3% hypertonic saline infusion for severe hyponatremia.  She was also treated with empiric IV antibiotics for severe sepsis secondary to left pyelonephritis, left renal abscess and pelvic abscess.    Assessment/Plan:   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 with type 2 diabetes mellitus (HCC)   Ileus (HCC)   Emphysematous pyelonephritis of left kidney   Pleural effusion   Nutrition Problem: Food and nutrition related knowledge deficit Etiology: limited prior education  Signs/Symptoms: per patient/family report (New DM)   Body mass index is 32.3 kg/m.   (Obesity)      Severe sepsis with E. coli bacteremia/left emphysematous pyelonephritis with perinephric abscess and retroperitoneal abscess -status post left nephrostomy andleftretroperitoneal/pelvic abscess drain placements12/24/21, IR following -Was on Rocephin now   Antibiotics have been deescalated to IV Ancef, right upper extremity PICC line placed on January 1, ID following - repeat ct scan 1/05 showed increased size of perirenal abscess, case discussed with urology and interventional radiology, per IR patient may need nephrectomy, will follow urology recommendation -WBC normalized, no fever, currently denies pain   Numerous pulmonary nodules, Suspect septic emboli, but cannot rule out malignancy - respiratory status stable, denies chest pain, denies short of breath, not on oxygen - nonocclusive thrombus forming in the left renal vein likely represents source of emboli.   - As patient clinically improving urology recommending to continue current regimen of IV antibiotics,  - ID following  - Per pulmonary patient will need repeat CT chest done in approximately 6 weeks.   -Bibasilar crackles/moderate bilateral pleural effusion She had received IV Lasix on 08/09/2020 and 08/10/2020.Marland Kitchen   AKI without known history of CKD - BUN 112/creatinine 4.19 on presentation, BUN 20/creatinine 0.84 today - Likely multifactorial secondary to emphysematous pyelonephritis with abscess, ischemic ATN in the setting of volume depletion.   - Status post percutaneous nephrostomy and perirenal abscess drainage.   - Foley catheter in place.  - Urology following.   Questionable ileus, resolving - Abdominal films obtained concerning for possible ileus.  CT abdomen and pelvis obtained negative for ileus. - Continue to advance diet as tolerated -Encourage ambulation, having BM  Hypokalemia/hypomagnesemia  Replaced, improved, recheck in the morning  Newly diagnosed diabetes mellitus type  2/HHNK Hemoglobin A1c noted at 12.2.   Continue Lantus 8 units twice daily.  Sliding scale insulin.  Blood sugars have been stable. Diabetic coordinator following.  A.m. glucose 115  Severe hyponatremia, resolved Sodium less than 102 on presentation Etiology is unclear.  Initially required treatment with hypertonic saline in the ICU. Sodium has since normalized      Diet Order            Diet Carb Modified Fluid consistency: Thin; Room service appropriate? Yes  Diet effective now                    Consultants:  Hydrographic surveyor  Interventional radiologist  General surgeon  Nephrologist  Infectious disease  Procedures:  CT-guided placement of left nephrostomy tube and left pelvic abscess drain on 08/01/2020  PICC line placement on January 1    Medications:   . Chlorhexidine Gluconate Cloth  6 each Topical Daily  . feeding supplement (GLUCERNA SHAKE)  237 mL Oral BID BM  . heparin injection (subcutaneous)  5,000 Units Subcutaneous Q8H  . insulin aspart  0-20 Units Subcutaneous TID WC  . insulin aspart  0-5 Units Subcutaneous QHS  . insulin glargine  8 Units Subcutaneous BID  . potassium chloride  40 mEq Oral BID  . sodium chloride flush  10-40 mL Intracatheter Q12H  . sodium chloride flush  5 mL Intracatheter Q8H   Continuous Infusions: . sodium chloride 250 mL (08/12/20 2301)  .  ceFAZolin (ANCEF) IV 2 g (08/13/20 0552)  . sodium chloride       Anti-infectives (From admission, onward)   Start     Dose/Rate Route Frequency Ordered Stop   08/05/20 1000  ceFAZolin (ANCEF) IVPB 2g/100 mL premix        2 g 200 mL/hr over 30 Minutes Intravenous Every 8 hours 08/05/20 0903     08/04/20 2200  ceFAZolin (ANCEF) IVPB 2g/100 mL premix  Status:  Discontinued        2 g 200 mL/hr over 30 Minutes Intravenous Every 12 hours 08/04/20 1131 08/05/20 0903   08/02/20 0000  piperacillin-tazobactam (ZOSYN) IVPB 3.375 g  Status:  Discontinued        3.375  g 12.5 mL/hr over 240 Minutes Intravenous Every 12 hours 08/01/20 1403 08/01/20 2006   08/01/20 2100  cefTRIAXone (ROCEPHIN) 2 g in sodium chloride 0.9 % 100 mL IVPB  Status:  Discontinued        2 g 200 mL/hr over 30 Minutes Intravenous Every 24 hours 08/01/20 2006 08/04/20 1131   08/01/20 1430  piperacillin-tazobactam (ZOSYN) IVPB 3.375 g        3.375 g 100 mL/hr over 30 Minutes Intravenous  Once 08/01/20 1355 08/01/20 1502   07/31/20 2000  cefTRIAXone (ROCEPHIN) 2 g in sodium chloride 0.9 % 100 mL IVPB  Status:  Discontinued        2 g 200 mL/hr over 30 Minutes Intravenous Every 24 hours 07/31/20 1947 08/01/20 1351   07/31/20 2000  azithromycin (ZITHROMAX) 500 mg in sodium chloride 0.9 % 250 mL IVPB  Status:  Discontinued        500 mg 250 mL/hr over 60 Minutes Intravenous Every 24 hours 07/31/20 1947 08/01/20 1351             Family Communication/Anticipated D/C date and plan/Code Status   DVT prophylaxis: heparin injection 5,000 Units Start: 08/05/20 1500  Code Status: Full Code  Family Communication: discussed with husband at the bedside Disposition Plan:    Status is: Inpatient  Remains inpatient appropriate because:IV treatments appropriate due to intensity of illness or inability to take PO and Inpatient level of care appropriate due to severity of illness   Dispo: The patient is from: Home              Anticipated d/c is to: Home              Anticipated d/c date is: > 3 days              Patient currently is not medically stable to d/c.     Subjective:   No abdominal pain, nausea or vomiting. Patient is seen together with IR in room Husband at bedside  Objective:    Vitals:   08/12/20 0500 08/12/20 1324 08/12/20 2118 08/13/20 0428  BP:  134/74 134/71 (!) 144/77  Pulse:  89 97 89  Resp:  18  16  Temp:  98.6 F (37 C) 98.5 F (36.9 C) 98.4 F (36.9 C)  TempSrc:  Oral Oral Oral  SpO2:  94% 97% 97%  Weight: 78.6 kg   80.1 kg  Height:        No data found.   Intake/Output Summary (Last 24 hours) at 08/13/2020 1221 Last data filed at 08/13/2020 0500 Gross per 24 hour  Intake 700.42 ml  Output 3010 ml  Net -2309.58 ml   Filed Weights   08/11/20 0500 08/12/20 0500 08/13/20 0428  Weight: 80.7 kg 78.6 kg 80.1 kg    Exam:  General exam: Alert, awake, oriented x 3, + two drains on the left, +indwelling foley Respiratory system: Diminished breath basis. Respiratory effort normal. Cardiovascular system:RRR. No murmurs, rubs, gallops. Gastrointestinal system: Abdomen is nondistended, soft and nontender.  Normal bowel sounds heard. Left nephrostomy in place, left pelvic drain in place Central nervous system: Alert and oriented. No focal neurological deficits. Extremities: No C/C/E, +pedal pulses Skin: No rashes, lesions or ulcers Psychiatry: Judgement and insight appear normal. Mood & affect appropriate.    Data Reviewed:   I have personally reviewed following labs and imaging studies:  Labs: Labs show the following:   Basic Metabolic Panel: Recent Labs  Lab 08/08/20 0502 08/08/20 0823 08/09/20 0528 08/10/20 0522 08/11/20 1156 08/12/20 0321 08/13/20 0302  NA  --  138 137 139 140 140 139  K  --  3.1* 3.6 3.0* 3.6 3.9 4.2  CL  --  97* 100 103 104 106 106  CO2  --  30 28 27 25 25 24   GLUCOSE  --  80 126* 132* 193* 127* 115*  BUN  --  32* 24* 20 16 19 20   CREATININE  --  0.98 1.03* 0.91 0.92 0.94 0.84  CALCIUM  --  8.1* 8.0* 8.0* 8.1* 8.2* 8.1*  MG 2.0  --  1.6* 2.1 1.3* 1.7  --   PHOS  --  2.8 2.9 2.9  --   --   --    GFR Estimated Creatinine Clearance: 70.7 mL/min (by C-G formula based on SCr of 0.84 mg/dL). Liver Function Tests: Recent Labs  Lab 08/08/20 0823 08/09/20 0528 08/10/20 0522  AST  --  22 22  ALT  --  8 11  ALKPHOS  --  165* 154*  BILITOT  --  0.7 0.5  PROT  --  5.1* 5.2*  ALBUMIN 2.0* 2.0* 2.2*   No results for input(s):  LIPASE, AMYLASE in the last 168 hours. No results for input(s):  AMMONIA in the last 168 hours. Coagulation profile No results for input(s): INR, PROTIME in the last 168 hours.  CBC: Recent Labs  Lab 08/08/20 0823 08/09/20 0528 08/10/20 0522 08/11/20 1156 08/12/20 0321 08/13/20 0302  WBC 11.1* 10.6* 10.0 6.9 6.2 5.5  NEUTROABS 9.1* 8.9* 8.1* 5.0 4.4  --   HGB 9.9* 9.0* 9.0* 8.7* 8.7* 8.3*  HCT 30.8* 28.0* 28.3* 27.4* 27.8* 26.2*  MCV 94.5 96.2 94.6 95.1 96.5 97.4  PLT 327 297 296 268 283 311   Cardiac Enzymes: No results for input(s): CKTOTAL, CKMB, CKMBINDEX, TROPONINI in the last 168 hours. BNP (last 3 results) No results for input(s): PROBNP in the last 8760 hours. CBG: Recent Labs  Lab 08/12/20 1256 08/12/20 1636 08/12/20 2011 08/13/20 0735 08/13/20 1130  GLUCAP 96 123* 134* 101* 202*   D-Dimer: No results for input(s): DDIMER in the last 72 hours. Hgb A1c: No results for input(s): HGBA1C in the last 72 hours. Lipid Profile: No results for input(s): CHOL, HDL, LDLCALC, TRIG, CHOLHDL, LDLDIRECT in the last 72 hours. Thyroid function studies: No results for input(s): TSH, T4TOTAL, T3FREE, THYROIDAB in the last 72 hours.  Invalid input(s): FREET3 Anemia work up: No results for input(s): VITAMINB12, FOLATE, FERRITIN, TIBC, IRON, RETICCTPCT in the last 72 hours. Sepsis Labs: Recent Labs  Lab 08/10/20 0522 08/11/20 1156 08/12/20 0321 08/13/20 0302  WBC 10.0 6.9 6.2 5.5    Microbiology No results found for this or any previous visit (from the past 240 hour(s)).  Procedures and diagnostic studies:  CT ABDOMEN PELVIS W WO CONTRAST  Result Date: 08/13/2020 CLINICAL DATA:  Complicated pyelonephritis.  Abscess. EXAM: CT ABDOMEN AND PELVIS WITHOUT AND WITH CONTRAST TECHNIQUE: Multidetector CT imaging of the abdomen and pelvis was performed following the standard protocol before and following the bolus administration of intravenous contrast. CONTRAST:  OMNIPAQUE IOHEXOL 300 MG/ML  SOLN COMPARISON:  08/08/2020 FINDINGS:  Lower Chest: Small bilateral pleural effusions are again seen with atelectasis or consolidation in the posterior lower lobes. Multiple small pulmonary nodules are again seen in the visualized portion of the right lower lung, without significant change. Hepatobiliary: No hepatic masses identified. Gallbladder is unremarkable. No evidence of biliary ductal dilatation. Pancreas:  No mass or inflammatory changes. Spleen: Within normal limits in size and appearance. Adrenals/Urinary Tract: A left percutaneous nephrostomy tube is seen in appropriate position and there is no evidence of hydronephrosis. Diffuse left renal swelling is again seen with multiple small irregular fluid density collections throughout the renal parenchyma, and a perinephric fluid and gas collection is again seen along the lateral and posterior margins of the kidney. This measures 9.4 x 4.3 cm on image 29/7, increased from 7.4 x 2.5 cm when remeasured in same planes on prior exam. A 2nd percutaneous drainage catheter is seen in the left lower quadrant along the anterior aspect of the left psoas muscle. Left retroperitoneal fluid collection in this region measures 8.7 x 4.4 cm on image 54/2, without significant change. A 3rd fluid and gas collection is seen in the left pararenal space which measures 6.7 x 2.7 cm on image 46/7, without significant change in size since prior exam. Foley catheter is noted within the urinary bladder. Stomach/Bowel: No evidence of obstruction, inflammatory process or abnormal fluid collections. Vascular/Lymphatic: No pathologically enlarged lymph nodes. No abdominal aortic aneurysm. Reproductive:  No mass or other significant abnormality. Other:  None. Musculoskeletal:  No suspicious bone lesions  identified. IMPRESSION: Left percutaneous nephrostomy tube in appropriate position. No evidence of hydronephrosis. Stable diffuse left renal parenchymal swelling with multiple small internal fluid density collections. Mild  increase in size of left perinephric fluid and gas collection. No significant change in size of fluid and gas collections along the anterior aspect of left psoas muscle, and in the left lateral pararenal space. Stable small bilateral pleural effusions and posterior lower lobe atelectasis or consolidation. Stable small right lower lung pulmonary nodules. Electronically Signed   By: Marlaine Hind M.D.   On: 08/13/2020 08:27       LOS: 13 days   [facility]   Pager on [email or web address]. If 7PM-7AM, please contact night-coverage at [email or web address]     08/13/2020, 12:21 PM

## 2020-08-13 NOTE — Plan of Care (Signed)
Pt, ambulating in the hallways

## 2020-08-13 NOTE — Progress Notes (Signed)
PT Cancellation Note  Patient Details Name: Tammie [name] MRN: [id number] DOB: [birth year]   Cancelled Treatment:    Reason Eval/Treat Not Completed: PT will sign off--pt was observed walking in hallway with husband. Recommend continued ambulation with family or nursing as tolerated.    Faye Ramsay, PT Acute Rehabilitation  Office: [phone] Pager: [phone]

## 2020-08-13 NOTE — Progress Notes (Signed)
Referring Physician(s): Pace,M  Supervising Physician: Irish Lack  Patient Status:  [facility] - In-pt  Chief Complaint: Left emphysematous pyelonephritis/retroperitoneal abscess   Subjective: Pt doing ok this am; denies fevers, worsening flank pain,N/V; has ambulated   Allergies: Codeine  Medications: Prior to Admission medications   Medication Sig Start Date End Date Taking? Authorizing Provider  acetaminophen (TYLENOL) 500 MG tablet Take 1,000 mg by mouth every 6 (six) hours as needed.   Yes [provider]  Pseudoeph-Doxylamine-DM-APAP (NYQUIL PO) Take 1 tablet by mouth daily as needed (congestion).   Yes [provider]     Vital Signs: BP (!) 144/77 (BP Location: Left Arm)   Pulse 89   Temp 98.4 F (36.9 C) (Oral)   Resp 16   Ht 5\' 2"  (1.575 m)   Wt 176 lb 9.4 oz (80.1 kg)   LMP  (LMP Unknown)   SpO2 97%   BMI 32.30 kg/m   Physical Exam left PCN/RP drains intact, insertion sites ok; minimal tenderness; OP left PCN-310 cc yellow urine; left RP drain- 375 cc purulent appearing reddish- beige fluid  Imaging: CT ABDOMEN PELVIS W WO CONTRAST  Result Date: 08/13/2020 CLINICAL DATA:  Complicated pyelonephritis.  Abscess. EXAM: CT ABDOMEN AND PELVIS WITHOUT AND WITH CONTRAST TECHNIQUE: Multidetector CT imaging of the abdomen and pelvis was performed following the standard protocol before and following the bolus administration of intravenous contrast. CONTRAST:  10/11/2020 OMNIPAQUE IOHEXOL 300 MG/ML  SOLN COMPARISON:  08/08/2020 FINDINGS: Lower Chest: Small bilateral pleural effusions are again seen with atelectasis or consolidation in the posterior lower lobes. Multiple small pulmonary nodules are again seen in the visualized portion of the right lower lung, without significant change. Hepatobiliary: No hepatic masses identified. Gallbladder is unremarkable. No evidence of biliary ductal dilatation. Pancreas:  No mass or inflammatory changes. Spleen: Within  normal limits in size and appearance. Adrenals/Urinary Tract: A left percutaneous nephrostomy tube is seen in appropriate position and there is no evidence of hydronephrosis. Diffuse left renal swelling is again seen with multiple small irregular fluid density collections throughout the renal parenchyma, and a perinephric fluid and gas collection is again seen along the lateral and posterior margins of the kidney. This measures 9.4 x 4.3 cm on image 29/7, increased from 7.4 x 2.5 cm when remeasured in same planes on prior exam. A 2nd percutaneous drainage catheter is seen in the left lower quadrant along the anterior aspect of the left psoas muscle. Left retroperitoneal fluid collection in this region measures 8.7 x 4.4 cm on image 54/2, without significant change. A 3rd fluid and gas collection is seen in the left pararenal space which measures 6.7 x 2.7 cm on image 46/7, without significant change in size since prior exam. Foley catheter is noted within the urinary bladder. Stomach/Bowel: No evidence of obstruction, inflammatory process or abnormal fluid collections. Vascular/Lymphatic: No pathologically enlarged lymph nodes. No abdominal aortic aneurysm. Reproductive:  No mass or other significant abnormality. Other:  None. Musculoskeletal:  No suspicious bone lesions identified. IMPRESSION: Left percutaneous nephrostomy tube in appropriate position. No evidence of hydronephrosis. Stable diffuse left renal parenchymal swelling with multiple small internal fluid density collections. Mild increase in size of left perinephric fluid and gas collection. No significant change in size of fluid and gas collections along the anterior aspect of left psoas muscle, and in the left lateral pararenal space. Stable small bilateral pleural effusions and posterior lower lobe atelectasis or consolidation. Stable small right lower lung pulmonary nodules. Electronically Signed  By: Danae Orleans M.D.   On: 08/13/2020 08:27     Labs:  CBC: Recent Labs    08/10/20 0522 08/11/20 1156 08/12/20 0321 08/13/20 0302  WBC 10.0 6.9 6.2 5.5  HGB 9.0* 8.7* 8.7* 8.3*  HCT 28.3* 27.4* 27.8* 26.2*  PLT 296 268 283 311    COAGS: No results for input(s): INR, APTT in the last 8760 hours.  BMP: Recent Labs    08/10/20 0522 08/11/20 1156 08/12/20 0321 08/13/20 0302  NA 139 140 140 139  K 3.0* 3.6 3.9 4.2  CL 103 104 106 106  CO2 27 25 25 24   GLUCOSE 132* 193* 127* 115*  BUN 20 16 19 20   CALCIUM 8.0* 8.1* 8.2* 8.1*  CREATININE 0.91 0.92 0.94 0.84  GFRNONAA >60 >60 >60 >60    LIVER FUNCTION TESTS: Recent Labs    07/31/20 1913 08/01/20 0641 08/08/20 0823 08/09/20 0528 08/10/20 0522  BILITOT 1.3* 0.9  --  0.7 0.5  AST 14* 19  --  22 22  ALT 16 19  --  8 11  ALKPHOS 147* 142*  --  165* 154*  PROT 5.4* 5.4*  --  5.1* 5.2*  ALBUMIN 2.0* 2.1* 2.0* 2.0* 2.2*    Assessment and Plan: Patient with history of left emphysematous pyelonephritis with associated retroperitoneal abscess, status post left nephrostomy and left retroperitoneal/pelvic abscess drain placements 08/01/20; afebrile, prev cultures with E. Coli; WBC nl; hgb 8.3, creat nl; CT A/P today revealed:   Left percutaneous nephrostomy tube in appropriate position. No evidence of hydronephrosis.  Stable diffuse left renal parenchymal swelling with multiple small internal fluid density collections.  Mild increase in size of left perinephric fluid and gas collection.  No significant change in size of fluid and gas collections along the anterior aspect of left psoas muscle, and in the left lateral pararenal space.  Stable small bilateral pleural effusions and posterior lower lobe atelectasis or consolidation.  Stable small right lower lung pulmonary nodules   Images were reviewed by Dr. 10/08/20; cont current tx; no additional IR intervention needed at this time; await urology f/u         Electronically Signed: D. 08/03/20, PA-C 08/13/2020, 11:01 AM   I spent a total of 15 minutes at the the patient's bedside AND on the patient's hospital floor or unit, greater than 50% of which was counseling/coordinating care for left nephrostomy/left retroperitoneal abscess drain    Patient ID: Tammie [name], female   DOB: [birth year], 61 y.o.   MRN: [id number]/[id number]

## 2020-08-13 NOTE — Progress Notes (Addendum)
Repeat CT of the abdomen pelvis was reviewed today.  It shows continued left renal swelling with multiple fluid collections with gas consistent with emphysematous pyelonephritis.  Patient has left PCN tube going to collecting system.  She also has perinephric drain inferiorly into abscess.  There is a increase in size of the left perinephric fluid and gas collection that appears to be separate and slightly proximal to the inferior collection.  I will discussed the case with interventional radiology and see if this second collection is amenable to another PCN drain.  Would avoid proceeding with nephrectomy in the acute setting as there is significant infection and would be associated with significant morbidity and mortality.  Patient is also clinically improving and does not have acute indication to proceed to OR.  Continue antibiotics.  Update: Spoke to Dr. Loreta Ave with interventional radiology who reviewed films.  He thinks the fluid collection is amenable to another percutaneous drain.  We will plan to keep the existing drains as well as at the third drain in the fluid collection not communicating tomorrow.  Patient to be n.p.o. at midnight and heparin to be held.

## 2020-08-14 ENCOUNTER — Encounter (HOSPITAL_COMMUNITY): Payer: Self-pay | Admitting: Internal Medicine

## 2020-08-14 ENCOUNTER — Inpatient Hospital Stay (HOSPITAL_COMMUNITY): Payer: Self-pay

## 2020-08-14 DIAGNOSIS — Z978 Presence of other specified devices: Secondary | ICD-10-CM | POA: Diagnosis not present

## 2020-08-14 DIAGNOSIS — N151 Renal and perinephric abscess: Secondary | ICD-10-CM | POA: Diagnosis not present

## 2020-08-14 DIAGNOSIS — N10 Acute tubulo-interstitial nephritis: Secondary | ICD-10-CM | POA: Diagnosis not present

## 2020-08-14 DIAGNOSIS — Z936 Other artificial openings of urinary tract status: Secondary | ICD-10-CM | POA: Diagnosis not present

## 2020-08-14 LAB — GLUCOSE, CAPILLARY
Glucose-Capillary: 104 mg/dL — ABNORMAL HIGH (ref 70–99)
Glucose-Capillary: 93 mg/dL (ref 70–99)
Glucose-Capillary: 133 mg/dL — ABNORMAL HIGH (ref 70–99)
Glucose-Capillary: 116 mg/dL — ABNORMAL HIGH (ref 70–99)

## 2020-08-14 LAB — BASIC METABOLIC PANEL
Anion gap: 9 (ref 5–15)
BUN: 17 mg/dL (ref 6–20)
CO2: 23 mmol/L (ref 22–32)
Calcium: 8.4 mg/dL — ABNORMAL LOW (ref 8.9–10.3)
Chloride: 110 mmol/L (ref 98–111)
Creatinine, Ser: 0.94 mg/dL (ref 0.44–1.00)
GFR, Estimated: >60 mL/min (ref >60)
Glucose, Bld: 113 mg/dL — ABNORMAL HIGH (ref 70–99)
Potassium: 4.2 mmol/L (ref 3.5–5.1)
Sodium: 142 mmol/L (ref 135–145)

## 2020-08-14 LAB — MAGNESIUM: Magnesium: 1.3 mg/dL — ABNORMAL LOW (ref 1.7–2.4)

## 2020-08-14 LAB — PROTIME-INR
INR: 1 (ref 0.8–1.2)
Prothrombin Time: 13.1 seconds (ref 11.4–15.2)

## 2020-08-14 MED ORDER — MIDAZOLAM HCL 2 MG/2ML IJ SOLN
INTRAMUSCULAR | Status: AC
  Filled 2020-08-14: qty 4

## 2020-08-14 MED ORDER — MAGNESIUM SULFATE 4 GM/100ML IV SOLN
4.0000 g | Freq: Once | INTRAVENOUS | Status: AC
  Administered 2020-08-14: 4 g via INTRAVENOUS
  Filled 2020-08-14: qty 100

## 2020-08-14 MED ORDER — SODIUM CHLORIDE 0.9% FLUSH
5.0000 mL | Freq: Three times a day (TID) | INTRAVENOUS | Status: DC
  Administered 2020-08-14 – 2020-08-19 (×15): 5 mL

## 2020-08-14 MED ORDER — FENTANYL CITRATE (PF) 100 MCG/2ML IJ SOLN
INTRAMUSCULAR | Status: DC | PRN
  Administered 2020-08-14 (×2): 50 ug via INTRAVENOUS

## 2020-08-14 MED ORDER — FENTANYL CITRATE (PF) 100 MCG/2ML IJ SOLN
INTRAMUSCULAR | Status: AC
  Filled 2020-08-14: qty 2

## 2020-08-14 MED ORDER — MIDAZOLAM HCL 2 MG/2ML IJ SOLN
INTRAMUSCULAR | Status: DC | PRN
  Administered 2020-08-14 (×2): 1 mg via INTRAVENOUS

## 2020-08-14 NOTE — Plan of Care (Signed)
°  Problem: Activity: °Goal: Risk for activity intolerance will decrease °Outcome: Progressing °  °Problem: Elimination: °Goal: Will not experience complications related to urinary retention °Outcome: Progressing °  °Problem: Pain Managment: °Goal: General experience of comfort will improve °Outcome: Progressing °  °Problem: Safety: °Goal: Ability to remain free from injury will improve °Outcome: Progressing °  °

## 2020-08-14 NOTE — Progress Notes (Signed)
Progress Note    Jennye Runquist  CVE:[id number] DOB: [birth year]  DOA: 07/31/2020 PCP: Patient, No Pcp Per      Brief Narrative:    Medical records reviewed and are as summarized below:  Tammie [name] is a 60 y.o. female with medical history significant for hypertension, obesity, type II DM, who was admitted 12/24 with chief complaint of abdominal pain.  She was found to have acute pyelonephritis with left renal abscess, severe sepsis, acute kidney injury, hyper osmolar nonketotic hyperglycemia (initial blood sugar in the  than 700s), severe hyponatremia. In the ED: CT imaging showed multiple pulmonary nodules, imaging of abdomen pelvis With left hydronephrosis and evidence of perinephritic abscess.  She was transferred to vascular hospital for placement of left nephrostomy tube and left pelvic abscess drain.  She was initially admitted to the ICU where she was given 3% hypertonic saline infusion for severe hyponatremia.  She was also treated with empiric IV antibiotics for severe sepsis secondary to left pyelonephritis, left renal abscess and pelvic abscess.    Assessment/Plan:   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 with type 2 diabetes mellitus (HCC)   Ileus (HCC)   Emphysematous pyelonephritis of left kidney   Pleural effusion   Nutrition Problem: Food and nutrition related knowledge deficit Etiology: limited prior education  Signs/Symptoms: per patient/family report (New DM)   Body mass index is 32.3 kg/m.   (Obesity)      Severe sepsis with E. coli bacteremia/left emphysematous pyelonephritis with perinephric abscess and retroperitoneal abscess -status post left nephrostomy andleftretroperitoneal/pelvic abscess drain placements12/24/21, IR following -Was on Rocephin now   Antibiotics have been deescalated to IV Ancef, right upper extremity PICC line placed on January 1, ID following - repeat ct scan 1/05 showed increased size of perirenal abscess, case discussed with urology and interventional radiology, -urology and IR discussed the case plan to proceed with third drain placement today, -Will follow IR and urology recommendation -WBC normalized, no fever, currently denies pain   Numerous pulmonary nodules, Suspect septic emboli, but cannot rule out malignancy - respiratory status stable, denies chest pain, denies short of breath, not on oxygen - nonocclusive thrombus forming in the left renal vein likely represents source of emboli.   - As patient clinically improving urology recommending to continue current regimen of IV antibiotics,  - ID following  - Per pulmonary patient will need repeat CT chest done in approximately 6 weeks.   -Bibasilar crackles/moderate bilateral pleural effusion She had received IV Lasix on 08/09/2020 and 08/10/2020.Marland Kitchen   AKI without known history of CKD/anemia of chronic disease - BUN 112/creatinine 4.19 on presentation, BUN 20/creatinine 0.84 today - Likely multifactorial secondary to emphysematous pyelonephritis with abscess, ischemic ATN in the setting of volume depletion.   - Status post percutaneous nephrostomy and perirenal abscess drainage.   - Foley catheter in place.  - Urology following.     Questionable ileus, resolving - Abdominal films obtained concerning for possible ileus.  CT abdomen and pelvis obtained negative  for ileus. - Continue to advance diet as tolerated -Encourage ambulation, having BM  Hypokalemia/hypomagnesemia K normalized,  mag 1.3 today, will give IV mag  Newly diagnosed diabetes mellitus type 2/HHNK Hemoglobin A1c noted at 12.2.   Continue Lantus 8 units twice daily.  Sliding scale insulin.  Blood sugars have been stable. Diabetic coordinator following.  A.m. glucose 115  Severe hyponatremia, resolved Sodium less than 102 on presentation Etiology is unclear.  Initially required treatment with hypertonic saline in the ICU. Sodium has since normalized  Body mass index is 32.3 kg/m.     Diet Order            Diet NPO time specified  Diet effective midnight                    Consultants:  Intensivist  Urologist  Interventional radiologist  General surgeon  Nephrologist  Infectious disease  Procedures:  CT-guided placement of left nephrostomy tube and left pelvic abscess drain on 08/01/2020  PICC line placement on January 1 CT guided left perinephric abscess drain placement on 1/6    Medications:   . Chlorhexidine Gluconate Cloth  6 each Topical Daily  . feeding supplement (GLUCERNA SHAKE)  237 mL Oral BID BM  . insulin aspart  0-20 Units Subcutaneous TID WC  . insulin aspart  0-5 Units Subcutaneous QHS  . insulin glargine  8 Units Subcutaneous BID  . potassium chloride  40 mEq Oral BID  . sodium chloride flush  5 mL Intracatheter Q8H   Continuous Infusions: . sodium chloride 250 mL (08/12/20 2301)  .  ceFAZolin (ANCEF) IV 2 g (08/14/20 0640)  . sodium chloride       Anti-infectives (From admission, onward)   Start     Dose/Rate Route Frequency Ordered Stop   08/05/20 1000  ceFAZolin (ANCEF) IVPB 2g/100 mL premix        2 g 200 mL/hr over 30 Minutes Intravenous Every 8 hours 08/05/20 0903     08/04/20 2200  ceFAZolin (ANCEF) IVPB 2g/100 mL premix  Status:  Discontinued        2 g 200 mL/hr over 30 Minutes Intravenous Every 12 hours 08/04/20 1131 08/05/20 0903   08/02/20 0000  piperacillin-tazobactam (ZOSYN) IVPB 3.375 g  Status:  Discontinued        3.375  g 12.5 mL/hr over 240 Minutes Intravenous Every 12 hours 08/01/20 1403 08/01/20 2006   08/01/20 2100  cefTRIAXone (ROCEPHIN) 2 g in sodium chloride 0.9 % 100 mL IVPB  Status:  Discontinued        2 g 200 mL/hr over 30 Minutes Intravenous Every 24 hours 08/01/20 2006 08/04/20 1131   08/01/20 1430  piperacillin-tazobactam (ZOSYN) IVPB 3.375 g        3.375 g 100 mL/hr over 30 Minutes Intravenous  Once 08/01/20 1355 08/01/20 1502   07/31/20 2000  cefTRIAXone (ROCEPHIN) 2 g in sodium chloride 0.9 % 100 mL IVPB  Status:  Discontinued        2 g 200 mL/hr over 30 Minutes Intravenous Every 24 hours 07/31/20 1947 08/01/20 1351   07/31/20 2000  azithromycin (ZITHROMAX) 500 mg in sodium chloride 0.9 % 250 mL IVPB  Status:  Discontinued        500 mg 250 mL/hr over 60 Minutes Intravenous Every 24 hours 07/31/20 1947 08/01/20 1351             Family Communication/Anticipated D/C date and plan/Code Status   DVT prophylaxis:  Code Status: Full Code  Family Communication: discussed with husband at the bedside Disposition Plan:    Status is: Inpatient  Remains inpatient appropriate because:IV treatments appropriate due to intensity of illness or inability to take PO and Inpatient level of care appropriate due to severity of illness    Dispo: The patient is from: Home              Anticipated d/c is to: Home              Anticipated d/c date is: To be determined, need urology and ID clearance                Subjective:   No abdominal pain, nausea or vomiting.  Husband at bedside  Objective:    Vitals:   08/13/20 0428 08/13/20 1236 08/13/20 2154 08/14/20 0553  BP: (!) 144/77 (!) 144/75 (!) 142/84 139/74  Pulse: 89 92 96 88  Resp: 16 (!) 25 (!) 22 20  Temp: 98.4 F (36.9 C) 98.2 F (36.8 C) 98.3 F (36.8 C) 98 F (36.7 C)  TempSrc: Oral Oral Oral Oral  SpO2: 97% 97% 96% 95%  Weight: 80.1 kg     Height:       No data found.   Intake/Output Summary (Last 24  hours) at 08/14/2020 0811 Last data filed at 08/14/2020 0640 Gross per 24 hour  Intake 917.88 ml  Output 3800 ml  Net -2882.12 ml   Filed Weights   08/11/20 0500 08/12/20 0500 08/13/20 0428  Weight: 80.7 kg 78.6 kg 80.1 kg    Exam:  General exam: Alert, awake, oriented x 3, + drains on the left, +indwelling foley Respiratory system: Diminished breath basis. Respiratory effort normal. Cardiovascular system:RRR. No murmurs, rubs, gallops. Gastrointestinal system: Abdomen is nondistended, soft and nontender.  Normal bowel sounds heard. Left nephrostomy in place, left pelvic drain in place Central nervous system: Alert and oriented. No focal neurological deficits. Extremities: No C/C/E, +pedal pulses Skin: No rashes, lesions or ulcers Psychiatry: Judgement and insight appear normal. Mood & affect appropriate.    Data Reviewed:   I have personally reviewed following labs and imaging studies:  Labs: Labs show the following:   Basic Metabolic Panel: Recent Labs  Lab 08/08/20 0823 08/09/20 0528 08/10/20 0522 08/11/20 1156 08/12/20 0321 08/13/20 0302 08/14/20 0327  NA 138 137 139 140 140 139 142  K 3.1* 3.6 3.0* 3.6 3.9 4.2 4.2  CL 97* 100 103 104 106 106 110  CO2 30 28 27 25 25 24 23   GLUCOSE 80 126* 132* 193* 127* 115* 113*  BUN 32* 24* 20 16 19 20 17   CREATININE 0.98 1.03* 0.91 0.92 0.94 0.84 0.94  CALCIUM 8.1* 8.0* 8.0* 8.1* 8.2* 8.1* 8.4*  MG  --  1.6* 2.1 1.3* 1.7  --  1.3*  PHOS 2.8 2.9 2.9  --   --   --   --    GFR Estimated Creatinine Clearance: 63.2 mL/min (by C-G formula based on SCr of 0.94 mg/dL). Liver Function Tests: Recent Labs  Lab 08/08/20 0823 08/09/20 0528 08/10/20 0522  AST  --  22 22  ALT  --  8 11  ALKPHOS  --  165* 154*  BILITOT  --  0.7 0.5  PROT  --  5.1* 5.2*  ALBUMIN 2.0* 2.0* 2.2*   No results for input(s): LIPASE, AMYLASE in the last 168 hours. No results for input(s): AMMONIA in the last 168 hours. Coagulation profile Recent Labs  Lab 08/14/20 0327  INR 1.0    CBC: Recent Labs  Lab 08/08/20 0823 08/09/20 0528 08/10/20 0522 08/11/20 1156 08/12/20 0321 08/13/20 0302  WBC 11.1* 10.6* 10.0 6.9 6.2 5.5  NEUTROABS 9.1* 8.9* 8.1* 5.0 4.4  --   HGB 9.9* 9.0* 9.0* 8.7* 8.7* 8.3*  HCT 30.8* 28.0* 28.3* 27.4* 27.8* 26.2*  MCV 94.5 96.2 94.6 95.1 96.5 97.4  PLT 327 297 296 268 283 311   Cardiac Enzymes: No results for input(s): CKTOTAL, CKMB, CKMBINDEX, TROPONINI in the last 168 hours. BNP (last 3 results) No results for input(s): PROBNP in the last 8760 hours. CBG: Recent Labs  Lab 08/13/20 0735 08/13/20 1130 08/13/20 1556 08/13/20 2132 08/14/20 0756  GLUCAP 101* 202* 97 129* 104*   D-Dimer: No results for input(s): DDIMER in the last 72 hours. Hgb A1c: No results for input(s): HGBA1C in the last 72 hours. Lipid Profile: No results for input(s): CHOL, HDL, LDLCALC, TRIG, CHOLHDL, LDLDIRECT in the last 72 hours. Thyroid function studies: No results for input(s): TSH, T4TOTAL, T3FREE, THYROIDAB in the last 72 hours.  Invalid input(s): FREET3 Anemia work up: No results for input(s): VITAMINB12, FOLATE, FERRITIN, TIBC, IRON, RETICCTPCT in the last 72 hours. Sepsis Labs: Recent Labs  Lab 08/10/20 0522 08/11/20 1156 08/12/20 0321 08/13/20 0302  WBC 10.0 6.9 6.2 5.5    Microbiology No results found for this or any previous visit (from the past 240 hour(s)).  Procedures and diagnostic studies:  CT ABDOMEN PELVIS W WO CONTRAST  Result Date: 08/13/2020 CLINICAL DATA:  Complicated pyelonephritis.  Abscess. EXAM: CT ABDOMEN AND PELVIS WITHOUT AND WITH CONTRAST TECHNIQUE: Multidetector CT imaging of the abdomen and pelvis was performed following the standard protocol before and following the bolus administration of intravenous contrast. CONTRAST:  OMNIPAQUE IOHEXOL 300 MG/ML  SOLN COMPARISON:  08/08/2020 FINDINGS: Lower Chest: Small bilateral pleural effusions are again seen with atelectasis or  consolidation in the posterior lower lobes. Multiple small pulmonary nodules are again seen in the visualized portion of the right lower lung, without significant change. Hepatobiliary: No hepatic masses identified. Gallbladder is unremarkable. No evidence of biliary ductal dilatation. Pancreas:  No mass or inflammatory changes. Spleen: Within normal limits in size and appearance. Adrenals/Urinary Tract: A left percutaneous nephrostomy tube is seen in appropriate position and there is no evidence of hydronephrosis. Diffuse left renal swelling is again seen with multiple small irregular fluid density collections throughout the renal parenchyma, and a perinephric fluid and gas collection is again seen along the lateral and posterior margins of the kidney. This measures 9.4 x 4.3 cm on image 29/7, increased from 7.4 x 2.5 cm when remeasured in same planes on prior exam. A 2nd percutaneous drainage catheter is seen in the left lower quadrant along the anterior aspect of the left psoas muscle. Left retroperitoneal fluid collection in this region measures 8.7 x 4.4 cm on image 54/2, without significant change. A 3rd fluid and gas collection is seen in the left pararenal space which measures 6.7 x 2.7 cm on image 46/7, without significant change in size since prior exam. Foley catheter is noted within the urinary bladder. Stomach/Bowel: No evidence of obstruction, inflammatory process or abnormal fluid collections. Vascular/Lymphatic: No pathologically enlarged lymph nodes. No abdominal aortic aneurysm. Reproductive:  No mass or other significant abnormality. Other:  None. Musculoskeletal:  No suspicious bone lesions identified. IMPRESSION: Left percutaneous nephrostomy tube in appropriate position. No evidence of hydronephrosis. Stable diffuse left renal parenchymal swelling with multiple small internal  fluid density collections. Mild increase in size of left perinephric fluid and gas collection. No significant change in  size of fluid and gas collections along the anterior aspect of left psoas muscle, and in the left lateral pararenal space. Stable small bilateral pleural effusions and posterior lower lobe atelectasis or consolidation. Stable small right lower lung pulmonary nodules. Electronically Signed   By: Danae Orleans M.D.   On: 08/13/2020 08:27       LOS: 14 days   Albertine Grates  [facility]   Pager on [email or web address]. If 7PM-7AM, please contact night-coverage at [email or web address]     08/14/2020, 8:11 AM

## 2020-08-14 NOTE — Progress Notes (Signed)
Urology Inpatient Progress Report          Intv/Subj: No acute events overnight.  Patient is without complaint. Repeat CT showed worsening fluid/gas collection surrounding kidney.   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 with type 2 diabetes mellitus (HCC)   Ileus (HCC)   Emphysematous pyelonephritis of left kidney   Pleural effusion  Current Facility-Administered Medications  Medication Dose Route Frequency Provider Last Rate Last Admin   0.9 %  sodium chloride infusion   Intravenous PRN Lurene Shadow, MD 10 mL/hr at 08/12/20 2301 250 mL at 08/12/20 2301   acetaminophen (TYLENOL) tablet 650 mg  650 mg Oral Q4H PRN Rodolph Bong, MD       albuterol (PROVENTIL) (2.5 MG/3ML) 0.083% nebulizer solution 2.5 mg  2.5 mg Nebulization Q4H PRN Simonne Martinet, NP       ceFAZolin (ANCEF) IVPB 2g/100 mL premix  2 g Intravenous Q8H Simonne Martinet, NP 200 mL/hr at 08/14/20 0640 2 g at 08/14/20 0640   Chlorhexidine Gluconate Cloth 2 % PADS 6 each  6 each Topical Daily Simonne Martinet, NP   6 each at 08/13/20 1331   dextrose 50 % solution 0-50 mL  0-50 mL Intravenous PRN Simonne Martinet, NP       feeding supplement (GLUCERNA SHAKE) (GLUCERNA SHAKE) liquid 237 mL  237 mL Oral BID BM Rodolph Bong, MD   237 mL at 08/13/20 0908   insulin aspart (novoLOG) injection 0-20 Units  0-20 Units Subcutaneous TID WC Simonne Martinet, NP   7 Units at 08/13/20 1201   insulin aspart (novoLOG) injection 0-5 Units  0-5 Units  Subcutaneous QHS Simonne Martinet, NP       insulin glargine (LANTUS) injection 8 Units  8 Units Subcutaneous BID Rodolph Bong, MD   8 Units at 08/13/20 2134   ondansetron (ZOFRAN) tablet 4 mg  4 mg Oral Q6H PRN Simonne Martinet, NP       Or   ondansetron (ZOFRAN) injection 4 mg  4 mg Intravenous Q6H PRN Simonne Martinet, NP       polyvinyl alcohol (LIQUIFILM TEARS) 1.4 % ophthalmic solution 1 drop  1 drop Both Eyes PRN Rodolph Bong, MD       potassium chloride SA (KLOR-CON) CR tablet 40 mEq  40 mEq Oral BID Vann, Jessica U, DO   40 mEq at 08/13/20 2133   sodium chloride 0.9 % bolus 1,588 mL  20 mL/kg Intravenous Once Simonne Martinet, NP       sodium chloride flush (NS) 0.9 % injection 10-40 mL  10-40 mL Intracatheter PRN Kasandra Knudsen D, MD   10 mL at 08/13/20 1724   sodium chloride flush (NS) 0.9 % injection 5 mL  5 mL Intracatheter Q8H Simonne Martinet, NP   5 mL at 08/14/20 0640     Objective: Vital: Vitals:   08/13/20 0428 08/13/20 1236 08/13/20 2154 08/14/20 0553  BP: (!) 144/77 (!) 144/75 (!) 142/84  139/74  Pulse: 89 92 96 88  Resp: 16 (!) 25 (!) 22 20  Temp: 98.4 F (36.9 C) 98.2 F (36.8 C) 98.3 F (36.8 C) 98 F (36.7 C)  TempSrc: Oral Oral Oral Oral  SpO2: 97% 97% 96% 95%  Weight: 80.1 kg     Height:       I/Os: I/O last 3 completed shifts: In: 1128.3 [I.V.:498.3; Other:40; IV Piggyback:590] Out: 4098 [[id number] [Urine:5185; Drains:450]  Physical Exam:  General: Patient is in no apparent distress Lungs: Normal respiratory effort, chest expands symmetrically. GI: The abdomen is soft and nontender  L PCN tube: draining yellow urine with debris L perinephric drain: draining blood and purulent drainage Foley: draining clear yellow urine Ext: lower extremities symmetric  Lab Results: Recent Labs    08/11/20 1156 08/12/20 0321 08/13/20 0302  WBC 6.9 6.2 5.5  HGB 8.7* 8.7* 8.3*  HCT 27.4* 27.8* 26.2*   Recent Labs    08/12/20 0321 08/13/20 0302  08/14/20 0327  NA 140 139 142  K 3.9 4.2 4.2  CL 106 106 110  CO2 25 24 23   GLUCOSE 127* 115* 113*  BUN 19 20 17   CREATININE 0.94 0.84 0.94  CALCIUM 8.2* 8.1* 8.4*   Recent Labs    08/14/20 0327  INR 1.0   No results for input(s): LABURIN in the last 72 hours. Results for orders placed or performed during the hospital encounter of 07/31/20  Culture, blood (single)     Status: Abnormal   Collection Time: 07/31/20  5:26 PM   Specimen: BLOOD  Result Value Ref Range Status   Specimen Description   Final    BLOOD LEFT ANTECUBITAL Performed at [facility], [address] [place], [place] [address]    Special Requests   Final    BOTTLES DRAWN AEROBIC AND ANAEROBIC Blood Culture adequate volume Performed at [facility], [address] [place], [place] [address]    Culture  Setup Time   Final    GRAM NEGATIVE RODS AEROBIC AND ANEROBIC BOTTLES Gram Stain Report Called to,Read Back By and Verified With: LARIMORE, A. 12/24 @ 0740 BEARD, S.  CRITICAL RESULT CALLED TO, READ BACK BY AND VERIFIED WITH: PHARMD  Lucita LoraERRY G. [id number] [id number] FCP Performed at [facility], [address] [place], [place] [address]    Culture ESCHERICHIA COLI (A)  Final   Report Status 08/03/2020 FINAL  Final   Organism ID, Bacteria ESCHERICHIA COLI  Final      Susceptibility   Escherichia coli - MIC*    AMPICILLIN <=2 SENSITIVE Sensitive     CEFAZOLIN <=4 SENSITIVE Sensitive     CEFEPIME <=0.12 SENSITIVE Sensitive     CEFTAZIDIME <=1 SENSITIVE Sensitive     CEFTRIAXONE <=0.25 SENSITIVE Sensitive     CIPROFLOXACIN <=0.25 SENSITIVE Sensitive     GENTAMICIN <=1 SENSITIVE Sensitive     IMIPENEM <=0.25 SENSITIVE Sensitive     TRIMETH/SULFA <=20 SENSITIVE Sensitive     AMPICILLIN/SULBACTAM <=2 SENSITIVE Sensitive     PIP/TAZO <=4 SENSITIVE Sensitive     * ESCHERICHIA COLI  Blood Culture ID Panel (Reflexed)     Status: Abnormal   Collection Time: 07/31/20  5:26 PM  Result Value Ref Range Status    Enterococcus faecalis NOT DETECTED NOT DETECTED Final   Enterococcus Faecium NOT DETECTED NOT DETECTED Final   Listeria monocytogenes NOT DETECTED NOT DETECTED Final   Staphylococcus species NOT DETECTED NOT DETECTED Final   Staphylococcus aureus (BCID) NOT DETECTED NOT  DETECTED Final   Staphylococcus epidermidis NOT DETECTED NOT DETECTED Final   Staphylococcus lugdunensis NOT DETECTED NOT DETECTED Final   Streptococcus species NOT DETECTED NOT DETECTED Final   Streptococcus agalactiae NOT DETECTED NOT DETECTED Final   Streptococcus pneumoniae NOT DETECTED NOT DETECTED Final   Streptococcus pyogenes NOT DETECTED NOT DETECTED Final   A.calcoaceticus-baumannii NOT DETECTED NOT DETECTED Final   Bacteroides fragilis NOT DETECTED NOT DETECTED Final   Enterobacterales DETECTED (A) NOT DETECTED Final    Comment: Enterobacterales represent a large order of gram negative bacteria, not a single organism. CRITICAL RESULT CALLED TO, READ BACK BY AND VERIFIED WITH: PHARMD  TERRY G. [id number] FCP    Enterobacter cloacae complex NOT DETECTED NOT DETECTED Final   Escherichia coli DETECTED (A) NOT DETECTED Final    Comment: CRITICAL RESULT CALLED TO, READ BACK BY AND VERIFIED WITH: PHARMD  TERRY G. [id number] 1921 FCP    Klebsiella aerogenes NOT DETECTED NOT DETECTED Final   Klebsiella oxytoca NOT DETECTED NOT DETECTED Final   Klebsiella pneumoniae NOT DETECTED NOT DETECTED Final   Proteus species NOT DETECTED NOT DETECTED Final   Salmonella species NOT DETECTED NOT DETECTED Final   Serratia marcescens NOT DETECTED NOT DETECTED Final   Haemophilus influenzae NOT DETECTED NOT DETECTED Final   Neisseria meningitidis NOT DETECTED NOT DETECTED Final   Pseudomonas aeruginosa NOT DETECTED NOT DETECTED Final   Stenotrophomonas maltophilia NOT DETECTED NOT DETECTED Final   Candida albicans NOT DETECTED NOT DETECTED Final   Candida auris NOT DETECTED NOT DETECTED Final   Candida glabrata NOT DETECTED NOT  DETECTED Final   Candida krusei NOT DETECTED NOT DETECTED Final   Candida parapsilosis NOT DETECTED NOT DETECTED Final   Candida tropicalis NOT DETECTED NOT DETECTED Final   Cryptococcus neoformans/gattii NOT DETECTED NOT DETECTED Final   CTX-M ESBL NOT DETECTED NOT DETECTED Final   Carbapenem resistance IMP NOT DETECTED NOT DETECTED Final   Carbapenem resistance KPC NOT DETECTED NOT DETECTED Final   Carbapenem resistance NDM NOT DETECTED NOT DETECTED Final   Carbapenem resist OXA 48 LIKE NOT DETECTED NOT DETECTED Final   Carbapenem resistance VIM NOT DETECTED NOT DETECTED Final    Comment: Performed at [facility], [address] [place], [place] [address]  Resp Panel by RT-PCR (Flu A&B, Covid) Nasopharyngeal Swab     Status: None   Collection Time: 07/31/20  5:39 PM   Specimen: Nasopharyngeal Swab; Nasopharyngeal(NP) swabs in vial transport medium  Result Value Ref Range Status   SARS Coronavirus 2 by RT PCR NEGATIVE NEGATIVE Final    Comment: (NOTE) SARS-CoV-2 target nucleic acids are NOT DETECTED.  The SARS-CoV-2 RNA is generally detectable in upper respiratory specimens during the acute phase of infection. The lowest concentration of SARS-CoV-2 viral copies this assay can detect is 138 copies/mL. A negative result does not preclude SARS-Cov-2 infection and should not be used as the sole basis for treatment or other patient management decisions. A negative result may occur with  improper specimen collection/handling, submission of specimen other than nasopharyngeal swab, presence of viral mutation(s) within the areas targeted by this assay, and inadequate number of viral copies(<138 copies/mL). A negative result must be combined with clinical observations, patient history, and epidemiological information. The expected result is Negative.  Fact Sheet for Patients:  BloggerCourse.com  Fact Sheet for Healthcare Providers:   SeriousBroker.it  This test is no t yet approved or cleared by the [place] FDA and  has been authorized for detection  and/or diagnosis of SARS-CoV-2 by FDA under an Emergency Use Authorization (EUA). This EUA will remain  in effect (meaning this test can be used) for the duration of the COVID-19 declaration under Section 564(b)(1) of the Act, 21 U.S.C.section 360bbb-3(b)(1), unless the authorization is terminated  or revoked sooner.       Influenza A by PCR NEGATIVE NEGATIVE Final   Influenza B by PCR NEGATIVE NEGATIVE Final    Comment: (NOTE) The Xpert Xpress SARS-CoV-2/FLU/RSV plus assay is intended as an aid in the diagnosis of influenza from Nasopharyngeal swab specimens and should not be used as a sole basis for treatment. Nasal washings and aspirates are unacceptable for Xpert Xpress SARS-CoV-2/FLU/RSV testing.  Fact Sheet for Patients: BloggerCourse.com  Fact Sheet for Healthcare Providers: SeriousBroker.it  This test is not yet approved or cleared by the [place] FDA and has been authorized for detection and/or diagnosis of SARS-CoV-2 by FDA under an Emergency Use Authorization (EUA). This EUA will remain in effect (meaning this test can be used) for the duration of the COVID-19 declaration under Section 564(b)(1) of the Act, 21 U.S.C. section 360bbb-3(b)(1), unless the authorization is terminated or revoked.  Performed at [facility], [address] [place], [place] [address]   Culture, blood (single)     Status: None   Collection Time: 07/31/20  8:23 PM   Specimen: BLOOD RIGHT WRIST  Result Value Ref Range Status   Specimen Description BLOOD RIGHT WRIST  Final   Special Requests   Final    BOTTLES DRAWN AEROBIC AND ANAEROBIC Blood Culture results may not be optimal due to an excessive volume of blood received in culture bottles   Culture   Final    NO GROWTH 5 DAYS Performed  at [facility], [address], [place], [place] [address]    Report Status 08/05/2020 FINAL  Final  Urine Culture     Status: Abnormal   Collection Time: 08/01/20 11:28 AM   Specimen: Urine, Clean Catch  Result Value Ref Range Status   Specimen Description   Final    URINE, CLEAN CATCH Performed at [facility], [address], [place], [place] [address]    Special Requests   Final    NONE Performed at [facility], [address], [place], [place] [address]    Culture (A)  Final    <10,000 COLONIES/mL INSIGNIFICANT GROWTH Performed at [facility], [address], [place], [place] [address]    Report Status 08/02/2020 FINAL  Final  MRSA PCR Screening     Status: None   Collection Time: 08/01/20  3:26 PM   Specimen: Nasopharyngeal  Result Value Ref Range Status   MRSA by PCR NEGATIVE NEGATIVE Final    Comment:        The GeneXpert MRSA Assay (FDA approved for NASAL specimens only), is one component of a comprehensive MRSA colonization surveillance program. It is not intended to diagnose MRSA infection nor to guide or monitor treatment for MRSA infections. Performed at [facility], [address] [place], [place] [address]   Aerobic/Anaerobic Culture (surgical/deep wound)     Status: None   Collection Time: 08/01/20  7:37 PM   Specimen: Urine, Catheterized  Result Value Ref Range Status   Specimen Description   Final    URINE, CATHETERIZED URINE FROM DRAIN Performed at [facility], [address] [place], [place] [address]    Special Requests   Final    NONE Performed at  [facility], [address] [place], [place] [address]    Gram Stain   Final    ABUNDANT WBC PRESENT, PREDOMINANTLY PMN RARE GRAM NEGATIVE RODS RARE GRAM POSITIVE RODS    Culture   Final    FEW ESCHERICHIA COLI NO ANAEROBES ISOLATED Performed at [facility], [address] [place], [place] [address]    Report Status  08/07/2020 FINAL  Final   Organism ID, Bacteria ESCHERICHIA COLI  Final      Susceptibility   Escherichia coli - MIC*    AMPICILLIN 4 SENSITIVE Sensitive     CEFAZOLIN <=4 SENSITIVE Sensitive     CEFEPIME <=0.12 SENSITIVE Sensitive     CEFTRIAXONE <=0.25 SENSITIVE Sensitive     CIPROFLOXACIN <=0.25 SENSITIVE Sensitive     GENTAMICIN <=1 SENSITIVE Sensitive     IMIPENEM <=0.25 SENSITIVE Sensitive     NITROFURANTOIN <=16 SENSITIVE Sensitive     TRIMETH/SULFA <=20 SENSITIVE Sensitive     AMPICILLIN/SULBACTAM <=2 SENSITIVE Sensitive     PIP/TAZO <=4 SENSITIVE Sensitive     * FEW ESCHERICHIA COLI  Aerobic/Anaerobic Culture (surgical/deep wound)     Status: None   Collection Time: 08/01/20  7:38 PM   Specimen: Abscess  Result Value Ref Range Status   Specimen Description   Final    ABSCESS Performed at [facility], [address] [place], [place] [address]    Special Requests   Final    NONE Performed at [facility], [address] [place], [place] [address]    Gram Stain   Final    ABUNDANT WBC PRESENT, PREDOMINANTLY PMN MODERATE GRAM NEGATIVE RODS    Culture   Final    ABUNDANT ESCHERICHIA COLI NO ANAEROBES ISOLATED Performed at [facility], [address], [place], [place] [address]    Report Status 08/07/2020 FINAL  Final   Organism ID, Bacteria ESCHERICHIA COLI  Final      Susceptibility   Escherichia coli - MIC*    AMPICILLIN <=2 SENSITIVE Sensitive     CEFAZOLIN <=4 SENSITIVE Sensitive     CEFEPIME <=0.12 SENSITIVE Sensitive     CEFTAZIDIME <=1 SENSITIVE Sensitive     CEFTRIAXONE <=0.25 SENSITIVE Sensitive     CIPROFLOXACIN <=0.25 SENSITIVE Sensitive     GENTAMICIN <=1 SENSITIVE Sensitive     IMIPENEM <=0.25 SENSITIVE Sensitive     TRIMETH/SULFA <=20 SENSITIVE Sensitive     AMPICILLIN/SULBACTAM <=2 SENSITIVE Sensitive     PIP/TAZO <=4 SENSITIVE Sensitive     * ABUNDANT ESCHERICHIA COLI    Studies/Results: CT  ABDOMEN PELVIS W WO CONTRAST  Result Date: 08/13/2020 CLINICAL DATA:  Complicated pyelonephritis.  Abscess. EXAM: CT ABDOMEN AND PELVIS WITHOUT AND WITH CONTRAST TECHNIQUE: Multidetector CT imaging of the abdomen and pelvis was performed following the standard protocol before and following the bolus administration of intravenous contrast. CONTRAST:  OMNIPAQUE IOHEXOL 300 MG/ML  SOLN COMPARISON:  08/08/2020 FINDINGS: Lower Chest: Small bilateral pleural effusions are again seen with atelectasis or consolidation in the posterior lower lobes. Multiple small pulmonary nodules are again seen in the visualized portion of the right lower lung, without significant change. Hepatobiliary: No hepatic masses identified. Gallbladder is unremarkable. No evidence of biliary ductal dilatation. Pancreas:  No mass or inflammatory changes. Spleen: Within normal limits in size and appearance. Adrenals/Urinary Tract: A left percutaneous nephrostomy tube is seen in appropriate position and there is no evidence of hydronephrosis. Diffuse left renal swelling is  again seen with multiple small irregular fluid density collections throughout the renal parenchyma, and a perinephric fluid and gas collection is again seen along the lateral and posterior margins of the kidney. This measures 9.4 x 4.3 cm on image 29/7, increased from 7.4 x 2.5 cm when remeasured in same planes on prior exam. A 2nd percutaneous drainage catheter is seen in the left lower quadrant along the anterior aspect of the left psoas muscle. Left retroperitoneal fluid collection in this region measures 8.7 x 4.4 cm on image 54/2, without significant change. A 3rd fluid and gas collection is seen in the left pararenal space which measures 6.7 x 2.7 cm on image 46/7, without significant change in size since prior exam. Foley catheter is noted within the urinary bladder. Stomach/Bowel: No evidence of obstruction, inflammatory process or abnormal fluid collections.  Vascular/Lymphatic: No pathologically enlarged lymph nodes. No abdominal aortic aneurysm. Reproductive:  No mass or other significant abnormality. Other:  None. Musculoskeletal:  No suspicious bone lesions identified. IMPRESSION: Left percutaneous nephrostomy tube in appropriate position. No evidence of hydronephrosis. Stable diffuse left renal parenchymal swelling with multiple small internal fluid density collections. Mild increase in size of left perinephric fluid and gas collection. No significant change in size of fluid and gas collections along the anterior aspect of left psoas muscle, and in the left lateral pararenal space. Stable small bilateral pleural effusions and posterior lower lobe atelectasis or consolidation. Stable small right lower lung pulmonary nodules. Electronically Signed   By: Marlaine Hind M.D.   On: 08/13/2020 08:27    Assessment: Assessment: 1. Emphysematous pyelonephritis -  -WBC has normalized and clinically improving however perinephric drain still continues to put our purulent drainage and repeat CT showed larger perinephric abscess collection/hemorhage -Repeat CT abd/pelvis continues to show undrained infection -plan for IR to place 3rd drain in largest fluid collection today -discussed with patient and husband overall survival of kidney unclear at this point in time  2. UPJ calculus with mild obstructive uropathy and AKI - 42mm left prox ureteral stone by ER CT 12/24. Temporized with left neph tube. Cr peak 3's, now <1.03.Will reevalute presence on repeat CT scan tomorrow.    Jacalyn Lefevre, MD Urology 08/14/2020, 7:56 AM

## 2020-08-14 NOTE — Progress Notes (Signed)
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   Clinically doing better after PCD/nephrostomy tube and appropriate IV abx with (resolved sepsis), improved leukocytosis. Never had fever this admission. Despite pulm involvement/pleural effusion, she is doing well from respiratory standpoint and is not requiring supplemental oxygen  1/06 assessment Continues to do well without evidence of sepsis or respiratory compromise. However, repeat ct scan 1/05 showed increased size of perirenal abscess. Minimal bilateral pleural effusion  Reviewed urology recommendations Await ir placement of another perinephric abscess drain. Potentially nephrectomy is on the table if continued worsening perinephric process  PLAN: 1. Continue cefazolin 2 gram iv q8hours 2. Worsening perinephric abscess; agree with IR placement of another percutaneous drain into perinephric abscess   Principal Problem:   Severe sepsis (HCC) Active Problems:   Multifocal pneumonia   Hyperosmolar hyperglycemic state (HHS) (HCC)   Hyponatremia   Hypertension   Acute renal failure (HCC)   Hypokalemia   Severe  protein-calorie malnutrition (HCC)   Class 1 obesity   Leukocytosis   Hypomagnesemia   Hyperphosphatemia   Abdominal distention   Lung nodules   Delirium   Perirenal abscess   E coli bacteremia   Acute pyelonephritis   Bibasilar crackles   Diabetic ketoacidosis without coma associated with type 2 diabetes mellitus (HCC)   Ileus (HCC)   Emphysematous pyelonephritis of left kidney   Pleural effusion   Scheduled Meds: . Chlorhexidine Gluconate Cloth  6 each Topical Daily  . feeding supplement (GLUCERNA SHAKE)  237 mL Oral BID BM  . insulin aspart  0-20 Units Subcutaneous TID WC  . insulin aspart  0-5 Units Subcutaneous QHS  . insulin glargine  8 Units Subcutaneous BID  . potassium chloride  40 mEq Oral BID  . sodium chloride flush  5 mL Intracatheter Q8H   Continuous Infusions: . sodium chloride 250 mL (08/12/20 2301)  .  ceFAZolin (ANCEF) IV 2 g (08/14/20 0640)  . sodium chloride     PRN Meds:.sodium chloride, acetaminophen, albuterol, dextrose, ondansetron **OR** ondansetron (ZOFRAN) IV, polyvinyl alcohol, sodium chloride flush   SUBJECTIVE: No complaint No n/v/diarrhea/chest pain/sob/rash  Review of Systems: ROS Other ros negative  Allergies  Allergen Reactions  . Codeine Rash    OBJECTIVE: Vitals:   08/13/20 0428 08/13/20 1236 08/13/20 2154 08/14/20 0553  BP: (!) 144/77 (!) 144/75 (!) 142/84 139/74  Pulse: 89 92 96 88  Resp: 16 (!) 25 (!) 22 20  Temp: 98.4 F (36.9 C) 98.2 F (36.8 C) 98.3 F (36.8 C) 98 F (36.7 C)  TempSrc: Oral Oral Oral Oral  SpO2: 97% 97% 96% 95%  Weight: 80.1 kg     Height:       Body mass index is 32.3 kg/m.  Physical Exam Constitutional:  General: She is not in acute distress.    Appearance: She is well-developed.     Comments: Overall unchanged exam from yesterday  HENT:     Head: Normocephalic.     Mouth/Throat:     Pharynx: No oropharyngeal exudate.  Eyes:     Extraocular Movements: Extraocular movements  intact.     Pupils: Pupils are equal, round, and reactive to light.  Cardiovascular:     Rate and Rhythm: Normal rate and regular rhythm.     Heart sounds: Normal heart sounds.  Pulmonary:     Effort: Pulmonary effort is normal.     Breath sounds: Normal breath sounds.  Abdominal:     General: There is no distension.     Palpations: Abdomen is soft.     Tenderness: There is no abdominal tenderness.  Skin:    General: Skin is warm.     Findings: No rash.     Comments: Left flank percutaneous nephrostomy tube site no discharge/tenderness/erythema  The catheter bag is draining thick serosanguinous fluid The neprhostomy bag is draining clear yellow urine  Neurological:     General: No focal deficit present.     Mental Status: She is alert.  Psychiatric:        Mood and Affect: Mood normal.     Lab Results Lab Results  Component Value Date   WBC 5.5 08/13/2020   HGB 8.3 (L) 08/13/2020   HCT 26.2 (L) 08/13/2020   MCV 97.4 08/13/2020   PLT 311 08/13/2020    Lab Results  Component Value Date   CREATININE 0.94 08/14/2020   BUN 17 08/14/2020   NA 142 08/14/2020   K 4.2 08/14/2020   CL 110 08/14/2020   CO2 23 08/14/2020    Lab Results  Component Value Date   ALT 11 08/10/2020   AST 22 08/10/2020   ALKPHOS 154 (H) 08/10/2020   BILITOT 0.5 08/10/2020     Microbiology: No results found for this or any previous visit (from the past 240 hour(s)).  Serology: 12/24 hiv screen negative  Imaging: 01/05 abd/pelv ct with contrast Left percutaneous nephrostomy tube in appropriate position. No evidence of hydronephrosis.  Stable diffuse left renal parenchymal swelling with multiple small internal fluid density collections.  Mild increase in size of left perinephric fluid and gas collection.  No significant change in size of fluid and gas collections along the anterior aspect of left psoas muscle, and in the left lateral pararenal space.  Stable small bilateral  pleural effusions and posterior lower lobe atelectasis or consolidation.  Stable small right lower lung pulmonary nodules.    12/31 abd/pelv ct with contrast 1. Compared to the prior study there is been interval nephrostomy tube placement which appears appropriately located, as well as percutaneous drainage catheter placement in the inferior aspect of the patient's extensive perinephric abscess. Unfortunately, the overall size of the abscess appears increased compared to the prior examination, and there is new high attenuation fluid within the inferior aspect of this collection, which could represent some recent hemorrhage. If there is concern for active hemorrhage, further evaluation with triple-phase CT of the abdomen and pelvis should be considered to evaluate for source of active bleeding. 2. Numerous large pulmonary nodules and masses in the lungs bilaterally, with imaging characteristics most compatible with extensive septic emboli with multifocal pulmonary abscess formation. 3. 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 This likely represents the source of emboli in this patient  with evidence of septic embolization. 4. Moderate bilateral pleural effusions lying dependently with associated areas of passive atelectasis in the lower lobes of the lungs bilaterally. 5. Dilatation of the pulmonic trunk (3.6 cm in diameter), concerning for pulmonary arterial hypertension. 6. Additional incidental findings, as above.   12/24 abd/pelv ct with contrast Multiple BILATERAL pulmonary nodules up to 12 mm diameter, some of which appear ill-defined and slightly shaggy.  These could represent metastatic foci or septic emboli.  Correlation with cardiology recommended to exclude source of septic embolism.  4 mm LEFT UPJ calculus with mild LEFT hydronephrosis and hydroureter.  Significant  emphysematous pyelonephritis changes involving the LEFT kidney with a 6.4 x 3.6 x 5.9 cm diameter abscess collection inferior to the LEFT kidney.  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  Recommend annual imaging followup by CTA or MRA. This recommendation follows 2010 ACCF/AHA/AATS/ACR/ASA/SCA/SCAI/SIR/STS/SVM Guidelines for the Diagnosis and Management of Patients with Thoracic Aortic Disease. Circulation. 2010; 121: E831-D176. Aortic aneurysm NOS (ICD10-I71.9)  Aortic Atherosclerosis (ICD10-I70.0)  Jabier Mutton, [facility] [place] [phone] pager    08/14/2020, 9:22 AM

## 2020-08-14 NOTE — Progress Notes (Signed)
Referring Physician(s): Pace,M  Supervising Physician: Marliss Coots  Patient Status:  [facility]  Chief Complaint: Leftemphysematous pyelonephritis/retroperitoneal/perinephric abscess   Subjective: Pt without acute changes; denies fever/chills, resp issues, sig abd/back pain or N/V   Allergies: Codeine  Medications: Prior to Admission medications   Medication Sig Start Date End Date Taking? Authorizing Provider  acetaminophen (TYLENOL) 500 MG tablet Take 1,000 mg by mouth every 6 (six) hours as needed.   Yes [provider]  Pseudoeph-Doxylamine-DM-APAP (NYQUIL PO) Take 1 tablet by mouth daily as needed (congestion).   Yes [provider]     Vital Signs: BP 139/74 (BP Location: Left Arm)   Pulse 88   Temp 98 F (36.7 C) (Oral)   Resp 20   Ht 5\' 2"  (1.575 m)   Wt 176 lb 9.4 oz (80.1 kg)   LMP  (LMP Unknown)   SpO2 95%   BMI 32.30 kg/m   Physical Exam awake/alert;left PCN/RP drains intact, insertion sites ok, NT; OP left PCN 500 cc yellow urine; left RP/pelvic drain- 225 cc yesterday, 25 cc so far today purulent reddish-beige fluid  Imaging: CT ABDOMEN PELVIS W WO CONTRAST  Result Date: 08/13/2020 CLINICAL DATA:  Complicated pyelonephritis.  Abscess. EXAM: CT ABDOMEN AND PELVIS WITHOUT AND WITH CONTRAST TECHNIQUE: Multidetector CT imaging of the abdomen and pelvis was performed following the standard protocol before and following the bolus administration of intravenous contrast. CONTRAST:  10/11/2020 OMNIPAQUE IOHEXOL 300 MG/ML  SOLN COMPARISON:  08/08/2020 FINDINGS: Lower Chest: Small bilateral pleural effusions are again seen with atelectasis or consolidation in the posterior lower lobes. Multiple small pulmonary nodules are again seen in the visualized portion of the right lower lung, without significant change. Hepatobiliary: No hepatic masses identified. Gallbladder is unremarkable. No evidence of biliary ductal dilatation. Pancreas:  No mass or  inflammatory changes. Spleen: Within normal limits in size and appearance. Adrenals/Urinary Tract: A left percutaneous nephrostomy tube is seen in appropriate position and there is no evidence of hydronephrosis. Diffuse left renal swelling is again seen with multiple small irregular fluid density collections throughout the renal parenchyma, and a perinephric fluid and gas collection is again seen along the lateral and posterior margins of the kidney. This measures 9.4 x 4.3 cm on image 29/7, increased from 7.4 x 2.5 cm when remeasured in same planes on prior exam. A 2nd percutaneous drainage catheter is seen in the left lower quadrant along the anterior aspect of the left psoas muscle. Left retroperitoneal fluid collection in this region measures 8.7 x 4.4 cm on image 54/2, without significant change. A 3rd fluid and gas collection is seen in the left pararenal space which measures 6.7 x 2.7 cm on image 46/7, without significant change in size since prior exam. Foley catheter is noted within the urinary bladder. Stomach/Bowel: No evidence of obstruction, inflammatory process or abnormal fluid collections. Vascular/Lymphatic: No pathologically enlarged lymph nodes. No abdominal aortic aneurysm. Reproductive:  No mass or other significant abnormality. Other:  None. Musculoskeletal:  No suspicious bone lesions identified. IMPRESSION: Left percutaneous nephrostomy tube in appropriate position. No evidence of hydronephrosis. Stable diffuse left renal parenchymal swelling with multiple small internal fluid density collections. Mild increase in size of left perinephric fluid and gas collection. No significant change in size of fluid and gas collections along the anterior aspect of left psoas muscle, and in the left lateral pararenal space. Stable small bilateral pleural effusions and posterior lower lobe atelectasis or consolidation. Stable small right lower lung pulmonary  nodules. Electronically Signed   By: Danae Orleans  M.D.   On: 08/13/2020 08:27    Labs:  CBC: Recent Labs    08/10/20 0522 08/11/20 1156 08/12/20 0321 08/13/20 0302  WBC 10.0 6.9 6.2 5.5  HGB 9.0* 8.7* 8.7* 8.3*  HCT 28.3* 27.4* 27.8* 26.2*  PLT 296 268 283 311    COAGS: Recent Labs    08/14/20 0327  INR 1.0    BMP: Recent Labs    08/11/20 1156 08/12/20 0321 08/13/20 0302 08/14/20 0327  NA 140 140 139 142  K 3.6 3.9 4.2 4.2  CL 104 106 106 110  CO2 25 25 24 23   GLUCOSE 193* 127* 115* 113*  BUN 16 19 20 17   CALCIUM 8.1* 8.2* 8.1* 8.4*  CREATININE 0.92 0.94 0.84 0.94  GFRNONAA >60 >60 >60 >60    LIVER FUNCTION TESTS: Recent Labs    07/31/20 1913 08/01/20 0641 08/08/20 0823 08/09/20 0528 08/10/20 0522  BILITOT 1.3* 0.9  --  0.7 0.5  AST 14* 19  --  22 22  ALT 16 19  --  8 11  ALKPHOS 147* 142*  --  165* 154*  PROT 5.4* 5.4*  --  5.1* 5.2*  ALBUMIN 2.0* 2.1* 2.0* 2.0* 2.2*    Assessment and Plan: Patient with history of left emphysematous pyelonephritis with associated retroperitoneal abscess, status post left nephrostomy andleftretroperitoneal/pelvic abscess drain placements12/24/21;afebrile, prev cultures with E. Coli;last WBC nl; hgb 8.3, creat nl; CT A/P 1/5 revealed:   Left percutaneous nephrostomy tube in appropriate position. No evidence of hydronephrosis.  Stable diffuse left renal parenchymal swelling with multiple small internal fluid density collections.  Mild increase in size of left perinephric fluid and gas collection.  No significant change in size of fluid and gas collections along the anterior aspect of left psoas muscle, and in the left lateral pararenal space.  Stable small bilateral pleural effusions and posterior lower lobe atelectasis or consolidation.  Stable small right lower lung pulmonary nodules  Request received from urology for additional drain placement into residual left perinephric abscess; images were reviewed by Dr. 10/08/20 ; Risks and benefits  discussed with the patient including bleeding, infection, damage to adjacent structures, bowel perforation/fistula connection, and sepsis.  All of the patient's questions were answered, patient is agreeable to proceed. Consent signed and in chart.  Procedure tent planned for later today   Electronically Signed: D. 08/03/20, PA-C 08/14/2020, 9:30 AM   I spent a total of 20 minutes at the the patient's bedside AND on the patient's hospital floor or unit, greater than 50% of which was counseling/coordinating care for left nephrostomy/retroperitoneal abscess drain, planned left perinephric drain    Patient ID: Tammie [name], female   DOB: 1961-06-12, 60 y.o.   MRN: [id number]

## 2020-08-14 NOTE — Progress Notes (Signed)
Nutrition Follow-up  DOCUMENTATION CODES:   Obesity unspecified  INTERVENTION:   -MVI with minerals daily -Continue Glucerna Shake po BID, each supplement provides 220 kcal and 10 grams of protein  NUTRITION DIAGNOSIS:   Increased nutrient needs related to acute illness as evidenced by estimated needs.  Ongoing  GOAL:   Patient will meet greater than or equal to 90% of their needs  Progressing   MONITOR:   PO intake,Supplement acceptance,Diet advancement,Labs,Weight trends,Skin,I & O's  REASON FOR ASSESSMENT:   Consult Diet education  ASSESSMENT:   Patient is an obese 60 yo female with history of poor medical follow up. She presents complaining of abdominal pain and decreased oral intake. Multifocal pneumonia, hyperosmolar hyperglycemia, AKI, Hyponatremia, Hypokalemia,  Hyperphosphatemia, hypomagnesemia and hypertension.  12/23: admitted 12/24: RD at [facility] provided DM diet education, transferred to [facility] ICU, s/p L nephrostomy placement, L pelvic abscess drain placement 1/1: PICC placed for IV antibiotics   Reviewed I/O's: -2.9 L x 24 hours and -18 L since admission  UOP: 3.6 L x 24 hours  Drain output: 225 ml x 24 hours  Per MD notes, pt with multiple pulmonary nodules; suspicious for septic embolic, but cannot rule out malignancy. Per PCCM, pt will needs a repeat of CT of chest in 6 weeks   Per urology notes, CT of abdomen and pelvis on 1/5/ revealed lt renal swelling with multiple fluid collections (consistent with emphysematous pyelonephritis). Plan for another drain placement today; pt currently NPO for procedure.   Pt unavailable at time of attempted contact.   Pt with good appetite. She was previously on a carb modified diet, consuming 100% of meals. Pt with variable acceptance of Glucerna supplements.   Labs reviewed: Mg: 1.3, CBGS: 97-202 (inpatient orders for glycemic control are 0-20 units insulin aspart TID with meals, 0-5 units insulin aspart daily at  bedtime, and 8 units insulin glargine BID).   Diet Order:   Diet Order            Diet NPO time specified  Diet effective midnight                 EDUCATION NEEDS:   Education needs have been addressed  Skin:  Skin Assessment: Skin Integrity Issues: Skin Integrity Issues:: Stage II Stage II: sacrum (epithelialized)  Last BM:  08/13/20  Height:   Ht Readings from Last 1 Encounters:  08/01/20 5\' 2"  (1.575 m)    Weight:   Wt Readings from Last 1 Encounters:  08/13/20 80.1 kg   BMI:  Body mass index is 32.3 kg/m.  Estimated Nutritional Needs:   Kcal:  1550-1750  Protein:  85-100 grams  Fluid:  > 1.5 L    10/11/20, RD, LDN, CDCES Registered Dietitian II Certified Diabetes Care and Education Specialist Please refer to [facility] for RD and/or RD on-call/weekend/after hours pager

## 2020-08-14 NOTE — Procedures (Signed)
Interventional Radiology Procedure Note  Procedure: CT guided left perinephric abscess drain placement  Findings: Please refer to procedural dictation for full description. 12 Fr drain placed posterior to kidney within abscess.  100 mL frank pus aspirated, sample sent for culture.  Placed drain to bulb suction.  Complications: None immediate  Estimated Blood Loss: < 5 mL  Recommendations: Keep to bulb suction for now.  Follow up cultures. IR will follow.   Marliss Coots, MD

## 2020-08-15 DIAGNOSIS — N151 Renal and perinephric abscess: Secondary | ICD-10-CM | POA: Diagnosis not present

## 2020-08-15 DIAGNOSIS — Z936 Other artificial openings of urinary tract status: Secondary | ICD-10-CM | POA: Diagnosis not present

## 2020-08-15 DIAGNOSIS — N10 Acute tubulo-interstitial nephritis: Secondary | ICD-10-CM | POA: Diagnosis not present

## 2020-08-15 DIAGNOSIS — Z978 Presence of other specified devices: Secondary | ICD-10-CM | POA: Diagnosis not present

## 2020-08-15 LAB — CBC WITH DIFFERENTIAL/PLATELET
Abs Immature Granulocytes: 0.09 10*3/uL — ABNORMAL HIGH (ref 0.00–0.07)
Basophils Absolute: 0 10*3/uL (ref 0.0–0.1)
Basophils Relative: 1 %
Eosinophils Absolute: 0.3 10*3/uL (ref 0.0–0.5)
Eosinophils Relative: 5 %
HCT: 28 % — ABNORMAL LOW (ref 36.0–46.0)
Hemoglobin: 8.8 g/dL — ABNORMAL LOW (ref 12.0–15.0)
Immature Granulocytes: 2 %
Lymphocytes Relative: 17 %
Lymphs Abs: 1.1 10*3/uL (ref 0.7–4.0)
MCH: 30.6 pg (ref 26.0–34.0)
MCHC: 31.4 g/dL (ref 30.0–36.0)
MCV: 97.2 fL (ref 80.0–100.0)
Monocytes Absolute: 0.7 10*3/uL (ref 0.1–1.0)
Monocytes Relative: 12 %
Neutro Abs: 4 10*3/uL (ref 1.7–7.7)
Neutrophils Relative %: 63 %
Platelets: 368 10*3/uL (ref 150–400)
RBC: 2.88 MIL/uL — ABNORMAL LOW (ref 3.87–5.11)
RDW: 15.2 % (ref 11.5–15.5)
WBC: 6.2 10*3/uL (ref 4.0–10.5)
nRBC: 0 % (ref 0.0–0.2)

## 2020-08-15 LAB — BASIC METABOLIC PANEL
Anion gap: 8 (ref 5–15)
BUN: 13 mg/dL (ref 6–20)
CO2: 24 mmol/L (ref 22–32)
Calcium: 8.1 mg/dL — ABNORMAL LOW (ref 8.9–10.3)
Chloride: 107 mmol/L (ref 98–111)
Creatinine, Ser: 0.8 mg/dL (ref 0.44–1.00)
GFR, Estimated: >60 mL/min (ref >60)
Glucose, Bld: 120 mg/dL — ABNORMAL HIGH (ref 70–99)
Potassium: 4.1 mmol/L (ref 3.5–5.1)
Sodium: 139 mmol/L (ref 135–145)

## 2020-08-15 LAB — MAGNESIUM: Magnesium: 2.3 mg/dL (ref 1.7–2.4)

## 2020-08-15 LAB — GLUCOSE, CAPILLARY
Glucose-Capillary: 119 mg/dL — ABNORMAL HIGH (ref 70–99)
Glucose-Capillary: 184 mg/dL — ABNORMAL HIGH (ref 70–99)
Glucose-Capillary: 101 mg/dL — ABNORMAL HIGH (ref 70–99)
Glucose-Capillary: 149 mg/dL — ABNORMAL HIGH (ref 70–99)

## 2020-08-15 NOTE — Progress Notes (Signed)
PHARMACY CONSULT NOTE FOR:  OUTPATIENT  PARENTERAL ANTIBIOTIC THERAPY (OPAT)  Indication: emphysematous pyelo, intra-abdominal abscess Regimen: Cefazolin 2 gm IV Q 8 hours End date: 09/25/20  IV antibiotic discharge orders are pended. To discharging provider:  please sign these orders via discharge navigator,  Select New Orders & click on the button choice - Manage This Unsigned Work.     Thank you for allowing pharmacy to be a part of this patient's care.  Sharin Mons, PharmD, BCPS, BCIDP Infectious Diseases Clinical Pharmacist Phone: [phone] 08/15/2020, 2:04 PM

## 2020-08-15 NOTE — Progress Notes (Signed)
Referring Physician(s): Pace,M  Supervising Physician: Richarda Overlie  Patient Status:  [facility] - In-pt  Chief Complaint:  Leftemphysematous pyelonephritis/retroperitoneal/perinephricabscess  Subjective: Patient without new complaints.  Only reports some minimal left flank soreness at drain sites; denies fever, headache, respiratory problems, nausea, vomiting or bleeding   Allergies: Codeine  Medications: Prior to Admission medications   Medication Sig Start Date End Date Taking? Authorizing Provider  acetaminophen (TYLENOL) 500 MG tablet Take 1,000 mg by mouth every 6 (six) hours as needed.   Yes [provider]  Pseudoeph-Doxylamine-DM-APAP (NYQUIL PO) Take 1 tablet by mouth daily as needed (congestion).   Yes [provider]     Vital Signs: BP 140/71 (BP Location: Left Arm)   Pulse 96   Temp 98.9 F (37.2 C) (Oral)   Resp (!) 25   Ht 5\' 2"  (1.575 m)   Wt 171 lb 1.2 oz (77.6 kg)   LMP  (LMP Unknown)   SpO2 95%   BMI 31.29 kg/m   Physical Exam awake, alert.  Left nephrostomy/perinephric and pelvic/retroperitoneal drains intact, insertion sites okay, mildly tender, outputs yesterday ranged from 135 to 175 cc; today 25-50 cc each so far; purulent output from retroperitoneal and perinephric drains  Imaging: CT ABDOMEN PELVIS W WO CONTRAST  Result Date: 08/13/2020 CLINICAL DATA:  Complicated pyelonephritis.  Abscess. EXAM: CT ABDOMEN AND PELVIS WITHOUT AND WITH CONTRAST TECHNIQUE: Multidetector CT imaging of the abdomen and pelvis was performed following the standard protocol before and following the bolus administration of intravenous contrast. CONTRAST:  10/11/2020 OMNIPAQUE IOHEXOL 300 MG/ML  SOLN COMPARISON:  08/08/2020 FINDINGS: Lower Chest: Small bilateral pleural effusions are again seen with atelectasis or consolidation in the posterior lower lobes. Multiple small pulmonary nodules are again seen in the visualized portion of the right lower lung, without  significant change. Hepatobiliary: No hepatic masses identified. Gallbladder is unremarkable. No evidence of biliary ductal dilatation. Pancreas:  No mass or inflammatory changes. Spleen: Within normal limits in size and appearance. Adrenals/Urinary Tract: A left percutaneous nephrostomy tube is seen in appropriate position and there is no evidence of hydronephrosis. Diffuse left renal swelling is again seen with multiple small irregular fluid density collections throughout the renal parenchyma, and a perinephric fluid and gas collection is again seen along the lateral and posterior margins of the kidney. This measures 9.4 x 4.3 cm on image 29/7, increased from 7.4 x 2.5 cm when remeasured in same planes on prior exam. A 2nd percutaneous drainage catheter is seen in the left lower quadrant along the anterior aspect of the left psoas muscle. Left retroperitoneal fluid collection in this region measures 8.7 x 4.4 cm on image 54/2, without significant change. A 3rd fluid and gas collection is seen in the left pararenal space which measures 6.7 x 2.7 cm on image 46/7, without significant change in size since prior exam. Foley catheter is noted within the urinary bladder. Stomach/Bowel: No evidence of obstruction, inflammatory process or abnormal fluid collections. Vascular/Lymphatic: No pathologically enlarged lymph nodes. No abdominal aortic aneurysm. Reproductive:  No mass or other significant abnormality. Other:  None. Musculoskeletal:  No suspicious bone lesions identified. IMPRESSION: Left percutaneous nephrostomy tube in appropriate position. No evidence of hydronephrosis. Stable diffuse left renal parenchymal swelling with multiple small internal fluid density collections. Mild increase in size of left perinephric fluid and gas collection. No significant change in size of fluid and gas collections along the anterior aspect of left psoas muscle, and in the left lateral pararenal space. Stable  small bilateral  pleural effusions and posterior lower lobe atelectasis or consolidation. Stable small right lower lung pulmonary nodules. Electronically Signed   By: Marlaine Hind M.D.   On: 08/13/2020 08:27   CT IMAGE GUIDED DRAINAGE BY PERCUTANEOUS CATHETER  Result Date: 08/14/2020 INDICATION: 60 year old female with history of emphysematous pyelonephritis with persistent right perinephric fluid collections status post nephrostomy tube placement hand left retroperitoneal drain placement. EXAM: CT IMAGE GUIDED DRAINAGE BY PERCUTANEOUS CATHETER COMPARISON:  None. MEDICATIONS: The patient is currently admitted to the hospital and receiving intravenous antibiotics. The antibiotics were administered within an appropriate time frame prior to the initiation of the procedure. ANESTHESIA/SEDATION: Moderate (conscious) sedation was employed during this procedure. A total of Versed 2 mg and Fentanyl 100 mcg was administered intravenously. Moderate Sedation Time: 20 minutes. The patient's level of consciousness and vital signs were monitored continuously by radiology nursing throughout the procedure under my direct supervision. CONTRAST:  None COMPLICATIONS: None immediate. PROCEDURE: Informed written consent was obtained from the patient after a discussion of the risks, benefits and alternatives to treatment. The patient was placed in a partial right lateral decubitus position on the CT gantry and a pre procedural CT was performed re-demonstrating the known abscess/fluid collection within the left posterior perinephric space. The procedure was planned. A timeout was performed prior to the initiation of the procedure. The left flank was prepped and draped in the usual sterile fashion. The overlying soft tissues were anesthetized with 1% lidocaine with epinephrine. Appropriate trajectory was planned with the use of a 22 gauge spinal needle. An 18 gauge trocar needle was advanced into the abscess/fluid collection and a short Amplatz super  stiff wire was coiled within the collection. Appropriate positioning was confirmed with a limited CT scan. The tract was serially dilated allowing placement of a 12 [place] all-purpose drainage catheter. Appropriate positioning was confirmed with a limited postprocedural CT scan. Approximately 100 ml of purulent fluid was aspirated. The tube was connected to a bulb suction and sutured in place. A dressing was placed. The patient tolerated the procedure well without immediate post procedural complication. IMPRESSION: Successful CT guided placement of a 10 French all purpose drain catheter into the left posterior perinephric abscess with aspiration of 100 mL of purulent fluid. Samples were sent to the laboratory as requested by the ordering clinical team. Ruthann Cancer, MD [facility] Radiology Electronically Signed   By: Ruthann Cancer MD   On: 08/14/2020 15:01    Labs:  CBC: Recent Labs    08/11/20 1156 08/12/20 0321 08/13/20 0302 08/15/20 0416  WBC 6.9 6.2 5.5 6.2  HGB 8.7* 8.7* 8.3* 8.8*  HCT 27.4* 27.8* 26.2* 28.0*  PLT 268 283 311 368    COAGS: Recent Labs    08/14/20 0327  INR 1.0    BMP: Recent Labs    08/12/20 0321 08/13/20 0302 08/14/20 0327 08/15/20 0416  NA 140 139 142 139  K 3.9 4.2 4.2 4.1  CL 106 106 110 107  CO2 25 24 23 24   GLUCOSE 127* 115* 113* 120*  BUN 19 20 17 13   CALCIUM 8.2* 8.1* 8.4* 8.1*  CREATININE 0.94 0.84 0.94 0.80  GFRNONAA >60 >60 >60 >60    LIVER FUNCTION TESTS: Recent Labs    07/31/20 1913 08/01/20 0641 08/08/20 0823 08/09/20 0528 08/10/20 0522  BILITOT 1.3* 0.9  --  0.7 0.5  AST 14* 19  --  22 22  ALT 16 19  --  8 11  ALKPHOS 147* 142*  --  165* 154*  PROT 5.4* 5.4*  --  5.1* 5.2*  ALBUMIN 2.0* 2.1* 2.0* 2.0* 2.2*    Assessment and Plan: Patient with history of left emphysematous pyelonephritis with associated retroperitoneal abscess, status post left nephrostomy  andleftretroperitoneal/pelvic abscess drain placements12/24/21;s/p additional left posterior perinephric drain placement 1/6; afebrile,prev cultures with E. Coli; latest cx pend; WBC normal, creatinine normal, hemoglobin 8.8; as outpatient recommend once daily irrigation of drains with 5 cc sterile saline, output recording and dressing changes as needed; patient given output recording card as well as prescription for saline flushes; she will be scheduled for follow-up in IR drain clinic in 1 to 2 weeks.   Electronically Signed: D. Jeananne Rama, PA-C 08/15/2020, 3:09 PM   I spent a total of 15 minutes at the the patient's bedside AND on the patient's hospital floor or unit, greater than 50% of which was counseling/coordinating care for left nephrostomy, left erinephric/retroperitoneal abscess drains    Patient ID: Tammie [name], female   DOB: 1960-09-07, 60 y.o.   MRN: [id number]

## 2020-08-15 NOTE — Plan of Care (Signed)
  Problem: Clinical Measurements: Goal: Will remain free from infection Outcome: Progressing Goal: Diagnostic test results will improve Outcome: Progressing   Problem: Skin Integrity: Goal: Risk for impaired skin integrity will decrease Outcome: Progressing   

## 2020-08-15 NOTE — Plan of Care (Signed)
  Problem: Health Behavior/Discharge Planning: Goal: Ability to manage health-related needs will improve Outcome: Progressing   Problem: Clinical Measurements: Goal: Will remain free from infection Outcome: Progressing Goal: Diagnostic test results will improve Outcome: Progressing   Problem: Pain Managment: Goal: General experience of comfort will improve Outcome: Progressing

## 2020-08-15 NOTE — Progress Notes (Signed)
PROGRESS NOTE    [name]  ELF:[id number] DOB: 08-04-1961 DOA: 07/31/2020 PCP: Patient, No Pcp Per    Chief Complaint  Patient presents with  . Abdominal Pain    Brief Narrative:   Tammie [name] is a 60 y.o. female with medical history significant for hypertension, obesity, type II DM, who was admitted 12/24 with chief complaint of abdominal pain.  She was found to have acute pyelonephritis with left renal abscess, severe sepsis, acute kidney injury, hyper osmolar nonketotic hyperglycemia (initial blood sugar in the  than 700s), severe hyponatremia. Inthe ED:CT imaging showed multiple pulmonary nodules, imaging of abdomen pelvis With left hydronephrosis and evidence of perinephritic abscess.  She was transferred to vascular hospital for placement of left nephrostomy tube and left pelvic abscess drain.  She was initially admitted to the ICU where she was given 3% hypertonic saline infusion for severe hyponatremia.  She was also treated with empiric IV antibiotics for severe sepsis secondary to left pyelonephritis, left renal abscess and pelvic abscess.  Subjective:  She had a third drain placed yesterday, denies significant pain, no fever Husband at bedside  Assessment & Plan:   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 with type 2 diabetes mellitus (HCC)   Ileus (HCC)   Emphysematous pyelonephritis of left kidney   Pleural effusion  Severe sepsis with E. coli bacteremia/left emphysematouspyelonephritis with  perinephric abscess and retroperitoneal abscess -status post left nephrostomy andleftretroperitoneal/pelvic abscess drain placements12/24/21, IR following -Was on Rocephin now  Antibiotics have been deescalated to IV Ancef, right upper extremity PICC line placed on January 1, ID following -repeat ct scan 1/05 showed increased size of perirenal abscess, case discussed with urology and interventional radiology, -s/p third drain placement on 1/6  -Will follow IR and urology recommendation -WBC normalized, no fever, currently denies pain   Numerous pulmonary nodules, Suspect septic emboli, but cannot rule out malignancy - respiratory status stable, denies chest pain, denies short of breath, not on oxygen - nonocclusive thrombus forming in the left renal vein likely represents source of emboli. -As patient clinically improving urology recommending to continue current regimen of IV antibiotics,  -IDfollowing  -Per pulmonary patient will need repeat CT chest done in approximately 6 weeks.   -Bibasilar crackles/moderatebilateral pleural effusion She had received IV Lasix on 08/09/2020 and 08/10/2020.Marland Kitchen   AKI without known history of CKD/anemia of chronic disease - BUN 112/creatinine 4.19 on presentation, BUN 20/creatinine 0.84 today - Likely multifactorial secondary to emphysematous pyelonephritis with abscess, ischemic ATN in the setting of volume depletion. - Status post percutaneous nephrostomy and perirenal abscess drainage. -Foley catheter in place. -Urology following.     Questionable ileus, resolving -Abdominal films obtained concerning for possible ileus. CT abdomen and pelvis obtained negative for ileus. -Continue to advance diet as tolerated -Encourage ambulation, having BM  Hypokalemia/hypomagnesemia K normalized, mag 1.3 today, will give IV mag  Newly diagnosed diabetes mellitus type 2/HHNK Hemoglobin A1c noted at 12.2.  Continue Lantus 8 units  twice daily. Sliding scale insulin. Blood sugars  have been stable. Diabetic coordinator following.  A.m. glucose 115  Severe hyponatremia, resolved Sodium less than 102 on presentation Etiology is unclear.  Initially required treatment with hypertonic saline in the ICU. Sodium has since normalized  Body mass index is 32.3 kg/m.  ` Clinically improving, telemetry has been unremarkable, DC telemetry  Consultants:  [place]  Urologist  Interventional radiologist  General surgeon  Nephrologist  Infectious disease  Procedures:  CT-guided placement of left nephrostomy tube and left pelvic abscess drain on 08/01/2020  PICC line placement on January 1 CT guided left perinephric abscess drain placement on 1/6   DVT prophylaxis: SCDs   Code Status: Full Family Communication: Husband at bedside Disposition:   Status is: Inpatient   Dispo: The patient is from: Home              Anticipated d/c is to: Home with home health              Anticipated d/c date is: Need urology clearance                  Antimicrobials:   Anti-infectives (From admission, onward)   Start     Dose/Rate Route Frequency Ordered Stop   08/05/20 1000  ceFAZolin (ANCEF) IVPB 2g/100 mL premix        2 g 200 mL/hr over 30 Minutes Intravenous Every 8 hours 08/05/20 0903     08/04/20 2200  ceFAZolin (ANCEF) IVPB 2g/100 mL premix  Status:  Discontinued        2 g 200 mL/hr over 30 Minutes Intravenous Every 12 hours 08/04/20 1131 08/05/20 0903   08/02/20 0000  piperacillin-tazobactam (ZOSYN) IVPB 3.375 g  Status:  Discontinued        3.375 g 12.5 mL/hr over 240 Minutes Intravenous Every 12 hours 08/01/20 1403 08/01/20 2006   08/01/20 2100  cefTRIAXone (ROCEPHIN) 2 g in sodium chloride 0.9 % 100 mL IVPB  Status:  Discontinued        2 g 200 mL/hr over 30 Minutes Intravenous Every 24 hours 08/01/20 2006 08/04/20 1131   08/01/20 1430  piperacillin-tazobactam (ZOSYN) IVPB 3.375 g         3.375 g 100 mL/hr over 30 Minutes Intravenous  Once 08/01/20 1355 08/01/20 1502   07/31/20 2000  cefTRIAXone (ROCEPHIN) 2 g in sodium chloride 0.9 % 100 mL IVPB  Status:  Discontinued        2 g 200 mL/hr over 30 Minutes Intravenous Every 24 hours 07/31/20 1947 08/01/20 1351   07/31/20 2000  azithromycin (ZITHROMAX) 500 mg in sodium chloride 0.9 % 250 mL IVPB  Status:  Discontinued        500 mg 250 mL/hr over 60 Minutes Intravenous Every 24 hours 07/31/20 1947 08/01/20 1351          Objective: Vitals:   08/14/20 1406 08/14/20 2102 08/15/20 0522 08/15/20 0548  BP: 125/71 (!) 142/75 129/69   Pulse: 98 (!) 102 88   Resp: 17  20   Temp: 98.4 F (36.9 C) 99.1 F (37.3 C) 98.2 F (36.8 C)   TempSrc: Oral Oral Oral   SpO2: 97% 96% 94%   Weight:    77.6 kg  Height:        Intake/Output Summary (Last 24 hours) at 08/15/2020 0938 Last data filed at 08/15/2020 0538 Gross per 24 hour  Intake 884.12 ml  Output 2785 ml  Net -1900.88 ml   Filed Weights   08/12/20 0500 08/13/20 0428  08/15/20 0548  Weight: 78.6 kg 80.1 kg 77.6 kg    Examination:  General exam: calm, NAD, +3 drains on left side, + indwelling Foley catheter Respiratory system: Clear to auscultation. Respiratory effort normal. Cardiovascular system: S1 & S2 heard, RRR.  No pedal edema. Gastrointestinal system: Abdomen is nondistended, soft and nontender.  Normal bowel sounds heard. Central nervous system: Alert and oriented. No focal neurological deficits. Extremities: Symmetric 5 x 5 power. Skin: No rashes, lesions or ulcers Psychiatry: Judgement and insight appear normal. Mood & affect appropriate.     Data Reviewed: I have personally reviewed following labs and imaging studies  CBC: Recent Labs  Lab 08/09/20 0528 08/10/20 0522 08/11/20 1156 08/12/20 0321 08/13/20 0302 08/15/20 0416  WBC 10.6* 10.0 6.9 6.2 5.5 6.2  NEUTROABS 8.9* 8.1* 5.0 4.4  --  4.0  HGB 9.0* 9.0* 8.7* 8.7* 8.3* 8.8*  HCT 28.0*  28.3* 27.4* 27.8* 26.2* 28.0*  MCV 96.2 94.6 95.1 96.5 97.4 97.2  PLT 297 296 268 283 311 368    Basic Metabolic Panel: Recent Labs  Lab 08/09/20 0528 08/10/20 0522 08/11/20 1156 08/12/20 0321 08/13/20 0302 08/14/20 0327 08/15/20 0416  NA 137 139 140 140 139 142 139  K 3.6 3.0* 3.6 3.9 4.2 4.2 4.1  CL 100 103 104 106 106 110 107  CO2 28 27 25 25 24 23 24   GLUCOSE 126* 132* 193* 127* 115* 113* 120*  BUN 24* 20 16 19 20 17 13   CREATININE 1.03* 0.91 0.92 0.94 0.84 0.94 0.80  CALCIUM 8.0* 8.0* 8.1* 8.2* 8.1* 8.4* 8.1*  MG 1.6* 2.1 1.3* 1.7  --  1.3* 2.3  PHOS 2.9 2.9  --   --   --   --   --     GFR: Estimated Creatinine Clearance: 73 mL/min (by C-G formula based on SCr of 0.8 mg/dL).  Liver Function Tests: Recent Labs  Lab 08/09/20 0528 08/10/20 0522  AST 22 22  ALT 8 11  ALKPHOS 165* 154*  BILITOT 0.7 0.5  PROT 5.1* 5.2*  ALBUMIN 2.0* 2.2*    CBG: Recent Labs  Lab 08/14/20 0756 08/14/20 1209 08/14/20 1643 08/14/20 2057 08/15/20 0732  GLUCAP 104* 93 133* 116* 119*     Recent Results (from the past 240 hour(s))  Aerobic/Anaerobic Culture (surgical/deep wound)     Status: None (Preliminary result)   Collection Time: 08/14/20  1:53 PM   Specimen: Abdomen; Abscess  Result Value Ref Range Status   Specimen Description   Final    ABDOMEN Performed at [facility], [address], [place], [place]    Special Requests   Final    NONE Performed at [facility], [address], [place], [place]    Gram Stain   Final    ABUNDANT WBC PRESENT, PREDOMINANTLY PMN NO ORGANISMS SEEN Performed at [facility], [address] [place], [address] [place]    Culture PENDING  Incomplete   Report Status PENDING  Incomplete         Radiology Studies: CT IMAGE GUIDED DRAINAGE BY PERCUTANEOUS CATHETER  Result Date: 08/14/2020 INDICATION: 60 year old female with history of emphysematous pyelonephritis  with persistent right perinephric fluid collections status post nephrostomy tube placement hand left retroperitoneal drain placement. EXAM: CT IMAGE GUIDED DRAINAGE BY PERCUTANEOUS CATHETER COMPARISON:  None. MEDICATIONS: The patient is currently admitted to the hospital and receiving intravenous antibiotics. The antibiotics were administered within an appropriate time frame prior to  the initiation of the procedure. ANESTHESIA/SEDATION: Moderate (conscious) sedation was employed during this procedure. A total of Versed 2 mg and Fentanyl 100 mcg was administered intravenously. Moderate Sedation Time: 20 minutes. The patient's level of consciousness and vital signs were monitored continuously by radiology nursing throughout the procedure under my direct supervision. CONTRAST:  None COMPLICATIONS: None immediate. PROCEDURE: Informed written consent was obtained from the patient after a discussion of the risks, benefits and alternatives to treatment. The patient was placed in a partial right lateral decubitus position on the CT gantry and a pre procedural CT was performed re-demonstrating the known abscess/fluid collection within the left posterior perinephric space. The procedure was planned. A timeout was performed prior to the initiation of the procedure. The left flank was prepped and draped in the usual sterile fashion. The overlying soft tissues were anesthetized with 1% lidocaine with epinephrine. Appropriate trajectory was planned with the use of a 22 gauge spinal needle. An 18 gauge trocar needle was advanced into the abscess/fluid collection and a short Amplatz super stiff wire was coiled within the collection. Appropriate positioning was confirmed with a limited CT scan. The tract was serially dilated allowing placement of a 12 [place] all-purpose drainage catheter. Appropriate positioning was confirmed with a limited postprocedural CT scan. Approximately 100 ml of purulent fluid was aspirated. The tube was  connected to a bulb suction and sutured in place. A dressing was placed. The patient tolerated the procedure well without immediate post procedural complication. IMPRESSION: Successful CT guided placement of a 45 French all purpose drain catheter into the left posterior perinephric abscess with aspiration of 100 mL of purulent fluid. Samples were sent to the laboratory as requested by the ordering clinical team. Marliss Coots, MD [facility]iology Special[facility] Radiology Electronically Signed   By: Marliss Coots MD   On: 08/14/2020 15:01        Scheduled Meds: . Chlorhexidine Gluconate Cloth  6 each Topical Daily  . feeding supplement (GLUCERNA SHAKE)  237 mL Oral BID BM  . insulin aspart  0-20 Units Subcutaneous TID WC  . insulin aspart  0-5 Units Subcutaneous QHS  . insulin glargine  8 Units Subcutaneous BID  . potassium chloride  40 mEq Oral BID  . sodium chloride flush  5 mL Intracatheter Q8H  . sodium chloride flush  5 mL Intracatheter Q8H   Continuous Infusions: . sodium chloride 250 mL (08/12/20 2301)  .  ceFAZolin (ANCEF) IV 2 g (08/15/20 0636)  . sodium chloride       LOS: 15 days   Time spent: , case discussed with ID, IR and urology through secure chat Greater than 50% of this time was spent in counseling, explanation of diagnosis, planning of further management, and coordination of care.  I have personally reviewed and interpreted on  08/15/2020 daily labs, tele strips, I reviewed all nursing notes, pharmacy notes, consultant notes,  vitals, pertinent old records  I have discussed plan of care as described above with RN , patient and family on 08/15/2020  Voice Recognition /Dragon dictation system was used to create this note, attempts have been made to correct errors. Please contact the author with questions and/or clarifications.   Albertine Grates, MD PhD FACP [facility]  Available via Epic secure chat 7am-7pm for nonurgent  issues Please page for urgent issues To page the attending provider between 7A-7P or the covering provider during after hours 7P-7A, please log into the web site [email or web address] and access using universal  [place] password for that web site. If you do not have the password, please call the hospital operator.    08/15/2020, 9:38 AM

## 2020-08-15 NOTE — Progress Notes (Signed)
[facility]  Date of Admission:  07/31/2020      Lines: 1/01-c RUE picc  Abx: 12/27-c cefazolin  12/23-27 ceftriaxone  ASSESSMENT: 60 yo female obese, dm2 admitted 12/23 for severe sepsis with aki, HHS, leukemoid reaction, found to have acute left emphysematous pyelonephritis with left renal vein thrombus, perirenal abscess, bilateral pleural effusion/pulm abscess-nodules, complicated by ecoli bacteremia   Patient is s/p IR percutaneous drainage catheter placement hiv serology negative 12/24 left perirenal abscess fluid ecoli 12/24 ucx 10k insignificant growth 12/23 bcx ecoli (pan sensitive)  Repeat abd/pelv ct with contrast 12/31 showed increased size perirenal abscess along with the moderate bilateral pleural effusion and bilateral pulm abscess/nodules (thought to be from left renal vein thrombotic septic emboli).   Clinically doing better after PCD/nephrostomy tube and appropriate IV abx with (resolved sepsis), improved leukocytosis. Never had fever this admission. Despite pulm involvement/pleural effusion, she is doing well from respiratory standpoint and is not requiring supplemental oxygen  08/15/2020 assessment 1/06 s/p perinephric abscess PCD placement by IR for worsening abscess size on 1/05 repeat ct scan Doing well otherwise  PLAN: 1. Continue cefazolin 2 gram iv q8hours 2. I have setup ID [id number] and OPAT for her 3. Urology following and agree would need for repeat ct scan of abd/pelv in a week to assess clinical improvement    Appointment date and time: 09/04/2020 @ 245 pm with Dr Gale Journey  Final Antibiotics recommendation/duration: Cefazolin 2 gram iv q8hours  Labs Monitor (check all applied): _x_ weekly cbc, cmp, crp __ weekly vancomycin __ trough or __ predialysis level __ other TDM    [facility] address: [facility] Located in: [facility] Address: [address], [place], [place]  [address] Phone: [phone]   Principal Problem:   Severe sepsis [facility]) Active Problems:   Multifocal pneumonia   Hyperosmolar hyperglycemic state (HHS) ([place])   Hyponatremia   Hypertension   Acute renal failure (HCC)   Hypokalemia   Severe protein-calorie malnutrition (HCC)   Class 1 obesity   Leukocytosis   Hypomagnesemia   Hyperphosphatemia   Abdominal distention   Lung nodules   Delirium   Perirenal abscess   E coli bacteremia   Acute pyelonephritis   Bibasilar crackles   Diabetic ketoacidosis without coma associated with type 2 diabetes mellitus (HCC)   Ileus (HCC)   Emphysematous pyelonephritis of left kidney   Pleural effusion   Scheduled Meds: . Chlorhexidine Gluconate Cloth  6 each Topical Daily  . feeding supplement (GLUCERNA SHAKE)  237 mL Oral BID BM  . insulin aspart  0-20 Units Subcutaneous TID WC  . insulin aspart  0-5 Units Subcutaneous QHS  . insulin glargine  8 Units Subcutaneous BID  . potassium chloride  40 mEq Oral BID  . sodium chloride flush  5 mL Intracatheter Q8H  . sodium chloride flush  5 mL Intracatheter Q8H   Continuous Infusions: . sodium chloride 250 mL (08/12/20 2301)  .  ceFAZolin (ANCEF) IV 2 g (08/15/20 1318)  . sodium chloride     PRN Meds:.sodium chloride, acetaminophen, albuterol, dextrose, fentaNYL, midazolam, ondansetron **OR** ondansetron (ZOFRAN) IV, polyvinyl alcohol, sodium chloride flush   SUBJECTIVE: No complaint No n/v/diarrhea/chest pain/sob/rash  Review of Systems: ROS Other ros negative  Allergies  Allergen Reactions  . Codeine Rash    OBJECTIVE: Vitals:   08/14/20 2102 08/15/20 0522 08/15/20 0548 08/15/20 1141  BP: (!) 142/75 129/69  140/71  Pulse: (!) 102 88  96  Resp:  20  (!) 25  Temp: 99.1 F (37.3 C) 98.2 F (36.8 C)  98.9 F (37.2 C)  TempSrc: Oral Oral  Oral  SpO2: 96% 94%  95%  Weight:   77.6 kg   Height:       Body mass index is 31.29 kg/m.  Physical exam: No distress,  conversant Heent; normocephalic; per conj clear Neck supple cv rrr no mrg Lungs clear abd soft msk left sided nephrostomy tube/pcd catheters present functioning Neuro nonfocal  Lab Results Lab Results  Component Value Date   WBC 6.2 08/15/2020   HGB 8.8 (L) 08/15/2020   HCT 28.0 (L) 08/15/2020   MCV 97.2 08/15/2020   PLT 368 08/15/2020    Lab Results  Component Value Date   CREATININE 0.80 08/15/2020   BUN 13 08/15/2020   NA 139 08/15/2020   K 4.1 08/15/2020   CL 107 08/15/2020   CO2 24 08/15/2020    Lab Results  Component Value Date   ALT 11 08/10/2020   AST 22 08/10/2020   ALKPHOS 154 (H) 08/10/2020   BILITOT 0.5 08/10/2020     Microbiology: Recent Results (from the past 240 hour(s))  Aerobic/Anaerobic Culture (surgical/deep wound)     Status: None (Preliminary result)   Collection Time: 08/14/20  1:53 PM   Specimen: Abdomen; Abscess  Result Value Ref Range Status   Specimen Description   Final    ABDOMEN Performed at [facility], [address], [place], [place] [address]    Special Requests   Final    NONE Performed at [facility], [address] [place], [place] [address]    Gram Stain   Final    ABUNDANT WBC PRESENT, PREDOMINANTLY PMN NO ORGANISMS SEEN    Culture   Final    NO GROWTH < 12 HOURS Performed at [facility], [address], [place], [place] [address]    Report Status PENDING  Incomplete    Serology: 12/24 hiv screen negative  Imaging: 01/05 abd/pelv ct with contrast Left percutaneous nephrostomy tube in appropriate position. No evidence of hydronephrosis.  Stable diffuse left renal parenchymal swelling with multiple small internal fluid density collections.  Mild increase in size of left perinephric fluid and gas collection.  No significant change in size of fluid and gas collections along the anterior aspect of left psoas muscle, and in the left lateral pararenal  space.  Stable small bilateral pleural effusions and posterior lower lobe atelectasis or consolidation.  Stable small right lower lung pulmonary nodules.    12/31 abd/pelv ct with contrast 1. Compared to the prior study there is been interval nephrostomy tube placement which appears appropriately located, as well as percutaneous drainage catheter placement in the inferior aspect of the patient's extensive perinephric abscess. Unfortunately, the overall size of the abscess appears increased compared to the prior examination, and there is new high attenuation fluid within the inferior aspect of this collection, which could represent some recent hemorrhage. If there is concern for active hemorrhage, further evaluation with triple-phase CT of the abdomen and pelvis should be considered to evaluate for source of active bleeding. 2. Numerous large pulmonary nodules and masses in the lungs bilaterally, with imaging characteristics most compatible with extensive septic emboli with multifocal pulmonary abscess formation. 3. 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 This likely represents the source of emboli in this patient with  evidence of septic embolization. 4. Moderate bilateral pleural effusions lying dependently with associated areas of passive atelectasis in the lower lobes of the lungs bilaterally. 5. Dilatation of the pulmonic trunk (3.6 cm in diameter), concerning for pulmonary arterial hypertension. 6. Additional incidental findings, as above.   12/24 abd/pelv ct with contrast Multiple BILATERAL pulmonary nodules up to 12 mm diameter, some of which appear ill-defined and slightly shaggy.  These could represent metastatic foci or septic emboli.  Correlation with cardiology recommended to exclude source of septic embolism.  4 mm LEFT UPJ calculus with mild LEFT hydronephrosis  and hydroureter.  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  Recommend annual imaging followup by CTA or MRA. This recommendation follows 2010 ACCF/AHA/AATS/ACR/ASA/SCA/SCAI/SIR/STS/SVM Guidelines for the Diagnosis and Management of Patients with Thoracic Aortic Disease. Circulation. 2010; 121: Z025-E527. Aortic aneurysm NOS (ICD10-I71.9)  Aortic Atherosclerosis (ICD10-I70.0)  Raymondo Band, MD [facility] for [facility] Disease [facility] [phone] pager    08/15/2020, 1:57 PM

## 2020-08-15 NOTE — Progress Notes (Addendum)
Urology Inpatient Progress Report          Intv/Subj: No acute events overnight.  She underwent IR drain placement yesterday. She denies flank pain. Purulent drainage from new drain. 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 0.9 %  sodium chloride infusion   Intravenous PRN Lurene Shadow, MD 10 mL/hr at 08/12/20 2301 250 mL at 08/12/20 2301  . acetaminophen (TYLENOL) tablet 650 mg  650 mg Oral Q4H PRN Rodolph Bong, MD   650 mg at 08/14/20 2106  . albuterol (PROVENTIL) (2.5 MG/3ML) 0.083% nebulizer solution 2.5 mg  2.5 mg Nebulization Q4H PRN Simonne Martinet, NP      . ceFAZolin (ANCEF) IVPB 2g/100 mL premix  2 g Intravenous Q8H Simonne Martinet, NP 200 mL/hr at 08/15/20 1318 2 g at 08/15/20 1318  . Chlorhexidine Gluconate Cloth 2 % PADS 6 each  6 each Topical Daily Simonne Martinet, NP   6 each at 08/15/20 1148  . dextrose 50 % solution 0-50 mL  0-50 mL Intravenous PRN Simonne Martinet, NP      . feeding supplement (GLUCERNA SHAKE) (GLUCERNA SHAKE) liquid 237 mL  237 mL Oral BID BM Rodolph Bong, MD   237 mL at 08/13/20 0908  . fentaNYL (SUBLIMAZE) injection   Intravenous PRN Bennie Dallas, MD   50 mcg at 08/14/20 1322  . insulin aspart (novoLOG) injection 0-20  Units  0-20 Units Subcutaneous TID WC Simonne Martinet, NP   4 Units at 08/15/20 1312  . insulin aspart (novoLOG) injection 0-5 Units  0-5 Units Subcutaneous QHS Simonne Martinet, NP      . insulin glargine (LANTUS) injection 8 Units  8 Units Subcutaneous BID Rodolph Bong, MD   8 Units at 08/15/20 [phone]  . midazolam (VERSED) injection   Intravenous PRN Suttle, Thressa Sheller, MD   1 mg at 08/14/20 1323  . ondansetron (ZOFRAN) tablet 4 mg  4 mg Oral Q6H PRN Simonne Martinet, NP       Or  . ondansetron (ZOFRAN) injection 4 mg  4 mg Intravenous Q6H PRN Simonne Martinet, NP      . polyvinyl alcohol (LIQUIFILM TEARS) 1.4 % ophthalmic solution 1 drop  1 drop Both Eyes PRN Rodolph Bong, MD      . potassium chloride SA (KLOR-CON) CR tablet 40 mEq  40 mEq Oral BID Marlin Canary U, DO   40 mEq at 08/15/20 0929  . sodium chloride 0.9 % bolus 1,588 mL  20 mL/kg Intravenous Once Zenia Resides E, NP      . sodium chloride flush (NS) 0.9 % injection 10-40 mL  10-40 mL Intracatheter PRN Kasandra Knudsen D, MD   10 mL at 08/13/20 1724  .  sodium chloride flush (NS) 0.9 % injection 5 mL  5 mL Intracatheter Q8H Simonne Martinet, NP   5 mL at 08/15/20 1325  . sodium chloride flush (NS) 0.9 % injection 5 mL  5 mL Intracatheter Q8H Suttle, Dylan J, MD   5 mL at 08/15/20 1325     Objective: Vital: Vitals:   08/14/20 2102 08/15/20 0522 08/15/20 0548 08/15/20 1141  BP: (!) 142/75 129/69  140/71  Pulse: (!) 102 88  96  Resp:  20  (!) 25  Temp: 99.1 F (37.3 C) 98.2 F (36.8 C)  98.9 F (37.2 C)  TempSrc: Oral Oral  Oral  SpO2: 96% 94%  95%  Weight:   77.6 kg   Height:       I/Os: I/O last 3 completed shifts: In: 1854.1 [P.O.:1200; I.V.:224.1; Other:130; IV Piggyback:300] Out: 3650 [Urine:3250; Drains:400]  Physical Exam:  General: Patient is in no apparent distress Lungs: Normal respiratory effort, chest expands symmetrically. GI: The abdomen is soft and nontender  L PCN tube: draining yellow urine  with debris L perinephric drain: draining blood and purulent drainage Foley: draining clear yellow urine Ext: lower extremities symmetric  Lab Results: Recent Labs    08/13/20 0302 08/15/20 0416  WBC 5.5 6.2  HGB 8.3* 8.8*  HCT 26.2* 28.0*   Recent Labs    08/13/20 0302 08/14/20 0327 08/15/20 0416  NA 139 142 139  K 4.2 4.2 4.1  CL 106 110 107  CO2 24 23 24   GLUCOSE 115* 113* 120*  BUN 20 17 13   CREATININE 0.84 0.94 0.80  CALCIUM 8.1* 8.4* 8.1*   Recent Labs    08/14/20 0327  INR 1.0   No results for input(s): LABURIN in the last 72 hours. Results for orders placed or performed during the hospital encounter of 07/31/20  Culture, blood (single)     Status: Abnormal   Collection Time: 07/31/20  5:26 PM   Specimen: BLOOD  Result Value Ref Range Status   Specimen Description   Final    BLOOD LEFT ANTECUBITAL Performed at [facility], [address] [place], [address] [place]    Special Requests   Final    BOTTLES DRAWN AEROBIC AND ANAEROBIC Blood Culture adequate volume Performed at [facility], [address], [place], [address] [place]    Culture  Setup Time   Final    GRAM NEGATIVE RODS AEROBIC AND ANEROBIC BOTTLES Gram Stain Report Called to,Read Back By and Verified With: LARIMORE, A. 12/24 @ 0740 BEARD, S.  CRITICAL RESULT CALLED TO, READ BACK BY AND VERIFIED WITH: PHARMD  10315 1/25 1921 FCP Performed at [facility], [address], [place], [address] [place]    Culture ESCHERICHIA COLI (A)  Final   Report Status 08/03/2020 FINAL  Final   Organism ID, Bacteria ESCHERICHIA COLI  Final      Susceptibility   Escherichia coli - MIC*    AMPICILLIN <=2 SENSITIVE Sensitive     CEFAZOLIN <=4 SENSITIVE Sensitive     CEFEPIME <=0.12 SENSITIVE Sensitive     CEFTAZIDIME <=1 SENSITIVE Sensitive     CEFTRIAXONE <=0.25 SENSITIVE Sensitive     CIPROFLOXACIN <=0.25 SENSITIVE Sensitive     GENTAMICIN <=1 SENSITIVE Sensitive     IMIPENEM <=0.25  SENSITIVE Sensitive     TRIMETH/SULFA <=20 SENSITIVE Sensitive     AMPICILLIN/SULBACTAM <=2 SENSITIVE Sensitive     PIP/TAZO <=4 SENSITIVE Sensitive     * ESCHERICHIA COLI  Blood  Culture ID Panel (Reflexed)     Status: Abnormal   Collection Time: 07/31/20  5:26 PM  Result Value Ref Range Status   Enterococcus faecalis NOT DETECTED NOT DETECTED Final   Enterococcus Faecium NOT DETECTED NOT DETECTED Final   Listeria monocytogenes NOT DETECTED NOT DETECTED Final   Staphylococcus species NOT DETECTED NOT DETECTED Final   Staphylococcus aureus (BCID) NOT DETECTED NOT DETECTED Final   Staphylococcus epidermidis NOT DETECTED NOT DETECTED Final   Staphylococcus lugdunensis NOT DETECTED NOT DETECTED Final   Streptococcus species NOT DETECTED NOT DETECTED Final   Streptococcus agalactiae NOT DETECTED NOT DETECTED Final   Streptococcus pneumoniae NOT DETECTED NOT DETECTED Final   Streptococcus pyogenes NOT DETECTED NOT DETECTED Final   A.calcoaceticus-baumannii NOT DETECTED NOT DETECTED Final   Bacteroides fragilis NOT DETECTED NOT DETECTED Final   Enterobacterales DETECTED (A) NOT DETECTED Final    Comment: Enterobacterales represent a large order of gram negative bacteria, not a single organism. CRITICAL RESULT CALLED TO, READ BACK BY AND VERIFIED WITH: PHARMD  TERRY G. [id number] 1921 FCP    Enterobacter cloacae complex NOT DETECTED NOT DETECTED Final   Escherichia coli DETECTED (A) NOT DETECTED Final    Comment: CRITICAL RESULT CALLED TO, READ BACK BY AND VERIFIED WITH: PHARMD  TERRY G. [id number] 1921 FCP    Klebsiella aerogenes NOT DETECTED NOT DETECTED Final   Klebsiella oxytoca NOT DETECTED NOT DETECTED Final   Klebsiella pneumoniae NOT DETECTED NOT DETECTED Final   Proteus species NOT DETECTED NOT DETECTED Final   Salmonella species NOT DETECTED NOT DETECTED Final   Serratia marcescens NOT DETECTED NOT DETECTED Final   Haemophilus influenzae NOT DETECTED NOT DETECTED Final   Neisseria  meningitidis NOT DETECTED NOT DETECTED Final   Pseudomonas aeruginosa NOT DETECTED NOT DETECTED Final   Stenotrophomonas maltophilia NOT DETECTED NOT DETECTED Final   Candida albicans NOT DETECTED NOT DETECTED Final   Candida auris NOT DETECTED NOT DETECTED Final   Candida glabrata NOT DETECTED NOT DETECTED Final   Candida krusei NOT DETECTED NOT DETECTED Final   Candida parapsilosis NOT DETECTED NOT DETECTED Final   Candida tropicalis NOT DETECTED NOT DETECTED Final   Cryptococcus neoformans/gattii NOT DETECTED NOT DETECTED Final   CTX-M ESBL NOT DETECTED NOT DETECTED Final   Carbapenem resistance IMP NOT DETECTED NOT DETECTED Final   Carbapenem resistance KPC NOT DETECTED NOT DETECTED Final   Carbapenem resistance NDM NOT DETECTED NOT DETECTED Final   Carbapenem resist OXA 48 LIKE NOT DETECTED NOT DETECTED Final   Carbapenem resistance VIM NOT DETECTED NOT DETECTED Final    Comment: Performed at [facility], [address] [place], [place] [address]  Resp Panel by RT-PCR (Flu A&B, Covid) Nasopharyngeal Swab     Status: None   Collection Time: 07/31/20  5:39 PM   Specimen: Nasopharyngeal Swab; Nasopharyngeal(NP) swabs in vial transport medium  Result Value Ref Range Status   SARS Coronavirus 2 by RT PCR NEGATIVE NEGATIVE Final    Comment: (NOTE) SARS-CoV-2 target nucleic acids are NOT DETECTED.  The SARS-CoV-2 RNA is generally detectable in upper respiratory specimens during the acute phase of infection. The lowest concentration of SARS-CoV-2 viral copies this assay can detect is 138 copies/mL. A negative result does not preclude SARS-Cov-2 infection and should not be used as the sole basis for treatment or other patient management decisions. A negative result may occur with  improper specimen collection/handling, submission of specimen other than nasopharyngeal swab, presence of viral mutation(s) within the areas  targeted by this assay, and inadequate number of  viral copies(<138 copies/mL). A negative result must be combined with clinical observations, patient history, and epidemiological information. The expected result is Negative.  Fact Sheet for Patients:  BloggerCourse.comhttps://www.fda.gov/media/152166/download  Fact Sheet for Healthcare Providers:  SeriousBroker.ithttps://www.fda.gov/media/152162/download  This test is no t yet approved or cleared by the [place] FDA and  has been authorized for detection and/or diagnosis of SARS-CoV-2 by FDA under an Emergency Use Authorization (EUA). This EUA will remain  in effect (meaning this test can be used) for the duration of the COVID-19 declaration under Section 564(b)(1) of the Act, 21 U.S.C.section 360bbb-3(b)(1), unless the authorization is terminated  or revoked sooner.       Influenza A by PCR NEGATIVE NEGATIVE Final   Influenza B by PCR NEGATIVE NEGATIVE Final    Comment: (NOTE) The Xpert Xpress SARS-CoV-2/FLU/RSV plus assay is intended as an aid in the diagnosis of influenza from Nasopharyngeal swab specimens and should not be used as a sole basis for treatment. Nasal washings and aspirates are unacceptable for Xpert Xpress SARS-CoV-2/FLU/RSV testing.  Fact Sheet for Patients: BloggerCourse.comhttps://www.fda.gov/media/152166/download  Fact Sheet for Healthcare Providers: SeriousBroker.ithttps://www.fda.gov/media/152162/download  This test is not yet approved or cleared by the [place] FDA and has been authorized for detection and/or diagnosis of SARS-CoV-2 by FDA under an Emergency Use Authorization (EUA). This EUA will remain in effect (meaning this test can be used) for the duration of the COVID-19 declaration under Section 564(b)(1) of the Act, 21 U.S.C. section 360bbb-3(b)(1), unless the authorization is terminated or revoked.  Performed at [facility], [address] [place], [place] [address]   Culture, blood (single)     Status: None   Collection Time: 07/31/20  8:23 PM   Specimen: BLOOD RIGHT WRIST  Result  Value Ref Range Status   Specimen Description BLOOD RIGHT WRIST  Final   Special Requests   Final    BOTTLES DRAWN AEROBIC AND ANAEROBIC Blood Culture results may not be optimal due to an excessive volume of blood received in culture bottles   Culture   Final    NO GROWTH 5 DAYS Performed at [facility], [address] [place], [place] [address]    Report Status 08/05/2020 FINAL  Final  Urine Culture     Status: Abnormal   Collection Time: 08/01/20 11:28 AM   Specimen: Urine, Clean Catch  Result Value Ref Range Status   Specimen Description   Final    URINE, CLEAN CATCH Performed at [facility], [address] [place], [place] [address]    Special Requests   Final    NONE Performed at [facility], [address] [place], [place] [address]    Culture (A)  Final    <10,000 COLONIES/mL INSIGNIFICANT GROWTH Performed at [facility], [address] [place], [place] [address]    Report Status 08/02/2020 FINAL  Final  MRSA PCR Screening     Status: None   Collection Time: 08/01/20  3:26 PM   Specimen: Nasopharyngeal  Result Value Ref Range Status   MRSA by PCR NEGATIVE NEGATIVE Final    Comment:        The GeneXpert MRSA Assay (FDA approved for NASAL specimens only), is one component of a comprehensive MRSA colonization surveillance program. It is not intended to diagnose MRSA infection nor to guide or monitor treatment for MRSA infections. Performed at [facility], [address] [place], [place] [address]   Aerobic/Anaerobic Culture (surgical/deep  wound)     Status: None   Collection Time: 08/01/20  7:37 PM   Specimen: Urine, Catheterized  Result Value Ref Range Status   Specimen Description   Final    URINE, CATHETERIZED URINE FROM DRAIN Performed at [facility], [address], [place], [place] [address]    Special Requests   Final    NONE Performed at [facility], [address]  [place], [place] [address]    Gram Stain   Final    ABUNDANT WBC PRESENT, PREDOMINANTLY PMN RARE GRAM NEGATIVE RODS RARE GRAM POSITIVE RODS    Culture   Final    FEW ESCHERICHIA COLI NO ANAEROBES ISOLATED Performed at [facility], [address], [place], [place] [address]    Report Status 08/07/2020 FINAL  Final   Organism ID, Bacteria ESCHERICHIA COLI  Final      Susceptibility   Escherichia coli - MIC*    AMPICILLIN 4 SENSITIVE Sensitive     CEFAZOLIN <=4 SENSITIVE Sensitive     CEFEPIME <=0.12 SENSITIVE Sensitive     CEFTRIAXONE <=0.25 SENSITIVE Sensitive     CIPROFLOXACIN <=0.25 SENSITIVE Sensitive     GENTAMICIN <=1 SENSITIVE Sensitive     IMIPENEM <=0.25 SENSITIVE Sensitive     NITROFURANTOIN <=16 SENSITIVE Sensitive     TRIMETH/SULFA <=20 SENSITIVE Sensitive     AMPICILLIN/SULBACTAM <=2 SENSITIVE Sensitive     PIP/TAZO <=4 SENSITIVE Sensitive     * FEW ESCHERICHIA COLI  Aerobic/Anaerobic Culture (surgical/deep wound)     Status: None   Collection Time: 08/01/20  7:38 PM   Specimen: Abscess  Result Value Ref Range Status   Specimen Description   Final    ABSCESS Performed at [facility], [address], [place], [place] [address]    Special Requests   Final    NONE Performed at [facility], [address], [place], [place] [address]    Gram Stain   Final    ABUNDANT WBC PRESENT, PREDOMINANTLY PMN MODERATE GRAM NEGATIVE RODS    Culture   Final    ABUNDANT ESCHERICHIA COLI NO ANAEROBES ISOLATED Performed at [facility], [address], [place], [place] [address]    Report Status 08/07/2020 FINAL  Final   Organism ID, Bacteria ESCHERICHIA COLI  Final      Susceptibility   Escherichia coli - MIC*    AMPICILLIN <=2 SENSITIVE Sensitive     CEFAZOLIN <=4 SENSITIVE Sensitive     CEFEPIME <=0.12 SENSITIVE Sensitive     CEFTAZIDIME <=1 SENSITIVE Sensitive     CEFTRIAXONE <=0.25 SENSITIVE Sensitive     CIPROFLOXACIN  <=0.25 SENSITIVE Sensitive     GENTAMICIN <=1 SENSITIVE Sensitive     IMIPENEM <=0.25 SENSITIVE Sensitive     TRIMETH/SULFA <=20 SENSITIVE Sensitive     AMPICILLIN/SULBACTAM <=2 SENSITIVE Sensitive     PIP/TAZO <=4 SENSITIVE Sensitive     * ABUNDANT ESCHERICHIA COLI  Aerobic/Anaerobic Culture (surgical/deep wound)     Status: None (Preliminary result)   Collection Time: 08/14/20  1:53 PM   Specimen: Abdomen; Abscess  Result Value Ref Range Status   Specimen Description   Final    ABDOMEN Performed at [facility], [address] [place], [place] [address]    Special Requests   Final    NONE Performed at [facility], [address] [place], [place] [address]    Gram Stain   Final  ABUNDANT WBC PRESENT, PREDOMINANTLY PMN NO ORGANISMS SEEN    Culture   Final    NO GROWTH < 12 HOURS Performed at [facility], [address] [place], [place] [address]    Report Status PENDING  Incomplete    Studies/Results: CT IMAGE GUIDED DRAINAGE BY PERCUTANEOUS CATHETER  Result Date: 08/14/2020 INDICATION: 60 year old female with history of emphysematous pyelonephritis with persistent right perinephric fluid collections status post nephrostomy tube placement hand left retroperitoneal drain placement. EXAM: CT IMAGE GUIDED DRAINAGE BY PERCUTANEOUS CATHETER COMPARISON:  None. MEDICATIONS: The patient is currently admitted to the hospital and receiving intravenous antibiotics. The antibiotics were administered within an appropriate time frame prior to the initiation of the procedure. ANESTHESIA/SEDATION: Moderate (conscious) sedation was employed during this procedure. A total of Versed 2 mg and Fentanyl 100 mcg was administered intravenously. Moderate Sedation Time: 20 minutes. The patient's level of consciousness and vital signs were monitored continuously by radiology nursing throughout the procedure under my direct supervision. CONTRAST:  None  COMPLICATIONS: None immediate. PROCEDURE: Informed written consent was obtained from the patient after a discussion of the risks, benefits and alternatives to treatment. The patient was placed in a partial right lateral decubitus position on the CT gantry and a pre procedural CT was performed re-demonstrating the known abscess/fluid collection within the left posterior perinephric space. The procedure was planned. A timeout was performed prior to the initiation of the procedure. The left flank was prepped and draped in the usual sterile fashion. The overlying soft tissues were anesthetized with 1% lidocaine with epinephrine. Appropriate trajectory was planned with the use of a 22 gauge spinal needle. An 18 gauge trocar needle was advanced into the abscess/fluid collection and a short Amplatz super stiff wire was coiled within the collection. Appropriate positioning was confirmed with a limited CT scan. The tract was serially dilated allowing placement of a 12 [place] all-purpose drainage catheter. Appropriate positioning was confirmed with a limited postprocedural CT scan. Approximately 100 ml of purulent fluid was aspirated. The tube was connected to a bulb suction and sutured in place. A dressing was placed. The patient tolerated the procedure well without immediate post procedural complication. IMPRESSION: Successful CT guided placement of a 36 French all purpose drain catheter into the left posterior perinephric abscess with aspiration of 100 mL of purulent fluid. Samples were sent to the laboratory as requested by the ordering clinical team. Marliss Coots, MD [facility] Electronically Signed   By: Marliss Coots MD   On: 08/14/2020 15:01    Assessment: Assessment: 1. Emphysematous pyelonephritis -  -WBC has normalized and clinically improving however perinephric drain still continues to put our purulent drainage and repeat CT showed larger  perinephric abscess collection/hemorhage which was drained yesterday. She is currently asymptomatic and pain is well controlled Please continue broad spectrum antibiotics -discussed with patient and husband overall survival of kidney unclear at this point in time.     Wilkie Aye

## 2020-08-16 ENCOUNTER — Inpatient Hospital Stay (HOSPITAL_COMMUNITY): Payer: Self-pay

## 2020-08-16 DIAGNOSIS — N151 Renal and perinephric abscess: Secondary | ICD-10-CM | POA: Diagnosis not present

## 2020-08-16 DIAGNOSIS — N10 Acute tubulo-interstitial nephritis: Secondary | ICD-10-CM | POA: Diagnosis not present

## 2020-08-16 LAB — GLUCOSE, CAPILLARY
Glucose-Capillary: 122 mg/dL — ABNORMAL HIGH (ref 70–99)
Glucose-Capillary: 117 mg/dL — ABNORMAL HIGH (ref 70–99)
Glucose-Capillary: 108 mg/dL — ABNORMAL HIGH (ref 70–99)
Glucose-Capillary: 143 mg/dL — ABNORMAL HIGH (ref 70–99)

## 2020-08-16 MED ORDER — CEFAZOLIN IV (FOR PTA / DISCHARGE USE ONLY): 2.0000 g | Freq: Three times a day (TID) | INTRAVENOUS | 0 refills | Status: AC

## 2020-08-16 NOTE — Progress Notes (Signed)
PROGRESS NOTE    Tammie [name]  IHK:[id number] DOB: Jul 08, 1961 DOA: 07/31/2020 PCP: Patient, No Pcp Per    Chief Complaint  Patient presents with  . Abdominal Pain    Brief Narrative:   Tammie [name] is a 60 y.o. female with medical history significant for hypertension, obesity, type II DM, who was admitted 12/24 with chief complaint of abdominal pain.  She was found to have acute pyelonephritis with left renal abscess, severe sepsis, acute kidney injury, hyper osmolar nonketotic hyperglycemia (initial blood sugar in the  than 700s), severe hyponatremia. Inthe ED:CT imaging showed multiple pulmonary nodules, imaging of abdomen pelvis With left hydronephrosis and evidence of perinephritic abscess.  She was transferred to vascular hospital for placement of left nephrostomy tube and left pelvic abscess drain.  She was initially admitted to the ICU where she was given 3% hypertonic saline infusion for severe hyponatremia.  She was also treated with empiric IV antibiotics for severe sepsis secondary to left pyelonephritis, left renal abscess and pelvic abscess.  Subjective:  She had a third drain placed on 1/6, denies significant pain, no fever   Assessment & Plan:   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 with type 2 diabetes mellitus (HCC)   Ileus (HCC)   Emphysematous pyelonephritis of left kidney   Pleural effusion  Severe sepsis with E. coli bacteremia/left emphysematouspyelonephritis with perinephric abscess and  retroperitoneal abscess -status post left nephrostomy andleftretroperitoneal/pelvic abscess drain placements12/24/21, IR following -Was on Rocephin now  Antibiotics have been deescalated to IV Ancef, right upper extremity PICC line placed on January 1, ID following -repeat ct scan 1/05 showed increased size of perirenal abscess, case discussed with urology and interventional radiology, -s/p third drain placement on 1/6  -repeat ct scan today on 1/8, Will follow IR and urology recommendation -WBC normalized, no fever, currently denies pain   Numerous pulmonary nodules, Suspect septic emboli, but cannot rule out malignancy - respiratory status stable, denies chest pain, denies short of breath, not on oxygen - nonocclusive thrombus forming in the left renal vein likely represents source of emboli. -As patient clinically improving urology recommending to continue current regimen of IV antibiotics,  -IDfollowing  -Per pulmonary patient will need repeat CT chest done in approximately 6 weeks.   -Bibasilar crackles/moderatebilateral pleural effusion She had received IV Lasix on 08/09/2020 and 08/10/2020.Marland Kitchen   AKI without known history of CKD/anemia of chronic disease - BUN 112/creatinine 4.19 on presentation, BUN 20/creatinine 0.84 today - Likely multifactorial secondary to emphysematous pyelonephritis with abscess, ischemic ATN in the setting of volume depletion. - Status post percutaneous nephrostomy and perirenal abscess drainage. -Foley catheter in place, d/c foley, discussed with urology -Urology following.     Questionable ileus, resolving -Abdominal films obtained concerning for possible ileus. CT abdomen and pelvis obtained negative for ileus. -Continue to advance diet as tolerated -Encourage ambulation, having BM  Hypokalemia/hypomagnesemia K normalized, mag 1.3 today, will give IV mag  Newly diagnosed diabetes mellitus type 2/HHNK Hemoglobin A1c noted  at 12.2.  Continue  Lantus 8 units twice daily. Sliding scale insulin. Blood sugars have been stable. Diabetic coordinator following.  A.m. glucose 115  Severe hyponatremia, resolved Sodium less than 102 on presentation Etiology is unclear.  Initially required treatment with hypertonic saline in the ICU. Sodium has since normalized  Body mass index is 32.3 kg/m.  ` Consultants:  Intensivist  Urologist  Interventional radiologist  General surgeon  Nephrologist  Infectious disease  Procedures:  CT-guided placement of left nephrostomy tube and left pelvic abscess drain on 08/01/2020  PICC line placement on January 1 CT guided left perinephric abscess drain placement on 1/6   DVT prophylaxis: SCDs   Code Status: Full Family Communication: patient Disposition:   Status is: Inpatient   Dispo: The patient is from: Home              Anticipated d/c is to: Home with home health              Anticipated d/c date [id number] tomorrow, Need urology clearance                  Antimicrobials:   Anti-infectives (From admission, onward)   Start     Dose/Rate Route Frequency Ordered Stop   08/16/20 0000  ceFAZolin (ANCEF) IVPB        2 g Intravenous Every 8 hours 08/16/20 0929 09/26/20 2359   08/05/20 1000  ceFAZolin (ANCEF) IVPB 2g/100 mL premix        2 g 200 mL/hr over 30 Minutes Intravenous Every 8 hours 08/05/20 0903     08/04/20 2200  ceFAZolin (ANCEF) IVPB 2g/100 mL premix  Status:  Discontinued        2 g 200 mL/hr over 30 Minutes Intravenous Every 12 hours 08/04/20 1131 08/05/20 0903   08/02/20 0000  piperacillin-tazobactam (ZOSYN) IVPB 3.375 g  Status:  Discontinued        3.375 g 12.5 mL/hr over 240 Minutes Intravenous Every 12 hours 08/01/20 1403 08/01/20 2006   08/01/20 2100  cefTRIAXone (ROCEPHIN) 2 g in sodium chloride 0.9 % 100 mL IVPB  Status:  Discontinued        2 g 200 mL/hr over 30 Minutes Intravenous Every 24 hours 08/01/20 2006  08/04/20 1131   08/01/20 1430  piperacillin-tazobactam (ZOSYN) IVPB 3.375 g        3.375 g 100 mL/hr over 30 Minutes Intravenous  Once 08/01/20 1355 08/01/20 1502   07/31/20 2000  cefTRIAXone (ROCEPHIN) 2 g in sodium chloride 0.9 % 100 mL IVPB  Status:  Discontinued        2 g 200 mL/hr over 30 Minutes Intravenous Every 24 hours 07/31/20 1947 08/01/20 1351   07/31/20 2000  azithromycin (ZITHROMAX) 500 mg in sodium chloride 0.9 % 250 mL IVPB  Status:  Discontinued        500 mg 250 mL/hr over 60 Minutes Intravenous Every 24 hours 07/31/20 1947 08/01/20 1351          Objective: Vitals:   08/15/20 1141 08/15/20 2126 08/16/20 0649 08/16/20 1319  BP: 140/71 (!) 152/80 (!) 146/83 (!) 149/81  Pulse: 96 (!) 104 90 92  Resp: (!) 25 20 16 16   Temp: 98.9 F (37.2 C) 99.1 F (37.3 C) 98.2 F (36.8 C) 98.7 F (37.1 C)  TempSrc: Oral Oral Oral Oral  SpO2: 95% 95% 95% 95%  Weight:      Height:        Intake/Output Summary (Last 24 hours) at 08/16/2020 1817 Last data  filed at 08/16/2020 1651 Gross per 24 hour  Intake 640 ml  Output 2541 ml  Net -1901 ml   Filed Weights   08/12/20 0500 08/13/20 0428 08/15/20 0548  Weight: 78.6 kg 80.1 kg 77.6 kg    Examination:  General exam: calm, NAD, +3 drains on left side, + indwelling Foley catheter Respiratory system: Clear to auscultation. Respiratory effort normal. Cardiovascular system: S1 & S2 heard, RRR.  No pedal edema. Gastrointestinal system: Abdomen is nondistended, soft and nontender.  Normal bowel sounds heard. Central nervous system: Alert and oriented. No focal neurological deficits. Extremities: Symmetric 5 x 5 power. Skin: No rashes, lesions or ulcers Psychiatry: Judgement and insight appear normal. Mood & affect appropriate.     Data Reviewed: I have personally reviewed following labs and imaging studies  CBC: Recent Labs  Lab 08/10/20 0522 08/11/20 1156 08/12/20 0321 08/13/20 0302 08/15/20 0416  WBC 10.0 6.9 6.2  5.5 6.2  NEUTROABS 8.1* 5.0 4.4  --  4.0  HGB 9.0* 8.7* 8.7* 8.3* 8.8*  HCT 28.3* 27.4* 27.8* 26.2* 28.0*  MCV 94.6 95.1 96.5 97.4 97.2  PLT 296 268 283 311 368    Basic Metabolic Panel: Recent Labs  Lab 08/10/20 0522 08/11/20 1156 08/12/20 0321 08/13/20 0302 08/14/20 0327 08/15/20 0416  NA 139 140 140 139 142 139  K 3.0* 3.6 3.9 4.2 4.2 4.1  CL 103 104 106 106 110 107  CO2 27 25 25 24 23 24   GLUCOSE 132* 193* 127* 115* 113* 120*  BUN 20 16 19 20 17 13   CREATININE 0.91 0.92 0.94 0.84 0.94 0.80  CALCIUM 8.0* 8.1* 8.2* 8.1* 8.4* 8.1*  MG 2.1 1.3* 1.7  --  1.3* 2.3  PHOS 2.9  --   --   --   --   --     GFR: Estimated Creatinine Clearance: 73 mL/min (by C-G formula based on SCr of 0.8 mg/dL).  Liver Function Tests: Recent Labs  Lab 08/10/20 0522  AST 22  ALT 11  ALKPHOS 154*  BILITOT 0.5  PROT 5.2*  ALBUMIN 2.2*    CBG: Recent Labs  Lab 08/15/20 1609 08/15/20 2123 08/16/20 0733 08/16/20 1200 08/16/20 1722  GLUCAP 101* 149* 122* 117* 108*     Recent Results (from the past 240 hour(s))  Aerobic/Anaerobic Culture (surgical/deep wound)     Status: None (Preliminary result)   Collection Time: 08/14/20  1:53 PM   Specimen: Abdomen; Abscess  Result Value Ref Range Status   Specimen Description   Final    ABDOMEN Performed at [facility], [address] [place], [place] [address]    Special Requests   Final    NONE Performed at [facility], [address] [place], [place] [address]    Gram Stain   Final    ABUNDANT WBC PRESENT, PREDOMINANTLY PMN NO ORGANISMS SEEN    Culture   Final    NO GROWTH 2 DAYS Performed at [facility], [address] [place], [place] [address]    Report Status PENDING  Incomplete         Radiology Studies: No results found.      Scheduled Meds: . Chlorhexidine Gluconate Cloth  6 each Topical Daily  . feeding supplement (GLUCERNA SHAKE)  237 mL Oral BID BM  .  insulin aspart  0-20 Units Subcutaneous TID WC  . insulin aspart  0-5 Units Subcutaneous QHS  . insulin glargine  8 Units Subcutaneous BID  .  potassium chloride  40 mEq Oral BID  . sodium chloride flush  5 mL Intracatheter Q8H  . sodium chloride flush  5 mL Intracatheter Q8H   Continuous Infusions: . sodium chloride 250 mL (08/12/20 2301)  .  ceFAZolin (ANCEF) IV 2 g (08/16/20 1509)  . sodium chloride       LOS: 16 days   Time spent: , case discussed with  urology over the phone Greater than 50% of this time was spent in counseling, explanation of diagnosis, planning of further management, and coordination of care.  I have personally reviewed and interpreted on  08/16/2020 daily labs,  I reviewed all nursing notes, pharmacy notes, consultant notes,  vitals, pertinent old records  I have discussed plan of care as described above with RN , patient  on 08/16/2020  Voice Recognition /Dragon dictation system was used to create this note, attempts have been made to correct errors. Please contact the author with questions and/or clarifications.   Albertine Grates, MD PhD FACP [facility]  Available via Epic secure chat 7am-7pm for nonurgent issues Please page for urgent issues To page the attending provider between 7A-7P or the covering provider during after hours 7P-7A, please log into the web site [email or web address] and access using universal [place] password for that web site. If you do not have the password, please call the hospital operator.    08/16/2020, 6:17 PM

## 2020-08-16 NOTE — Progress Notes (Signed)
Urology Inpatient Progress Report          Intv/Subj: No acute events overnight. . scant purulent drainage from new drain. 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 0.9 %  sodium chloride infusion   Intravenous PRN Lurene ShadowAyiku, Bernard, MD 10 mL/hr at 08/12/20 2301 250 mL at 08/12/20 2301  . acetaminophen (TYLENOL) tablet 650 mg  650 mg Oral Q4H PRN Rodolph Bonghompson, Daniel V, MD   650 mg at 08/14/20 2106  . albuterol (PROVENTIL) (2.5 MG/3ML) 0.083% nebulizer solution 2.5 mg  2.5 mg Nebulization Q4H PRN Simonne MartinetBabcock, Peter E, NP      . ceFAZolin (ANCEF) IVPB 2g/100 mL premix  2 g Intravenous Q8H Simonne MartinetBabcock, Peter E, NP 200 mL/hr at 08/16/20 1509 2 g at 08/16/20 1509  . Chlorhexidine Gluconate Cloth 2 % PADS 6 each  6 each Topical Daily Simonne MartinetBabcock, Peter E, NP   6 each at 08/16/20 [phone]  . dextrose 50 % solution 0-50 mL  0-50 mL Intravenous PRN Simonne MartinetBabcock, Peter E, NP      . feeding supplement (GLUCERNA SHAKE) (GLUCERNA SHAKE) liquid 237 mL  237 mL Oral BID BM Rodolph Bonghompson, Daniel V, MD   237 mL at 08/13/20 0908  . fentaNYL (SUBLIMAZE) injection   Intravenous PRN Bennie DallasSuttle, Dylan J, MD   50 mcg at 08/14/20 1322  . insulin aspart (novoLOG) injection 0-20 Units  0-20 Units Subcutaneous TID WC Simonne MartinetBabcock, Peter E, NP   3  Units at 08/16/20 0915  . insulin aspart (novoLOG) injection 0-5 Units  0-5 Units Subcutaneous QHS Simonne MartinetBabcock, Peter E, NP      . insulin glargine (LANTUS) injection 8 Units  8 Units Subcutaneous BID Rodolph Bonghompson, Daniel V, MD   8 Units at 08/16/20 [phone]  . midazolam (VERSED) injection   Intravenous PRN Bennie DallasSuttle, Dylan J, MD   1 mg at 08/14/20 1323  . ondansetron (ZOFRAN) tablet 4 mg  4 mg Oral Q6H PRN Simonne MartinetBabcock, Peter E, NP       Or  . ondansetron (ZOFRAN) injection 4 mg  4 mg Intravenous Q6H PRN Simonne MartinetBabcock, Peter E, NP      . polyvinyl alcohol (LIQUIFILM TEARS) 1.4 % ophthalmic solution 1 drop  1 drop Both Eyes PRN Rodolph Bonghompson, Daniel V, MD      . potassium chloride SA (KLOR-CON) CR tablet 40 mEq  40 mEq Oral BID Marlin CanaryVann, Jessica U, DO   40 mEq at 08/16/20 0919  . sodium chloride 0.9 % bolus 1,588 mL  20 mL/kg Intravenous Once Zenia ResidesBabcock, Peter E, NP      . sodium chloride flush (NS) 0.9 % injection 10-40 mL  10-40 mL Intracatheter PRN Kasandra KnudsenPace, Maryellen D, MD   10 mL at 08/13/20 1724  . sodium chloride flush (NS) 0.9 % injection 5 mL  5 mL Intracatheter Q8H Simonne Martinet, NP   5 mL at 08/16/20 0612  . sodium chloride flush (NS) 0.9 % injection 5 mL  5 mL Intracatheter Q8H Suttle, Dylan J, MD   5 mL at 08/16/20 0612     Objective: Vital: Vitals:   08/15/20 1141 08/15/20 2126 08/16/20 0649 08/16/20 1319  BP: 140/71 (!) 152/80 (!) 146/83 (!) 149/81  Pulse: 96 (!) 104 90 92  Resp: (!) 25 20 16 16   Temp: 98.9 F (37.2 C) 99.1 F (37.3 C) 98.2 F (36.8 C) 98.7 F (37.1 C)  TempSrc: Oral Oral Oral Oral  SpO2: 95% 95% 95% 95%  Weight:      Height:       I/Os: I/O last 3 completed shifts: In: 1720 [P.O.:1200; Other:20; IV Piggyback:500] Out: 1610 [RUEAV:4098; Drains:225]  Physical Exam:  General: Patient is in no apparent distress Lungs: Normal respiratory effort, chest expands symmetrically. GI: The abdomen is soft and nontender  L PCN tube: draining yellow urine with debris L perinephric drain:  draining blood and purulent drainage Foley: draining clear yellow urine Ext: lower extremities symmetric  Lab Results: Recent Labs    08/15/20 0416  WBC 6.2  HGB 8.8*  HCT 28.0*   Recent Labs    08/14/20 0327 08/15/20 0416  NA 142 139  K 4.2 4.1  CL 110 107  CO2 23 24  GLUCOSE 113* 120*  BUN 17 13  CREATININE 0.94 0.80  CALCIUM 8.4* 8.1*   Recent Labs    08/14/20 0327  INR 1.0   No results for input(s): LABURIN in the last 72 hours. Results for orders placed or performed during the hospital encounter of 07/31/20  Culture, blood (single)     Status: Abnormal   Collection Time: 07/31/20  5:26 PM   Specimen: BLOOD  Result Value Ref Range Status   Specimen Description   Final    BLOOD LEFT ANTECUBITAL Performed at [facility], [address] [place], [place] [address]    Special Requests   Final    BOTTLES DRAWN AEROBIC AND ANAEROBIC Blood Culture adequate volume Performed at [facility], [address] [place], [place] [address]    Culture  Setup Time   Final    GRAM NEGATIVE RODS AEROBIC AND ANEROBIC BOTTLES Gram Stain Report Called to,Read Back By and Verified With: LARIMORE, A. 12/24 @ 0740 BEARD, S.  CRITICAL RESULT CALLED TO, READ BACK BY AND VERIFIED WITH: PHARMD  Lucita Lora [id number] 1921 FCP Performed at [facility], [address], [place], [place] [address]    Culture ESCHERICHIA COLI (A)  Final   Report Status 08/03/2020 FINAL  Final   Organism ID, Bacteria ESCHERICHIA COLI  Final      Susceptibility   Escherichia coli - MIC*    AMPICILLIN <=2 SENSITIVE Sensitive     CEFAZOLIN <=4 SENSITIVE Sensitive     CEFEPIME <=0.12 SENSITIVE Sensitive     CEFTAZIDIME <=1 SENSITIVE Sensitive     CEFTRIAXONE <=0.25 SENSITIVE Sensitive     CIPROFLOXACIN <=0.25 SENSITIVE Sensitive     GENTAMICIN <=1 SENSITIVE Sensitive     IMIPENEM <=0.25 SENSITIVE Sensitive     TRIMETH/SULFA <=20 SENSITIVE Sensitive     AMPICILLIN/SULBACTAM <=2 SENSITIVE Sensitive      PIP/TAZO <=4 SENSITIVE Sensitive     * ESCHERICHIA COLI  Blood Culture ID Panel (Reflexed)     Status: Abnormal   Collection Time: 07/31/20  5:26 PM  Result Value Ref  Range Status   Enterococcus faecalis NOT DETECTED NOT DETECTED Final   Enterococcus Faecium NOT DETECTED NOT DETECTED Final   Listeria monocytogenes NOT DETECTED NOT DETECTED Final   Staphylococcus species NOT DETECTED NOT DETECTED Final   Staphylococcus aureus (BCID) NOT DETECTED NOT DETECTED Final   Staphylococcus epidermidis NOT DETECTED NOT DETECTED Final   Staphylococcus lugdunensis NOT DETECTED NOT DETECTED Final   Streptococcus species NOT DETECTED NOT DETECTED Final   Streptococcus agalactiae NOT DETECTED NOT DETECTED Final   Streptococcus pneumoniae NOT DETECTED NOT DETECTED Final   Streptococcus pyogenes NOT DETECTED NOT DETECTED Final   A.calcoaceticus-baumannii NOT DETECTED NOT DETECTED Final   Bacteroides fragilis NOT DETECTED NOT DETECTED Final   Enterobacterales DETECTED (A) NOT DETECTED Final    Comment: Enterobacterales represent a large order of gram negative bacteria, not a single organism. CRITICAL RESULT CALLED TO, READ BACK BY AND VERIFIED WITH: PHARMD  TERRY G. [id number] 1921 FCP    Enterobacter cloacae complex NOT DETECTED NOT DETECTED Final   Escherichia coli DETECTED (A) NOT DETECTED Final    Comment: CRITICAL RESULT CALLED TO, READ BACK BY AND VERIFIED WITH: PHARMD  TERRY G. [id number] 1921 FCP    Klebsiella aerogenes NOT DETECTED NOT DETECTED Final   Klebsiella oxytoca NOT DETECTED NOT DETECTED Final   Klebsiella pneumoniae NOT DETECTED NOT DETECTED Final   Proteus species NOT DETECTED NOT DETECTED Final   Salmonella species NOT DETECTED NOT DETECTED Final   Serratia marcescens NOT DETECTED NOT DETECTED Final   Haemophilus influenzae NOT DETECTED NOT DETECTED Final   Neisseria meningitidis NOT DETECTED NOT DETECTED Final   Pseudomonas aeruginosa NOT DETECTED NOT DETECTED Final   Stenotrophomonas  maltophilia NOT DETECTED NOT DETECTED Final   Candida albicans NOT DETECTED NOT DETECTED Final   Candida auris NOT DETECTED NOT DETECTED Final   Candida glabrata NOT DETECTED NOT DETECTED Final   Candida krusei NOT DETECTED NOT DETECTED Final   Candida parapsilosis NOT DETECTED NOT DETECTED Final   Candida tropicalis NOT DETECTED NOT DETECTED Final   Cryptococcus neoformans/gattii NOT DETECTED NOT DETECTED Final   CTX-M ESBL NOT DETECTED NOT DETECTED Final   Carbapenem resistance IMP NOT DETECTED NOT DETECTED Final   Carbapenem resistance KPC NOT DETECTED NOT DETECTED Final   Carbapenem resistance NDM NOT DETECTED NOT DETECTED Final   Carbapenem resist OXA 48 LIKE NOT DETECTED NOT DETECTED Final   Carbapenem resistance VIM NOT DETECTED NOT DETECTED Final    Comment: Performed at [facility], [address] [place], [place] [address]  Resp Panel by RT-PCR (Flu A&B, Covid) Nasopharyngeal Swab     Status: None   Collection Time: 07/31/20  5:39 PM   Specimen: Nasopharyngeal Swab; Nasopharyngeal(NP) swabs in vial transport medium  Result Value Ref Range Status   SARS Coronavirus 2 by RT PCR NEGATIVE NEGATIVE Final    Comment: (NOTE) SARS-CoV-2 target nucleic acids are NOT DETECTED.  The SARS-CoV-2 RNA is generally detectable in upper respiratory specimens during the acute phase of infection. The lowest concentration of SARS-CoV-2 viral copies this assay can detect is 138 copies/mL. A negative result does not preclude SARS-Cov-2 infection and should not be used as the sole basis for treatment or other patient management decisions. A negative result may occur with  improper specimen collection/handling, submission of specimen other than nasopharyngeal swab, presence of viral mutation(s) within the areas targeted by this assay, and inadequate number of viral copies(<138 copies/mL). A negative result must be combined with clinical observations, patient history,  and  epidemiological information. The expected result is Negative.  Fact Sheet for Patients:  BloggerCourse.comhttps://www.fda.gov/media/152166/download  Fact Sheet for Healthcare Providers:  SeriousBroker.ithttps://www.fda.gov/media/152162/download  This test is no t yet approved or cleared by the [place] FDA and  has been authorized for detection and/or diagnosis of SARS-CoV-2 by FDA under an Emergency Use Authorization (EUA). This EUA will remain  in effect (meaning this test can be used) for the duration of the COVID-19 declaration under Section 564(b)(1) of the Act, 21 U.S.C.section 360bbb-3(b)(1), unless the authorization is terminated  or revoked sooner.       Influenza A by PCR NEGATIVE NEGATIVE Final   Influenza B by PCR NEGATIVE NEGATIVE Final    Comment: (NOTE) The Xpert Xpress SARS-CoV-2/FLU/RSV plus assay is intended as an aid in the diagnosis of influenza from Nasopharyngeal swab specimens and should not be used as a sole basis for treatment. Nasal washings and aspirates are unacceptable for Xpert Xpress SARS-CoV-2/FLU/RSV testing.  Fact Sheet for Patients: BloggerCourse.comhttps://www.fda.gov/media/152166/download  Fact Sheet for Healthcare Providers: SeriousBroker.ithttps://www.fda.gov/media/152162/download  This test is not yet approved or cleared by the [place] FDA and has been authorized for detection and/or diagnosis of SARS-CoV-2 by FDA under an Emergency Use Authorization (EUA). This EUA will remain in effect (meaning this test can be used) for the duration of the COVID-19 declaration under Section 564(b)(1) of the Act, 21 U.S.C. section 360bbb-3(b)(1), unless the authorization is terminated or revoked.  Performed at [facility], [address] [place], [place] [address]   Culture, blood (single)     Status: None   Collection Time: 07/31/20  8:23 PM   Specimen: BLOOD RIGHT WRIST  Result Value Ref Range Status   Specimen Description BLOOD RIGHT WRIST  Final   Special Requests   Final    BOTTLES DRAWN  AEROBIC AND ANAEROBIC Blood Culture results may not be optimal due to an excessive volume of blood received in culture bottles   Culture   Final    NO GROWTH 5 DAYS Performed at [facility], [address] [place], [place] [address]    Report Status 08/05/2020 FINAL  Final  Urine Culture     Status: Abnormal   Collection Time: 08/01/20 11:28 AM   Specimen: Urine, Clean Catch  Result Value Ref Range Status   Specimen Description   Final    URINE, CLEAN CATCH Performed at [facility], [address] [place], [place] [address]    Special Requests   Final    NONE Performed at [facility], [address] [place], [place] [address]    Culture (A)  Final    <10,000 COLONIES/mL INSIGNIFICANT GROWTH Performed at [facility], [address] [place], [place] [address]    Report Status 08/02/2020 FINAL  Final  MRSA PCR Screening     Status: None   Collection Time: 08/01/20  3:26 PM   Specimen: Nasopharyngeal  Result Value Ref Range Status   MRSA by PCR NEGATIVE NEGATIVE Final    Comment:        The GeneXpert MRSA Assay (FDA approved for NASAL specimens only), is one component of a comprehensive MRSA colonization surveillance program. It is not intended to diagnose MRSA infection nor to guide or monitor treatment for MRSA infections. Performed at [facility], [address] [place], [place] [address]   Aerobic/Anaerobic Culture (surgical/deep wound)     Status: None   Collection Time: 08/01/20  7:37 PM   Specimen: Urine, Catheterized  Result  Value Ref Range Status   Specimen Description   Final    URINE, CATHETERIZED URINE FROM DRAIN Performed at [facility], [address] [place], [place] [address]    Special Requests   Final    NONE Performed at [facility], [address] [place], [place] [address]    Gram Stain   Final    ABUNDANT WBC PRESENT, PREDOMINANTLY PMN RARE GRAM NEGATIVE RODS RARE GRAM  POSITIVE RODS    Culture   Final    FEW ESCHERICHIA COLI NO ANAEROBES ISOLATED Performed at [facility], [address], [place], [place] [address]    Report Status 08/07/2020 FINAL  Final   Organism ID, Bacteria ESCHERICHIA COLI  Final      Susceptibility   Escherichia coli - MIC*    AMPICILLIN 4 SENSITIVE Sensitive     CEFAZOLIN <=4 SENSITIVE Sensitive     CEFEPIME <=0.12 SENSITIVE Sensitive     CEFTRIAXONE <=0.25 SENSITIVE Sensitive     CIPROFLOXACIN <=0.25 SENSITIVE Sensitive     GENTAMICIN <=1 SENSITIVE Sensitive     IMIPENEM <=0.25 SENSITIVE Sensitive     NITROFURANTOIN <=16 SENSITIVE Sensitive     TRIMETH/SULFA <=20 SENSITIVE Sensitive     AMPICILLIN/SULBACTAM <=2 SENSITIVE Sensitive     PIP/TAZO <=4 SENSITIVE Sensitive     * FEW ESCHERICHIA COLI  Aerobic/Anaerobic Culture (surgical/deep wound)     Status: None   Collection Time: 08/01/20  7:38 PM   Specimen: Abscess  Result Value Ref Range Status   Specimen Description   Final    ABSCESS Performed at [facility], [address] [place], [place] [address]    Special Requests   Final    NONE Performed at [facility], [address], [place], [place] [address]    Gram Stain   Final    ABUNDANT WBC PRESENT, PREDOMINANTLY PMN MODERATE GRAM NEGATIVE RODS    Culture   Final    ABUNDANT ESCHERICHIA COLI NO ANAEROBES ISOLATED Performed at [facility], [address] [place], [place] [address]    Report Status 08/07/2020 FINAL  Final   Organism ID, Bacteria ESCHERICHIA COLI  Final      Susceptibility   Escherichia coli - MIC*    AMPICILLIN <=2 SENSITIVE Sensitive     CEFAZOLIN <=4 SENSITIVE Sensitive     CEFEPIME <=0.12 SENSITIVE Sensitive     CEFTAZIDIME <=1 SENSITIVE Sensitive     CEFTRIAXONE <=0.25 SENSITIVE Sensitive     CIPROFLOXACIN <=0.25 SENSITIVE Sensitive     GENTAMICIN <=1 SENSITIVE Sensitive     IMIPENEM <=0.25 SENSITIVE Sensitive      TRIMETH/SULFA <=20 SENSITIVE Sensitive     AMPICILLIN/SULBACTAM <=2 SENSITIVE Sensitive     PIP/TAZO <=4 SENSITIVE Sensitive     * ABUNDANT ESCHERICHIA COLI  Aerobic/Anaerobic Culture (surgical/deep wound)     Status: None (Preliminary result)   Collection Time: 08/14/20  1:53 PM   Specimen: Abdomen; Abscess  Result Value Ref Range Status   Specimen Description   Final    ABDOMEN Performed at [facility], [address], [place], [place] [address]    Special Requests   Final    NONE Performed at [facility], [address] [place] [address], [place], [place] [address]    Gram Stain   Final    ABUNDANT WBC PRESENT, PREDOMINANTLY PMN NO ORGANISMS SEEN    Culture   Final    NO  GROWTH 2 DAYS Performed at [facility], [address], [place], [place] [address]    Report Status PENDING  Incomplete    Studies/Results: No results found.  Assessment: Assessment: 1. Emphysematous pyelonephritis -  -WBC has normalized and clinically improving however perinephric drain still continues to put our purulent drainage and repeat CT showed larger perinephric abscess collection/hemorhage which was drained yesterday. She is currently asymptomatic and pain is well controlled Please continue broad spectrum antibiotics and we will obtain repeat Ct imaging prior to discharge     Wilkie Aye

## 2020-08-17 DIAGNOSIS — N10 Acute tubulo-interstitial nephritis: Secondary | ICD-10-CM | POA: Diagnosis not present

## 2020-08-17 DIAGNOSIS — N151 Renal and perinephric abscess: Secondary | ICD-10-CM | POA: Diagnosis not present

## 2020-08-17 DIAGNOSIS — Z936 Other artificial openings of urinary tract status: Secondary | ICD-10-CM | POA: Diagnosis not present

## 2020-08-17 DIAGNOSIS — Z978 Presence of other specified devices: Secondary | ICD-10-CM | POA: Diagnosis not present

## 2020-08-17 LAB — GLUCOSE, CAPILLARY
Glucose-Capillary: 122 mg/dL — ABNORMAL HIGH (ref 70–99)
Glucose-Capillary: 111 mg/dL — ABNORMAL HIGH (ref 70–99)
Glucose-Capillary: 122 mg/dL — ABNORMAL HIGH (ref 70–99)
Glucose-Capillary: 132 mg/dL — ABNORMAL HIGH (ref 70–99)

## 2020-08-17 NOTE — Progress Notes (Signed)
Referring Physician(s): Pace,M  Supervising Physician: Ruel Favors  Patient Status:  [facility] - In-pt  Chief Complaint:  Leftemphysematous pyelonephritis/retroperitoneal/perinephricabscess s/p drain placement x3  Subjective: Patient without new complaints.  For possible discharge today vs. Tomorrow.  Husband at bedside.  All questions answered.   Allergies: Codeine  Medications: Prior to Admission medications   Medication Sig Start Date End Date Taking? Authorizing Provider  acetaminophen (TYLENOL) 500 MG tablet Take 1,000 mg by mouth every 6 (six) hours as needed.   Yes [provider]  Pseudoeph-Doxylamine-DM-APAP (NYQUIL PO) Take 1 tablet by mouth daily as needed (congestion).   Yes [provider]     Vital Signs: BP 132/75 (BP Location: Left Arm)   Pulse 88   Temp 98.4 F (36.9 C) (Oral)   Resp 16   Ht 5\' 2"  (1.575 m)   Wt 171 lb 1.2 oz (77.6 kg)   LMP  (LMP Unknown)   SpO2 93%   BMI 31.29 kg/m   Physical Exam awake, alert.  Abdomen/Back: Left nephrostomy/perinephric and pelvic/retroperitoneal drains intact, insertion sites okay ,non-tender, still with significant purulent-appearing output.   Imaging: CT RENAL STONE STUDY  Result Date: 08/16/2020 CLINICAL DATA:  60 year old female with pyelonephritis and renal abscess. Follow-up CT. EXAM: CT ABDOMEN AND PELVIS WITHOUT CONTRAST TECHNIQUE: Multidetector CT imaging of the abdomen and pelvis was performed following the standard protocol without IV contrast. COMPARISON:  CT abdomen pelvis dated 08/13/2020. FINDINGS: Evaluation of this exam is limited in the absence of intravenous contrast. Lower chest: Partially visualized small bilateral pleural effusions and consolidative changes of the lung bases as seen previously. Several pulmonary nodules measure 7 mm in the right middle lobe. No intra-abdominal free air or free fluid. Hepatobiliary: No focal liver abnormality is seen. No gallstones,  gallbladder wall thickening, or biliary dilatation. Pancreas: Unremarkable. No pancreatic ductal dilatation or surrounding inflammatory changes. Spleen: Normal in size without focal abnormality. Adrenals/Urinary Tract: The adrenal glands unremarkable. Left percutaneous nephrostomy with pigtail tip in the left renal pelvis in similar position. There has been interval placement a percutaneous drainage catheter with pigtail tip in the posterior perinephric collection. Slight interval decrease in the size of the perinephric collection compared to prior CT. There is no hydronephrosis or nephrolithiasis on either side. The right kidney is unremarkable. The urinary bladder is decompressed around a Foley catheter. Stomach/Bowel: There is no bowel obstruction or active inflammation. The appendix is not visualized with certainty. No inflammatory changes identified in the right lower quadrant. Vascular/Lymphatic: Mild aortoiliac atherosclerotic disease. The IVC is unremarkable. No portal venous gas. There is no adenopathy. Reproductive: The uterus and ovaries are grossly unremarkable. Other: Mild diffuse subcutaneous edema. There has been interval placement of a percutaneous drainage catheter with tip in the fluid collection along the anterior aspect of the left psoas muscle. The collection has decreased in size now measuring approximately 4.2 x 8.0 cm (previously 4.4 x 8.7 cm). Musculoskeletal: No acute or significant osseous findings. IMPRESSION: 1. Interval placement of a percutaneous drainage catheter with tip in the posterior perinephric collection. Slight interval decrease in the size of the fluid collection compared to prior CT. 2. Decrease in the size of the collection anterior to the left psoas muscle status post percutaneous drainage catheter placement. Near 3. No hydronephrosis or nephrolithiasis. 4. No bowel obstruction. 5. Several pulmonary nodules measure 7 mm in the right middle lobe. 6. Aortic Atherosclerosis  (ICD10-I70.0). Electronically Signed   By: 10/11/2020 M.D.   On: 08/16/2020 20:27  CT IMAGE GUIDED DRAINAGE BY PERCUTANEOUS CATHETER  Result Date: 08/14/2020 INDICATION: 60 year old female with history of emphysematous pyelonephritis with persistent right perinephric fluid collections status post nephrostomy tube placement hand left retroperitoneal drain placement. EXAM: CT IMAGE GUIDED DRAINAGE BY PERCUTANEOUS CATHETER COMPARISON:  None. MEDICATIONS: The patient is currently admitted to the hospital and receiving intravenous antibiotics. The antibiotics were administered within an appropriate time frame prior to the initiation of the procedure. ANESTHESIA/SEDATION: Moderate (conscious) sedation was employed during this procedure. A total of Versed 2 mg and Fentanyl 100 mcg was administered intravenously. Moderate Sedation Time: 20 minutes. The patient's level of consciousness and vital signs were monitored continuously by radiology nursing throughout the procedure under my direct supervision. CONTRAST:  None COMPLICATIONS: None immediate. PROCEDURE: Informed written consent was obtained from the patient after a discussion of the risks, benefits and alternatives to treatment. The patient was placed in a partial right lateral decubitus position on the CT gantry and a pre procedural CT was performed re-demonstrating the known abscess/fluid collection within the left posterior perinephric space. The procedure was planned. A timeout was performed prior to the initiation of the procedure. The left flank was prepped and draped in the usual sterile fashion. The overlying soft tissues were anesthetized with 1% lidocaine with epinephrine. Appropriate trajectory was planned with the use of a 22 gauge spinal needle. An 18 gauge trocar needle was advanced into the abscess/fluid collection and a short Amplatz super stiff wire was coiled within the collection. Appropriate positioning was confirmed with a limited CT  scan. The tract was serially dilated allowing placement of a 12 [place] all-purpose drainage catheter. Appropriate positioning was confirmed with a limited postprocedural CT scan. Approximately 100 ml of purulent fluid was aspirated. The tube was connected to a bulb suction and sutured in place. A dressing was placed. The patient tolerated the procedure well without immediate post procedural complication. IMPRESSION: Successful CT guided placement of a 12 French all purpose drain catheter into the left posterior perinephric abscess with aspiration of 100 mL of purulent fluid. Samples were sent to the laboratory as requested by the ordering clinical team. Marliss Coots, MD [facility] Electronically Signed   By: Marliss Coots MD   On: 08/14/2020 15:01    Labs:  CBC: Recent Labs    08/11/20 1156 08/12/20 0321 08/13/20 0302 08/15/20 0416  WBC 6.9 6.2 5.5 6.2  HGB 8.7* 8.7* 8.3* 8.8*  HCT 27.4* 27.8* 26.2* 28.0*  PLT 268 283 311 368    COAGS: Recent Labs    08/14/20 0327  INR 1.0    BMP: Recent Labs    08/12/20 0321 08/13/20 0302 08/14/20 0327 08/15/20 0416  NA 140 139 142 139  K 3.9 4.2 4.2 4.1  CL 106 106 110 107  CO2 25 24 23 24   GLUCOSE 127* 115* 113* 120*  BUN 19 20 17 13   CALCIUM 8.2* 8.1* 8.4* 8.1*  CREATININE 0.94 0.84 0.94 0.80  GFRNONAA >60 >60 >60 >60    LIVER FUNCTION TESTS: Recent Labs    07/31/20 1913 08/01/20 0641 08/08/20 0823 08/09/20 0528 08/10/20 0522  BILITOT 1.3* 0.9  --  0.7 0.5  AST 14* 19  --  22 22  ALT 16 19  --  8 11  ALKPHOS 147* 142*  --  165* 154*  PROT 5.4* 5.4*  --  5.1* 5.2*  ALBUMIN 2.0* 2.1* 2.0* 2.0* 2.2*    Assessment and Plan: Patient with history of  left emphysematous pyelonephritis with associated retroperitoneal abscess, status post left nephrostomy andleftretroperitoneal/pelvic abscess drain placements12/24/21;s/p additional left posterior perinephric drain  placement 1/6 Afebrile WBC 6.2 All drains found intact today with ongoing output. Three drains flush easily.  Husband at bedside has been educated on drain care. Verbalizes understanding.  For possible d/c today.  Outpatient orders are in place.  They understand schedulers will call with date and time of appointment.   Electronically Signed: Hoyt Koch, PA 08/17/2020, 11:18 AM   I spent a total of 15 minutes at the the patient's bedside AND on the patient's hospital floor or unit, greater than 50% of which was counseling/coordinating care for left nephrostomy, left erinephric/retroperitoneal abscess drains

## 2020-08-17 NOTE — Progress Notes (Signed)
Instructed pt and husband on emptying and measuring output of all drains.  Pt and husband successfully demonstrated and verbalized understanding of drain care and emptying of drain of contents. Lizzeth Meder, Yancey Flemings, RN

## 2020-08-17 NOTE — Progress Notes (Signed)
PROGRESS NOTE    Pamlea Finder  GYK:[id number] DOB: 1960/12/17 DOA: 07/31/2020 PCP: Patient, No Pcp Per    Chief Complaint  Patient presents with  . Abdominal Pain    Brief Narrative:   Ghina Bittinger is a 60 y.o. female with medical history significant for hypertension, obesity, type II DM, who was admitted 12/24 with chief complaint of abdominal pain.  She was found to have acute pyelonephritis with left renal abscess, severe sepsis, acute kidney injury, hyper osmolar nonketotic hyperglycemia (initial blood sugar in the  than 700s), severe hyponatremia. Inthe ED:CT imaging showed multiple pulmonary nodules, imaging of abdomen pelvis With left hydronephrosis and evidence of perinephritic abscess.  She was transferred to vascular hospital for placement of left nephrostomy tube and left pelvic abscess drain.  She was initially admitted to the ICU where she was given 3% hypertonic saline infusion for severe hyponatremia.  She was also treated with empiric IV antibiotics for severe sepsis secondary to left pyelonephritis, left renal abscess and pelvic abscess.  Subjective:  She had a third drain placed on 1/6, denies significant pain, no fever IR at bedside flushing the drains Husband at bedside assisting   Assessment & Plan:   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 with type 2 diabetes mellitus (HCC)   Ileus (HCC)   Emphysematous pyelonephritis of left kidney   Pleural effusion  Severe sepsis with E. coli bacteremia/left  emphysematouspyelonephritis with perinephric abscess and retroperitoneal abscess -status post left nephrostomy andleftretroperitoneal/pelvic abscess drain placementsx212/24/21,  -Was on Rocephin nowAntibiotics have been deescalated to IV Ancef,  -right upper extremity PICC line placed on January 1, ID following -repeat ct scan 1/05 showed increased size of perirenal abscess,  -s/p third drain placement on 1/6 -WBC normalized, no fever, currently denies pain  -urology recommended repeat ct scan  on 1/8, per urology patient can go home today or tomorrow, will need repeat ct in one week, will need to follow up with urology Dr Arita Miss next week -will need to set up home health for IV infusion, home health order placed   AKI without known history of CKD/anemia of chronic disease - BUN 112/creatinine 4.19 on presentation, BUN 20/creatinine 0.84 today - Likely multifactorial secondary to emphysematous pyelonephritis with abscess, ischemic ATN in the setting of volume depletion. - Status post percutaneous nephrostomy and perirenal abscess drainage. -d/c foley, discussed with urology -Urology following.   Numerous pulmonary nodules Suspect septic emboli, but cannot rule out malignancy - respiratory status stable, denies chest pain, denies short of breath, not on oxygen - nonocclusive thrombus forming in the left renal vein likely represents source of emboli. -As patient clinically improving urology recommending to continue current regimen of IV antibiotics, IDfollowing  -Per pulmonary patient will need repeat CT chest done in approximately 6 weeks.   -Bibasilar crackles/moderatebilateral pleural effusion She had received IV Lasix on 08/09/2020 and 08/10/2020.Marland Kitchen  Newly diagnosed diabetes mellitus type 2/HHNK, present on admission Hemoglobin A1c noted at 12.2.  Continue Lantus 8 units twice daily. Sliding scale  insulin. Blood sugars have been stable.  Reconsult diabetes  coordinator,Reconsult dietitian for diabetic diet education ,as patient report she was confused initially could not remember education A.m. glucose 113-120 last 3 days Will need to go home on insulin, patient prefers insulin pens , glucometer/lancet/strips  Severe hyponatremia, resolved Sodium less than 102 on presentation Etiology is unclear.  Initially required treatment with hypertonic saline in the ICU. Sodium has since normalized   Hypokalemia/hypomagnesemia Replaced and normalized  Questionable ileus, resolved -Abdominal films obtained concerning for possible ileus. CT abdomen and pelvis obtained negative for ileus. -Tolerating regular diet -Encourage ambulation, having BM  Body mass index is 32.3 kg/m.  ` Consultants:  Intensivist  Urologist  Interventional radiologist  General surgeon  Nephrologist  Infectious disease  Procedures:  CT-guided placement of left nephrostomy tube and left pelvic abscess drain on 08/01/2020  PICC line placement on January 1 CT guided left perinephric abscess drain placement on 1/6   DVT prophylaxis: SCDs   Code Status: Full Family Communication: patient and husband at bedside Disposition:   Status is: Inpatient   Dispo: The patient is from: Home              Anticipated d/c is to: Home with home health, patient report does not have PCP, her previous PCP has retired, she will benefit to follow-up with endocrinology as well for newly diagnosed diabetes, she preferred to see endocrinology with [facility] in [place] , she would like hospital make referral and appointment for her on monday              Anticipated d/c date is: Likely on Monday                  Antimicrobials:   Anti-infectives (From admission, onward)   Start     Dose/Rate Route Frequency Ordered Stop   08/16/20 0000  ceFAZolin (ANCEF) IVPB        2 g Intravenous Every 8 hours 08/16/20 0929 09/26/20 2359   08/05/20 1000  ceFAZolin (ANCEF)  IVPB 2g/100 mL premix        2 g 200 mL/hr over 30 Minutes Intravenous Every 8 hours 08/05/20 0903     08/04/20 2200  ceFAZolin (ANCEF) IVPB 2g/100 mL premix  Status:  Discontinued        2 g 200 mL/hr over 30 Minutes Intravenous Every 12 hours 08/04/20 1131 08/05/20 0903   08/02/20 0000  piperacillin-tazobactam (ZOSYN) IVPB 3.375 g  Status:  Discontinued        3.375 g 12.5 mL/hr over 240 Minutes Intravenous Every 12 hours 08/01/20 1403 08/01/20 2006   08/01/20 2100  cefTRIAXone (ROCEPHIN) 2 g in sodium chloride 0.9 % 100 mL IVPB  Status:  Discontinued        2 g 200 mL/hr over 30 Minutes Intravenous Every 24 hours 08/01/20 2006 08/04/20 1131   08/01/20 1430  piperacillin-tazobactam (ZOSYN) IVPB 3.375 g        3.375 g 100 mL/hr over 30 Minutes Intravenous  Once 08/01/20 1355 08/01/20 1502   07/31/20 2000  cefTRIAXone (ROCEPHIN) 2 g in sodium chloride 0.9 % 100 mL IVPB  Status:  Discontinued        2 g 200 mL/hr over 30 Minutes Intravenous Every 24 hours 07/31/20 1947 08/01/20 1351   07/31/20 2000  azithromycin (ZITHROMAX) 500 mg in sodium chloride 0.9 % 250 mL IVPB  Status:  Discontinued        500 mg 250 mL/hr over 60  Minutes Intravenous Every 24 hours 07/31/20 1947 08/01/20 1351          Objective: Vitals:   08/16/20 1319 08/16/20 2037 08/17/20 0454 08/17/20 1304  BP: (!) 149/81 (!) 144/80 132/75 129/69  Pulse: 92 99 88 93  Resp: 16 16 16 16   Temp: 98.7 F (37.1 C) 98 F (36.7 C) 98.4 F (36.9 C) 98.6 F (37 C)  TempSrc: Oral  Oral Oral  SpO2: 95% 93% 93% 96%  Weight:      Height:        Intake/Output Summary (Last 24 hours) at 08/17/2020 1515 Last data filed at 08/17/2020 1138 Gross per 24 hour  Intake 470 ml  Output 3185 ml  Net -2715 ml   Filed Weights   08/12/20 0500 08/13/20 0428 08/15/20 0548  Weight: 78.6 kg 80.1 kg 77.6 kg    Examination:  General exam: calm, NAD, +3 drains on left side,  Respiratory system: Clear to auscultation. Respiratory effort  normal. Cardiovascular system: S1 & S2 heard, RRR.  No pedal edema. Gastrointestinal system: Abdomen is nondistended, soft and nontender.  Normal bowel sounds heard. Central nervous system: Alert and oriented. No focal neurological deficits. Extremities: Symmetric 5 x 5 power. Skin: No rashes, lesions or ulcers Psychiatry: Judgement and insight appear normal. Mood & affect appropriate.     Data Reviewed: I have personally reviewed following labs and imaging studies  CBC: Recent Labs  Lab 08/11/20 1156 08/12/20 0321 08/13/20 0302 08/15/20 0416  WBC 6.9 6.2 5.5 6.2  NEUTROABS 5.0 4.4  --  4.0  HGB 8.7* 8.7* 8.3* 8.8*  HCT 27.4* 27.8* 26.2* 28.0*  MCV 95.1 96.5 97.4 97.2  PLT 268 283 311 368    Basic Metabolic Panel: Recent Labs  Lab 08/11/20 1156 08/12/20 0321 08/13/20 0302 08/14/20 0327 08/15/20 0416  NA 140 140 139 142 139  K 3.6 3.9 4.2 4.2 4.1  CL 104 106 106 110 107  CO2 25 25 24 23 24   GLUCOSE 193* 127* 115* 113* 120*  BUN 16 19 20 17 13   CREATININE 0.92 0.94 0.84 0.94 0.80  CALCIUM 8.1* 8.2* 8.1* 8.4* 8.1*  MG 1.3* 1.7  --  1.3* 2.3    GFR: Estimated Creatinine Clearance: 73 mL/min (by C-G formula based on SCr of 0.8 mg/dL).  Liver Function Tests: No results for input(s): AST, ALT, ALKPHOS, BILITOT, PROT, ALBUMIN in the last 168 hours.  CBG: Recent Labs  Lab 08/16/20 1200 08/16/20 1722 08/16/20 2104 08/17/20 0822 08/17/20 1133  GLUCAP 117* 108* 143* 122* 111*     Recent Results (from the past 240 hour(s))  Aerobic/Anaerobic Culture (surgical/deep wound)     Status: None (Preliminary result)   Collection Time: 08/14/20  1:53 PM   Specimen: Abdomen; Abscess  Result Value Ref Range Status   Specimen Description   Final    ABDOMEN Performed at [facility], [address] [place], [place] [address]    Special Requests   Final    NONE Performed at [facility], [address] [place], [place]  [address]    Gram Stain   Final    ABUNDANT WBC PRESENT, PREDOMINANTLY PMN NO ORGANISMS SEEN    Culture   Final    NO GROWTH 3 DAYS NO ANAEROBES ISOLATED; CULTURE IN PROGRESS FOR 5 DAYS Performed at [facility], [address] [place], [place] [address]    Report Status PENDING  Incomplete  Radiology Studies: CT RENAL STONE STUDY  Result Date: 08/16/2020 CLINICAL DATA:  60 year old female with pyelonephritis and renal abscess. Follow-up CT. EXAM: CT ABDOMEN AND PELVIS WITHOUT CONTRAST TECHNIQUE: Multidetector CT imaging of the abdomen and pelvis was performed following the standard protocol without IV contrast. COMPARISON:  CT abdomen pelvis dated 08/13/2020. FINDINGS: Evaluation of this exam is limited in the absence of intravenous contrast. Lower chest: Partially visualized small bilateral pleural effusions and consolidative changes of the lung bases as seen previously. Several pulmonary nodules measure 7 mm in the right middle lobe. No intra-abdominal free air or free fluid. Hepatobiliary: No focal liver abnormality is seen. No gallstones, gallbladder wall thickening, or biliary dilatation. Pancreas: Unremarkable. No pancreatic ductal dilatation or surrounding inflammatory changes. Spleen: Normal in size without focal abnormality. Adrenals/Urinary Tract: The adrenal glands unremarkable. Left percutaneous nephrostomy with pigtail tip in the left renal pelvis in similar position. There has been interval placement a percutaneous drainage catheter with pigtail tip in the posterior perinephric collection. Slight interval decrease in the size of the perinephric collection compared to prior CT. There is no hydronephrosis or nephrolithiasis on either side. The right kidney is unremarkable. The urinary bladder is decompressed around a Foley catheter. Stomach/Bowel: There is no bowel obstruction or active inflammation. The appendix is not visualized with certainty. No inflammatory changes  identified in the right lower quadrant. Vascular/Lymphatic: Mild aortoiliac atherosclerotic disease. The IVC is unremarkable. No portal venous gas. There is no adenopathy. Reproductive: The uterus and ovaries are grossly unremarkable. Other: Mild diffuse subcutaneous edema. There has been interval placement of a percutaneous drainage catheter with tip in the fluid collection along the anterior aspect of the left psoas muscle. The collection has decreased in size now measuring approximately 4.2 x 8.0 cm (previously 4.4 x 8.7 cm). Musculoskeletal: No acute or significant osseous findings. IMPRESSION: 1. Interval placement of a percutaneous drainage catheter with tip in the posterior perinephric collection. Slight interval decrease in the size of the fluid collection compared to prior CT. 2. Decrease in the size of the collection anterior to the left psoas muscle status post percutaneous drainage catheter placement. Near 3. No hydronephrosis or nephrolithiasis. 4. No bowel obstruction. 5. Several pulmonary nodules measure 7 mm in the right middle lobe. 6. Aortic Atherosclerosis (ICD10-I70.0). Electronically Signed   By: Elgie Collard M.D.   On: 08/16/2020 20:27        Scheduled Meds: . Chlorhexidine Gluconate Cloth  6 each Topical Daily  . feeding supplement (GLUCERNA SHAKE)  237 mL Oral BID BM  . insulin aspart  0-20 Units Subcutaneous TID WC  . insulin aspart  0-5 Units Subcutaneous QHS  . insulin glargine  8 Units Subcutaneous BID  . sodium chloride flush  5 mL Intracatheter Q8H  . sodium chloride flush  5 mL Intracatheter Q8H   Continuous Infusions: . sodium chloride 250 mL (08/12/20 2301)  .  ceFAZolin (ANCEF) IV 2 g (08/17/20 1317)  . sodium chloride       LOS: 17 days   Time spent: , case discussed with  urology over the phone, discussed with IR at bedside Greater than 50% of this time was spent in counseling, explanation of diagnosis, planning of further management, and  coordination of care.  I have personally reviewed and interpreted on  08/17/2020 daily labs,  I reviewed all nursing notes, pharmacy notes, consultant notes,  vitals, pertinent old records  I have discussed plan of care as described above with RN , patient and husband  on 08/17/2020  Voice Recognition /Dragon dictation system was used to create this note, attempts have been made to correct errors. Please contact the author with questions and/or clarifications.   Albertine GratesFang Bruno Leach, MD PhD FACP [facility]  Available via Epic secure chat 7am-7pm for nonurgent issues Please page for urgent issues To page the attending provider between 7A-7P or the covering provider during after hours 7P-7A, please log into the web site [email or web address] and access using universal  password for that web site. If you do not have the password, please call the hospital operator.    08/17/2020, 3:15 PM

## 2020-08-18 DIAGNOSIS — R6520 Severe sepsis without septic shock: Secondary | ICD-10-CM | POA: Diagnosis not present

## 2020-08-18 LAB — COMPREHENSIVE METABOLIC PANEL
ALT: 9 U/L (ref 0–44)
AST: 17 U/L (ref 15–41)
Albumin: 2.4 g/dL — ABNORMAL LOW (ref 3.5–5.0)
Alkaline Phosphatase: 147 U/L — ABNORMAL HIGH (ref 38–126)
Anion gap: 9 (ref 5–15)
BUN: 15 mg/dL (ref 6–20)
CO2: 25 mmol/L (ref 22–32)
Calcium: 8.6 mg/dL — ABNORMAL LOW (ref 8.9–10.3)
Chloride: 103 mmol/L (ref 98–111)
Creatinine, Ser: 0.85 mg/dL (ref 0.44–1.00)
GFR, Estimated: >60 mL/min (ref >60)
Glucose, Bld: 139 mg/dL — ABNORMAL HIGH (ref 70–99)
Potassium: 4.1 mmol/L (ref 3.5–5.1)
Sodium: 137 mmol/L (ref 135–145)
Total Bilirubin: 0.2 mg/dL — ABNORMAL LOW (ref 0.3–1.2)
Total Protein: 6 g/dL — ABNORMAL LOW (ref 6.5–8.1)

## 2020-08-18 LAB — CBC WITH DIFFERENTIAL/PLATELET
Abs Immature Granulocytes: 0.53 10*3/uL — ABNORMAL HIGH (ref 0.00–0.07)
Basophils Absolute: 0.1 10*3/uL (ref 0.0–0.1)
Basophils Relative: 1 %
Eosinophils Absolute: 0.2 10*3/uL (ref 0.0–0.5)
Eosinophils Relative: 3 %
HCT: 27.6 % — ABNORMAL LOW (ref 36.0–46.0)
Hemoglobin: 8.8 g/dL — ABNORMAL LOW (ref 12.0–15.0)
Immature Granulocytes: 7 %
Lymphocytes Relative: 17 %
Lymphs Abs: 1.3 10*3/uL (ref 0.7–4.0)
MCH: 30.7 pg (ref 26.0–34.0)
MCHC: 31.9 g/dL (ref 30.0–36.0)
MCV: 96.2 fL (ref 80.0–100.0)
Monocytes Absolute: 0.8 10*3/uL (ref 0.1–1.0)
Monocytes Relative: 10 %
Neutro Abs: 4.9 10*3/uL (ref 1.7–7.7)
Neutrophils Relative %: 62 %
Platelets: 604 10*3/uL — ABNORMAL HIGH (ref 150–400)
RBC: 2.87 MIL/uL — ABNORMAL LOW (ref 3.87–5.11)
RDW: 15.2 % (ref 11.5–15.5)
WBC: 7.9 10*3/uL (ref 4.0–10.5)
nRBC: 0 % (ref 0.0–0.2)

## 2020-08-18 LAB — GLUCOSE, CAPILLARY
Glucose-Capillary: 140 mg/dL — ABNORMAL HIGH (ref 70–99)
Glucose-Capillary: 130 mg/dL — ABNORMAL HIGH (ref 70–99)
Glucose-Capillary: 194 mg/dL — ABNORMAL HIGH (ref 70–99)
Glucose-Capillary: 128 mg/dL — ABNORMAL HIGH (ref 70–99)

## 2020-08-18 LAB — MAGNESIUM: Magnesium: 1.4 mg/dL — ABNORMAL LOW (ref 1.7–2.4)

## 2020-08-18 MED ORDER — HEPARIN SOD (PORK) LOCK FLUSH 100 UNIT/ML IV SOLN: 250.0000 [IU] | INTRAVENOUS | Status: DC | PRN

## 2020-08-18 MED ORDER — INSULIN ASPART 100 UNIT/ML ~~LOC~~ SOLN
0.0000 [IU] | Freq: Three times a day (TID) | SUBCUTANEOUS | Status: DC
  Administered 2020-08-18 – 2020-08-19 (×2): 3 [IU] via SUBCUTANEOUS
  Administered 2020-08-19: 2 [IU] via SUBCUTANEOUS

## 2020-08-18 MED ORDER — INSULIN ASPART 100 UNIT/ML ~~LOC~~ SOLN: 0.0000 [IU] | Freq: Every day | SUBCUTANEOUS | Status: DC

## 2020-08-18 MED ORDER — LANTUS SOLOSTAR 100 UNIT/ML ~~LOC~~ SOPN: 8.0000 [IU] | PEN_INJECTOR | Freq: Two times a day (BID) | SUBCUTANEOUS | 0 refills | Status: AC

## 2020-08-18 MED ORDER — ALTEPLASE 2 MG IJ SOLR
2.0000 mg | Freq: Once | INTRAMUSCULAR | Status: AC
  Administered 2020-08-18: 2 mg
  Filled 2020-08-18: qty 2

## 2020-08-18 MED ORDER — BLOOD GLUCOSE MONITOR KIT: PACK | 0 refills | Status: AC

## 2020-08-18 MED ORDER — "PEN NEEDLES 3/16"" 31G X 5 MM MISC": 1.0000 | Freq: Three times a day (TID) | 1 refills | Status: AC

## 2020-08-18 MED ORDER — NOVOLOG FLEXPEN 100 UNIT/ML ~~LOC~~ SOPN
0.0000 [IU] | PEN_INJECTOR | Freq: Three times a day (TID) | SUBCUTANEOUS | 1 refills | Status: DC
Start: 2020-08-18 — End: 2020-09-05

## 2020-08-18 MED ORDER — MAGNESIUM SULFATE 2 GM/50ML IV SOLN
2.0000 g | Freq: Once | INTRAVENOUS | Status: AC
  Administered 2020-08-18: 2 g via INTRAVENOUS
  Filled 2020-08-18: qty 50

## 2020-08-18 NOTE — TOC Transition Note (Addendum)
Transition of Care [facility]) - CM/SW Discharge Note   Patient Details  Name: Tammie [name] MRN: [id number] Date of Birth: 26-Jun-1961  Transition of Care [facility]) CM/SW Contact:  Darleene Cleaver, LCSW Phone Number: 08/18/2020, 2:31 PM   Clinical Narrative:     Patient will be going home with [facility] for home IV antiobiotics.  Jeri Modena will be here later this afternoon to complete IV training for patient and her husband.  CSW updated bedside nurse, to make her aware that patient is discharging today.  CSW signing off please reconsult with any other social work needs, home health infusion services has been notified of planned discharge.  5:00pm  CSW spoke to [facility] with [facility], and she said they ran patient's insurance and it says it termed on December 31st.  [name] and patient stated that there have been some issues with people getting dropped from insurance.  Patient and her husband are going to contact HR and try to get insurance resolved.  Patient agreed to pay privately for the first week for home health and the IV antibiotic while the insurance is being resolved.  Per [name] if the insurance is resolved her nursing team will be able to provide the [facility] RN.  If insurance is not resolved and patient does not have insurance she will contact Kathyrn Sheriff, TOC lead to see if a LOG can be provided for patient to receive [facility] services if they are not able to afford it anymore.  CSW signing off, [name] will follow up with TOC lead as needed.   Final next level of care: Home w Home Health Services Barriers to Discharge: Barriers Resolved   Patient Goals and CMS Choice Patient states their goals for this hospitalization and ongoing recovery are:: To return back home with IV antibiotics CMS Medicare.gov Compare Post Acute Care list provided to:: Patient Choice offered to / list presented to : Patient  Discharge Placement  Home with IV antibiotics.                      Discharge Plan and Services   Discharge Planning Services: CM Consult            DME Arranged: IV pump/equipment DME Agency: Other - Comment ([facility]) Date DME Agency Contacted: 08/18/20 Time DME Agency Contacted: 1430 Representative spoke with at DME Agency: Jeri Modena HH Arranged: RN HH Agency: Other - See comment ([facility]) Date HH Agency Contacted: 08/18/20 Time HH Agency Contacted: 1431 Representative spoke with at [facility] Agency: [name] spoke to her  Social Determinants of Health (SDOH) Interventions     Readmission Risk Interventions No flowsheet data found.

## 2020-08-18 NOTE — Discharge Summary (Signed)
Physician Discharge Summary  Via Rosado KNL:[id number] DOB: 07-31-1961 DOA: 07/31/2020  PCP: Patient, No Pcp Per  Admit date: 07/31/2020 Discharge date: 08/18/2020  Admitted From: Home Disposition:  Home  Discharge Condition:Stable CODE STATUS:FULL Diet recommendation: Carb Modified  Brief/Interim Summary:  Tammie [name] a 60 y.o.femalewith medical history significant for hypertension, obesity, type II DM, who was admitted 12/24 with chief complaint of abdominal pain. She was found to have acute pyelonephritis with left renal abscess, severe sepsis, acute kidney injury, hyper osmolar nonketotic hyperglycemia (initial blood sugar in the than 700s), severe hyponatremia. Inthe ED:CT imaging showed multiple pulmonary nodules, imaging of abdomen pelvis With left hydronephrosis and evidence of perinephritic abscess. She was transferred to vascular hospital for placement of left nephrostomy tube and left pelvic abscess drain. She was initially admitted to the ICU where she was given 3% hypertonic saline infusion for severe hyponatremia. She was also treated with empiric IV antibiotics for severe sepsis secondary to left pyelonephritis, left renal abscess and pelvic abscess.  She underwent drain placement by IR.  She has been followed by urology, IR during this hospitalization.  Blood cultures also showed E. coli.  ID was consulted and she underwent PICC line placement for IV antibiotics at home.  She is medically stable for discharge home today.  She will follow-up with IR, urology as an outpatient  Following problems were addressed during her hospitalization:   Severe sepsis with E. coli bacteremia/left emphysematouspyelonephritis with perinephric abscess and retroperitoneal abscess -status post left nephrostomy andleftretroperitoneal/pelvic abscess drain placementsx212/24/21,  -Was on Rocephin nowAntibiotics have been deescalated to IV Ancef,  -right upper extremity PICC line placed on  January 1, ID following -repeat ct scan 1/05 showed increased size of perirenal abscess, -s/p third drain placement on 1/6 -WBC normalized, no fever, currently denies pain  -She will follow-up with urology as an outpatient  -Set up home health for IV infusion, home health order placed   AKI without known history of CKD/anemia of chronic disease - BUN 112/creatinine 4.19 on presentation - Likely multifactorial secondary to emphysematous pyelonephritis with abscess, ischemic ATN in the setting of volume depletion. - Status post percutaneous nephrostomy and perirenal abscess drainage. -d/c foley -AKI resolved  Numerous pulmonary nodules Suspect septic emboli, but cannot rule out malignancy - respiratory status stable, denies chest pain, denies short of breath, not on oxygen - nonocclusive thrombus forming in the left renal vein likely represents source of emboli. -Per pulmonary patient will need repeat CT chest done in approximately 6 weeks.  -Outpatient follow-up with pulmonology set up.  -Bibasilar crackles/moderatebilateral pleural effusion She had received IV Lasix on 08/09/2020 and 08/10/2020.Marland Kitchen  Newly diagnosed diabetes mellitus type 2/HHNK, present on admission -Hemoglobin A1c noted at 12.2.  -Diabetic coordinator was consulted.  She will be going home with insulin, diabetic education provided  Severe hyponatremia, resolved Sodium less than 102 on presentation Etiology is unclear. Initially required treatment with hypertonic saline in the ICU. Sodium has since normalized  Hypokalemia/hypomagnesemia Replaced and normalized  Questionable ileus, resolved -Abdominal films obtained concerning for possible ileus. CT abdomen and pelvis obtained negative for ileus. -Tolerating regular diet -Encouraged ambulation, having BM    Discharge Diagnoses:  Principal Problem:   Severe sepsis ([place]) Active Problems:   Multifocal pneumonia   Hyperosmolar  hyperglycemic state (HHS) ([place])   Hyponatremia   Hypertension   Acute renal failure (HCC)   Hypokalemia   Severe protein-calorie malnutrition (HCC)   Class 1 obesity   Leukocytosis   Hypomagnesemia  Hyperphosphatemia   Abdominal distention   Lung nodules   Delirium   Perirenal abscess   E coli bacteremia   Acute pyelonephritis   Bibasilar crackles   Diabetic ketoacidosis without coma associated with type 2 diabetes mellitus ([place])   Ileus (HCC)   Emphysematous pyelonephritis of left kidney   Pleural effusion    Discharge Instructions  Discharge Instructions    [facility] pharmacist to adjust dose for Vancomycin, Aminoglycosides and other anti-infective therapies as requested by physician.   Complete by: As directed    [facility] to provide Cath Flo 2mg    Complete by: As directed    Administer for PICC line occlusion and as ordered by physician for other access device issues.   Anaphylaxis Kit: Provided to treat any anaphylactic reaction to the medication being provided to the patient if First Dose or when requested by physician   Complete by: As directed    Epinephrine 1mg /ml vial / amp: Administer 0.3mg  (0.58ml) subcutaneously once for moderate to severe anaphylaxis, nurse to call physician and pharmacy when reaction occurs and call 911 if needed for immediate care   Diphenhydramine 50mg /ml IV vial: Administer 25-50mg  IV/IM PRN for first dose reaction, rash, itching, mild reaction, nurse to call physician and pharmacy when reaction occurs   Sodium Chloride 0.9% NS 558ml IV: Administer if needed for hypovolemic blood pressure drop or as ordered by physician after call to physician with anaphylactic reaction   Change dressing on IV access line weekly and PRN   Complete by: As directed    Diet - low sodium heart healthy   Complete by: As directed    Diet Carb Modified   Complete by: As directed    Discharge instructions   Complete by: As directed     1)please take prescribed medications as instructed 2)Follow up with your PCP in a week.  Do a CBC, BMP test during the follow-up 3)Follow up with urology, radiology, pulmonology as an outpatient.  Name and number of the providers have been attached 4)You have an appointment with infectious disease on 09/04/20 5)Follow up with home health   Discharge wound care:   Complete by: As directed    Pressure offloading measures   Flush IV access with Sodium Chloride 0.9% and Heparin 10 units/ml or 100 units/ml   Complete by: As directed    Home infusion instructions - [facility]   Complete by: As directed    Instructions: Flush IV access with Sodium Chloride 0.9% and Heparin 10units/ml or 100units/ml   Change dressing on IV access line: Weekly and PRN   Instructions Cath Flo 2mg : Administer for PICC Line occlusion and as ordered by physician for other access device   [facility] pharmacist to adjust dose for: Vancomycin, Aminoglycosides and other anti-infective therapies as requested by physician   Increase activity slowly   Complete by: As directed    Method of administration may be changed at the discretion of home infusion pharmacist based upon assessment of the patient and/or caregiver's ability to self-administer the medication ordered   Complete by: As directed    No wound care   Complete by: As directed      Allergies as of 08/18/2020      Reactions   Codeine Rash      Medication List    TAKE these medications   acetaminophen 500 MG tablet Commonly known as: TYLENOL Take 1,000 mg by mouth every 6 (six) hours as needed.  blood glucose meter kit and supplies Kit Dispense based on patient and insurance preference. Use up to four times daily as directed. (FOR ICD-9 250.00, 250.01).   ceFAZolin  IVPB Commonly known as: ANCEF Inject 2 g into the vein every 8 (eight) hours. Indication:  Emphysematous pyelonephritis, intra-abdominal abscess First Dose: Yes Last  Day of Therapy:  09/25/2020 Labs - Once weekly:  CBC/D and BMP, Labs - Every other week:  ESR and CRP Method of administration: IV Push Method of administration may be changed at the discretion of home infusion pharmacist based upon assessment of the patient and/or caregiver's ability to self-administer the medication ordered.   Lantus SoloStar 100 UNIT/ML Solostar Pen Generic drug: insulin glargine Inject 8 Units into the skin 2 (two) times daily.   NovoLOG FlexPen 100 UNIT/ML FlexPen Generic drug: insulin aspart Inject 0-15 Units into the skin 3 (three) times daily with meals.   NYQUIL PO Take 1 tablet by mouth daily as needed (congestion).   Pen Needles 3/16" 31G X 5 MM Misc 1 each by Does not apply route 3 (three) times daily.            Discharge Care Instructions  (From admission, onward)         Start     Ordered   08/16/20 0000  Change dressing on IV access line weekly and PRN  (Home infusion instructions - [facility] )        08/16/20 0929   08/16/20 0000  Discharge wound care:       Comments: Pressure offloading measures   08/16/20 0929          Follow-up Information    Sherrilyn Rist A, MD. Schedule an appointment as soon as possible for a visit in 3 week(s).   Specialty: Pulmonary Disease Contact information: [address] [place] [address] [phone]        Robley Fries, MD Follow up in 1 week(s).   Specialty: Urology Contact information: [address] [place] [address] [phone]        [facility] Follow up.   Specialty: Radiology Contact information: [address] [id number] [place] [phone]       [facility] Follow up.   Why: for diabetes management Contact information: [address] [place] [address] [phone]              Allergies  Allergen Reactions  . Codeine Rash    Consultations:  Urology,IR,PCCM,ID   Procedures/Studies: CT ABDOMEN PELVIS W WO CONTRAST  Result Date: 08/13/2020 CLINICAL DATA:  Complicated pyelonephritis.  Abscess. EXAM: CT ABDOMEN AND PELVIS WITHOUT AND WITH CONTRAST TECHNIQUE: Multidetector CT imaging of the abdomen and pelvis was performed following the standard protocol before and following the bolus administration of intravenous contrast. CONTRAST:  157mL OMNIPAQUE IOHEXOL 300 MG/ML  SOLN COMPARISON:  08/08/2020 FINDINGS: Lower Chest: Small bilateral pleural effusions are again seen with atelectasis or consolidation in the posterior lower lobes. Multiple small pulmonary nodules are again seen in the visualized portion of the right lower lung, without significant change. Hepatobiliary: No hepatic masses identified. Gallbladder is unremarkable. No evidence of biliary ductal dilatation. Pancreas:  No mass or inflammatory changes. Spleen: Within normal limits in size and appearance. Adrenals/Urinary Tract: A left percutaneous nephrostomy tube is seen in appropriate position and there is no evidence of hydronephrosis. Diffuse left renal swelling is again seen with multiple  small irregular fluid density collections throughout the renal parenchyma, and a perinephric fluid and gas collection is again seen along the lateral and posterior margins of the kidney. This measures 9.4 x 4.3 cm on image 29/7, increased from 7.4 x 2.5 cm when remeasured in same planes on prior exam. A 2nd percutaneous drainage catheter is seen in the left lower quadrant along the anterior aspect of the left psoas muscle. Left retroperitoneal fluid collection in this region measures 8.7 x 4.4 cm on image 54/2, without significant change. A 3rd fluid and gas collection is seen in the left pararenal space which measures 6.7 x 2.7 cm on image 46/7, without significant change in size since prior exam. Foley catheter is noted within  the urinary bladder. Stomach/Bowel: No evidence of obstruction, inflammatory process or abnormal fluid collections. Vascular/Lymphatic: No pathologically enlarged lymph nodes. No abdominal aortic aneurysm. Reproductive:  No mass or other significant abnormality. Other:  None. Musculoskeletal:  No suspicious bone lesions identified. IMPRESSION: Left percutaneous nephrostomy tube in appropriate position. No evidence of hydronephrosis. Stable diffuse left renal parenchymal swelling with multiple small internal fluid density collections. Mild increase in size of left perinephric fluid and gas collection. No significant change in size of fluid and gas collections along the anterior aspect of left psoas muscle, and in the left lateral pararenal space. Stable small bilateral pleural effusions and posterior lower lobe atelectasis or consolidation. Stable small right lower lung pulmonary nodules. Electronically Signed   By: Marlaine Hind M.D.   On: 08/13/2020 08:27   CT ABDOMEN PELVIS WO CONTRAST  Result Date: 08/01/2020 CLINICAL DATA:  Abnormal chest radiograph, lung nodule, generalized abdominal pain and distension, confusion, no medical care in years EXAM: CT CHEST, ABDOMEN AND PELVIS WITHOUT CONTRAST TECHNIQUE: Multidetector CT imaging of the chest, abdomen and pelvis was performed following the standard protocol without IV contrast. IV contrast not administered due to renal dysfunction. Sagittal and coronal MPR images reconstructed from axial data set. No oral contrast administered. 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 Minimal pericardial fluid. Heart otherwise unremarkable. Mediastinum/Nodes: Questionable thickening of esophagus versus artifact from underdistention. No thoracic adenopathy identified. Base of cervical region normal appearance. Lungs/Pleura: Multiple BILATERAL pulmonary  nodules up to 12 mm diameter. A more confluent area of opacity is seen in the posterior segment of the RIGHT upper lobe, 4.0 x 3.0 cm. Some of the nodules appear ill-defined and slightly shaggy a. These could represent metastatic foci or septic emboli. No definite cavitary lesions are seen. Dependent atelectasis LEFT lower lobe. No pulmonary infiltrate, pleural effusion, or pneumothorax. Musculoskeletal: Scattered endplate spur formation thoracic spine. No sclerotic or destructive osseous lesions. CT ABDOMEN PELVIS FINDINGS Hepatobiliary: Gallbladder and liver normal appearance Pancreas: Atrophic, otherwise unremarkable Spleen: Normal appearance Adrenals/Urinary Tract: Adrenal glands normal appearance. Minimal collecting system dilatation RIGHT kidney without mass or calcification. RIGHT ureter decompressed. Abnormal appearance of LEFT kidney, which is diffusely enlarged and demonstrates significant perinephric edema. In addition, significant gas is seen in the perinephric space and questionably subcapsular, minimally in the collecting system, consistent with emphysematous pyelonephritis. Edema/fluid is seen extending along the lateral conal fascia and the anterior and posterior pararenal fascia. A gas and fluid collection is identified inferior to the LEFT kidney, 6.4 x 3.6 x 5.9 cm consistent with abscess. 4 mm calculus at LEFT ureteropelvic junction with mild LEFT hydronephrosis. Small amount of air within bladder though this could be related to  prior catheterization. Stomach/Bowel: Stomach and bowel loops normal appearance. Edema from the LEFT renal process extends to the lateral conal fascia and adjacent to the descending colon but the descending colon does not appear to be thickened or involved. Vascular/Lymphatic: Minimal atherosclerotic calcification aorta. Aorta normal caliber. No definite adenopathy. Reproductive: Unremarkable uterus and adnexa Other: No free air or free fluid. LEFT retroperitoneal edema  the posterior pararenal space extending into upper pelvis. No hernia. Musculoskeletal: No acute osseous findings. IMPRESSION: Multiple BILATERAL pulmonary nodules up to 12 mm diameter, some of which appear ill-defined and slightly shaggy. These could represent metastatic foci or septic emboli. Correlation with cardiology recommended to exclude source of septic embolism. 4 mm LEFT UPJ calculus with mild LEFT hydronephrosis and hydroureter. 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 Recommend annual imaging followup by CTA or MRA. This recommendation follows 2010 ACCF/AHA/AATS/ACR/ASA/SCA/SCAI/SIR/STS/SVM Guidelines for the Diagnosis and Management of Patients with Thoracic Aortic Disease. Circulation. 2010; 121: K270-W237. Aortic aneurysm NOS (ICD10-I71.9) Aortic Atherosclerosis (ICD10-I70.0) Findings discussed with Dr. Constance Haw at time of exam on 08/01/2020. Electronically Signed   By: Lavonia Dana M.D.   On: 08/01/2020 11:34   DG Chest 2 View  Result Date: 08/07/2020 CLINICAL DATA:  Sepsis.  Left pyelonephritis. EXAM: CHEST - 2 VIEW COMPARISON:  08/04/2020 FINDINGS: Stable cardiomegaly. Small left pleural effusion and left basilar atelectasis or infiltrate show no significant change. Probable small layering right pleural effusion also noted. Increased airspace opacity is seen in the right upper lobe since prior study. IMPRESSION: Increased right upper lobe airspace opacity. Stable small left pleural effusion, and left basilar atelectasis versus infiltrate. Probable small layering right pleural effusion. Electronically Signed   By: Marlaine Hind M.D.   On: 08/07/2020 12:54   CT HEAD WO CONTRAST  Result Date: 08/01/2020 CLINICAL DATA:  Disorientation, abnormal chest radiograph with pulmonary nodules, hyponatremia EXAM: CT HEAD  WITHOUT CONTRAST TECHNIQUE: Contiguous axial images were obtained from the base of the skull through the vertex without intravenous contrast. Sagittal and coronal MPR images reconstructed from axial data set. COMPARISON:  None FINDINGS: Brain: Motion artifacts on initial imaging, for which repeat imaging was performed. Normal ventricular morphology. No midline shift or mass effect. Normal appearance of brain parenchyma. No intracranial hemorrhage, mass lesion, or evidence of acute infarction. Vascular: No hyperdense vessels Skull: Intact Sinuses/Orbits: Clear Other: N/A IMPRESSION: No acute intracranial abnormalities. Electronically Signed   By: Lavonia Dana M.D.   On: 08/01/2020 11:36   CT CHEST WO CONTRAST  Result Date: 08/01/2020 CLINICAL DATA:  Abnormal chest radiograph, lung nodule, generalized abdominal pain and distension, confusion, no medical care in years EXAM: CT CHEST, ABDOMEN AND PELVIS WITHOUT CONTRAST TECHNIQUE: Multidetector CT imaging of the chest, abdomen and pelvis was performed following the standard protocol without IV contrast. IV contrast not administered due to renal dysfunction. Sagittal and coronal MPR images reconstructed from axial data set. No oral contrast administered. 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 Minimal pericardial fluid. Heart otherwise unremarkable. Mediastinum/Nodes: Questionable thickening of esophagus versus artifact from underdistention. No thoracic adenopathy identified. Base of cervical region normal appearance. Lungs/Pleura: Multiple BILATERAL pulmonary nodules up to 12 mm diameter. A more confluent area of opacity is seen in the posterior segment of the RIGHT upper lobe, 4.0 x  3.0 cm. Some of the nodules appear ill-defined and slightly shaggy a. These could represent metastatic foci or septic emboli. No definite cavitary lesions are seen.  Dependent atelectasis LEFT lower lobe. No pulmonary infiltrate, pleural effusion, or pneumothorax. Musculoskeletal: Scattered endplate spur formation thoracic spine. No sclerotic or destructive osseous lesions. CT ABDOMEN PELVIS FINDINGS Hepatobiliary: Gallbladder and liver normal appearance Pancreas: Atrophic, otherwise unremarkable Spleen: Normal appearance Adrenals/Urinary Tract: Adrenal glands normal appearance. Minimal collecting system dilatation RIGHT kidney without mass or calcification. RIGHT ureter decompressed. Abnormal appearance of LEFT kidney, which is diffusely enlarged and demonstrates significant perinephric edema. In addition, significant gas is seen in the perinephric space and questionably subcapsular, minimally in the collecting system, consistent with emphysematous pyelonephritis. Edema/fluid is seen extending along the lateral conal fascia and the anterior and posterior pararenal fascia. A gas and fluid collection is identified inferior to the LEFT kidney, 6.4 x 3.6 x 5.9 cm consistent with abscess. 4 mm calculus at LEFT ureteropelvic junction with mild LEFT hydronephrosis. Small amount of air within bladder though this could be related to prior catheterization. Stomach/Bowel: Stomach and bowel loops normal appearance. Edema from the LEFT renal process extends to the lateral conal fascia and adjacent to the descending colon but the descending colon does not appear to be thickened or involved. Vascular/Lymphatic: Minimal atherosclerotic calcification aorta. Aorta normal caliber. No definite adenopathy. Reproductive: Unremarkable uterus and adnexa Other: No free air or free fluid. LEFT retroperitoneal edema the posterior pararenal space extending into upper pelvis. No hernia. Musculoskeletal: No acute osseous findings. IMPRESSION: Multiple BILATERAL pulmonary nodules up to 12 mm diameter, some of which appear ill-defined and slightly shaggy. These could represent metastatic foci or septic  emboli. Correlation with cardiology recommended to exclude source of septic embolism. 4 mm LEFT UPJ calculus with mild LEFT hydronephrosis and hydroureter. 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 Recommend annual imaging followup by CTA or MRA. This recommendation follows 2010 ACCF/AHA/AATS/ACR/ASA/SCA/SCAI/SIR/STS/SVM Guidelines for the Diagnosis and Management of Patients with Thoracic Aortic Disease. Circulation. 2010; 121: Z660-Y301. Aortic aneurysm NOS (ICD10-I71.9) Aortic Atherosclerosis (ICD10-I70.0) Findings discussed with Dr. Constance Haw at time of exam on 08/01/2020. Electronically Signed   By: Lavonia Dana M.D.   On: 08/01/2020 11:34   CT CHEST ABDOMEN PELVIS W CONTRAST  Result Date: 08/08/2020 CLINICAL DATA:  60 year old female with history of complicated pyelonephritis. EXAM: CT CHEST, ABDOMEN, AND PELVIS WITH CONTRAST TECHNIQUE: Multidetector CT imaging of the chest, abdomen and pelvis was performed following the standard protocol during bolus administration of intravenous contrast. CONTRAST:  129mL OMNIPAQUE IOHEXOL 300 MG/ML  SOLN COMPARISON:  CT the chest, abdomen and pelvis 08/01/2020. FINDINGS: CT CHEST FINDINGS Cardiovascular: Heart size is normal. There is no significant pericardial fluid, thickening or pericardial calcification. There is aortic atherosclerosis, as well as atherosclerosis of the great vessels of the mediastinum and the coronary arteries, including calcified atherosclerotic plaque in the left anterior descending coronary artery. Dilatation of the pulmonic trunk (3.6 cm in diameter). Mediastinum/Nodes: No pathologically enlarged in mediastinal or hilar lymph nodes. Esophagus is unremarkable in appearance. No axillary lymphadenopathy. Lungs/Pleura: Numerous pulmonary nodules and masses are again  noted throughout the lungs bilaterally, many of which have central areas of cavitation. The largest of these is in the posterior aspect of the right upper lobe (axial image 54 of series 5) measuring 3.2 x 3.2 cm, with  central low attenuation regions and internal cavitation, compatible with a large intrapulmonary abscess. Overall, findings are strongly suggestive of widespread septic emboli. Moderate bilateral pleural effusions lying dependently with extensive passive atelectasis in the lower lobes of the lungs bilaterally. Musculoskeletal: There are no aggressive appearing lytic or blastic lesions noted in the visualized portions of the skeleton. CT ABDOMEN PELVIS FINDINGS Hepatobiliary: No suspicious cystic or solid hepatic lesions. No intra or extrahepatic biliary ductal dilatation. Gallbladder is normal in appearance. Pancreas: No pancreatic mass. No pancreatic ductal dilatation. No peripancreatic fluid collections or inflammatory changes. Spleen: Unremarkable. Adrenals/Urinary Tract: New nephrostomy tube extends into the left renal pelvis and appears appropriately located. The left kidneys enlarged and very heterogeneous in appearance with multiple hypovascular hypoenhancing regions, compatible with residual areas of pyelonephritis and associated renal and perinephric abscesses. These extend into the perinephric space where there is a combination of gas and fluid. Some of this tracks caudally where there is a larger collection of predominantly fluid, trace amount of gas, and some higher attenuation material adjacent to the tip of an indwelling drain (best appreciated on axial image 89 of series 2 and coronal image 74 of series 6) which measures approximately 4.3 x 2.1 x 3.6 cm. This perinephric fluid collection trucks caudally into the left hemipelvis along the left pelvic sidewall, coming into contact with the left iliacus musculature. Overall, the extent of fluid in this collection appears increased. This is  well demonstrated in a relatively well-defined portion of the collection laterally which currently measures 9.5 x 3.6 cm (axial image 78 of series 2) and previously measured only 8.2 x 2.4 cm when measured in a similar fashion on 08/01/2020. Right kidney and bilateral adrenal glands are normal in appearance. No right hydroureteronephrosis. Urinary bladder is completely decompressed around an indwelling Foley balloon catheter. Small amount of gas present in the lumen of the urinary bladder is presumably iatrogenic. Stomach/Bowel: The appearance of the stomach is normal. There is no pathologic dilatation of small bowel or colon. Normal appendix. Vascular/Lymphatic: Aortic atherosclerosis, without evidence of aneurysm or dissection in the abdominal or pelvic vasculature. Filling defect in the left renal vein best appreciated on coronal image 77 of series 6, compatible with nonocclusive left renal vein thrombus. No lymphadenopathy noted in the abdomen or pelvis. Reproductive: Uterus and ovaries are unremarkable in appearance. Other: Diffuse body wall edema. Trace volume of ascites. No pneumoperitoneum. Musculoskeletal: There are no aggressive appearing lytic or blastic lesions noted in the visualized portions of the skeleton. IMPRESSION: 1. Compared to the prior study there is been interval nephrostomy tube placement which appears appropriately located, as well as percutaneous drainage catheter placement in the inferior aspect of the patient's extensive perinephric abscess. Unfortunately, the overall size of the abscess appears increased compared to the prior examination, and there is new high attenuation fluid within the inferior aspect of this collection, which could represent some recent hemorrhage. If there is concern for active hemorrhage, further evaluation with triple-phase CT of the abdomen and pelvis should be considered to evaluate for source of active bleeding. 2. Numerous large pulmonary nodules and masses  in the lungs bilaterally, with imaging characteristics most compatible with extensive septic emboli with multifocal pulmonary abscess formation. 3. 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 This likely represents the source of emboli in this patient with evidence of septic embolization. 4. Moderate bilateral pleural effusions lying dependently with associated areas of passive atelectasis in the lower lobes of the  lungs bilaterally. 5. Dilatation of the pulmonic trunk (3.6 cm in diameter), concerning for pulmonary arterial hypertension. 6. Additional incidental findings, as above. These results were called by telephone at the time of interpretation on 08/08/2020 at 5:38 pm to provider [facility], who verbally acknowledged these results. Electronically Signed   By: Vinnie Langton M.D.   On: 08/08/2020 17:38   DG Chest Port 1 View  Result Date: 08/04/2020 CLINICAL DATA:  60 year old female with respiratory failure, sepsis, emphysematous left renal pyelonephritis status post CT-guided nephrostomy, abscess drain. EXAM: PORTABLE CHEST 1 VIEW COMPARISON:  CT Chest, Abdomen, and Pelvis 08/01/2020 and earlier. FINDINGS: Portable AP semi upright view at 0541 hours. Lower lung volumes. Stable cardiac size and mediastinal contours. Increased left lung base opacity. Additional patchy and nodular bilateral pulmonary opacity better demonstrated by CT and not significantly changed. No pneumothorax. No pulmonary edema. No right pleural effusion. Visible bowel-gas within normal limits. No acute osseous abnormality identified. IMPRESSION: 1. Lower lung volumes with increased left lung base opacity which could be related to combination of pleural effusion, atelectasis, or pneumonia. 2. Scattered patchy and nodular lung opacity elsewhere stable and suspicious for hematogenous disseminated infection (see CT 08/01/2020). Electronically Signed    By: Genevie Ann M.D.   On: 08/04/2020 05:58   DG Chest Portable 1 View  Result Date: 07/31/2020 CLINICAL DATA:  Delirium EXAM: PORTABLE CHEST 1 VIEW COMPARISON:  None. FINDINGS: The heart size and mediastinal contours are within normal limits. Elevation of left hemidiaphragm Bilateral patchy airspace opacities. No pulmonary edema. No definite pleural effusion. No pneumothorax. No acute osseous abnormality. IMPRESSION: Multifocal airspace opacities that could represent infection or inflammation. COVID-19 infection not excluded. Followup PA and lateral chest X-ray is recommended in 3-4 weeks following therapy to ensure resolution and exclude underlying malignancy. Electronically Signed   By: Iven Finn M.D.   On: 07/31/2020 17:46   DG Abd 2 Views  Result Date: 08/08/2020 CLINICAL DATA:  Abdominal pain and decreased oral intake. EXAM: ABDOMEN - 2 VIEW COMPARISON:  Plain film of the abdomen dated 08/07/2020. FINDINGS: Mildly distended small bowel within the central abdomen with nonspecific air-fluid levels in the upper pelvis. Air is again seen within the colon. No evidence of renal or ureteral calculi. Two percutaneous pigtail catheters are again seen within the LEFT abdomen. Dense consolidation the LEFT lung base, presumed pneumonia. IMPRESSION: 1. Mildly distended small bowel within the central abdomen with nonspecific air-fluid levels in the upper pelvis. Findings could represent ileus or partial small bowel obstruction. 2. LEFT lower lung consolidation, pneumonia versus atelectasis. Electronically Signed   By: Franki Cabot M.D.   On: 08/08/2020 10:04   DG Abd 2 Views  Result Date: 08/07/2020 CLINICAL DATA:  Sepsis.  Left pyelonephritis. EXAM: ABDOMEN - 2 VIEW COMPARISON:  None. FINDINGS: Mild gaseous distention of colon is seen predominately involving the nondependent sigmoid colon. Scattered air-fluid levels are seen, however there is no evidence of dilated small bowel loops. These findings are  likely due to mild ileus. No radiopaque urinary calculi identified. Two percutaneous pigtail catheters are seen in the left upper and lower quadrants. IMPRESSION: Probable mild adynamic ileus. Two percutaneous pigtail catheters in left upper and lower quadrants. Electronically Signed   By: Marlaine Hind M.D.   On: 08/07/2020 12:51   CT RENAL STONE STUDY  Result Date: 08/16/2020 CLINICAL DATA:  60 year old female with pyelonephritis and renal abscess. Follow-up CT. EXAM: CT ABDOMEN AND PELVIS WITHOUT CONTRAST TECHNIQUE: Multidetector CT imaging of the  abdomen and pelvis was performed following the standard protocol without IV contrast. COMPARISON:  CT abdomen pelvis dated 08/13/2020. FINDINGS: Evaluation of this exam is limited in the absence of intravenous contrast. Lower chest: Partially visualized small bilateral pleural effusions and consolidative changes of the lung bases as seen previously. Several pulmonary nodules measure 7 mm in the right middle lobe. No intra-abdominal free air or free fluid. Hepatobiliary: No focal liver abnormality is seen. No gallstones, gallbladder wall thickening, or biliary dilatation. Pancreas: Unremarkable. No pancreatic ductal dilatation or surrounding inflammatory changes. Spleen: Normal in size without focal abnormality. Adrenals/Urinary Tract: The adrenal glands unremarkable. Left percutaneous nephrostomy with pigtail tip in the left renal pelvis in similar position. There has been interval placement a percutaneous drainage catheter with pigtail tip in the posterior perinephric collection. Slight interval decrease in the size of the perinephric collection compared to prior CT. There is no hydronephrosis or nephrolithiasis on either side. The right kidney is unremarkable. The urinary bladder is decompressed around a Foley catheter. Stomach/Bowel: There is no bowel obstruction or active inflammation. The appendix is not visualized with certainty. No inflammatory changes  identified in the right lower quadrant. Vascular/Lymphatic: Mild aortoiliac atherosclerotic disease. The IVC is unremarkable. No portal venous gas. There is no adenopathy. Reproductive: The uterus and ovaries are grossly unremarkable. Other: Mild diffuse subcutaneous edema. There has been interval placement of a percutaneous drainage catheter with tip in the fluid collection along the anterior aspect of the left psoas muscle. The collection has decreased in size now measuring approximately 4.2 x 8.0 cm (previously 4.4 x 8.7 cm). Musculoskeletal: No acute or significant osseous findings. IMPRESSION: 1. Interval placement of a percutaneous drainage catheter with tip in the posterior perinephric collection. Slight interval decrease in the size of the fluid collection compared to prior CT. 2. Decrease in the size of the collection anterior to the left psoas muscle status post percutaneous drainage catheter placement. Near 3. No hydronephrosis or nephrolithiasis. 4. No bowel obstruction. 5. Several pulmonary nodules measure 7 mm in the right middle lobe. 6. Aortic Atherosclerosis (ICD10-I70.0). Electronically Signed   By: Anner Crete M.D.   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 EXAM: CT IMAGE GUIDED DRAINAGE BY PERCUTANEOUS CATHETER COMPARISON:  None. MEDICATIONS: The patient is currently admitted to the hospital and receiving intravenous antibiotics. The antibiotics were administered within an appropriate time frame prior to the initiation of the procedure. ANESTHESIA/SEDATION: Moderate (conscious) sedation was employed during this procedure. A total of Versed 2 mg and Fentanyl 100 mcg was administered intravenously. Moderate Sedation Time: 20  minutes. The patient's level of consciousness and vital signs were monitored continuously by radiology nursing throughout the procedure under my direct supervision. CONTRAST:  None COMPLICATIONS: None immediate. PROCEDURE: Informed written consent was obtained from the patient after a discussion of the risks, benefits and alternatives to treatment. The patient was placed in a partial right lateral decubitus position on the CT gantry and a pre procedural CT was performed re-demonstrating the known abscess/fluid collection within the left posterior perinephric space. The procedure was planned. A timeout was performed prior to the initiation of the procedure. The left flank was prepped and draped in the usual sterile fashion. The overlying soft tissues were anesthetized with 1% lidocaine with epinephrine. Appropriate trajectory was planned with the use  of a 22 gauge spinal needle. An 18 gauge trocar needle was advanced into the abscess/fluid collection and a short Amplatz super stiff wire was coiled within the collection. Appropriate positioning was confirmed with a limited CT scan. The tract was serially dilated allowing placement of a 12 [place] all-purpose drainage catheter. Appropriate positioning was confirmed with a limited postprocedural CT scan. Approximately 100 ml of purulent fluid was aspirated. The tube was connected to a bulb suction and sutured in place. A dressing was placed. The patient tolerated the procedure well without immediate post procedural complication. IMPRESSION: Successful CT guided placement of a 26 French all purpose drain catheter into the left posterior perinephric abscess with aspiration of 100 mL of purulent fluid. Samples were sent to the laboratory as requested by the ordering clinical team. Ruthann Cancer, MD [facility] Radiology Electronically Signed   By: Ruthann Cancer MD   On: 08/14/2020 15:01   CT IMAGE GUIDED DRAINAGE BY  PERCUTANEOUS CATHETER  Result Date: 08/02/2020 INDICATION: Left emphysematous pyelonephritis with obstructing calculus and retroperitoneal abscess EXAM: CT-GUIDED LEFT NEPHROSTOMY PLACEMENT CT-GUIDED PELVIC ABSCESS DRAIN CATHETER PLACEMENT MEDICATIONS: The patient is currently admitted to the hospital and receiving intravenous antibiotics. The antibiotics were administered within an appropriate time frame prior to the initiation of the procedure. ANESTHESIA/SEDATION: Intravenous Fentanyl 36mcg and Versed $RemoveBe'1mg'wWLXdFivh$  were administered as conscious sedation during continuous monitoring of the patient's level of consciousness and physiological / cardiorespiratory status by the radiology RN, with a total moderate sedation time of 42 minutes. COMPLICATIONS: None immediate. PROCEDURE: Informed written consent was obtained from the patient after a thorough discussion of the procedural risks, benefits and alternatives. All questions were addressed. Maximal Sterile Barrier Technique was utilized including caps, mask, sterile gowns, sterile gloves, sterile drape, hand hygiene and skin antiseptic. A timeout was performed prior to the initiation of the procedure. Patient placed in right anterior oblique position and a limited axial scanning through the abdomen pelvis was performed. Appropriate skin entry sites were identified and marked, then prepped with chlorhexidine, draped in usual sterile fashion, infiltrated locally with 1% lidocaine. Under CT fluoroscopic guidance, a 21 gauge trocar needle advanced into a posterior lower pole calyx of the left renal collecting system. 018 guidewire advanced centrally without resistance, confirmed on CT. The transitional dilator was utilized to allow parallel placement of a 038 stiff Amplatz wire. Position confirmed on CT. Tract was dilated to facilitate placement 10 French pigtail drain catheter, formed centrally within the left renal collecting system. 5 mL of purulent urine were aspirated,  sent for Gram stain and culture. In similar fashion, the left retroperitoneal pelvic gas and fluid collection anterior to the psoas was localized. 18 gauge trocar needle was advanced to the collection. Purulent material could be aspirated. A new sterile Amplatz wire advanced easily into the collection, position confirmed on CT. Tract dilated to facilitate placement 12 French pigtail drain catheter, formed centrally within the collection. 10 mL of purulent material were aspirated, sent for Gram stain and culture. Both catheters were secured externally with 0 Prolene sutures and StatLock devices and placed to gravity drain bags. The patient tolerated the procedure well. IMPRESSION: 1. Technically successful left nephrostomy catheter placement with CT guidance. 2. Technically successful left pelvic abscess drain catheter placement with CT guidance. 3. Aspirates from both sites were sent for Gram stain and culture. Electronically Signed   By: Lucrezia Europe M.D.   On: 08/02/2020 08:25   CT IMAGE GUIDED DRAINAGE BY PERCUTANEOUS CATHETER  Result Date: 08/02/2020 INDICATION: Left emphysematous pyelonephritis with obstructing calculus and retroperitoneal abscess EXAM: CT-GUIDED LEFT NEPHROSTOMY PLACEMENT CT-GUIDED PELVIC ABSCESS DRAIN CATHETER PLACEMENT MEDICATIONS: The patient is currently admitted to the hospital and receiving intravenous antibiotics. The antibiotics were administered within an appropriate time frame prior to the initiation of the procedure. ANESTHESIA/SEDATION: Intravenous Fentanyl 40mcg and Versed $RemoveBe'1mg'iFBsjudTm$  were administered as conscious sedation during continuous monitoring of the patient's level of consciousness and physiological / cardiorespiratory status by the radiology RN, with a total moderate sedation time of 42 minutes. COMPLICATIONS: None immediate. PROCEDURE: Informed written consent was obtained from the patient after a thorough discussion of the procedural risks, benefits and alternatives. All  questions were addressed. Maximal Sterile Barrier Technique was utilized including caps, mask, sterile gowns, sterile gloves, sterile drape, hand hygiene and skin antiseptic. A timeout was performed prior to the initiation of the procedure. Patient placed in right anterior oblique position and a limited axial scanning through the abdomen pelvis was performed. Appropriate skin entry sites were identified and marked, then prepped with chlorhexidine, draped in usual sterile fashion, infiltrated locally with 1% lidocaine. Under CT fluoroscopic guidance, a 21 gauge trocar needle advanced into a posterior lower pole calyx of the left renal collecting system. 018 guidewire advanced centrally without resistance, confirmed on CT. The transitional dilator was utilized to allow parallel placement of a 038 stiff Amplatz wire. Position confirmed on CT. Tract was dilated to facilitate placement 10 French pigtail drain catheter, formed centrally within the left renal collecting system. 5 mL of purulent urine were aspirated, sent for Gram stain and culture. In similar fashion, the left retroperitoneal pelvic gas and fluid collection anterior to the psoas was localized. 18 gauge trocar needle was advanced to the collection. Purulent material could be aspirated. A new sterile Amplatz wire advanced easily into the collection, position confirmed on CT. Tract dilated to facilitate placement 12 French pigtail drain catheter, formed centrally within the collection. 10 mL of purulent material were aspirated, sent for Gram stain and culture. Both catheters were secured externally with 0 Prolene sutures and StatLock devices and placed to gravity drain bags. The patient tolerated the procedure well. IMPRESSION: 1. Technically successful left nephrostomy catheter placement with CT guidance. 2. Technically successful left pelvic abscess drain catheter placement with CT guidance. 3. Aspirates from both sites were sent for Gram stain and culture.  Electronically Signed   By: Lucrezia Europe M.D.   On: 08/02/2020 08:25   [place] EKG SITE RITE  Result Date: 08/09/2020 If Site Rite image not attached, placement could not be confirmed due to current cardiac rhythm.      Subjective: Patient seen and examined at the bedside this morning.  Hemodynamically stable for discharge.  Discharge Exam: Vitals:   08/17/20 2204 08/18/20 1325  BP: 139/66 140/69  Pulse: 94 93  Resp: 16 (!) 24  Temp: 98.2 F (36.8 C) 98.8 F (37.1 C)  SpO2: 95% 95%   Vitals:   08/17/20 0454 08/17/20 1304 08/17/20 2204 08/18/20 1325  BP: 132/75 129/69 139/66 140/69  Pulse: 88 93 94 93  Resp: $Remo'16 16 16 'fHGpw$ (!) 24  Temp: 98.4 F (36.9 C) 98.6 F (37 C) 98.2 F (36.8 C) 98.8 F (37.1 C)  TempSrc: Oral Oral Oral Oral  SpO2: 93% 96% 95% 95%  Weight:      Height:        General: Pt is alert, awake, not in acute distress Cardiovascular: RRR, S1/S2 +, no rubs, no gallops Respiratory: CTA bilaterally,  no wheezing, no rhonchi Abdominal: Soft, NT, ND, bowel sounds +, abdominal drains Extremities: no edema, no cyanosis    The results of significant diagnostics from this hospitalization (including imaging, microbiology, ancillary and laboratory) are listed below for reference.     Microbiology: Recent Results (from the past 240 hour(s))  Aerobic/Anaerobic Culture (surgical/deep wound)     Status: None (Preliminary result)   Collection Time: 08/14/20  1:53 PM   Specimen: Abdomen; Abscess  Result Value Ref Range Status   Specimen Description   Final    ABDOMEN Performed at [facility] [address], [place], [place] [address]    Special Requests   Final    NONE Performed at [facility], [place] [address] [place], [place] [address]    Gram Stain   Final    ABUNDANT WBC PRESENT, PREDOMINANTLY PMN NO ORGANISMS SEEN    Culture   Final    NO GROWTH 4 DAYS NO ANAEROBES ISOLATED; CULTURE IN PROGRESS FOR 5 DAYS Performed at  [facility] [address] [place], [place] [address]    Report Status PENDING  Incomplete     Labs: BNP (last 3 results) No results for input(s): BNP in the last 8760 hours. Basic Metabolic Panel: Recent Labs  Lab 08/12/20 0321 08/13/20 0302 08/14/20 0327 08/15/20 0416 08/18/20 0607  NA 140 139 142 139 137  K 3.9 4.2 4.2 4.1 4.1  CL 106 106 110 107 103  CO2 $Re'25 24 23 24 25  'sAJ$ GLUCOSE 127* 115* 113* 120* 139*  BUN $Re'19 20 17 13 15  'vWn$ CREATININE 0.94 0.84 0.94 0.80 0.85  CALCIUM 8.2* 8.1* 8.4* 8.1* 8.6*  MG 1.7  --  1.3* 2.3 1.4*   Liver Function Tests: Recent Labs  Lab 08/18/20 0607  AST 17  ALT 9  ALKPHOS 147*  BILITOT 0.2*  PROT 6.0*  ALBUMIN 2.4*   No results for input(s): LIPASE, AMYLASE in the last 168 hours. No results for input(s): AMMONIA in the last 168 hours. CBC: Recent Labs  Lab 08/12/20 0321 08/13/20 0302 08/15/20 0416 08/18/20 0607  WBC 6.2 5.5 6.2 7.9  NEUTROABS 4.4  --  4.0 4.9  HGB 8.7* 8.3* 8.8* 8.8*  HCT 27.8* 26.2* 28.0* 27.6*  MCV 96.5 97.4 97.2 96.2  PLT 283 311 368 604*   Cardiac Enzymes: No results for input(s): CKTOTAL, CKMB, CKMBINDEX, TROPONINI in the last 168 hours. BNP: Invalid input(s): POCBNP CBG: Recent Labs  Lab 08/17/20 1133 08/17/20 1643 08/17/20 2101 08/18/20 0833 08/18/20 1149  GLUCAP 111* 122* 132* 140* 130*   D-Dimer No results for input(s): DDIMER in the last 72 hours. Hgb A1c No results for input(s): HGBA1C in the last 72 hours. Lipid Profile No results for input(s): CHOL, HDL, LDLCALC, TRIG, CHOLHDL, LDLDIRECT in the last 72 hours. Thyroid function studies No results for input(s): TSH, T4TOTAL, T3FREE, THYROIDAB in the last 72 hours.  Invalid input(s): FREET3 Anemia work up No results for input(s): VITAMINB12, FOLATE, FERRITIN, TIBC, IRON, RETICCTPCT in the last 72 hours. Urinalysis    Component Value Date/Time   COLORURINE YELLOW 08/01/2020 1200   APPEARANCEUR HAZY (A) 08/01/2020 1200    LABSPEC 1.011 08/01/2020 1200   PHURINE 5.0 08/01/2020 1200   GLUCOSEU 50 (A) 08/01/2020 1200   HGBUR LARGE (A) 08/01/2020 1200   BILIRUBINUR NEGATIVE 08/01/2020 1200   KETONESUR NEGATIVE 08/01/2020 1200   PROTEINUR 30 (A) 08/01/2020 1200   NITRITE NEGATIVE 08/01/2020 1200   LEUKOCYTESUR MODERATE (  A) 08/01/2020 1200   Sepsis Labs Invalid input(s): PROCALCITONIN,  WBC,  LACTICIDVEN Microbiology Recent Results (from the past 240 hour(s))  Aerobic/Anaerobic Culture (surgical/deep wound)     Status: None (Preliminary result)   Collection Time: 08/14/20  1:53 PM   Specimen: Abdomen; Abscess  Result Value Ref Range Status   Specimen Description   Final    ABDOMEN Performed at [facility] [address], [place], [place] [address]    Special Requests   Final    NONE Performed at [facility], [place] [address], [place], [place] [address]    Gram Stain   Final    ABUNDANT WBC PRESENT, PREDOMINANTLY PMN NO ORGANISMS SEEN    Culture   Final    NO GROWTH 4 DAYS NO ANAEROBES ISOLATED; CULTURE IN PROGRESS FOR 5 DAYS Performed at [facility] [address], [place], [place] [address]    Report Status PENDING  Incomplete    Please note: You were cared for by a hospitalist during your hospital stay. Once you are discharged, your primary care physician will handle any further medical issues. Please note that NO REFILLS for any discharge medications will be authorized once you are discharged, as it is imperative that you return to your primary care physician (or establish a relationship with a primary care physician if you do not have one) for your post hospital discharge needs so that they can reassess your need for medications and monitor your lab values.    Time coordinating discharge: 40 minutes  SIGNED:   Shelly Coss, MD  [facility] 08/18/2020, 1:44 PM Pager [phone]  If 7PM-7AM, please contact  night-coverage [email or web address] Password [email or web address]

## 2020-08-18 NOTE — Progress Notes (Signed)
Pt was for d/c home. However as IV team was attempting to deaccess picc.Tammie [name] She was unable to flush. MD notified TPA ordered and initialed by IV team. This will be a 2 hour process. Will have oncoming nurse ensure that site can flush and have her give pm dose with hopes that the patient can still be d/c.

## 2020-08-18 NOTE — Progress Notes (Signed)
Inpatient Diabetes Program Recommendations  AACE/ADA: New Consensus Statement on Inpatient Glycemic Control (2015)  Target Ranges:  Prepandial:   less than 140 mg/dL      Peak postprandial:   less than 180 mg/dL (1-2 hours)      Critically ill patients:  140 - 180 mg/dL   Lab Results  Component Value Date   GLUCAP 130 (H) 08/18/2020   HGBA1C 12.2 (H) 08/01/2020    Review of Glycemic Control  CBGs 139, 140, 130 mg/dL Blood sugars excellent!  Reviewed insulin pen administration and pt demonstrated. Discussed checking blood sugars at least 3x/day, eating healthy diet with limited sweets, getting daily exercise and f/u with MD.   Current orders for Inpatient glycemic control: Lantus 8 units bid, Novolog 0-20 units tidwc and 0-5 units QHS  Inpatient Diabetes Program Recommendations:     For discharge:  Lantus 8 units bid (# [id number]) Novolog 0-15 units tid (give sliding scale for home) (#[id number]) Blood glucose meter kit (# [id number]) Insulin pen needles (#[id number])  Pt to f/u with Endo in [place].  Thank you. Lorenda Peck, RD, LDN, CDE Inpatient Diabetes Coordinator [phone]

## 2020-08-18 NOTE — Progress Notes (Signed)
Nutrition Note  RD consulted for nutrition education regarding diabetes.   Lab Results  Component Value Date   HGBA1C 12.2 (H) 08/01/2020    RD provided "Carbohydrate Counting for People with Diabetes" handout from the Academy of Nutrition and Dietetics. Discussed different food groups and their effects on blood sugar, emphasizing carbohydrate-containing foods. Provided list of carbohydrates and recommended serving sizes of common foods.  Discussed importance of controlled and consistent carbohydrate intake throughout the day. Provided examples of ways to balance meals/snacks and encouraged intake of high-fiber, whole grain complex carbohydrates. Teach back method used.  Expect good compliance. Pt's husband at bedside during education.  Body mass index is 31.29 kg/m. Pt meets criteria for obesity based on current BMI.  Current diet order is CHO modified, patient is consuming approximately 100% of meals at this time. Labs and medications reviewed. No further nutrition interventions warranted at this time. If additional nutrition issues arise, please re-consult RD.  Tammie Franco, MS, RD, LDN Inpatient Clinical Dietitian Contact information available via Amion

## 2020-08-18 NOTE — Progress Notes (Signed)
Urology Inpatient Progress Report          Intv/Subj: No acute events overnight.  Repeat CT scan of the weekend showed stable left kidney and stable retroperitoneal infection.  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 with type 2 diabetes mellitus (HCC)   Ileus (HCC)   Emphysematous pyelonephritis of left kidney   Pleural effusion  Current Facility-Administered Medications  Medication Dose Route Frequency Provider Last Rate Last Admin  . 0.9 %  sodium chloride infusion   Intravenous PRN Lurene Shadow, MD 10 mL/hr at 08/12/20 2301 250 mL at 08/12/20 2301  . acetaminophen (TYLENOL) tablet 650 mg  650 mg Oral Q4H PRN Rodolph Bong, MD   650 mg at 08/14/20 2106  . albuterol (PROVENTIL) (2.5 MG/3ML) 0.083% nebulizer solution 2.5 mg  2.5 mg Nebulization Q4H PRN Simonne Martinet, NP      . ceFAZolin (ANCEF) IVPB 2g/100 mL premix  2 g Intravenous Q8H Simonne Martinet, NP 200 mL/hr at 08/18/20 0650 2 g at 08/18/20 0650  . Chlorhexidine Gluconate Cloth 2 % PADS 6 each  6 each Topical Daily Simonne Martinet, NP   6 each at 08/16/20 [phone]  . dextrose 50 % solution 0-50 mL  0-50 mL Intravenous PRN Simonne Martinet, NP      . feeding supplement (GLUCERNA SHAKE) (GLUCERNA SHAKE) liquid 237 mL  237 mL Oral BID BM Rodolph Bong, MD   237 mL at 08/13/20 0908  . fentaNYL (SUBLIMAZE) injection   Intravenous PRN Bennie Dallas, MD   50 mcg at 08/14/20 1322  . insulin aspart (novoLOG) injection 0-20 Units  0-20 Units Subcutaneous TID  WC Simonne Martinet, NP   3 Units at 08/17/20 1757  . insulin aspart (novoLOG) injection 0-5 Units  0-5 Units Subcutaneous QHS Simonne Martinet, NP      . insulin glargine (LANTUS) injection 8 Units  8 Units Subcutaneous BID Rodolph Bong, MD   8 Units at 08/17/20 2240  . magnesium sulfate IVPB 2 g 50 mL  2 g Intravenous Once Burnadette Pop, MD      . midazolam (VERSED) injection   Intravenous PRN Bennie Dallas, MD   1 mg at 08/14/20 1323  . ondansetron (ZOFRAN) tablet 4 mg  4 mg Oral Q6H PRN Simonne Martinet, NP       Or  . ondansetron (ZOFRAN) injection 4 mg  4 mg Intravenous Q6H PRN Simonne Martinet, NP      . polyvinyl alcohol (LIQUIFILM TEARS) 1.4 % ophthalmic solution 1 drop  1 drop Both Eyes PRN Rodolph Bong, MD      . sodium chloride 0.9 % bolus 1,588 mL  20 mL/kg Intravenous Once Zenia Resides E, NP      . sodium chloride flush (NS) 0.9 % injection 10-40 mL  10-40 mL Intracatheter PRN Kasandra Knudsen D, MD   10 mL at 08/13/20 1724  . sodium chloride flush (NS) 0.9 % injection 5 mL  5  mL Intracatheter Q8H Simonne MartinetBabcock, Peter E, NP   5 mL at 08/18/20 [phone]  . sodium chloride flush (NS) 0.9 % injection 5 mL  5 mL Intracatheter Q8H Suttle, Thressa Shellerylan J, MD   5 mL at 08/18/20 0651     Objective: Vital: Vitals:   08/16/20 2037 08/17/20 0454 08/17/20 1304 08/17/20 2204  BP: (!) 144/80 132/75 129/69 139/66  Pulse: 99 88 93 94  Resp: 16 16 16 16   Temp: 98 F (36.7 C) 98.4 F (36.9 C) 98.6 F (37 C) 98.2 F (36.8 C)  TempSrc:  Oral Oral Oral  SpO2: 93% 93% 96% 95%  Weight:      Height:       I/Os: I/O last 3 completed shifts: In: 710 [P.O.:600; Other:10; IV Piggyback:100] Out: 3680 [Urine:3165; Drains:515]  Physical Exam:  General: Patient is in no apparent distress Lungs: Normal respiratory effort, chest expands symmetrically. GI: The abdomen is soft and nontender Left PCN tube with yellow urine with debris Left perinephric drain to bag with purulent drainage Left  perinephric drain to bulb suction with purulent drainage Foley: Removed Ext: lower extremities symmetric  Lab Results: Recent Labs    08/18/20 0607  WBC 7.9  HGB 8.8*  HCT 27.6*   Recent Labs    08/18/20 0607  NA 137  K 4.1  CL 103  CO2 25  GLUCOSE 139*  BUN 15  CREATININE 0.85  CALCIUM 8.6*   No results for input(s): LABPT, INR in the last 72 hours. No results for input(s): LABURIN in the last 72 hours. Results for orders placed or performed during the hospital encounter of 07/31/20  Culture, blood (single)     Status: Abnormal   Collection Time: 07/31/20  5:26 PM   Specimen: BLOOD  Result Value Ref Range Status   Specimen Description   Final    BLOOD LEFT ANTECUBITAL Performed at [facility], [address] [place], [place] [address]    Special Requests   Final    BOTTLES DRAWN AEROBIC AND ANAEROBIC Blood Culture adequate volume Performed at [facility], [address] [place], [place] [address]    Culture  Setup Time   Final    GRAM NEGATIVE RODS AEROBIC AND ANEROBIC BOTTLES Gram Stain Report Called to,Read Back By and Verified With: LARIMORE, A. 12/24 @ 0740 BEARD, S.  CRITICAL RESULT CALLED TO, READ BACK BY AND VERIFIED WITH: PHARMD  Lucita LoraERRY G. [id number] FCP Performed at [facility], [address] [place], [place] [address]    Culture ESCHERICHIA COLI (A)  Final   Report Status 08/03/2020 FINAL  Final   Organism ID, Bacteria ESCHERICHIA COLI  Final      Susceptibility   Escherichia coli - MIC*    AMPICILLIN <=2 SENSITIVE Sensitive     CEFAZOLIN <=4 SENSITIVE Sensitive     CEFEPIME <=0.12 SENSITIVE Sensitive     CEFTAZIDIME <=1 SENSITIVE Sensitive     CEFTRIAXONE <=0.25 SENSITIVE Sensitive     CIPROFLOXACIN <=0.25 SENSITIVE Sensitive     GENTAMICIN <=1 SENSITIVE Sensitive     IMIPENEM <=0.25 SENSITIVE Sensitive     TRIMETH/SULFA <=20 SENSITIVE Sensitive     AMPICILLIN/SULBACTAM <=2 SENSITIVE Sensitive     PIP/TAZO <=4 SENSITIVE Sensitive      * ESCHERICHIA COLI  Blood Culture ID Panel (Reflexed)     Status: Abnormal   Collection Time: 07/31/20  5:26 PM  Result Value Ref Range Status   Enterococcus faecalis NOT DETECTED NOT DETECTED Final  Enterococcus Faecium NOT DETECTED NOT DETECTED Final   Listeria monocytogenes NOT DETECTED NOT DETECTED Final   Staphylococcus species NOT DETECTED NOT DETECTED Final   Staphylococcus aureus (BCID) NOT DETECTED NOT DETECTED Final   Staphylococcus epidermidis NOT DETECTED NOT DETECTED Final   Staphylococcus lugdunensis NOT DETECTED NOT DETECTED Final   Streptococcus species NOT DETECTED NOT DETECTED Final   Streptococcus agalactiae NOT DETECTED NOT DETECTED Final   Streptococcus pneumoniae NOT DETECTED NOT DETECTED Final   Streptococcus pyogenes NOT DETECTED NOT DETECTED Final   A.calcoaceticus-baumannii NOT DETECTED NOT DETECTED Final   Bacteroides fragilis NOT DETECTED NOT DETECTED Final   Enterobacterales DETECTED (A) NOT DETECTED Final    Comment: Enterobacterales represent a large order of gram negative bacteria, not a single organism. CRITICAL RESULT CALLED TO, READ BACK BY AND VERIFIED WITH: PHARMD  TERRY G. [id number] FCP    Enterobacter cloacae complex NOT DETECTED NOT DETECTED Final   Escherichia coli DETECTED (A) NOT DETECTED Final    Comment: CRITICAL RESULT CALLED TO, READ BACK BY AND VERIFIED WITH: PHARMD  TERRY G. [id number] 1921 FCP    Klebsiella aerogenes NOT DETECTED NOT DETECTED Final   Klebsiella oxytoca NOT DETECTED NOT DETECTED Final   Klebsiella pneumoniae NOT DETECTED NOT DETECTED Final   Proteus species NOT DETECTED NOT DETECTED Final   Salmonella species NOT DETECTED NOT DETECTED Final   Serratia marcescens NOT DETECTED NOT DETECTED Final   Haemophilus influenzae NOT DETECTED NOT DETECTED Final   Neisseria meningitidis NOT DETECTED NOT DETECTED Final   Pseudomonas aeruginosa NOT DETECTED NOT DETECTED Final   Stenotrophomonas maltophilia NOT DETECTED NOT  DETECTED Final   Candida albicans NOT DETECTED NOT DETECTED Final   Candida auris NOT DETECTED NOT DETECTED Final   Candida glabrata NOT DETECTED NOT DETECTED Final   Candida krusei NOT DETECTED NOT DETECTED Final   Candida parapsilosis NOT DETECTED NOT DETECTED Final   Candida tropicalis NOT DETECTED NOT DETECTED Final   Cryptococcus neoformans/gattii NOT DETECTED NOT DETECTED Final   CTX-M ESBL NOT DETECTED NOT DETECTED Final   Carbapenem resistance IMP NOT DETECTED NOT DETECTED Final   Carbapenem resistance KPC NOT DETECTED NOT DETECTED Final   Carbapenem resistance NDM NOT DETECTED NOT DETECTED Final   Carbapenem resist OXA 48 LIKE NOT DETECTED NOT DETECTED Final   Carbapenem resistance VIM NOT DETECTED NOT DETECTED Final    Comment: Performed at [facility], [address] [place], [place] [address]  Resp Panel by RT-PCR (Flu A&B, Covid) Nasopharyngeal Swab     Status: None   Collection Time: 07/31/20  5:39 PM   Specimen: Nasopharyngeal Swab; Nasopharyngeal(NP) swabs in vial transport medium  Result Value Ref Range Status   SARS Coronavirus 2 by RT PCR NEGATIVE NEGATIVE Final    Comment: (NOTE) SARS-CoV-2 target nucleic acids are NOT DETECTED.  The SARS-CoV-2 RNA is generally detectable in upper respiratory specimens during the acute phase of infection. The lowest concentration of SARS-CoV-2 viral copies this assay can detect is 138 copies/mL. A negative result does not preclude SARS-Cov-2 infection and should not be used as the sole basis for treatment or other patient management decisions. A negative result may occur with  improper specimen collection/handling, submission of specimen other than nasopharyngeal swab, presence of viral mutation(s) within the areas targeted by this assay, and inadequate number of viral copies(<138 copies/mL). A negative result must be combined with clinical observations, patient history, and epidemiological information. The expected  result is Negative.  Fact Sheet for  Patients:  BloggerCourse.com  Fact Sheet for Healthcare Providers:  SeriousBroker.it  This test is no t yet approved or cleared by the [place] FDA and  has been authorized for detection and/or diagnosis of SARS-CoV-2 by FDA under an Emergency Use Authorization (EUA). This EUA will remain  in effect (meaning this test can be used) for the duration of the COVID-19 declaration under Section 564(b)(1) of the Act, 21 U.S.C.section 360bbb-3(b)(1), unless the authorization is terminated  or revoked sooner.       Influenza A by PCR NEGATIVE NEGATIVE Final   Influenza B by PCR NEGATIVE NEGATIVE Final    Comment: (NOTE) The Xpert Xpress SARS-CoV-2/FLU/RSV plus assay is intended as an aid in the diagnosis of influenza from Nasopharyngeal swab specimens and should not be used as a sole basis for treatment. Nasal washings and aspirates are unacceptable for Xpert Xpress SARS-CoV-2/FLU/RSV testing.  Fact Sheet for Patients: BloggerCourse.com  Fact Sheet for Healthcare Providers: SeriousBroker.it  This test is not yet approved or cleared by the [place] FDA and has been authorized for detection and/or diagnosis of SARS-CoV-2 by FDA under an Emergency Use Authorization (EUA). This EUA will remain in effect (meaning this test can be used) for the duration of the COVID-19 declaration under Section 564(b)(1) of the Act, 21 U.S.C. section 360bbb-3(b)(1), unless the authorization is terminated or revoked.  Performed at [facility], [address], [place], [place] [address]   Culture, blood (single)     Status: None   Collection Time: 07/31/20  8:23 PM   Specimen: BLOOD RIGHT WRIST  Result Value Ref Range Status   Specimen Description BLOOD RIGHT WRIST  Final   Special Requests   Final    BOTTLES DRAWN AEROBIC AND ANAEROBIC Blood Culture results  may not be optimal due to an excessive volume of blood received in culture bottles   Culture   Final    NO GROWTH 5 DAYS Performed at [facility], [address] [place], [place] [address]    Report Status 08/05/2020 FINAL  Final  Urine Culture     Status: Abnormal   Collection Time: 08/01/20 11:28 AM   Specimen: Urine, Clean Catch  Result Value Ref Range Status   Specimen Description   Final    URINE, CLEAN CATCH Performed at [facility], [address] [place], [place] [address]    Special Requests   Final    NONE Performed at [facility], [address], [place], [place] [address]    Culture (A)  Final    <10,000 COLONIES/mL INSIGNIFICANT GROWTH Performed at [facility], [address] [place], [place] [address]    Report Status 08/02/2020 FINAL  Final  MRSA PCR Screening     Status: None   Collection Time: 08/01/20  3:26 PM   Specimen: Nasopharyngeal  Result Value Ref Range Status   MRSA by PCR NEGATIVE NEGATIVE Final    Comment:        The GeneXpert MRSA Assay (FDA approved for NASAL specimens only), is one component of a comprehensive MRSA colonization surveillance program. It is not intended to diagnose MRSA infection nor to guide or monitor treatment for MRSA infections. Performed at [facility], [address] [place], [place] [address]   Aerobic/Anaerobic Culture (surgical/deep wound)     Status: None   Collection Time: 08/01/20  7:37 PM   Specimen: Urine, Catheterized  Result Value Ref Range Status   Specimen Description   Final  URINE, CATHETERIZED URINE FROM DRAIN Performed at [facility], [address], [place], [place] [address]    Special Requests   Final    NONE Performed at [facility], [address], [place], [place] [address]    Gram Stain   Final    ABUNDANT WBC PRESENT, PREDOMINANTLY PMN RARE GRAM NEGATIVE RODS RARE GRAM POSITIVE RODS    Culture   Final    FEW  ESCHERICHIA COLI NO ANAEROBES ISOLATED Performed at [facility], [address] [place], [place] [address]    Report Status 08/07/2020 FINAL  Final   Organism ID, Bacteria ESCHERICHIA COLI  Final      Susceptibility   Escherichia coli - MIC*    AMPICILLIN 4 SENSITIVE Sensitive     CEFAZOLIN <=4 SENSITIVE Sensitive     CEFEPIME <=0.12 SENSITIVE Sensitive     CEFTRIAXONE <=0.25 SENSITIVE Sensitive     CIPROFLOXACIN <=0.25 SENSITIVE Sensitive     GENTAMICIN <=1 SENSITIVE Sensitive     IMIPENEM <=0.25 SENSITIVE Sensitive     NITROFURANTOIN <=16 SENSITIVE Sensitive     TRIMETH/SULFA <=20 SENSITIVE Sensitive     AMPICILLIN/SULBACTAM <=2 SENSITIVE Sensitive     PIP/TAZO <=4 SENSITIVE Sensitive     * FEW ESCHERICHIA COLI  Aerobic/Anaerobic Culture (surgical/deep wound)     Status: None   Collection Time: 08/01/20  7:38 PM   Specimen: Abscess  Result Value Ref Range Status   Specimen Description   Final    ABSCESS Performed at [facility], [address] [place], [place] [address]    Special Requests   Final    NONE Performed at [facility], [address] [place], [place] [address]    Gram Stain   Final    ABUNDANT WBC PRESENT, PREDOMINANTLY PMN MODERATE GRAM NEGATIVE RODS    Culture   Final    ABUNDANT ESCHERICHIA COLI NO ANAEROBES ISOLATED Performed at [facility], [address] [place], [place] [address]    Report Status 08/07/2020 FINAL  Final   Organism ID, Bacteria ESCHERICHIA COLI  Final      Susceptibility   Escherichia coli - MIC*    AMPICILLIN <=2 SENSITIVE Sensitive     CEFAZOLIN <=4 SENSITIVE Sensitive     CEFEPIME <=0.12 SENSITIVE Sensitive     CEFTAZIDIME <=1 SENSITIVE Sensitive     CEFTRIAXONE <=0.25 SENSITIVE Sensitive     CIPROFLOXACIN <=0.25 SENSITIVE Sensitive     GENTAMICIN <=1 SENSITIVE Sensitive     IMIPENEM <=0.25 SENSITIVE Sensitive     TRIMETH/SULFA <=20 SENSITIVE Sensitive      AMPICILLIN/SULBACTAM <=2 SENSITIVE Sensitive     PIP/TAZO <=4 SENSITIVE Sensitive     * ABUNDANT ESCHERICHIA COLI  Aerobic/Anaerobic Culture (surgical/deep wound)     Status: None (Preliminary result)   Collection Time: 08/14/20  1:53 PM   Specimen: Abdomen; Abscess  Result Value Ref Range Status   Specimen Description   Final    ABDOMEN Performed at [facility], [address], [place], [place] [address]    Special Requests   Final    NONE Performed at [facility], [address], [place], [place] [address]    Gram Stain   Final    ABUNDANT WBC PRESENT, PREDOMINANTLY PMN NO ORGANISMS SEEN    Culture   Final    NO GROWTH 3 DAYS NO ANAEROBES ISOLATED; CULTURE IN PROGRESS FOR 5 DAYS Performed at  [facility], [address] [place], [place] [address]    Report Status PENDING  Incomplete    Studies/Results: CT RENAL STONE STUDY  Result Date: 08/16/2020 CLINICAL DATA:  60 year old female with pyelonephritis and renal abscess. Follow-up CT. EXAM: CT ABDOMEN AND PELVIS WITHOUT CONTRAST TECHNIQUE: Multidetector CT imaging of the abdomen and pelvis was performed following the standard protocol without IV contrast. COMPARISON:  CT abdomen pelvis dated 08/13/2020. FINDINGS: Evaluation of this exam is limited in the absence of intravenous contrast. Lower chest: Partially visualized small bilateral pleural effusions and consolidative changes of the lung bases as seen previously. Several pulmonary nodules measure 7 mm in the right middle lobe. No intra-abdominal free air or free fluid. Hepatobiliary: No focal liver abnormality is seen. No gallstones, gallbladder wall thickening, or biliary dilatation. Pancreas: Unremarkable. No pancreatic ductal dilatation or surrounding inflammatory changes. Spleen: Normal in size without focal abnormality. Adrenals/Urinary Tract: The adrenal glands unremarkable. Left percutaneous nephrostomy with pigtail tip in the left  renal pelvis in similar position. There has been interval placement a percutaneous drainage catheter with pigtail tip in the posterior perinephric collection. Slight interval decrease in the size of the perinephric collection compared to prior CT. There is no hydronephrosis or nephrolithiasis on either side. The right kidney is unremarkable. The urinary bladder is decompressed around a Foley catheter. Stomach/Bowel: There is no bowel obstruction or active inflammation. The appendix is not visualized with certainty. No inflammatory changes identified in the right lower quadrant. Vascular/Lymphatic: Mild aortoiliac atherosclerotic disease. The IVC is unremarkable. No portal venous gas. There is no adenopathy. Reproductive: The uterus and ovaries are grossly unremarkable. Other: Mild diffuse subcutaneous edema. There has been interval placement of a percutaneous drainage catheter with tip in the fluid collection along the anterior aspect of the left psoas muscle. The collection has decreased in size now measuring approximately 4.2 x 8.0 cm (previously 4.4 x 8.7 cm). Musculoskeletal: No acute or significant osseous findings. IMPRESSION: 1. Interval placement of a percutaneous drainage catheter with tip in the posterior perinephric collection. Slight interval decrease in the size of the fluid collection compared to prior CT. 2. Decrease in the size of the collection anterior to the left psoas muscle status post percutaneous drainage catheter placement. Near 3. No hydronephrosis or nephrolithiasis. 4. No bowel obstruction. 5. Several pulmonary nodules measure 7 mm in the right middle lobe. 6. Aortic Atherosclerosis (ICD10-I70.0). Electronically Signed   By: Elgie CollardArash  Radparvar M.D.   On: 08/16/2020 20:27    Assessment: 60 year old woman who initially presented with severe emphysematous pyelonephritis now status post left PCN drain and left perinephric abscess drain x2.  White count has normalized as has kidney function.   Repeat CT scan shows stable left kidney with very slight decrease in perinephric abscess.  -Plan for DC with all 3 drains today -Continue IV antibiotics per infectious disease -Patient would like to follow-up in the [place] office is much closer to her house and we will arrange this with Dr. Rayne DuMcKenzie   Davell Beckstead, MD Urology 08/18/2020, 10:50 AM

## 2020-08-19 ENCOUNTER — Other Ambulatory Visit: Payer: Self-pay

## 2020-08-19 DIAGNOSIS — N10 Acute tubulo-interstitial nephritis: Secondary | ICD-10-CM

## 2020-08-19 DIAGNOSIS — R109 Unspecified abdominal pain: Secondary | ICD-10-CM

## 2020-08-19 DIAGNOSIS — N200 Calculus of kidney: Secondary | ICD-10-CM

## 2020-08-19 LAB — AEROBIC/ANAEROBIC CULTURE W GRAM STAIN (SURGICAL/DEEP WOUND): Culture: NO GROWTH

## 2020-08-19 LAB — GLUCOSE, CAPILLARY
Glucose-Capillary: 128 mg/dL — ABNORMAL HIGH (ref 70–99)
Glucose-Capillary: 163 mg/dL — ABNORMAL HIGH (ref 70–99)

## 2020-08-19 MED ORDER — INSULIN STARTER KIT- PEN NEEDLES (ENGLISH)
1.0000 | Freq: Once | Status: DC
  Filled 2020-08-19: qty 1

## 2020-08-19 MED ORDER — HEPARIN SOD (PORK) LOCK FLUSH 100 UNIT/ML IV SOLN
250.0000 [IU] | INTRAVENOUS | Status: AC | PRN
  Administered 2020-08-19 (×2): 250 [IU]
  Filled 2020-08-19: qty 2.5

## 2020-08-19 NOTE — TOC Progression Note (Signed)
Transition of Care [facility]) - Progression Note    Patient Details  Name: Ceanna Wareing MRN: [id number] Date of Birth: Dec 21, 1960  Transition of Care [facility]) CM/SW Contact  Darleene Cleaver, [place] Phone Number: 08/19/2020, 11:17 AM  Clinical Narrative:     Patient discharging today.  Unable to DC yesterday due to Picc line not functioning properly.  See DC note from yesterday.   Expected Discharge Plan: Home/Self Care Barriers to Discharge: Barriers Resolved  Expected Discharge Plan and Services Expected Discharge Plan: Home/Self Care   Discharge Planning Services: CM Consult   Living arrangements for the past 2 months: Single Family Home Expected Discharge Date: 08/19/20               DME Arranged: IV pump/equipment DME Agency: Other - Comment ([facility]) Date DME Agency Contacted: 08/18/20 Time DME Agency Contacted: 1430 Representative spoke with at DME Agency: Jeri Modena HH Arranged: RN HH Agency: Other - See comment ([facility]) Date HH Agency Contacted: 08/18/20 Time HH Agency Contacted: 1431 Representative spoke with at [facility] Agency: [name] spoke to her   Social Determinants of Health (SDOH) Interventions    Readmission Risk Interventions No flowsheet data found.

## 2020-08-19 NOTE — Progress Notes (Signed)
Patient seen and examined at the bedside this morning.  Hemodynamically stable.  Could not be discharged yesterday because PICC line was nonfunctioning.  PICC line functioning again.  No change in the medical management.  Stable for discharge today ,discharge summary and orders are in.

## 2020-08-24 DIAGNOSIS — N12 Tubulo-interstitial nephritis, not specified as acute or chronic: Secondary | ICD-10-CM | POA: Diagnosis not present

## 2020-08-24 DIAGNOSIS — N151 Renal and perinephric abscess: Secondary | ICD-10-CM | POA: Diagnosis not present

## 2020-08-24 DIAGNOSIS — R6520 Severe sepsis without septic shock: Secondary | ICD-10-CM | POA: Diagnosis not present

## 2020-08-24 DIAGNOSIS — J90 Pleural effusion, not elsewhere classified: Secondary | ICD-10-CM | POA: Diagnosis not present

## 2020-08-26 DIAGNOSIS — N12 Tubulo-interstitial nephritis, not specified as acute or chronic: Secondary | ICD-10-CM | POA: Diagnosis not present

## 2020-08-26 DIAGNOSIS — R6520 Severe sepsis without septic shock: Secondary | ICD-10-CM | POA: Diagnosis not present

## 2020-08-26 DIAGNOSIS — J90 Pleural effusion, not elsewhere classified: Secondary | ICD-10-CM | POA: Diagnosis not present

## 2020-08-26 DIAGNOSIS — N151 Renal and perinephric abscess: Secondary | ICD-10-CM | POA: Diagnosis not present

## 2020-08-27 ENCOUNTER — Other Ambulatory Visit: Payer: Self-pay

## 2020-08-31 ENCOUNTER — Other Ambulatory Visit: Payer: Self-pay

## 2020-08-31 ENCOUNTER — Inpatient Hospital Stay (HOSPITAL_COMMUNITY)
Admission: EM | Admit: 2020-08-31 | Discharge: 2020-09-05 | DRG: 871 | Disposition: A | Discharge status: Home Health | Payer: BC Managed Care – PPO | Attending: Internal Medicine | Admitting: Internal Medicine

## 2020-08-31 ENCOUNTER — Emergency Department (HOSPITAL_COMMUNITY): Payer: BC Managed Care – PPO

## 2020-08-31 ENCOUNTER — Encounter (HOSPITAL_COMMUNITY): Payer: Self-pay

## 2020-08-31 DIAGNOSIS — R6520 Severe sepsis without septic shock: Secondary | ICD-10-CM | POA: Diagnosis not present

## 2020-08-31 DIAGNOSIS — R109 Unspecified abdominal pain: Secondary | ICD-10-CM | POA: Diagnosis not present

## 2020-08-31 DIAGNOSIS — D649 Anemia, unspecified: Secondary | ICD-10-CM

## 2020-08-31 DIAGNOSIS — I10 Essential (primary) hypertension: Secondary | ICD-10-CM | POA: Diagnosis not present

## 2020-08-31 DIAGNOSIS — N390 Urinary tract infection, site not specified: Secondary | ICD-10-CM | POA: Diagnosis not present

## 2020-08-31 DIAGNOSIS — R1032 Left lower quadrant pain: Secondary | ICD-10-CM | POA: Diagnosis not present

## 2020-08-31 DIAGNOSIS — T83022A Displacement of nephrostomy catheter, initial encounter: Secondary | ICD-10-CM | POA: Diagnosis present

## 2020-08-31 DIAGNOSIS — R0602 Shortness of breath: Secondary | ICD-10-CM | POA: Diagnosis not present

## 2020-08-31 DIAGNOSIS — E669 Obesity, unspecified: Secondary | ICD-10-CM | POA: Diagnosis not present

## 2020-08-31 DIAGNOSIS — Z20822 Contact with and (suspected) exposure to covid-19: Secondary | ICD-10-CM | POA: Diagnosis present

## 2020-08-31 DIAGNOSIS — A419 Sepsis, unspecified organism: Secondary | ICD-10-CM | POA: Diagnosis not present

## 2020-08-31 DIAGNOSIS — N12 Tubulo-interstitial nephritis, not specified as acute or chronic: Secondary | ICD-10-CM | POA: Diagnosis not present

## 2020-08-31 DIAGNOSIS — Z936 Other artificial openings of urinary tract status: Secondary | ICD-10-CM | POA: Diagnosis not present

## 2020-08-31 DIAGNOSIS — Z4682 Encounter for fitting and adjustment of non-vascular catheter: Secondary | ICD-10-CM | POA: Diagnosis not present

## 2020-08-31 DIAGNOSIS — R778 Other specified abnormalities of plasma proteins: Secondary | ICD-10-CM

## 2020-08-31 DIAGNOSIS — J90 Pleural effusion, not elsewhere classified: Secondary | ICD-10-CM | POA: Diagnosis not present

## 2020-08-31 DIAGNOSIS — I959 Hypotension, unspecified: Secondary | ICD-10-CM | POA: Diagnosis present

## 2020-08-31 DIAGNOSIS — E8809 Other disorders of plasma-protein metabolism, not elsewhere classified: Secondary | ICD-10-CM | POA: Diagnosis not present

## 2020-08-31 DIAGNOSIS — Z90710 Acquired absence of both cervix and uterus: Secondary | ICD-10-CM | POA: Diagnosis not present

## 2020-08-31 DIAGNOSIS — Z885 Allergy status to narcotic agent status: Secondary | ICD-10-CM | POA: Diagnosis not present

## 2020-08-31 DIAGNOSIS — D638 Anemia in other chronic diseases classified elsewhere: Secondary | ICD-10-CM | POA: Diagnosis not present

## 2020-08-31 DIAGNOSIS — N151 Renal and perinephric abscess: Secondary | ICD-10-CM | POA: Diagnosis not present

## 2020-08-31 DIAGNOSIS — I517 Cardiomegaly: Secondary | ICD-10-CM | POA: Diagnosis not present

## 2020-08-31 DIAGNOSIS — Z6830 Body mass index (BMI) 30.0-30.9, adult: Secondary | ICD-10-CM

## 2020-08-31 DIAGNOSIS — Z794 Long term (current) use of insulin: Secondary | ICD-10-CM | POA: Diagnosis not present

## 2020-08-31 DIAGNOSIS — J9811 Atelectasis: Secondary | ICD-10-CM | POA: Diagnosis not present

## 2020-08-31 DIAGNOSIS — E1165 Type 2 diabetes mellitus with hyperglycemia: Secondary | ICD-10-CM | POA: Diagnosis not present

## 2020-08-31 DIAGNOSIS — Z978 Presence of other specified devices: Secondary | ICD-10-CM | POA: Diagnosis not present

## 2020-08-31 DIAGNOSIS — K651 Peritoneal abscess: Secondary | ICD-10-CM | POA: Diagnosis not present

## 2020-08-31 DIAGNOSIS — N1330 Unspecified hydronephrosis: Secondary | ICD-10-CM | POA: Diagnosis present

## 2020-08-31 DIAGNOSIS — K6819 Other retroperitoneal abscess: Secondary | ICD-10-CM | POA: Diagnosis present

## 2020-08-31 DIAGNOSIS — N10 Acute tubulo-interstitial nephritis: Secondary | ICD-10-CM | POA: Diagnosis not present

## 2020-08-31 DIAGNOSIS — B9620 Unspecified Escherichia coli [E. coli] as the cause of diseases classified elsewhere: Secondary | ICD-10-CM | POA: Diagnosis not present

## 2020-08-31 DIAGNOSIS — E876 Hypokalemia: Secondary | ICD-10-CM | POA: Diagnosis not present

## 2020-08-31 HISTORY — DX: Acute pyelonephritis: N10

## 2020-08-31 HISTORY — DX: Pleural effusion, not elsewhere classified: J90

## 2020-08-31 HISTORY — DX: Renal and perinephric abscess: N15.1

## 2020-08-31 HISTORY — DX: Other nonspecific abnormal finding of lung field: R91.8

## 2020-08-31 LAB — LACTIC ACID, PLASMA: Lactic Acid, Venous: 1.1 mmol/L (ref 0.5–1.9)

## 2020-08-31 LAB — PROTIME-INR
INR: 1.2 (ref 0.8–1.2)
Prothrombin Time: 14.4 seconds (ref 11.4–15.2)

## 2020-08-31 LAB — CBC WITH DIFFERENTIAL/PLATELET
Abs Immature Granulocytes: 0.23 10*3/uL — ABNORMAL HIGH (ref 0.00–0.07)
Basophils Absolute: 0.1 10*3/uL (ref 0.0–0.1)
Basophils Relative: 0 %
Eosinophils Absolute: 0.1 10*3/uL (ref 0.0–0.5)
Eosinophils Relative: 1 %
HCT: 26.7 % — ABNORMAL LOW (ref 36.0–46.0)
Hemoglobin: 8.5 g/dL — ABNORMAL LOW (ref 12.0–15.0)
Immature Granulocytes: 1 %
Lymphocytes Relative: 3 %
Lymphs Abs: 0.7 10*3/uL (ref 0.7–4.0)
MCH: 29.8 pg (ref 26.0–34.0)
MCHC: 31.8 g/dL (ref 30.0–36.0)
MCV: 93.7 fL (ref 80.0–100.0)
Monocytes Absolute: 0.6 10*3/uL (ref 0.1–1.0)
Monocytes Relative: 3 %
Neutro Abs: 19.7 10*3/uL — ABNORMAL HIGH (ref 1.7–7.7)
Neutrophils Relative %: 92 %
Platelets: 385 10*3/uL (ref 150–400)
RBC: 2.85 MIL/uL — ABNORMAL LOW (ref 3.87–5.11)
RDW: 14.3 % (ref 11.5–15.5)
WBC: 21.5 10*3/uL — ABNORMAL HIGH (ref 4.0–10.5)
nRBC: 0 % (ref 0.0–0.2)

## 2020-08-31 LAB — APTT: aPTT: 34 seconds (ref 24–36)

## 2020-08-31 MED ORDER — PIPERACILLIN-TAZOBACTAM 3.375 G IVPB 30 MIN
3.3750 g | Freq: Once | INTRAVENOUS | Status: AC
  Administered 2020-08-31: 3.375 g via INTRAVENOUS
  Filled 2020-08-31: qty 50

## 2020-08-31 MED ORDER — VANCOMYCIN HCL 1500 MG/300ML IV SOLN
1500.0000 mg | Freq: Once | INTRAVENOUS | Status: AC
  Administered 2020-08-31: 1500 mg via INTRAVENOUS
  Filled 2020-08-31: qty 300

## 2020-08-31 MED ORDER — PIPERACILLIN-TAZOBACTAM 3.375 G IVPB: 3.3750 g | Freq: Three times a day (TID) | INTRAVENOUS | Status: DC

## 2020-08-31 MED ORDER — LACTATED RINGERS IV BOLUS
30.0000 mL/kg | Freq: Once | INTRAVENOUS | Status: AC
  Administered 2020-08-31: 2232 mL via INTRAVENOUS

## 2020-08-31 MED ORDER — VANCOMYCIN HCL 750 MG/150ML IV SOLN
750.0000 mg | Freq: Two times a day (BID) | INTRAVENOUS | Status: DC
  Administered 2020-09-01 (×2): 750 mg via INTRAVENOUS
  Filled 2020-08-31 (×5): qty 150

## 2020-08-31 NOTE — Progress Notes (Signed)
Pharmacy Antibiotic Note  Tammie [name] is a 60 y.o. female admitted on 08/31/2020 with sepsis.  Pharmacy has been consulted for vancomycin and zosyn dosing.  Plan: Zosyn 3.375 gm IV q8 hours Vancomycin 1500 mg IV x 1 then 750 q12 F/u renal function, cultures and clinical course  Height: 5\' 1"  (154.9 cm) Weight: 74.4 kg (164 lb) IBW/kg (Calculated) : 47.8  Temp (24hrs), Avg:99.3 F (37.4 C), Min:99.3 F (37.4 C), Max:99.3 F (37.4 C)  Recent Labs  Lab 08/31/20 1916  WBC 21.5*  LATICACIDVEN 1.1    Estimated Creatinine Clearance: 65.7 mL/min (by C-G formula based on SCr of 0.85 mg/dL).    Allergies  Allergen Reactions  . Codeine Rash    Thank you for allowing pharmacy to be a part of this patient's care.  09/02/20 08/31/2020 10:31 PM

## 2020-08-31 NOTE — ED Provider Notes (Signed)
Care assumed from Dr. Renaye Rakers, patient with percutaneous drains from nephrostomy and intra-abdominal abscess comes in with chills and concern for sepsis, markedly elevated WBC, started on antibiotics.  Currently pending labs and CT of abdomen and pelvis, will need admission.  Labs show marked leukocytosis with left shift, stable anemia, moderate to severe hypokalemia, normal renal function, elevated alkaline phosphatase which is increased compared with 2 weeks ago - oral potassium has been ordered.  Troponin is mildly elevated and stable over several hours, likely representing demand ischemia and not a non-STEMI.  CT of abdomen and pelvis shows left perinephric abscess and pararenal abscess with drainage catheters that appear to be in appropriate position.  Lactic acid level is normal.  Urinalysis does show WBC clumps with culture pending.  Given chills, marked leukocytosis, and evidence of ongoing infection with urine, I feel she does need hospitalization.  Case is discussed with Dr. Robb Matar of [facility], who agrees to admit the patient.  Results for orders placed or performed during the hospital encounter of 08/31/20  Blood Culture (routine x 2)   Specimen: Peripheral; Blood  Result Value Ref Range   Specimen Description BLOOD LEFT WRIST    Special Requests      BOTTLES DRAWN AEROBIC AND ANAEROBIC Blood Culture adequate volume Performed at [facility], [address], [place], [place] [address]    Culture PENDING    Report Status PENDING   SARS Coronavirus 2 by RT PCR (hospital order, performed in [facility] hospital lab) Nasopharyngeal Nasopharyngeal Swab   Specimen: Nasopharyngeal Swab  Result Value Ref Range   SARS Coronavirus 2 NEGATIVE NEGATIVE  Lactic acid, plasma  Result Value Ref Range   Lactic Acid, Venous 1.1 0.5 - 1.9 mmol/L  CBC with Differential  Result Value Ref Range   WBC 21.5 (H) 4.0 - 10.5 K/uL   RBC 2.85 (L) 3.87 - 5.11 MIL/uL   Hemoglobin 8.5 (L) 12.0 - 15.0  g/dL   HCT 60.4 (L) 54.0 - 98.1 %   MCV 93.7 80.0 - 100.0 fL   MCH 29.8 26.0 - 34.0 pg   MCHC 31.8 30.0 - 36.0 g/dL   RDW 19.1 47.8 - 29.5 %   Platelets 385 150 - 400 K/uL   nRBC 0.0 0.0 - 0.2 %   Neutrophils Relative % 92 %   Neutro Abs 19.7 (H) 1.7 - 7.7 K/uL   Lymphocytes Relative 3 %   Lymphs Abs 0.7 0.7 - 4.0 K/uL   Monocytes Relative 3 %   Monocytes Absolute 0.6 0.1 - 1.0 K/uL   Eosinophils Relative 1 %   Eosinophils Absolute 0.1 0.0 - 0.5 K/uL   Basophils Relative 0 %   Basophils Absolute 0.1 0.0 - 0.1 K/uL   Immature Granulocytes 1 %   Abs Immature Granulocytes 0.23 (H) 0.00 - 0.07 K/uL  Urinalysis, Routine w reflex microscopic Urine, Clean Catch  Result Value Ref Range   Color, Urine AMBER (A) YELLOW   APPearance TURBID (A) CLEAR   Specific Gravity, Urine 1.006 1.005 - 1.030   pH 6.0 5.0 - 8.0   Glucose, UA 50 (A) NEGATIVE mg/dL   Hgb urine dipstick MODERATE (A) NEGATIVE   Bilirubin Urine NEGATIVE NEGATIVE   Ketones, ur NEGATIVE NEGATIVE mg/dL   Protein, ur 621 (A) NEGATIVE mg/dL   Nitrite NEGATIVE NEGATIVE   Leukocytes,Ua MODERATE (A) NEGATIVE   RBC / HPF 21-50 0 - 5 RBC/hpf   WBC, UA 11-20 0 - 5 WBC/hpf   Bacteria, UA FEW (  A) NONE SEEN   WBC Clumps PRESENT    Non Squamous Epithelial 11-20 (A) NONE SEEN  Comprehensive metabolic panel  Result Value Ref Range   Sodium 137 135 - 145 mmol/L   Potassium 3.0 (L) 3.5 - 5.1 mmol/L   Chloride 100 98 - 111 mmol/L   CO2 25 22 - 32 mmol/L   Glucose, Bld 165 (H) 70 - 99 mg/dL   BUN 10 6 - 20 mg/dL   Creatinine, Ser 1.61 0.44 - 1.00 mg/dL   Calcium 8.7 (L) 8.9 - 10.3 mg/dL   Total Protein 6.5 6.5 - 8.1 g/dL   Albumin 2.7 (L) 3.5 - 5.0 g/dL   AST 15 15 - 41 U/L   ALT 9 0 - 44 U/L   Alkaline Phosphatase 207 (H) 38 - 126 U/L   Total Bilirubin 0.6 0.3 - 1.2 mg/dL   GFR, Estimated >09 >60 mL/min   Anion gap 12 5 - 15  Protime-INR  Result Value Ref Range   Prothrombin Time 14.4 11.4 - 15.2 seconds   INR 1.2 0.8 - 1.2   APTT  Result Value Ref Range   aPTT 34 24 - 36 seconds  Basic metabolic panel  Result Value Ref Range   Sodium 135 135 - 145 mmol/L   Potassium 2.9 (L) 3.5 - 5.1 mmol/L   Chloride 99 98 - 111 mmol/L   CO2 26 22 - 32 mmol/L   Glucose, Bld 187 (H) 70 - 99 mg/dL   BUN 11 6 - 20 mg/dL   Creatinine, Ser 4.54 0.44 - 1.00 mg/dL   Calcium 8.4 (L) 8.9 - 10.3 mg/dL   GFR, Estimated >09 >81 mL/min   Anion gap 10 5 - 15  Hepatic function panel  Result Value Ref Range   Total Protein 6.4 (L) 6.5 - 8.1 g/dL   Albumin 2.5 (L) 3.5 - 5.0 g/dL   AST 15 15 - 41 U/L   ALT 9 0 - 44 U/L   Alkaline Phosphatase 205 (H) 38 - 126 U/L   Total Bilirubin 0.6 0.3 - 1.2 mg/dL   Bilirubin, Direct 0.1 0.0 - 0.2 mg/dL   Indirect Bilirubin 0.5 0.3 - 0.9 mg/dL  Troponin I (High Sensitivity)  Result Value Ref Range   Troponin I (High Sensitivity) 26 (H) <18 ng/L  Troponin I (High Sensitivity)  Result Value Ref Range   Troponin I (High Sensitivity) 23 (H) <18 ng/L   CT ABDOMEN PELVIS W WO CONTRAST  Result Date: 08/13/2020 CLINICAL DATA:  Complicated pyelonephritis.  Abscess. EXAM: CT ABDOMEN AND PELVIS WITHOUT AND WITH CONTRAST TECHNIQUE: Multidetector CT imaging of the abdomen and pelvis was performed following the standard protocol before and following the bolus administration of intravenous contrast. CONTRAST:  OMNIPAQUE IOHEXOL 300 MG/ML  SOLN COMPARISON:  08/08/2020 FINDINGS: Lower Chest: Small bilateral pleural effusions are again seen with atelectasis or consolidation in the posterior lower lobes. Multiple small pulmonary nodules are again seen in the visualized portion of the right lower lung, without significant change. Hepatobiliary: No hepatic masses identified. Gallbladder is unremarkable. No evidence of biliary ductal dilatation. Pancreas:  No mass or inflammatory changes. Spleen: Within normal limits in size and appearance. Adrenals/Urinary Tract: A left percutaneous nephrostomy tube is seen in  appropriate position and there is no evidence of hydronephrosis. Diffuse left renal swelling is again seen with multiple small irregular fluid density collections throughout the renal parenchyma, and a perinephric fluid and gas collection is again seen along the lateral and  posterior margins of the kidney. This measures 9.4 x 4.3 cm on image 29/7, increased from 7.4 x 2.5 cm when remeasured in same planes on prior exam. A 2nd percutaneous drainage catheter is seen in the left lower quadrant along the anterior aspect of the left psoas muscle. Left retroperitoneal fluid collection in this region measures 8.7 x 4.4 cm on image 54/2, without significant change. A 3rd fluid and gas collection is seen in the left pararenal space which measures 6.7 x 2.7 cm on image 46/7, without significant change in size since prior exam. Foley catheter is noted within the urinary bladder. Stomach/Bowel: No evidence of obstruction, inflammatory process or abnormal fluid collections. Vascular/Lymphatic: No pathologically enlarged lymph nodes. No abdominal aortic aneurysm. Reproductive:  No mass or other significant abnormality. Other:  None. Musculoskeletal:  No suspicious bone lesions identified. IMPRESSION: Left percutaneous nephrostomy tube in appropriate position. No evidence of hydronephrosis. Stable diffuse left renal parenchymal swelling with multiple small internal fluid density collections. Mild increase in size of left perinephric fluid and gas collection. No significant change in size of fluid and gas collections along the anterior aspect of left psoas muscle, and in the left lateral pararenal space. Stable small bilateral pleural effusions and posterior lower lobe atelectasis or consolidation. Stable small right lower lung pulmonary nodules. Electronically Signed   By: Danae Orleans M.D.   On: 08/13/2020 08:27   DG Chest 2 View  Result Date: 08/31/2020 CLINICAL DATA:  Shortness of breath. Possible PICC line infection. On  antibiotics. EXAM: CHEST - 2 VIEW COMPARISON:  08/07/2020 and CT of 08/08/2020. FINDINGS: Left upper quadrant percutaneous drains. A right-sided PICC line terminates at the low SVC. Midline trachea. Mild cardiomegaly. Trace right pleural effusion. No pneumothorax. The dominant right upper lobe septic embolus measuring 2.4 cm today versus 3.1 cm on 08/07/2020 (when remeasured). The underlying pattern of diffuse reticulonodular opacification is difficult to directly compare to the prior AP radiograph, but may be slightly improved. No lobar consolidation. Mild left base subsegmental atelectasis. IMPRESSION: Reticulonodular opacification throughout both lungs, consistent with septic emboli when compared to 08/08/2020 CT. This may be slightly improved compared to a on a AP radiographs most recent 08/07/2020. The right upper lobe dominant lesion is slightly decreased in size. Trace right pleural effusion. Electronically Signed   By: Jeronimo Greaves M.D.   On: 08/31/2020 18:42   DG Chest 2 View  Result Date: 08/07/2020 CLINICAL DATA:  Sepsis.  Left pyelonephritis. EXAM: CHEST - 2 VIEW COMPARISON:  08/04/2020 FINDINGS: Stable cardiomegaly. Small left pleural effusion and left basilar atelectasis or infiltrate show no significant change. Probable small layering right pleural effusion also noted. Increased airspace opacity is seen in the right upper lobe since prior study. IMPRESSION: Increased right upper lobe airspace opacity. Stable small left pleural effusion, and left basilar atelectasis versus infiltrate. Probable small layering right pleural effusion. Electronically Signed   By: Danae Orleans M.D.   On: 08/07/2020 12:54   CT ABDOMEN PELVIS W CONTRAST  Result Date: 09/01/2020 CLINICAL DATA:  Abdominal pain, fever EXAM: CT ABDOMEN AND PELVIS WITH CONTRAST TECHNIQUE: Multidetector CT imaging of the abdomen and pelvis was performed using the standard protocol following bolus administration of intravenous contrast.  CONTRAST:  OMNIPAQUE IOHEXOL 300 MG/ML  SOLN COMPARISON:  08/16/2020, 08/13/2020 FINDINGS: Lower chest: Subpleural nodule within the visualized right lung base and atelectasis within the left lung base are unchanged. Previously noted pleural effusions have resolved. The visualized heart and pericardium are unremarkable. Hepatobiliary: No  focal liver abnormality is seen. No gallstones, gallbladder wall thickening, or biliary dilatation. Pancreas: Unremarkable Spleen: Unremarkable Adrenals/Urinary Tract: The adrenal glands are unremarkable. The kidneys are normal in size and position. The right kidney is unremarkable. Percutaneous nephrostomy is seen within the left kidney, unchanged with its retaining loop formed within the left renal pelvis. Percutaneous pigtail drainage catheter is seen within the a posterior perinephric fluid collection, but has been partially withdrawn with its retaining loop now just within the loculated collection. The collection is slightly improved in overall size, now measuring 3.9 x 8.6 by 13.2 cm in greatest dimension on axial image # 29 and sagittal reformat # 105. Punctate foci of gas are again seen within the collection, though this appears reduced when compared to prior examination. A second collection is seen within the posterior left pararenal space. This U shaped collection measures roughly 11.2 x 9.7 cm at its base in the region of the percutaneous drainage catheter, axial image # 52. Superolaterally, this measures 4.2 x 11.1 cm at axial image # 47. These appear unchanged from prior examination, though appear improved when compared to remote prior examination of 08/08/2020. Pigtail drainage catheter within the basilar component appears unchanged in position. Stomach/Bowel: Stomach, small bowel, and large bowel are unremarkable. Appendix normal. No free intraperitoneal gas or fluid. Vascular/Lymphatic: The abdominal vasculature is unremarkable. Shotty left periaortic  lymphadenopathy likely represents reactive adenopathy. No frankly pathologic adenopathy within the abdomen and pelvis. Reproductive: Uterus and bilateral adnexa are unremarkable. Other: No abdominal wall hernia.  Rectum unremarkable. Musculoskeletal: No acute bone abnormality. No lytic or blastic bone lesions are seen. IMPRESSION: Left perinephric abscess has slightly decreased in size when compared to prior examination. Percutaneous drainage catheter is seen within the collection, though has been partially withdrawn when compared to prior exam. Left posterior pararenal U shaped abscess appears unchanged when compared to prior examination. Percutaneous drainage catheter appears appropriately position within the dependent component of the collection, unchanged from prior examination. Left percutaneous nephrostomy in appropriate position. No hydronephrosis. Resolved bilateral pleural effusions. Stable right subpleural pulmonary nodule, likely inflammatory in nature. Electronically Signed   By: Helyn NumbersAshesh  Parikh MD   On: 09/01/2020 01:58   CT CHEST ABDOMEN PELVIS W CONTRAST  Result Date: 08/08/2020 CLINICAL DATA:  60 year old female with history of complicated pyelonephritis. EXAM: CT CHEST, ABDOMEN, AND PELVIS WITH CONTRAST TECHNIQUE: Multidetector CT imaging of the chest, abdomen and pelvis was performed following the standard protocol during bolus administration of intravenous contrast. CONTRAST:  100mL OMNIPAQUE IOHEXOL 300 MG/ML  SOLN COMPARISON:  CT the chest, abdomen and pelvis 08/01/2020. FINDINGS: CT CHEST FINDINGS Cardiovascular: Heart size is normal. There is no significant pericardial fluid, thickening or pericardial calcification. There is aortic atherosclerosis, as well as atherosclerosis of the great vessels of the mediastinum and the coronary arteries, including calcified atherosclerotic plaque in the left anterior descending coronary artery. Dilatation of the pulmonic trunk (3.6 cm in diameter).  Mediastinum/Nodes: No pathologically enlarged in mediastinal or hilar lymph nodes. Esophagus is unremarkable in appearance. No axillary lymphadenopathy. Lungs/Pleura: Numerous pulmonary nodules and masses are again noted throughout the lungs bilaterally, many of which have central areas of cavitation. The largest of these is in the posterior aspect of the right upper lobe (axial image 54 of series 5) measuring 3.2 x 3.2 cm, with central low attenuation regions and internal cavitation, compatible with a large intrapulmonary abscess. Overall, findings are strongly suggestive of widespread septic emboli. Moderate bilateral pleural effusions lying dependently with extensive passive atelectasis  in the lower lobes of the lungs bilaterally. Musculoskeletal: There are no aggressive appearing lytic or blastic lesions noted in the visualized portions of the skeleton. CT ABDOMEN PELVIS FINDINGS Hepatobiliary: No suspicious cystic or solid hepatic lesions. No intra or extrahepatic biliary ductal dilatation. Gallbladder is normal in appearance. Pancreas: No pancreatic mass. No pancreatic ductal dilatation. No peripancreatic fluid collections or inflammatory changes. Spleen: Unremarkable. Adrenals/Urinary Tract: New nephrostomy tube extends into the left renal pelvis and appears appropriately located. The left kidneys enlarged and very heterogeneous in appearance with multiple hypovascular hypoenhancing regions, compatible with residual areas of pyelonephritis and associated renal and perinephric abscesses. These extend into the perinephric space where there is a combination of gas and fluid. Some of this tracks caudally where there is a larger collection of predominantly fluid, trace amount of gas, and some higher attenuation material adjacent to the tip of an indwelling drain (best appreciated on axial image 89 of series 2 and coronal image 74 of series 6) which measures approximately 4.3 x 2.1 x 3.6 cm. This perinephric fluid  collection trucks caudally into the left hemipelvis along the left pelvic sidewall, coming into contact with the left iliacus musculature. Overall, the extent of fluid in this collection appears increased. This is well demonstrated in a relatively well-defined portion of the collection laterally which currently measures 9.5 x 3.6 cm (axial image 78 of series 2) and previously measured only 8.2 x 2.4 cm when measured in a similar fashion on 08/01/2020. Right kidney and bilateral adrenal glands are normal in appearance. No right hydroureteronephrosis. Urinary bladder is completely decompressed around an indwelling Foley balloon catheter. Small amount of gas present in the lumen of the urinary bladder is presumably iatrogenic. Stomach/Bowel: The appearance of the stomach is normal. There is no pathologic dilatation of small bowel or colon. Normal appendix. Vascular/Lymphatic: Aortic atherosclerosis, without evidence of aneurysm or dissection in the abdominal or pelvic vasculature. Filling defect in the left renal vein best appreciated on coronal image 77 of series 6, compatible with nonocclusive left renal vein thrombus. No lymphadenopathy noted in the abdomen or pelvis. Reproductive: Uterus and ovaries are unremarkable in appearance. Other: Diffuse body wall edema. Trace volume of ascites. No pneumoperitoneum. Musculoskeletal: There are no aggressive appearing lytic or blastic lesions noted in the visualized portions of the skeleton. IMPRESSION: 1. Compared to the prior study there is been interval nephrostomy tube placement which appears appropriately located, as well as percutaneous drainage catheter placement in the inferior aspect of the patient's extensive perinephric abscess. Unfortunately, the overall size of the abscess appears increased compared to the prior examination, and there is new high attenuation fluid within the inferior aspect of this collection, which could represent some recent hemorrhage. If  there is concern for active hemorrhage, further evaluation with triple-phase CT of the abdomen and pelvis should be considered to evaluate for source of active bleeding. 2. Numerous large pulmonary nodules and masses in the lungs bilaterally, with imaging characteristics most compatible with extensive septic emboli with multifocal pulmonary abscess formation. 3. 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 This likely represents the source of emboli in this patient with evidence of septic embolization. 4. Moderate bilateral pleural effusions lying dependently with associated areas of passive atelectasis in the lower lobes of the lungs bilaterally. 5. Dilatation of the pulmonic trunk (3.6 cm in diameter), concerning for pulmonary arterial hypertension. 6. Additional incidental findings, as above. These results were called by telephone at the time  of interpretation on 08/08/2020 at 5:38 pm to provider [facility], who verbally acknowledged these results. Electronically Signed   By: Trudie Reed M.D.   On: 08/08/2020 17:38   DG Chest Port 1 View  Result Date: 08/04/2020 CLINICAL DATA:  60 year old female with respiratory failure, sepsis, emphysematous left renal pyelonephritis status post CT-guided nephrostomy, abscess drain. EXAM: PORTABLE CHEST 1 VIEW COMPARISON:  CT Chest, Abdomen, and Pelvis 08/01/2020 and earlier. FINDINGS: Portable AP semi upright view at 0541 hours. Lower lung volumes. Stable cardiac size and mediastinal contours. Increased left lung base opacity. Additional patchy and nodular bilateral pulmonary opacity better demonstrated by CT and not significantly changed. No pneumothorax. No pulmonary edema. No right pleural effusion. Visible bowel-gas within normal limits. No acute osseous abnormality identified. IMPRESSION: 1. Lower lung volumes with increased left lung base opacity which could be related to  combination of pleural effusion, atelectasis, or pneumonia. 2. Scattered patchy and nodular lung opacity elsewhere stable and suspicious for hematogenous disseminated infection (see CT 08/01/2020). Electronically Signed   By: Odessa Fleming M.D.   On: 08/04/2020 05:58   DG Abd 2 Views  Result Date: 08/08/2020 CLINICAL DATA:  Abdominal pain and decreased oral intake. EXAM: ABDOMEN - 2 VIEW COMPARISON:  Plain film of the abdomen dated 08/07/2020. FINDINGS: Mildly distended small bowel within the central abdomen with nonspecific air-fluid levels in the upper pelvis. Air is again seen within the colon. No evidence of renal or ureteral calculi. Two percutaneous pigtail catheters are again seen within the LEFT abdomen. Dense consolidation the LEFT lung base, presumed pneumonia. IMPRESSION: 1. Mildly distended small bowel within the central abdomen with nonspecific air-fluid levels in the upper pelvis. Findings could represent ileus or partial small bowel obstruction. 2. LEFT lower lung consolidation, pneumonia versus atelectasis. Electronically Signed   By: Bary Richard M.D.   On: 08/08/2020 10:04   DG Abd 2 Views  Result Date: 08/07/2020 CLINICAL DATA:  Sepsis.  Left pyelonephritis. EXAM: ABDOMEN - 2 VIEW COMPARISON:  None. FINDINGS: Mild gaseous distention of colon is seen predominately involving the nondependent sigmoid colon. Scattered air-fluid levels are seen, however there is no evidence of dilated small bowel loops. These findings are likely due to mild ileus. No radiopaque urinary calculi identified. Two percutaneous pigtail catheters are seen in the left upper and lower quadrants. IMPRESSION: Probable mild adynamic ileus. Two percutaneous pigtail catheters in left upper and lower quadrants. Electronically Signed   By: Danae Orleans M.D.   On: 08/07/2020 12:51   CT RENAL STONE STUDY  Result Date: 08/16/2020 CLINICAL DATA:  60 year old female with pyelonephritis and renal abscess. Follow-up CT. EXAM: CT  ABDOMEN AND PELVIS WITHOUT CONTRAST TECHNIQUE: Multidetector CT imaging of the abdomen and pelvis was performed following the standard protocol without IV contrast. COMPARISON:  CT abdomen pelvis dated 08/13/2020. FINDINGS: Evaluation of this exam is limited in the absence of intravenous contrast. Lower chest: Partially visualized small bilateral pleural effusions and consolidative changes of the lung bases as seen previously. Several pulmonary nodules measure 7 mm in the right middle lobe. No intra-abdominal free air or free fluid. Hepatobiliary: No focal liver abnormality is seen. No gallstones, gallbladder wall thickening, or biliary dilatation. Pancreas: Unremarkable. No pancreatic ductal dilatation or surrounding inflammatory changes. Spleen: Normal in size without focal abnormality. Adrenals/Urinary Tract: The adrenal glands unremarkable. Left percutaneous nephrostomy with pigtail tip in the left renal pelvis in similar position. There has been interval placement a percutaneous drainage catheter with pigtail tip in the posterior  perinephric collection. Slight interval decrease in the size of the perinephric collection compared to prior CT. There is no hydronephrosis or nephrolithiasis on either side. The right kidney is unremarkable. The urinary bladder is decompressed around a Foley catheter. Stomach/Bowel: There is no bowel obstruction or active inflammation. The appendix is not visualized with certainty. No inflammatory changes identified in the right lower quadrant. Vascular/Lymphatic: Mild aortoiliac atherosclerotic disease. The IVC is unremarkable. No portal venous gas. There is no adenopathy. Reproductive: The uterus and ovaries are grossly unremarkable. Other: Mild diffuse subcutaneous edema. There has been interval placement of a percutaneous drainage catheter with tip in the fluid collection along the anterior aspect of the left psoas muscle. The collection has decreased in size now measuring  approximately 4.2 x 8.0 cm (previously 4.4 x 8.7 cm). Musculoskeletal: No acute or significant osseous findings. IMPRESSION: 1. Interval placement of a percutaneous drainage catheter with tip in the posterior perinephric collection. Slight interval decrease in the size of the fluid collection compared to prior CT. 2. Decrease in the size of the collection anterior to the left psoas muscle status post percutaneous drainage catheter placement. Near 3. No hydronephrosis or nephrolithiasis. 4. No bowel obstruction. 5. Several pulmonary nodules measure 7 mm in the right middle lobe. 6. Aortic Atherosclerosis (ICD10-I70.0). Electronically Signed   By: Elgie Collard M.D.   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 EXAM: CT IMAGE GUIDED DRAINAGE BY PERCUTANEOUS CATHETER COMPARISON:  None. MEDICATIONS: The patient is currently admitted to the hospital and receiving intravenous antibiotics. The antibiotics were administered within an appropriate time frame prior to the initiation of the procedure. ANESTHESIA/SEDATION: Moderate (conscious) sedation was employed during this procedure. A total of Versed 2 mg and Fentanyl 100 mcg was administered intravenously. Moderate Sedation Time: 20 minutes. The patient's level of consciousness and vital signs were monitored continuously by radiology nursing throughout the procedure under my direct supervision. CONTRAST:  None COMPLICATIONS: None immediate. PROCEDURE: Informed written consent was obtained from the patient after a discussion of the risks, benefits and alternatives to treatment. The patient was placed in a partial right lateral decubitus position on the CT gantry and a pre procedural CT was performed  re-demonstrating the known abscess/fluid collection within the left posterior perinephric space. The procedure was planned. A timeout was performed prior to the initiation of the procedure. The left flank was prepped and draped in the usual sterile fashion. The overlying soft tissues were anesthetized with 1% lidocaine with epinephrine. Appropriate trajectory was planned with the use of a 22 gauge spinal needle. An 18 gauge trocar needle was advanced into the abscess/fluid collection and a short Amplatz super stiff wire was coiled within the collection. Appropriate positioning was confirmed with a limited CT scan. The tract was serially dilated allowing placement of a 12 [place] all-purpose drainage catheter. Appropriate positioning was confirmed with a limited postprocedural CT scan. Approximately 100 ml of purulent fluid was aspirated. The tube was connected to a bulb suction and sutured in place. A dressing was placed. The patient tolerated the procedure well without immediate post procedural complication. IMPRESSION: Successful CT guided placement of a 78 French all purpose drain catheter into the left posterior perinephric abscess with aspiration of 100 mL of purulent fluid. Samples were sent to the laboratory as requested by the ordering  clinical team. Marliss Cootsylan Suttle, MD [facility] Electronically Signed   By: Marliss Cootsylan  Suttle MD   On: 08/14/2020 15:01   US EKG SITE RITE  Result Date: 08/09/2020 If Site Rite image not attached, placement could not be confirmed due to current cardiac rhythm.    Dione BoozeGlick, Jennette Leask, MD 09/01/20 [phone]

## 2020-08-31 NOTE — ED Provider Notes (Signed)
[facility] EMERGENCY DEPARTMENT Provider Note   CSN: [id number] Arrival date & time: 08/31/20  1749     History Chief Complaint  Patient presents with  . Shortness of Breath    Tammie [name] is a 60 y.o. female with a history of obesity, type 2 diabetes, hospitalization from December until Aug 18, 2020, for severe sepsis, pyelonephritis with left renal abscess, acute kidney injury, nonketotic hyperglycemia, and hyponatremia.  She presents emergency department today with concern for chills and possible recurrent infection.  The patient has been getting home infusions of IV Ancef through a PICC line since her discharge from the hospital.  She reports that this afternoon she began having shaking chills after an infusion.  She feels mildly nauseated.  She spoke to the pharmacist on the phone who recommended she come for evaluation as she may have a recurrent infection.  During her past hospitalization, she did have a nephrostomy tube placed in the left side, as well as a left pelvic abscess drain placed by interventional radiology.  She had to be admitted to the ICU for 3% hypertonic saline infusion due to encephalopathy in the setting of hyponatremia.  Her blood cultures grew E. Coli.  She was narrowed to IV ancef and discharged home for continued infusions until 09/25/20.  She was also noted on CT chest to have numerous pulmonary nodules consistent with possible septic emboli.    HPI     Past Medical History:  Diagnosis Date  . Class 1 obesity   . Hypertension     Patient Active Problem List   Diagnosis Date Noted  . Pleural effusion   . Emphysematous pyelonephritis of left kidney   . Ileus ([place]) 08/08/2020  . Severe sepsis ([place]) 08/07/2020  . Perirenal abscess   . E coli bacteremia   . Acute pyelonephritis   . Bibasilar crackles   . Diabetic ketoacidosis without coma associated with type 2 diabetes mellitus ([place])   . Delirium   . Hyperosmolar hyperglycemic state (HHS) ([place])  08/01/2020  . Hyponatremia 08/01/2020  . Hypertension   . Acute renal failure ([place])   . Hypokalemia   . Severe protein-calorie malnutrition ([place])   . Class 1 obesity   . Leukocytosis   . Hypomagnesemia   . Hyperphosphatemia   . Abdominal distention   . Lung nodules   . Multifocal pneumonia 07/31/2020    Past Surgical History:  Procedure Laterality Date  . ABDOMINAL HYSTERECTOMY       OB History   No obstetric history on file.     Family History  Problem Relation Age of Onset  . Huntington's disease Father   . Diabetes Sister     Social History   Tobacco Use  . Smoking status: Never Smoker  . Smokeless tobacco: Never Used  Vaping Use  . Vaping Use: Never used  Substance Use Topics  . Alcohol use: Not Currently  . Drug use: Not Currently    Home Medications Prior to Admission medications   Medication Sig Start Date End Date Taking? Authorizing Provider  acetaminophen (TYLENOL) 500 MG tablet Take 1,000 mg by mouth every 6 (six) hours as needed.    [provider]  blood glucose meter kit and supplies KIT Dispense based on patient and insurance preference. Use up to four times daily as directed. (FOR ICD-9 250.00, 250.01). 08/18/20   Shelly Coss, MD  ceFAZolin (ANCEF) IVPB Inject 2 g into the vein every 8 (eight) hours. Indication:  Emphysematous pyelonephritis, intra-abdominal abscess  First Dose: Yes Last Day of Therapy:  09/25/2020 Labs - Once weekly:  CBC/D and BMP, Labs - Every other week:  ESR and CRP Method of administration: IV Push Method of administration may be changed at the discretion of home infusion pharmacist based upon assessment of the patient and/or caregiver's ability to self-administer the medication ordered. 08/16/20 09/26/20  Florencia Reasons, MD  insulin aspart (NOVOLOG FLEXPEN) 100 UNIT/ML FlexPen Inject 0-15 Units into the skin 3 (three) times daily with meals. 08/18/20   Shelly Coss, MD  insulin glargine (LANTUS SOLOSTAR) 100 UNIT/ML  Solostar Pen Inject 8 Units into the skin 2 (two) times daily. 08/18/20   Shelly Coss, MD  Insulin Pen Needle (PEN NEEDLES 3/16") 31G X 5 MM MISC 1 each by Does not apply route 3 (three) times daily. 08/18/20   Shelly Coss, MD  Pseudoeph-Doxylamine-DM-APAP (NYQUIL PO) Take 1 tablet by mouth daily as needed (congestion).    [provider]    Allergies    Codeine  Review of Systems   Review of Systems  Constitutional: Positive for chills and fever.  Eyes: Negative for photophobia and visual disturbance.  Respiratory: Negative for cough and shortness of breath.   Cardiovascular: Negative for chest pain and palpitations.  Gastrointestinal: Positive for nausea. Negative for abdominal pain and vomiting.  Genitourinary: Negative for dysuria and hematuria.  Musculoskeletal: Negative for arthralgias and myalgias.  Skin: Negative for color change and rash.  Neurological: Negative for syncope and headaches.  All other systems reviewed and are negative.   Physical Exam Updated Vital Signs BP 139/73 (BP Location: Left Arm)   Pulse (!) 127   Temp 99.3 F (37.4 C) (Oral)   Resp 20   Ht $R'5\' 1"'YS$  (1.549 m)   Wt 74.4 kg   LMP  (LMP Unknown)   SpO2 95%   BMI 30.99 kg/m   Physical Exam Constitutional:      General: She is not in acute distress. HENT:     Head: Normocephalic and atraumatic.  Eyes:     Conjunctiva/sclera: Conjunctivae normal.     Pupils: Pupils are equal, round, and reactive to light.  Cardiovascular:     Rate and Rhythm: Normal rate and regular rhythm.  Pulmonary:     Effort: Pulmonary effort is normal. No respiratory distress.  Abdominal:     General: There is no distension.     Tenderness: There is no abdominal tenderness.  Musculoskeletal:     Comments: Picc line insertion in right forearm non-erythematous Left nephrostomy tube draining yellow urine Left pelvic drain draining milky-brown fluid  Skin:    General: Skin is warm and dry.  Neurological:      General: No focal deficit present.     Mental Status: She is alert. Mental status is at baseline.  Psychiatric:        Mood and Affect: Mood normal.        Behavior: Behavior normal.     ED Results / Procedures / Treatments   Labs (all labs ordered are listed, but only abnormal results are displayed) Labs Reviewed  CBC WITH DIFFERENTIAL/PLATELET - Abnormal; Notable for the following components:      Result Value   WBC 21.5 (*)    RBC 2.85 (*)    Hemoglobin 8.5 (*)    HCT 26.7 (*)    Neutro Abs 19.7 (*)    Abs Immature Granulocytes 0.23 (*)    All other components within normal limits  LACTIC ACID, PLASMA  LACTIC  ACID, PLASMA  URINALYSIS, ROUTINE W REFLEX MICROSCOPIC  COMPREHENSIVE METABOLIC PANEL    EKG None  Radiology DG Chest 2 View  Result Date: 08/31/2020 CLINICAL DATA:  Shortness of breath. Possible PICC line infection. On antibiotics. EXAM: CHEST - 2 VIEW COMPARISON:  08/07/2020 and CT of 08/08/2020. FINDINGS: Left upper quadrant percutaneous drains. A right-sided PICC line terminates at the low SVC. Midline trachea. Mild cardiomegaly. Trace right pleural effusion. No pneumothorax. The dominant right upper lobe septic embolus measuring 2.4 cm today versus 3.1 cm on 08/07/2020 (when remeasured). The underlying pattern of diffuse reticulonodular opacification is difficult to directly compare to the prior AP radiograph, but may be slightly improved. No lobar consolidation. Mild left base subsegmental atelectasis. IMPRESSION: Reticulonodular opacification throughout both lungs, consistent with septic emboli when compared to 08/08/2020 CT. This may be slightly improved compared to a on a AP radiographs most recent 08/07/2020. The right upper lobe dominant lesion is slightly decreased in size. Trace right pleural effusion. Electronically Signed   By: Jeronimo Greaves M.D.   On: 08/31/2020 18:42    Procedures .Critical Care Performed by: Terald Sleeper, MD Authorized by:  Terald Sleeper, MD   Critical care provider statement:    Critical care time (minutes):  45   Critical care was necessary to treat or prevent imminent or life-threatening deterioration of the following conditions:  Sepsis   Critical care was time spent personally by me on the following activities:  Discussions with consultants, evaluation of patient's response to treatment, examination of patient, ordering and performing treatments and interventions, ordering and review of laboratory studies, ordering and review of radiographic studies, pulse oximetry, re-evaluation of patient's condition, obtaining history from patient or surrogate and review of old charts   (including critical care time)  Medications Ordered in ED Medications - No data to display  ED Course  I have reviewed the triage vital signs and the nursing notes.  Pertinent labs & imaging results that were available during my care of the patient were reviewed by me and considered in my medical decision making (see chart for details).  60 year old female present emergency department with concern for recurrent infection.  Complicated recent hospital course including E. coli sepsis, pelvic and perinephric abscess, septic emboli to lungs.  She has no chest pain and has no hypoxia on room air.  I have a lower suspicion for worsening clots in the lungs.  Her initial labs presentation may show a sudden elevation in her leukocytosis with a white count of 22,000, was normal 10 days ago.  She is also tachycardic with an initial oral temp of 99.3.  I initiated a sepsis work-up.  Her lactate was 1.1.  She does not appear to be in septic shock.  I suspect the source is most likely pelvic or urinary.  However she may also have a line infection.  I therefore initiated her on IV vancomycin as well as IV Zosyn as empiric broad-spectrum coverage.  The remainder of her labs are pending.  I ordered her 30 cc/kg LR fluid bolus.  Chest x-ray reviewed  showing a small right-sided pleural effusion and continued pulmonary nodules.  She will need readmission to the hospital.  She may also need repeat CT imaging of the abdomen to evaluate for new or worsening infection.   Clinical Course as of 09/01/20 0040  Wynelle Link Aug 31, 2020  2328 Pt signed out to Dr Preston Fleeting EDP pending CT abdomen pelvis, f/u labs, admit for sepsis. [MT]  Clinical Course User Index [MT] Neiko Trivedi, Carola Rhine, MD    Final Clinical Impression(s) / ED Diagnoses Final diagnoses:  None    Rx / DC Orders ED Discharge Orders    None       Gorje Iyer, Carola Rhine, MD 09/01/20 [phone]

## 2020-08-31 NOTE — ED Notes (Signed)
Pt difficult IV stick. Pt has right arm restricted extremity due to PICC Line in place. PICC line flushes but will not pull blood back for blood draws. MD Notified. 1 set of cultures obtained from peripheral IV in left forearm.

## 2020-08-31 NOTE — ED Triage Notes (Signed)
Pt to er, pt states that she is currently getting cefazolin through her picc line, states that she has three drains in her L back, states that after getting her abx today she had some chills and had some sob, states that she called her rn and was told to take some benadryl for a possible allergic reaction.  States that she has been on this abx since the 11th, states that she called her md and was told to come in and get r/o or possible infection at picc site or drains.

## 2020-09-01 ENCOUNTER — Emergency Department (HOSPITAL_COMMUNITY): Payer: BC Managed Care – PPO

## 2020-09-01 ENCOUNTER — Encounter (HOSPITAL_COMMUNITY): Payer: Self-pay | Admitting: Internal Medicine

## 2020-09-01 DIAGNOSIS — A419 Sepsis, unspecified organism: Secondary | ICD-10-CM | POA: Diagnosis not present

## 2020-09-01 DIAGNOSIS — T83022A Displacement of nephrostomy catheter, initial encounter: Secondary | ICD-10-CM | POA: Diagnosis present

## 2020-09-01 DIAGNOSIS — E876 Hypokalemia: Secondary | ICD-10-CM

## 2020-09-01 DIAGNOSIS — N390 Urinary tract infection, site not specified: Secondary | ICD-10-CM | POA: Diagnosis not present

## 2020-09-01 DIAGNOSIS — E8809 Other disorders of plasma-protein metabolism, not elsewhere classified: Secondary | ICD-10-CM

## 2020-09-01 DIAGNOSIS — E1165 Type 2 diabetes mellitus with hyperglycemia: Secondary | ICD-10-CM

## 2020-09-01 DIAGNOSIS — R778 Other specified abnormalities of plasma proteins: Secondary | ICD-10-CM

## 2020-09-01 LAB — COMPREHENSIVE METABOLIC PANEL
ALT: 9 U/L (ref 0–44)
ALT: 8 U/L (ref 0–44)
AST: 15 U/L (ref 15–41)
AST: 15 U/L (ref 15–41)
Albumin: 2.7 g/dL — ABNORMAL LOW (ref 3.5–5.0)
Albumin: 2.5 g/dL — ABNORMAL LOW (ref 3.5–5.0)
Alkaline Phosphatase: 207 U/L — ABNORMAL HIGH (ref 38–126)
Alkaline Phosphatase: 182 U/L — ABNORMAL HIGH (ref 38–126)
Anion gap: 12 (ref 5–15)
Anion gap: 9 (ref 5–15)
BUN: 10 mg/dL (ref 6–20)
BUN: 8 mg/dL (ref 6–20)
CO2: 25 mmol/L (ref 22–32)
CO2: 25 mmol/L (ref 22–32)
Calcium: 8.7 mg/dL — ABNORMAL LOW (ref 8.9–10.3)
Calcium: 8.6 mg/dL — ABNORMAL LOW (ref 8.9–10.3)
Chloride: 100 mmol/L (ref 98–111)
Chloride: 105 mmol/L (ref 98–111)
Creatinine, Ser: 0.79 mg/dL (ref 0.44–1.00)
Creatinine, Ser: 0.63 mg/dL (ref 0.44–1.00)
GFR, Estimated: >60 mL/min (ref >60)
GFR, Estimated: >60 mL/min (ref >60)
Glucose, Bld: 165 mg/dL — ABNORMAL HIGH (ref 70–99)
Glucose, Bld: 155 mg/dL — ABNORMAL HIGH (ref 70–99)
Potassium: 3 mmol/L — ABNORMAL LOW (ref 3.5–5.1)
Potassium: 3.5 mmol/L (ref 3.5–5.1)
Sodium: 137 mmol/L (ref 135–145)
Sodium: 139 mmol/L (ref 135–145)
Total Bilirubin: 0.6 mg/dL (ref 0.3–1.2)
Total Bilirubin: 0.7 mg/dL (ref 0.3–1.2)
Total Protein: 6.5 g/dL (ref 6.5–8.1)
Total Protein: 6 g/dL — ABNORMAL LOW (ref 6.5–8.1)

## 2020-09-01 LAB — CBC WITH DIFFERENTIAL/PLATELET
Abs Immature Granulocytes: 0.05 10*3/uL (ref 0.00–0.07)
Basophils Absolute: 0 10*3/uL (ref 0.0–0.1)
Basophils Relative: 0 %
Eosinophils Absolute: 0.2 10*3/uL (ref 0.0–0.5)
Eosinophils Relative: 2 %
HCT: 24.4 % — ABNORMAL LOW (ref 36.0–46.0)
Hemoglobin: 7.4 g/dL — ABNORMAL LOW (ref 12.0–15.0)
Immature Granulocytes: 1 %
Lymphocytes Relative: 14 %
Lymphs Abs: 1.2 10*3/uL (ref 0.7–4.0)
MCH: 28.9 pg (ref 26.0–34.0)
MCHC: 30.3 g/dL (ref 30.0–36.0)
MCV: 95.3 fL (ref 80.0–100.0)
Monocytes Absolute: 0.8 10*3/uL (ref 0.1–1.0)
Monocytes Relative: 8 %
Neutro Abs: 6.8 10*3/uL (ref 1.7–7.7)
Neutrophils Relative %: 75 %
Platelets: 352 10*3/uL (ref 150–400)
RBC: 2.56 MIL/uL — ABNORMAL LOW (ref 3.87–5.11)
RDW: 14.5 % (ref 11.5–15.5)
WBC: 9.1 10*3/uL (ref 4.0–10.5)
nRBC: 0 % (ref 0.0–0.2)

## 2020-09-01 LAB — BASIC METABOLIC PANEL
Anion gap: 10 (ref 5–15)
BUN: 11 mg/dL (ref 6–20)
CO2: 26 mmol/L (ref 22–32)
Calcium: 8.4 mg/dL — ABNORMAL LOW (ref 8.9–10.3)
Chloride: 99 mmol/L (ref 98–111)
Creatinine, Ser: 0.79 mg/dL (ref 0.44–1.00)
GFR, Estimated: >60 mL/min (ref >60)
Glucose, Bld: 187 mg/dL — ABNORMAL HIGH (ref 70–99)
Potassium: 2.9 mmol/L — ABNORMAL LOW (ref 3.5–5.1)
Sodium: 135 mmol/L (ref 135–145)

## 2020-09-01 LAB — URINALYSIS, ROUTINE W REFLEX MICROSCOPIC
Bilirubin Urine: NEGATIVE
Glucose, UA: 50 mg/dL — AB
Ketones, ur: NEGATIVE mg/dL
Nitrite: NEGATIVE
Protein, ur: 100 mg/dL — AB
Specific Gravity, Urine: 1.006 (ref 1.005–1.030)
pH: 6 (ref 5.0–8.0)

## 2020-09-01 LAB — HEPATIC FUNCTION PANEL
ALT: 9 U/L (ref 0–44)
AST: 15 U/L (ref 15–41)
Albumin: 2.5 g/dL — ABNORMAL LOW (ref 3.5–5.0)
Alkaline Phosphatase: 205 U/L — ABNORMAL HIGH (ref 38–126)
Bilirubin, Direct: 0.1 mg/dL (ref 0.0–0.2)
Indirect Bilirubin: 0.5 mg/dL (ref 0.3–0.9)
Total Bilirubin: 0.6 mg/dL (ref 0.3–1.2)
Total Protein: 6.4 g/dL — ABNORMAL LOW (ref 6.5–8.1)

## 2020-09-01 LAB — TROPONIN I (HIGH SENSITIVITY)
Troponin I (High Sensitivity): 26 ng/L — ABNORMAL HIGH (ref <18)
Troponin I (High Sensitivity): 23 ng/L — ABNORMAL HIGH (ref <18)

## 2020-09-01 LAB — CBG MONITORING, ED
Glucose-Capillary: 149 mg/dL — ABNORMAL HIGH (ref 70–99)
Glucose-Capillary: 118 mg/dL — ABNORMAL HIGH (ref 70–99)
Glucose-Capillary: 138 mg/dL — ABNORMAL HIGH (ref 70–99)

## 2020-09-01 LAB — GLUCOSE, CAPILLARY: Glucose-Capillary: 141 mg/dL — ABNORMAL HIGH (ref 70–99)

## 2020-09-01 LAB — MAGNESIUM: Magnesium: 1.5 mg/dL — ABNORMAL LOW (ref 1.7–2.4)

## 2020-09-01 LAB — SARS CORONAVIRUS 2 BY RT PCR (HOSPITAL ORDER, PERFORMED IN ~~LOC~~ HOSPITAL LAB): SARS Coronavirus 2: NEGATIVE

## 2020-09-01 MED ORDER — ONDANSETRON HCL 4 MG/2ML IJ SOLN: 4.0000 mg | Freq: Four times a day (QID) | INTRAMUSCULAR | Status: DC | PRN

## 2020-09-01 MED ORDER — IOHEXOL 300 MG/ML  SOLN
100.0000 mL | Freq: Once | INTRAMUSCULAR | Status: AC | PRN
  Administered 2020-09-01: 100 mL via INTRAVENOUS

## 2020-09-01 MED ORDER — STERILE WATER FOR INJECTION IJ SOLN
INTRAMUSCULAR | Status: AC
  Administered 2020-09-01: 2.2 mL
  Filled 2020-09-01: qty 10

## 2020-09-01 MED ORDER — ALTEPLASE 2 MG IJ SOLR
2.0000 mg | Freq: Once | INTRAMUSCULAR | Status: AC
  Administered 2020-09-01: 2 mg
  Filled 2020-09-01: qty 2

## 2020-09-01 MED ORDER — ENOXAPARIN SODIUM 40 MG/0.4ML ~~LOC~~ SOLN
40.0000 mg | SUBCUTANEOUS | Status: DC
  Administered 2020-09-01 – 2020-09-02 (×2): 40 mg via SUBCUTANEOUS
  Filled 2020-09-01 (×2): qty 0.4

## 2020-09-01 MED ORDER — ACETAMINOPHEN 325 MG PO TABS
650.0000 mg | ORAL_TABLET | Freq: Four times a day (QID) | ORAL | Status: DC | PRN
  Administered 2020-09-03 – 2020-09-05 (×4): 650 mg via ORAL
  Filled 2020-09-01 (×4): qty 2

## 2020-09-01 MED ORDER — ONDANSETRON HCL 4 MG PO TABS: 4.0000 mg | ORAL_TABLET | Freq: Four times a day (QID) | ORAL | Status: DC | PRN

## 2020-09-01 MED ORDER — ALTEPLASE 2 MG IJ SOLR
2.0000 mg | Freq: Once | INTRAMUSCULAR | Status: AC | PRN
  Administered 2020-09-01 (×2): 2 mg
  Filled 2020-09-01 (×3): qty 2

## 2020-09-01 MED ORDER — INSULIN ASPART 100 UNIT/ML ~~LOC~~ SOLN
0.0000 [IU] | Freq: Three times a day (TID) | SUBCUTANEOUS | Status: DC
  Administered 2020-09-01 – 2020-09-02 (×3): 2 [IU] via SUBCUTANEOUS
  Administered 2020-09-02: 3 [IU] via SUBCUTANEOUS
  Administered 2020-09-02 – 2020-09-03 (×2): 2 [IU] via SUBCUTANEOUS
  Filled 2020-09-01 (×2): qty 1

## 2020-09-01 MED ORDER — ACETAMINOPHEN 650 MG RE SUPP: 650.0000 mg | Freq: Four times a day (QID) | RECTAL | Status: DC | PRN

## 2020-09-01 MED ORDER — POTASSIUM CHLORIDE CRYS ER 20 MEQ PO TBCR
40.0000 meq | EXTENDED_RELEASE_TABLET | Freq: Once | ORAL | Status: AC
  Administered 2020-09-01: 40 meq via ORAL
  Filled 2020-09-01: qty 2

## 2020-09-01 MED ORDER — SODIUM CHLORIDE 0.9 % IV SOLN
2.0000 g | Freq: Three times a day (TID) | INTRAVENOUS | Status: DC
  Administered 2020-09-01 – 2020-09-05 (×13): 2 g via INTRAVENOUS
  Filled 2020-09-01 (×13): qty 2

## 2020-09-01 MED ORDER — POTASSIUM CHLORIDE IN NACL 40-0.9 MEQ/L-% IV SOLN
INTRAVENOUS | Status: AC
  Filled 2020-09-01 (×2): qty 1000

## 2020-09-01 MED ORDER — STERILE WATER FOR INJECTION IJ SOLN
INTRAMUSCULAR | Status: AC
  Filled 2020-09-01: qty 10

## 2020-09-01 MED ORDER — INSULIN GLARGINE 100 UNIT/ML ~~LOC~~ SOLN
8.0000 [IU] | Freq: Two times a day (BID) | SUBCUTANEOUS | Status: DC
  Administered 2020-09-01 – 2020-09-05 (×8): 8 [IU] via SUBCUTANEOUS
  Filled 2020-09-01 (×15): qty 0.08

## 2020-09-01 NOTE — H&P (Signed)
History and Physical    Toba Claudio YHC:[id number] DOB: 1961/02/12 DOA: 08/31/2020  PCP: Patient, No Pcp Per   Patient coming from: Home.  I have personally briefly reviewed patient's old medical records in [place]  Chief Complaint: Chills and shortness of breath.  HPI: Tammie [name] is a 60 y.o. female with medical history significant of type 2 diabetes, hypertension, class I obesity, lung nodules, pleural effusion, multifocal pneumonia, recently admitted for acute emphysematous pyelonephritis with perirenal abscess secondary to E. coli, nonketotic hyperglycemia, severe hyponatremia who underwent nephrostomy tube placement and left pelvic abscess drained placement by interventional radiology.  She was subsequently discharged on cefazolin infusion until 09/25/2020 and her post hospitalization period at home have been uneventful until yesterday afternoon when she developed chills, nausea and emesis after the cefazolin infusion.  She has never had a reaction before.  She spoke to the home health nurse, who asked her to come to the emergency department.  She denies fever or night sweats.  Her appetite has not been decreased.  She denies rhinorrhea, sore throat, dyspnea, wheezing or hemoptysis.  No chest pain, palpitations, diaphoresis, PND, orthopnea or recent pitting edema of the lower extremities.  She denies abdominal pain, diarrhea, constipation, melena or hematochezia.  No dysuria or frequency.  No hematuria.  No polyuria, polydipsia, polyphagia or blurred vision.  ED Course: Initial vital signs were temperature 99.3 F, pulse 127, respiration 20, BP 139/73 mmHg O2 sat 95% on room air.  The patient received 2200+ mL of LR bolus, KCl 40 mEq p.o. x1 dose, Zosyn and vancomycin in the emergency department.  Lab work: Her urinalysis was corvette with glucosuria 50 and proteinuria more than 100 mg/dL.  There was moderate hemoglobinuria and leukocyte esterase.  There were 11-20 non squamous  epithelial cells, RBC 20-50 and WBC 11-20 with a few bacteria and positive WBC clumps on microscopic examination.  Coronavirus PCR was negative.  CBC showed a white count of 21.5 with 92% neutrophils, 3% lymphocytes and 3% monocytes.  Hemoglobin 8.5 g/dL and platelets 385.  Lactic acid was normal.  Chemistry showed a potassium of 2.9 mmol/L.  The rest of the electrolytes are within normal range when calcium is corrected to albumin.  Renal function was normal.  Glucose 187 and calcium 8.4 mg/dL.  Total protein 6.4 and albumin 2.5 g/dL.  Alkaline phosphatase 205 units/L.  Transaminases and total bilirubin were normal.  Troponin was 26 and then 23 ng/L.  Imaging: 2 view chest radiograph showed reticulonodular opacification throughout both lungs, consistent with septic emboli.  When compared to 08/08/2020, this may be a slightly improved when compared to an AP radiograph from 08/07/2020 and 08/08/2020.  CT abdomen/pelvis with contrast shows a left perinephric abscess which has slightly decreased in size when compared to prior examination.  There are percutaneous drainage catheter is seen within the collection, though has been partially withdrawn when compared to prior exam.  Left posterior pararenal U-shaped abscess appear unchanged when compared to prior examination.  Left percutaneous nephrostomy in appropriate position.  No hydronephrosis.  Resolved bilateral pleural effusions.  Stable right subpleural pulmonary no, likely inflammatory in nature.  Review of Systems: As per HPI otherwise all other systems reviewed and are negative.  Past Medical History:  Diagnosis Date  . Acute pyelonephritis   . Class 1 obesity   . Hypertension   . Lung nodules   . Multifocal pneumonia 07/31/2020  . Perirenal abscess   . Pleural effusion    Past Surgical History:  Procedure Laterality Date  . ABDOMINAL HYSTERECTOMY     Social History  reports that she has never smoked. She has never used smokeless tobacco. She  reports previous alcohol use. She reports previous drug use.  Allergies  Allergen Reactions  . Codeine Rash    Family History  Problem Relation Age of Onset  . Huntington's disease Father   . Diabetes Sister    Prior to Admission medications   Medication Sig Start Date End Date Taking? Authorizing Provider  acetaminophen (TYLENOL) 500 MG tablet Take 1,000 mg by mouth every 6 (six) hours as needed.    [provider]  blood glucose meter kit and supplies KIT Dispense based on patient and insurance preference. Use up to four times daily as directed. (FOR ICD-9 250.00, 250.01). 08/18/20   Shelly Coss, MD  ceFAZolin (ANCEF) IVPB Inject 2 g into the vein every 8 (eight) hours. Indication:  Emphysematous pyelonephritis, intra-abdominal abscess First Dose: Yes Last Day of Therapy:  09/25/2020 Labs - Once weekly:  CBC/D and BMP, Labs - Every other week:  ESR and CRP Method of administration: IV Push Method of administration may be changed at the discretion of home infusion pharmacist based upon assessment of the patient and/or caregiver's ability to self-administer the medication ordered. 08/16/20 09/26/20  Florencia Reasons, MD  insulin aspart (NOVOLOG FLEXPEN) 100 UNIT/ML FlexPen Inject 0-15 Units into the skin 3 (three) times daily with meals. 08/18/20   Shelly Coss, MD  insulin glargine (LANTUS SOLOSTAR) 100 UNIT/ML Solostar Pen Inject 8 Units into the skin 2 (two) times daily. 08/18/20   Shelly Coss, MD  Insulin Pen Needle (PEN NEEDLES 3/16") 31G X 5 MM MISC 1 each by Does not apply route 3 (three) times daily. 08/18/20   Shelly Coss, MD  Pseudoeph-Doxylamine-DM-APAP (NYQUIL PO) Take 1 tablet by mouth daily as needed (congestion).    [provider]    Physical Exam: Vitals:   09/01/20 0145 09/01/20 0200 09/01/20 0215 09/01/20 0230  BP:    (!) 117/53  Pulse:   93 91  Resp: 19 (!) 23 (!) 23 20  Temp:      TempSrc:      SpO2:   96% 94%  Weight:      Height:         Constitutional: NAD, calm, comfortable. Eyes: PERRL, lids and conjunctivae normal ENMT: Mucous membranes are mildly dry. Posterior pharynx clear of any exudate or lesions. Neck: normal, supple, no masses, no thyromegaly Respiratory: clear to auscultation bilaterally, no wheezing, no crackles. Normal respiratory effort. No accessory muscle use.  Cardiovascular: Regular rate and rhythm with occasional extrasystole, no murmurs / rubs / gallops. No extremity edema. 2+ pedal pulses. No carotid bruits.  PICC line in place. Abdomen: Obese, no distention.  Bowel sounds positive.  Soft, no tenderness, no masses palpated. No hepatosplenomegaly. Bowel sounds positive. GU: Left nephrostomy tubes.  Left retroperitoneal abscess percutaneous tube is draining purulent fluid.  There is mild left flank CVA. Musculoskeletal: no clubbing / cyanosis.  Good ROM, no contractures. Normal muscle tone.  Skin: no rashes, lesions, ulcers on very limited otological examination. Neurologic: CN 2-12 grossly intact. Sensation intact, DTR normal. Strength 5/5 in all 4.  Psychiatric: Normal judgment and insight. Alert and oriented x 3. Normal mood.   Labs on Admission: I have personally reviewed following labs and imaging studies  CBC: Recent Labs  Lab 08/31/20 1916  WBC 21.5*  NEUTROABS 19.7*  HGB 8.5*  HCT 26.7*  MCV 93.7  PLT 696    Basic Metabolic Panel: Recent Labs  Lab 08/31/20 2315 09/01/20 0110  NA 135 137  K 2.9* 3.0*  CL 99 100  CO2 26 25  GLUCOSE 187* 165*  BUN 11 10  CREATININE 0.79 0.79  CALCIUM 8.4* 8.7*    GFR: Estimated Creatinine Clearance: 69.8 mL/min (by C-G formula based on SCr of 0.79 mg/dL).  Liver Function Tests: Recent Labs  Lab 08/31/20 2315 09/01/20 0110  AST 15 15  ALT 9 9  ALKPHOS 205* 207*  BILITOT 0.6 0.6  PROT 6.4* 6.5  ALBUMIN 2.5* 2.7*    Urine analysis:    Component Value Date/Time   COLORURINE AMBER (A) 09/01/2020 0005   APPEARANCEUR TURBID (A)  09/01/2020 0005   LABSPEC 1.006 09/01/2020 0005   PHURINE 6.0 09/01/2020 0005   GLUCOSEU 50 (A) 09/01/2020 0005   HGBUR MODERATE (A) 09/01/2020 0005   BILIRUBINUR NEGATIVE 09/01/2020 0005   KETONESUR NEGATIVE 09/01/2020 0005   PROTEINUR 100 (A) 09/01/2020 0005   NITRITE NEGATIVE 09/01/2020 0005   LEUKOCYTESUR MODERATE (A) 09/01/2020 0005    Radiological Exams on Admission: DG Chest 2 View  Result Date: 08/31/2020 CLINICAL DATA:  Shortness of breath. Possible PICC line infection. On antibiotics. EXAM: CHEST - 2 VIEW COMPARISON:  08/07/2020 and CT of 08/08/2020. FINDINGS: Left upper quadrant percutaneous drains. A right-sided PICC line terminates at the low SVC. Midline trachea. Mild cardiomegaly. Trace right pleural effusion. No pneumothorax. The dominant right upper lobe septic embolus measuring 2.4 cm today versus 3.1 cm on 08/07/2020 (when remeasured). The underlying pattern of diffuse reticulonodular opacification is difficult to directly compare to the prior AP radiograph, but may be slightly improved. No lobar consolidation. Mild left base subsegmental atelectasis. IMPRESSION: Reticulonodular opacification throughout both lungs, consistent with septic emboli when compared to 08/08/2020 CT. This may be slightly improved compared to a on a AP radiographs most recent 08/07/2020. The right upper lobe dominant lesion is slightly decreased in size. Trace right pleural effusion. Electronically Signed   By: Abigail Miyamoto M.D.   On: 08/31/2020 18:42   CT ABDOMEN PELVIS W CONTRAST  Result Date: 09/01/2020 CLINICAL DATA:  Abdominal pain, fever EXAM: CT ABDOMEN AND PELVIS WITH CONTRAST TECHNIQUE: Multidetector CT imaging of the abdomen and pelvis was performed using the standard protocol following bolus administration of intravenous contrast. CONTRAST:  123mL OMNIPAQUE IOHEXOL 300 MG/ML  SOLN COMPARISON:  08/16/2020, 08/13/2020 FINDINGS: Lower chest: Subpleural nodule within the visualized right lung base  and atelectasis within the left lung base are unchanged. Previously noted pleural effusions have resolved. The visualized heart and pericardium are unremarkable. Hepatobiliary: No focal liver abnormality is seen. No gallstones, gallbladder wall thickening, or biliary dilatation. Pancreas: Unremarkable Spleen: Unremarkable Adrenals/Urinary Tract: The adrenal glands are unremarkable. The kidneys are normal in size and position. The right kidney is unremarkable. Percutaneous nephrostomy is seen within the left kidney, unchanged with its retaining loop formed within the left renal pelvis. Percutaneous pigtail drainage catheter is seen within the a posterior perinephric fluid collection, but has been partially withdrawn with its retaining loop now just within the loculated collection. The collection is slightly improved in overall size, now measuring 3.9 x 8.6 by 13.2 cm in greatest dimension on axial image # 29 and sagittal reformat # 105. Punctate foci of gas are again seen within the collection, though this appears reduced when compared to prior examination. A second collection is seen within the posterior left pararenal space. This U shaped collection measures roughly  11.2 x 9.7 cm at its base in the region of the percutaneous drainage catheter, axial image # 52. Superolaterally, this measures 4.2 x 11.1 cm at axial image # 47. These appear unchanged from prior examination, though appear improved when compared to remote prior examination of 08/08/2020. Pigtail drainage catheter within the basilar component appears unchanged in position. Stomach/Bowel: Stomach, small bowel, and large bowel are unremarkable. Appendix normal. No free intraperitoneal gas or fluid. Vascular/Lymphatic: The abdominal vasculature is unremarkable. Shotty left periaortic lymphadenopathy likely represents reactive adenopathy. No frankly pathologic adenopathy within the abdomen and pelvis. Reproductive: Uterus and bilateral adnexa are  unremarkable. Other: No abdominal wall hernia.  Rectum unremarkable. Musculoskeletal: No acute bone abnormality. No lytic or blastic bone lesions are seen. IMPRESSION: Left perinephric abscess has slightly decreased in size when compared to prior examination. Percutaneous drainage catheter is seen within the collection, though has been partially withdrawn when compared to prior exam. Left posterior pararenal U shaped abscess appears unchanged when compared to prior examination. Percutaneous drainage catheter appears appropriately position within the dependent component of the collection, unchanged from prior examination. Left percutaneous nephrostomy in appropriate position. No hydronephrosis. Resolved bilateral pleural effusions. Stable right subpleural pulmonary nodule, likely inflammatory in nature. Electronically Signed   By: Fidela Salisbury MD   On: 09/01/2020 01:58    EKG: Independently reviewed.   Assessment/Plan Principal problem:   Sepsis secondary to UTI ([place])   Perirenal abscess Observation/telemetry. Supplemental oxygen as needed. Continue time-limited, IV hydration. PICC line evaluation by IV team. Continue vancomycin per pharmacy. Begin cefepime 2 g IVPB every 8 hours. Follow-up blood culture and sensitivity. Follow-up urine culture and sensitivity. Consider urology and/or IR evaluation if no improvement.  Active Problems:   Hypertension Monitor blood pressure. As needed antihypertensive therapy.    Hypokalemia Replenishing. Follow-up potassium level.    Hypomagnesemia Replacement ordered. Follow-up magnesium level as needed.    Hypoalbuminemia In the setting of type II diabetes and chronic infection. Consider nutritional services evaluation.    Type 2 diabetes mellitus with hyperglycemia (HCC) Carbohydrate modified diet. Continue Lantus 8 units SQ twice daily. CBG monitoring with R ISS.    DVT prophylaxis: Lovenox SQ. Code Status:   Full code. Family  Communication: Disposition Plan:   Patient is from:  Home.  Anticipated DC to:  Home.  Anticipated DC date:  09/03/2020.  Anticipated DC barriers: Clinical status.  Consults called: Admission status:  Observation/telemetry.  Severity of Illness:  High due to recurring sepsis secondary to UTI with hypoalbuminemia and multiple electrolyte abnormalities.  The patient will need to remain in the hospital for electrolyte replacement and IV antibiotic therapy for 2 to 3 days.  Reubin Milan MD [facility]  How to contact the [facility] Attending or Consulting provider [place] or covering provider during after hours [place], for this patient?   1. Check the care team in [facility] and look for a) attending/consulting TRH provider listed and b) the [facility] team listed 2. Log into [email or web address] and use [place]'s universal password to access. If you do not have the password, please contact the hospital operator. 3. Locate the [facility] provider you are looking for under [facility] and page to a number that you can be directly reached. 4. If you still have difficulty reaching the provider, please page the [facility] (Director on Call) for the Hospitalists listed on amion for assistance.  09/01/2020, 2:56 AM   This document was prepared using Dragon voice recognition software and may  contain some unintended transcription errors.

## 2020-09-01 NOTE — TOC Initial Note (Signed)
Transition of Care [facility]) - Initial/Assessment Note    Patient Details  Name: Tammie [name] MRN: [id number] Date of Birth: 1961-04-28  Transition of Care [facility]) CM/SW Contact:    Leitha Bleak, RN Phone Number: 09/01/2020, 1:20 PM Clinical Narrative:  Patient admitted in OBS for sepsis secondary to UTI. Patient is from home. Has No PCP. List provided. Patient does not have Insurance listed, Per husband she is now on his BC/BS plan. He will get a card with her name on it Wednesday and take to registration . Patient has a PICC line. Plan to discharge home int 2-3 days and continue IV antibiotics with [facility]. [name] updated.               Expected Discharge Plan: Home w Home Health Services Barriers to Discharge: Continued Medical Work up   Patient Goals and CMS Choice Patient states their goals for this hospitalization and ongoing recovery are:: to return back home with IV antibiotics. CMS Medicare.gov Compare Post Acute Care list provided to:: Patient Represenative (must comment) Choice offered to / list presented to : Spouse  Expected Discharge Plan and Services Expected Discharge Plan: Home w Home Health Services      Living arrangements for the past 2 months: Single Family Home                    Prior Living Arrangements/Services Living arrangements for the past 2 months: Single Family Home Lives with:: Spouse          Need for Family Participation in Patient Care: Yes (Comment) Care giver support system in place?: Yes (comment)   Criminal Activity/Legal Involvement Pertinent to Current Situation/Hospitalization: No - Comment as needed  Emotional Assessment     Affect (typically observed): Accepting Orientation: : Oriented to Self,Oriented to Place,Oriented to  Time,Oriented to Situation Alcohol / Substance Use: Not Applicable Psych Involvement: No (comment)  Admission diagnosis:  Sepsis secondary to UTI (HCC) [A41.9, N39.0] Patient Active Problem List    Diagnosis Date Noted  . Sepsis secondary to UTI (HCC) 09/01/2020  . Type 2 diabetes mellitus with hyperglycemia (HCC) 09/01/2020  . Hypoalbuminemia 09/01/2020  . Pleural effusion   . Emphysematous pyelonephritis of left kidney   . Ileus (HCC) 08/08/2020  . Severe sepsis (HCC) 08/07/2020  . Perirenal abscess   . E coli bacteremia   . Acute pyelonephritis   . Bibasilar crackles   . Diabetic ketoacidosis without coma associated with type 2 diabetes mellitus (HCC)   . Delirium   . Hyperosmolar hyperglycemic state (HHS) (HCC) 08/01/2020  . Hyponatremia 08/01/2020  . Hypertension   . Acute renal failure (HCC)   . Hypokalemia   . Severe protein-calorie malnutrition (HCC)   . Class 1 obesity   . Leukocytosis   . Hypomagnesemia   . Hyperphosphatemia   . Abdominal distention   . Lung nodules   . Multifocal pneumonia 07/31/2020   PCP:  Patient, No Pcp Per Pharmacy:   [facility] [address], [place] - [address] [place] [address] Phone: [phone] Fax: [phone]

## 2020-09-01 NOTE — Progress Notes (Signed)
Secure chat with Vernard Gambles RN and Tiffany RN AC.  Tpa ordered for PICC line dwell  Per protocol.

## 2020-09-01 NOTE — Progress Notes (Signed)
Pharmacy Antibiotic Note  Tammie [name] is a 60 y.o. female admitted on 08/31/2020 with sepsis.  Pharmacy has been consulted to change Zosyn to cefepime.  Plan: Cefepime 2g IV Q8H.  Height: 5\' 1"  (154.9 cm) Weight: 74.4 kg (164 lb) IBW/kg (Calculated) : 47.8  Temp (24hrs), Avg:99.3 F (37.4 C), Min:99.3 F (37.4 C), Max:99.3 F (37.4 C)  Recent Labs  Lab 08/31/20 1916 08/31/20 2315 09/01/20 0110  WBC 21.5*  --   --   CREATININE  --  0.79 0.79  LATICACIDVEN 1.1  --   --     Estimated Creatinine Clearance: 69.8 mL/min (by C-G formula based on SCr of 0.79 mg/dL).    Allergies  Allergen Reactions  . Codeine Rash     Thank you for allowing pharmacy to be a part of this patient's care.  09/03/20, PharmD, BCPS  09/01/2020 3:04 AM

## 2020-09-01 NOTE — Progress Notes (Signed)
[facility] following for sepsis protocol. 

## 2020-09-01 NOTE — ED Notes (Signed)
Patient transported to CT 

## 2020-09-01 NOTE — Progress Notes (Signed)
IV team consulted for PICC line declotting. PICC line hard to flush and no blood return noted. Chest xray done this morning reveals that PICC line is in the correct position in the low SVC. tPA instilled 1113 per orders, after two hours at 1330, not able to flush or aspirate blood. Waited an additional two hours. At 1530, still not able to aspirate blood or flush lines. MD notified, second dose of tPA instilled per orders into PICC line at 1653. Returned to patient bedside at [facility]. Able to instill vigorous flush to both ports and after 3 flushes, able to aspirate blood from both ports. PICC can be positional in a neutral arm lying position, but when arm is elevated and pulled out from body, PICC easily flushes and aspirates blood from both ports. PICC line dressing was to be changed tomorrow per pt, but IV RN changed due to peeling from corners noted during assessment. Primary RN notified of successful declotting and that PICC is good to use.

## 2020-09-01 NOTE — Progress Notes (Signed)
ASSUMPTION OF CARE NOTE   09/01/2020 1:22 PM  Tammie [name] was seen and examined.  The H&P by the admitting provider, orders, imaging was reviewed.  Please see new orders.  Will continue to follow.   Vitals:   09/01/20 0825 09/01/20 1000  BP: 138/75 130/80  Pulse: 98 95  Resp: 18 (!) 23  Temp:    SpO2: 95% 96%    Results for orders placed or performed during the hospital encounter of 08/31/20  Blood Culture (routine x 2)   Specimen: BLOOD LEFT FOREARM  Result Value Ref Range   Specimen Description BLOOD LEFT FOREARM    Special Requests      BOTTLES DRAWN AEROBIC AND ANAEROBIC Blood Culture adequate volume   Culture      NO GROWTH < 12 HOURS Performed at [facility], [address], [place], [place] [address]    Report Status PENDING   Blood Culture (routine x 2)   Specimen: BLOOD LEFT WRIST  Result Value Ref Range   Specimen Description BLOOD LEFT WRIST    Special Requests      BOTTLES DRAWN AEROBIC AND ANAEROBIC Blood Culture adequate volume   Culture      NO GROWTH < 12 HOURS Performed at [facility], [address] [place], [place] [address]    Report Status PENDING   SARS Coronavirus 2 by RT PCR (hospital order, performed in [facility] hospital lab) Nasopharyngeal Nasopharyngeal Swab   Specimen: Nasopharyngeal Swab  Result Value Ref Range   SARS Coronavirus 2 NEGATIVE NEGATIVE  Culture, blood (x 2)   Specimen: BLOOD LEFT WRIST  Result Value Ref Range   Specimen Description BLOOD LEFT WRIST    Special Requests      BOTTLES DRAWN AEROBIC AND ANAEROBIC Blood Culture adequate volume Performed at [facility], [address] [place], [place] [address]    Culture PENDING    Report Status PENDING   Culture, blood (x 2)   Specimen: BLOOD LEFT WRIST  Result Value Ref Range   Specimen Description BLOOD LEFT HAND    Special Requests      BOTTLES DRAWN AEROBIC ONLY Blood Culture results may not be optimal due to an inadequate volume of blood received in culture  bottles Performed at [facility], [address] [place], [place] [address]    Culture PENDING    Report Status PENDING   Lactic acid, plasma  Result Value Ref Range   Lactic Acid, Venous 1.1 0.5 - 1.9 mmol/L  CBC with Differential  Result Value Ref Range   WBC 21.5 (H) 4.0 - 10.5 K/uL   RBC 2.85 (L) 3.87 - 5.11 MIL/uL   Hemoglobin 8.5 (L) 12.0 - 15.0 g/dL   HCT 41.7 (L) 40.8 - 14.4 %   MCV 93.7 80.0 - 100.0 fL   MCH 29.8 26.0 - 34.0 pg   MCHC 31.8 30.0 - 36.0 g/dL   RDW 81.8 56.3 - 14.9 %   Platelets 385 150 - 400 K/uL   nRBC 0.0 0.0 - 0.2 %   Neutrophils Relative % 92 %   Neutro Abs 19.7 (H) 1.7 - 7.7 K/uL   Lymphocytes Relative 3 %   Lymphs Abs 0.7 0.7 - 4.0 K/uL   Monocytes Relative 3 %   Monocytes Absolute 0.6 0.1 - 1.0 K/uL   Eosinophils Relative 1 %   Eosinophils Absolute 0.1 0.0 - 0.5 K/uL   Basophils Relative 0 %   Basophils Absolute 0.1 0.0 - 0.1 K/uL  Immature Granulocytes 1 %   Abs Immature Granulocytes 0.23 (H) 0.00 - 0.07 K/uL  Urinalysis, Routine w reflex microscopic Urine, Clean Catch  Result Value Ref Range   Color, Urine AMBER (A) YELLOW   APPearance TURBID (A) CLEAR   Specific Gravity, Urine 1.006 1.005 - 1.030   pH 6.0 5.0 - 8.0   Glucose, UA 50 (A) NEGATIVE mg/dL   Hgb urine dipstick MODERATE (A) NEGATIVE   Bilirubin Urine NEGATIVE NEGATIVE   Ketones, ur NEGATIVE NEGATIVE mg/dL   Protein, ur 527 (A) NEGATIVE mg/dL   Nitrite NEGATIVE NEGATIVE   Leukocytes,Ua MODERATE (A) NEGATIVE   RBC / HPF 21-50 0 - 5 RBC/hpf   WBC, UA 11-20 0 - 5 WBC/hpf   Bacteria, UA FEW (A) NONE SEEN   WBC Clumps PRESENT    Non Squamous Epithelial 11-20 (A) NONE SEEN  Comprehensive metabolic panel  Result Value Ref Range   Sodium 137 135 - 145 mmol/L   Potassium 3.0 (L) 3.5 - 5.1 mmol/L   Chloride 100 98 - 111 mmol/L   CO2 25 22 - 32 mmol/L   Glucose, Bld 165 (H) 70 - 99 mg/dL   BUN 10 6 - 20 mg/dL   Creatinine, Ser 7.82 0.44 - 1.00 mg/dL   Calcium 8.7 (L) 8.9 -  10.3 mg/dL   Total Protein 6.5 6.5 - 8.1 g/dL   Albumin 2.7 (L) 3.5 - 5.0 g/dL   AST 15 15 - 41 U/L   ALT 9 0 - 44 U/L   Alkaline Phosphatase 207 (H) 38 - 126 U/L   Total Bilirubin 0.6 0.3 - 1.2 mg/dL   GFR, Estimated >42 >35 mL/min   Anion gap 12 5 - 15  Protime-INR  Result Value Ref Range   Prothrombin Time 14.4 11.4 - 15.2 seconds   INR 1.2 0.8 - 1.2  APTT  Result Value Ref Range   aPTT 34 24 - 36 seconds  Basic metabolic panel  Result Value Ref Range   Sodium 135 135 - 145 mmol/L   Potassium 2.9 (L) 3.5 - 5.1 mmol/L   Chloride 99 98 - 111 mmol/L   CO2 26 22 - 32 mmol/L   Glucose, Bld 187 (H) 70 - 99 mg/dL   BUN 11 6 - 20 mg/dL   Creatinine, Ser 3.61 0.44 - 1.00 mg/dL   Calcium 8.4 (L) 8.9 - 10.3 mg/dL   GFR, Estimated >44 >31 mL/min   Anion gap 10 5 - 15  Hepatic function panel  Result Value Ref Range   Total Protein 6.4 (L) 6.5 - 8.1 g/dL   Albumin 2.5 (L) 3.5 - 5.0 g/dL   AST 15 15 - 41 U/L   ALT 9 0 - 44 U/L   Alkaline Phosphatase 205 (H) 38 - 126 U/L   Total Bilirubin 0.6 0.3 - 1.2 mg/dL   Bilirubin, Direct 0.1 0.0 - 0.2 mg/dL   Indirect Bilirubin 0.5 0.3 - 0.9 mg/dL  CBC with Differential  Result Value Ref Range   WBC 9.1 4.0 - 10.5 K/uL   RBC 2.56 (L) 3.87 - 5.11 MIL/uL   Hemoglobin 7.4 (L) 12.0 - 15.0 g/dL   HCT 54.0 (L) 08.6 - 76.1 %   MCV 95.3 80.0 - 100.0 fL   MCH 28.9 26.0 - 34.0 pg   MCHC 30.3 30.0 - 36.0 g/dL   RDW 95.0 93.2 - 67.1 %   Platelets 352 150 - 400 K/uL   nRBC 0.0 0.0 - 0.2 %  Neutrophils Relative % 75 %   Neutro Abs 6.8 1.7 - 7.7 K/uL   Lymphocytes Relative 14 %   Lymphs Abs 1.2 0.7 - 4.0 K/uL   Monocytes Relative 8 %   Monocytes Absolute 0.8 0.1 - 1.0 K/uL   Eosinophils Relative 2 %   Eosinophils Absolute 0.2 0.0 - 0.5 K/uL   Basophils Relative 0 %   Basophils Absolute 0.0 0.0 - 0.1 K/uL   Immature Granulocytes 1 %   Abs Immature Granulocytes 0.05 0.00 - 0.07 K/uL  Comprehensive metabolic panel  Result Value Ref Range    Sodium 139 135 - 145 mmol/L   Potassium 3.5 3.5 - 5.1 mmol/L   Chloride 105 98 - 111 mmol/L   CO2 25 22 - 32 mmol/L   Glucose, Bld 155 (H) 70 - 99 mg/dL   BUN 8 6 - 20 mg/dL   Creatinine, Ser 2.22 0.44 - 1.00 mg/dL   Calcium 8.6 (L) 8.9 - 10.3 mg/dL   Total Protein 6.0 (L) 6.5 - 8.1 g/dL   Albumin 2.5 (L) 3.5 - 5.0 g/dL   AST 15 15 - 41 U/L   ALT 8 0 - 44 U/L   Alkaline Phosphatase 182 (H) 38 - 126 U/L   Total Bilirubin 0.7 0.3 - 1.2 mg/dL   GFR, Estimated >97 >98 mL/min   Anion gap 9 5 - 15  Magnesium  Result Value Ref Range   Magnesium 1.5 (L) 1.7 - 2.4 mg/dL  CBG monitoring, ED  Result Value Ref Range   Glucose-Capillary 149 (H) 70 - 99 mg/dL  Troponin I (High Sensitivity)  Result Value Ref Range   Troponin I (High Sensitivity) 26 (H) <18 ng/L  Troponin I (High Sensitivity)  Result Value Ref Range   Troponin I (High Sensitivity) 23 (H) <18 ng/L   C. Laural Benes, MD [facility]   08/31/2020 10:10 PM How to contact the [facility] Attending or Consulting provider 7A - 7P or covering provider during after hours 7P -7A, for this patient?  1. Check the care team in [facility] and look for a) attending/consulting [facility] provider listed and b) the [facility] team listed 2. Log into [email or web address] and use [place]'s universal password to access. If you do not have the password, please contact the hospital operator. 3. Locate the [facility] provider you are looking for under [facility] and page to a number that you can be directly reached. 4. If you still have difficulty reaching the provider, please page the [facility] (Director on Call) for the Hospitalists listed on amion for assistance.

## 2020-09-01 NOTE — ED Notes (Signed)
Pt returned from CT Scan 

## 2020-09-01 NOTE — Progress Notes (Signed)
Spoke with Francella Solian re PICC order.  Instructed to change caps and flush for blood return.  If that does not work, she will contact IV Team for further instructions.  States PICC line does not appear to be infected and infuses/ flushes  Easily, only problem is no blood return.

## 2020-09-01 NOTE — Progress Notes (Signed)
IR has received request for nephrostomy tube evaluation. Per order, the tube has been slightly pulled out. CT imaging today shows the nephrostomy tube to be in proper position within the kidney and the perinephric abscess is also in the proper position. Per the ED RN, the nephrostomy tube is draining as is the perinephric abscess drain. RN states there are no sutures in place for the drains. RN was requested to place Stat-locks at each drain site to secure the tubes.   IR will plan to see the patient at her next clinic visit or for a routine nephrostomy tube exchange. No IR procedure planned and the order will be deleted.   Please call IR with any questions.  Alwyn Ren, [place] [phone] 09/01/2020, 2:21 PM

## 2020-09-01 NOTE — ED Notes (Signed)
Stiches from pts nephrostomy drain has detached from patients skin. Green drainage noted around the site. MD made aware.

## 2020-09-02 ENCOUNTER — Telehealth: Payer: Self-pay

## 2020-09-02 DIAGNOSIS — D638 Anemia in other chronic diseases classified elsewhere: Secondary | ICD-10-CM | POA: Diagnosis present

## 2020-09-02 DIAGNOSIS — N1330 Unspecified hydronephrosis: Secondary | ICD-10-CM | POA: Diagnosis present

## 2020-09-02 DIAGNOSIS — Z794 Long term (current) use of insulin: Secondary | ICD-10-CM | POA: Diagnosis not present

## 2020-09-02 DIAGNOSIS — E876 Hypokalemia: Secondary | ICD-10-CM | POA: Diagnosis present

## 2020-09-02 DIAGNOSIS — I10 Essential (primary) hypertension: Secondary | ICD-10-CM

## 2020-09-02 DIAGNOSIS — Z936 Other artificial openings of urinary tract status: Secondary | ICD-10-CM | POA: Diagnosis not present

## 2020-09-02 DIAGNOSIS — Z6830 Body mass index (BMI) 30.0-30.9, adult: Secondary | ICD-10-CM | POA: Diagnosis not present

## 2020-09-02 DIAGNOSIS — E669 Obesity, unspecified: Secondary | ICD-10-CM | POA: Diagnosis present

## 2020-09-02 DIAGNOSIS — A419 Sepsis, unspecified organism: Secondary | ICD-10-CM | POA: Diagnosis present

## 2020-09-02 DIAGNOSIS — T83022A Displacement of nephrostomy catheter, initial encounter: Secondary | ICD-10-CM

## 2020-09-02 DIAGNOSIS — R0602 Shortness of breath: Secondary | ICD-10-CM | POA: Diagnosis present

## 2020-09-02 DIAGNOSIS — Z90710 Acquired absence of both cervix and uterus: Secondary | ICD-10-CM | POA: Diagnosis not present

## 2020-09-02 DIAGNOSIS — Z20822 Contact with and (suspected) exposure to covid-19: Secondary | ICD-10-CM | POA: Diagnosis present

## 2020-09-02 DIAGNOSIS — R1032 Left lower quadrant pain: Secondary | ICD-10-CM | POA: Diagnosis not present

## 2020-09-02 DIAGNOSIS — E1165 Type 2 diabetes mellitus with hyperglycemia: Secondary | ICD-10-CM | POA: Diagnosis present

## 2020-09-02 DIAGNOSIS — N12 Tubulo-interstitial nephritis, not specified as acute or chronic: Secondary | ICD-10-CM | POA: Diagnosis present

## 2020-09-02 DIAGNOSIS — N151 Renal and perinephric abscess: Secondary | ICD-10-CM

## 2020-09-02 DIAGNOSIS — R778 Other specified abnormalities of plasma proteins: Secondary | ICD-10-CM | POA: Diagnosis not present

## 2020-09-02 DIAGNOSIS — N390 Urinary tract infection, site not specified: Secondary | ICD-10-CM | POA: Diagnosis not present

## 2020-09-02 DIAGNOSIS — K6819 Other retroperitoneal abscess: Secondary | ICD-10-CM | POA: Diagnosis present

## 2020-09-02 DIAGNOSIS — I959 Hypotension, unspecified: Secondary | ICD-10-CM | POA: Diagnosis present

## 2020-09-02 DIAGNOSIS — Z885 Allergy status to narcotic agent status: Secondary | ICD-10-CM | POA: Diagnosis not present

## 2020-09-02 DIAGNOSIS — E8809 Other disorders of plasma-protein metabolism, not elsewhere classified: Secondary | ICD-10-CM | POA: Diagnosis not present

## 2020-09-02 LAB — URINE CULTURE: Culture: >=100000 — AB

## 2020-09-02 LAB — GLUCOSE, CAPILLARY
Glucose-Capillary: 126 mg/dL — ABNORMAL HIGH (ref 70–99)
Glucose-Capillary: 144 mg/dL — ABNORMAL HIGH (ref 70–99)
Glucose-Capillary: 167 mg/dL — ABNORMAL HIGH (ref 70–99)
Glucose-Capillary: 173 mg/dL — ABNORMAL HIGH (ref 70–99)

## 2020-09-02 MED ORDER — CHLORHEXIDINE GLUCONATE CLOTH 2 % EX PADS
6.0000 | MEDICATED_PAD | Freq: Every day | CUTANEOUS | Status: DC
  Administered 2020-09-02 – 2020-09-05 (×4): 6 via TOPICAL

## 2020-09-02 NOTE — Progress Notes (Signed)
Patient concerned about decrease in output of drainage bag (#2).  Drainage back #1 has had normal output since admission.  Drainage bag has not had same amount of output as patient has had at home.

## 2020-09-02 NOTE — Telephone Encounter (Signed)
Patient called front desk regarding appointment scheduled for 1/27. States she is currently admitted still at the hospital and does not believe she will be discharged before appt. Next available is not until 2/21.  Will forward message to MD to advise when patient should follow up.

## 2020-09-02 NOTE — Consult Note (Signed)
Urology Consult  Referring physician: Dr. Kerry Hough Reason for referral: Perinephric absces  Chief Complaint: Left flank pain  History of Present Illness: Tammie [name] is a 59yo with a hx of emphysematous pyelonephritis diagnosed 08/01/2020 and managed with nephrostomy tube and 2 perinephric drains. She was improving until 12/23 when her drains stopped having output and she developed chills. She underwent CT abd with contrast which shows a 12cm perinephric abscess that in slightly improved since discharge from [facility] on 08/18/20. She continues to have intermittent mild left flank pain. The drains have scant purelent drainage in the currently. She is currently on broad spectrum antibiotics.  Past Medical History:  Diagnosis Date  . Acute pyelonephritis   . Class 1 obesity   . Hypertension   . Lung nodules   . Multifocal pneumonia 07/31/2020  . Perirenal abscess   . Pleural effusion    Past Surgical History:  Procedure Laterality Date  . ABDOMINAL HYSTERECTOMY      Medications: I have reviewed the patient's current medications. Allergies:  Allergies  Allergen Reactions  . Codeine Rash    Family History  Problem Relation Age of Onset  . Huntington's disease Father   . Diabetes Sister    Social History:  reports that she has never smoked. She has never used smokeless tobacco. She reports previous alcohol use. She reports previous drug use.  Review of Systems  Constitutional: Positive for chills, diaphoresis and fatigue.  Genitourinary: Positive for flank pain.  All other systems reviewed and are negative.   Physical Exam:  Vital signs in last 24 hours: Temp:  [98.4 F (36.9 C)-99.7 F (37.6 C)] 98.6 F (37 C) (01/25 1357) Pulse Rate:  [92-101] 98 (01/25 1357) Resp:  [18-24] 18 (01/25 1357) BP: (121-146)/(56-75) 146/75 (01/25 1357) SpO2:  [96 %-100 %] 96 % (01/25 1357) Physical Exam Vitals and nursing note reviewed.  Constitutional:      Appearance: She is  well-developed.  HENT:     Head: Normocephalic and atraumatic.  Eyes:     Extraocular Movements: Extraocular movements intact.     Pupils: Pupils are equal, round, and reactive to light.  Cardiovascular:     Rate and Rhythm: Normal rate and regular rhythm.  Pulmonary:     Effort: Pulmonary effort is normal. No respiratory distress.  Abdominal:     Palpations: Abdomen is soft.     Tenderness: There is no abdominal tenderness.  Musculoskeletal:        General: Normal range of motion.     Cervical back: Normal range of motion and neck supple.  Skin:    General: Skin is warm and dry.  Neurological:     General: No focal deficit present.     Mental Status: She is alert and oriented to person, place, and time.  Psychiatric:        Mood and Affect: Mood normal.        Behavior: Behavior normal.     Laboratory Data:  Results for orders placed or performed during the hospital encounter of 08/31/20 (from the past 72 hour(s))  Lactic acid, plasma     Status: None   Collection Time: 08/31/20  7:16 PM  Result Value Ref Range   Lactic Acid, Venous 1.1 0.5 - 1.9 mmol/L    Comment: Performed at [facility], [address] [place], [place] [address]  CBC with Differential     Status: Abnormal   Collection Time: 08/31/20  7:16 PM  Result Value Ref Range  WBC 21.5 (H) 4.0 - 10.5 K/uL   RBC 2.85 (L) 3.87 - 5.11 MIL/uL   Hemoglobin 8.5 (L) 12.0 - 15.0 g/dL   HCT 16.1 (L) 09.6 - 04.5 %   MCV 93.7 80.0 - 100.0 fL   MCH 29.8 26.0 - 34.0 pg   MCHC 31.8 30.0 - 36.0 g/dL   RDW 40.9 81.1 - 91.4 %   Platelets 385 150 - 400 K/uL   nRBC 0.0 0.0 - 0.2 %   Neutrophils Relative % 92 %   Neutro Abs 19.7 (H) 1.7 - 7.7 K/uL   Lymphocytes Relative 3 %   Lymphs Abs 0.7 0.7 - 4.0 K/uL   Monocytes Relative 3 %   Monocytes Absolute 0.6 0.1 - 1.0 K/uL   Eosinophils Relative 1 %   Eosinophils Absolute 0.1 0.0 - 0.5 K/uL   Basophils Relative 0 %   Basophils Absolute 0.1 0.0 - 0.1 K/uL   Immature  Granulocytes 1 %   Abs Immature Granulocytes 0.23 (H) 0.00 - 0.07 K/uL    Comment: Performed at [facility], [address] [place], [place] [address]  Protime-INR     Status: None   Collection Time: 08/31/20 11:15 PM  Result Value Ref Range   Prothrombin Time 14.4 11.4 - 15.2 seconds   INR 1.2 0.8 - 1.2    Comment: (NOTE) INR goal varies based on device and disease states. Performed at [facility], [address] [place], [place] [address]   APTT     Status: None   Collection Time: 08/31/20 11:15 PM  Result Value Ref Range   aPTT 34 24 - 36 seconds    Comment: Performed at [facility], [address], [place], [place] [address]  Blood Culture (routine x 2)     Status: None (Preliminary result)   Collection Time: 08/31/20 11:15 PM   Specimen: BLOOD LEFT FOREARM  Result Value Ref Range   Specimen Description BLOOD LEFT FOREARM    Special Requests      BOTTLES DRAWN AEROBIC AND ANAEROBIC Blood Culture adequate volume   Culture      NO GROWTH 2 DAYS Performed at [facility], [address], [place], [place] [address]    Report Status PENDING   Troponin I (High Sensitivity)     Status: Abnormal   Collection Time: 08/31/20 11:15 PM  Result Value Ref Range   Troponin I (High Sensitivity) 26 (H) <18 ng/L    Comment: (NOTE) Elevated high sensitivity troponin I (hsTnI) values and significant  changes across serial measurements may suggest ACS but many other  chronic and acute conditions are known to elevate hsTnI results.  Refer to the "Links" section for chest pain algorithms and additional  guidance. Performed at [facility], [address] [place], [place] [address]   Basic metabolic panel     Status: Abnormal   Collection Time: 08/31/20 11:15 PM  Result Value Ref Range   Sodium 135 135 - 145 mmol/L   Potassium 2.9 (L) 3.5 - 5.1 mmol/L   Chloride 99 98 - 111 mmol/L   CO2 26 22 - 32 mmol/L   Glucose, Bld 187 (H) 70 - 99 mg/dL    Comment: Glucose reference range applies  only to samples taken after fasting for at least 8 hours.   BUN 11 6 - 20 mg/dL   Creatinine, Ser 1.32 0.44 - 1.00 mg/dL   Calcium 8.4 (L) 8.9 - 10.3 mg/dL   GFR, Estimated >44 >01 mL/min  Comment: (NOTE) Calculated using the CKD-EPI Creatinine Equation (2021)    Anion gap 10 5 - 15    Comment: Performed at [facility], [address] [place], [place] [address]  Hepatic function panel     Status: Abnormal   Collection Time: 08/31/20 11:15 PM  Result Value Ref Range   Total Protein 6.4 (L) 6.5 - 8.1 g/dL   Albumin 2.5 (L) 3.5 - 5.0 g/dL   AST 15 15 - 41 U/L   ALT 9 0 - 44 U/L   Alkaline Phosphatase 205 (H) 38 - 126 U/L   Total Bilirubin 0.6 0.3 - 1.2 mg/dL   Bilirubin, Direct 0.1 0.0 - 0.2 mg/dL   Indirect Bilirubin 0.5 0.3 - 0.9 mg/dL    Comment: Performed at [facility], [address] [place], [place] [address]  SARS Coronavirus 2 by RT PCR (hospital order, performed in [facility] hospital lab) Nasopharyngeal Nasopharyngeal Swab     Status: None   Collection Time: 08/31/20 11:37 PM   Specimen: Nasopharyngeal Swab  Result Value Ref Range   SARS Coronavirus 2 NEGATIVE NEGATIVE    Comment: (NOTE) SARS-CoV-2 target nucleic acids are NOT DETECTED.  The SARS-CoV-2 RNA is generally detectable in upper and lower respiratory specimens during the acute phase of infection. The lowest concentration of SARS-CoV-2 viral copies this assay can detect is 250 copies / mL. A negative result does not preclude SARS-CoV-2 infection and should not be used as the sole basis for treatment or other patient management decisions.  A negative result may occur with improper specimen collection / handling, submission of specimen other than nasopharyngeal swab, presence of viral mutation(s) within the areas targeted by this assay, and inadequate number of viral copies (<250 copies / mL). A negative result must be combined with clinical observations, patient history, and epidemiological  information.  Fact Sheet for Patients:   BoilerBrush.com.cyhttps://www.fda.gov/media/136312/download  Fact Sheet for Healthcare Providers: https://pope.com/https://www.fda.gov/media/136313/download  This test is not yet approved or  cleared by the [place] FDA and has been authorized for detection and/or diagnosis of SARS-CoV-2 by FDA under an Emergency Use Authorization (EUA).  This EUA will remain in effect (meaning this test can be used) for the duration of the COVID-19 declaration under Section 564(b)(1) of the Act, 21 U.S.C. section 360bbb-3(b)(1), unless the authorization is terminated or revoked sooner.  Performed at [facility], [address] [place], [place] [address]   Urinalysis, Routine w reflex microscopic Urine, Clean Catch     Status: Abnormal   Collection Time: 09/01/20 12:05 AM  Result Value Ref Range   Color, Urine AMBER (A) YELLOW    Comment: BIOCHEMICALS MAY BE AFFECTED BY COLOR   APPearance TURBID (A) CLEAR   Specific Gravity, Urine 1.006 1.005 - 1.030   pH 6.0 5.0 - 8.0   Glucose, UA 50 (A) NEGATIVE mg/dL   Hgb urine dipstick MODERATE (A) NEGATIVE   Bilirubin Urine NEGATIVE NEGATIVE   Ketones, ur NEGATIVE NEGATIVE mg/dL   Protein, ur 914100 (A) NEGATIVE mg/dL   Nitrite NEGATIVE NEGATIVE   Leukocytes,Ua MODERATE (A) NEGATIVE   RBC / HPF 21-50 0 - 5 RBC/hpf   WBC, UA 11-20 0 - 5 WBC/hpf   Bacteria, UA FEW (A) NONE SEEN   WBC Clumps PRESENT    Non Squamous Epithelial 11-20 (A) NONE SEEN    Comment: Performed at [facility], [address] [place], [place] [address]  Urine culture     Status: Abnormal   Collection Time: 09/01/20  12:05 AM   Specimen: In/Out Cath Urine  Result Value Ref Range   Specimen Description      IN/OUT CATH URINE Performed at [facility], [address] [place], [place] [address]    Special Requests      NONE Performed at [facility], [address] [place], [place] [address]    Culture >=100,000 COLONIES/mL YEAST (A)    Report Status 09/02/2020 FINAL    Comprehensive metabolic panel     Status: Abnormal   Collection Time: 09/01/20  1:10 AM  Result Value Ref Range   Sodium 137 135 - 145 mmol/L   Potassium 3.0 (L) 3.5 - 5.1 mmol/L   Chloride 100 98 - 111 mmol/L   CO2 25 22 - 32 mmol/L   Glucose, Bld 165 (H) 70 - 99 mg/dL    Comment: Glucose reference range applies only to samples taken after fasting for at least 8 hours.   BUN 10 6 - 20 mg/dL   Creatinine, Ser 0.980.79 0.44 - 1.00 mg/dL   Calcium 8.7 (L) 8.9 - 10.3 mg/dL   Total Protein 6.5 6.5 - 8.1 g/dL   Albumin 2.7 (L) 3.5 - 5.0 g/dL   AST 15 15 - 41 U/L   ALT 9 0 - 44 U/L   Alkaline Phosphatase 207 (H) 38 - 126 U/L   Total Bilirubin 0.6 0.3 - 1.2 mg/dL   GFR, Estimated >11>60 >91>60 mL/min    Comment: (NOTE) Calculated using the CKD-EPI Creatinine Equation (2021)    Anion gap 12 5 - 15    Comment: Performed at [facility], [address] [place], [place] [address]  Blood Culture (routine x 2)     Status: None (Preliminary result)   Collection Time: 09/01/20  1:10 AM   Specimen: BLOOD LEFT WRIST  Result Value Ref Range   Specimen Description BLOOD LEFT WRIST    Special Requests      BOTTLES DRAWN AEROBIC AND ANAEROBIC Blood Culture adequate volume   Culture      NO GROWTH 1 DAY Performed at [facility], [address] [place], [place] [address]    Report Status PENDING   Troponin I (High Sensitivity)     Status: Abnormal   Collection Time: 09/01/20  1:10 AM  Result Value Ref Range   Troponin I (High Sensitivity) 23 (H) <18 ng/L    Comment: (NOTE) Elevated high sensitivity troponin I (hsTnI) values and significant  changes across serial measurements may suggest ACS but many other  chronic and acute conditions are known to elevate hsTnI results.  Refer to the "Links" section for chest pain algorithms and additional  guidance. Performed at [facility], [address] [place], [place] [address]   Culture, blood (x 2)     Status: None (Preliminary result)   Collection Time:  09/01/20  7:43 AM   Specimen: BLOOD LEFT WRIST  Result Value Ref Range   Specimen Description BLOOD LEFT WRIST    Special Requests      BOTTLES DRAWN AEROBIC AND ANAEROBIC Blood Culture adequate volume   Culture      NO GROWTH 1 DAY Performed at [facility], [address] [place], [place] [address]    Report Status PENDING   CBC with Differential     Status: Abnormal   Collection Time: 09/01/20  7:44 AM  Result Value Ref Range   WBC 9.1 4.0 - 10.5 K/uL   RBC 2.56 (L) 3.87 - 5.11 MIL/uL   Hemoglobin 7.4 (  L) 12.0 - 15.0 g/dL   HCT 40.9 (L) 81.1 - 91.4 %   MCV 95.3 80.0 - 100.0 fL   MCH 28.9 26.0 - 34.0 pg   MCHC 30.3 30.0 - 36.0 g/dL   RDW 78.2 95.6 - 21.3 %   Platelets 352 150 - 400 K/uL   nRBC 0.0 0.0 - 0.2 %   Neutrophils Relative % 75 %   Neutro Abs 6.8 1.7 - 7.7 K/uL   Lymphocytes Relative 14 %   Lymphs Abs 1.2 0.7 - 4.0 K/uL   Monocytes Relative 8 %   Monocytes Absolute 0.8 0.1 - 1.0 K/uL   Eosinophils Relative 2 %   Eosinophils Absolute 0.2 0.0 - 0.5 K/uL   Basophils Relative 0 %   Basophils Absolute 0.0 0.0 - 0.1 K/uL   Immature Granulocytes 1 %   Abs Immature Granulocytes 0.05 0.00 - 0.07 K/uL    Comment: Performed at [facility], [address] [place], [place] [address]  Comprehensive metabolic panel     Status: Abnormal   Collection Time: 09/01/20  7:44 AM  Result Value Ref Range   Sodium 139 135 - 145 mmol/L   Potassium 3.5 3.5 - 5.1 mmol/L   Chloride 105 98 - 111 mmol/L   CO2 25 22 - 32 mmol/L   Glucose, Bld 155 (H) 70 - 99 mg/dL    Comment: Glucose reference range applies only to samples taken after fasting for at least 8 hours.   BUN 8 6 - 20 mg/dL   Creatinine, Ser 8.46 0.44 - 1.00 mg/dL   Calcium 8.6 (L) 8.9 - 10.3 mg/dL   Total Protein 6.0 (L) 6.5 - 8.1 g/dL   Albumin 2.5 (L) 3.5 - 5.0 g/dL   AST 15 15 - 41 U/L   ALT 8 0 - 44 U/L   Alkaline Phosphatase 182 (H) 38 - 126 U/L   Total Bilirubin 0.7 0.3 - 1.2 mg/dL   GFR, Estimated >96 >29 mL/min     Comment: (NOTE) Calculated using the CKD-EPI Creatinine Equation (2021)    Anion gap 9 5 - 15    Comment: Performed at [facility], [address], [place], [place] [address]  Magnesium     Status: Abnormal   Collection Time: 09/01/20  7:44 AM  Result Value Ref Range   Magnesium 1.5 (L) 1.7 - 2.4 mg/dL    Comment: Performed at [facility], [address], [place], [place] [address]  CBG monitoring, ED     Status: Abnormal   Collection Time: 09/01/20  8:31 AM  Result Value Ref Range   Glucose-Capillary 149 (H) 70 - 99 mg/dL    Comment: Glucose reference range applies only to samples taken after fasting for at least 8 hours.  Culture, blood (x 2)     Status: None (Preliminary result)   Collection Time: 09/01/20  9:14 AM   Specimen: BLOOD LEFT HAND  Result Value Ref Range   Specimen Description BLOOD LEFT HAND    Special Requests      BOTTLES DRAWN AEROBIC ONLY Blood Culture results may not be optimal due to an inadequate volume of blood received in culture bottles   Culture      NO GROWTH < 24 HOURS Performed at [facility], [address], [place], [place] [address]    Report Status PENDING   CBG monitoring, ED     Status: Abnormal   Collection Time: 09/01/20  1:27 PM  Result Value Ref Range  Glucose-Capillary 118 (H) 70 - 99 mg/dL    Comment: Glucose reference range applies only to samples taken after fasting for at least 8 hours.  CBG monitoring, ED     Status: Abnormal   Collection Time: 09/01/20  5:49 PM  Result Value Ref Range   Glucose-Capillary 138 (H) 70 - 99 mg/dL    Comment: Glucose reference range applies only to samples taken after fasting for at least 8 hours.  Glucose, capillary     Status: Abnormal   Collection Time: 09/01/20  8:54 PM  Result Value Ref Range   Glucose-Capillary 141 (H) 70 - 99 mg/dL    Comment: Glucose reference range applies only to samples taken after fasting for at least 8 hours.  Glucose, capillary     Status: Abnormal   Collection  Time: 09/02/20  7:32 AM  Result Value Ref Range   Glucose-Capillary 126 (H) 70 - 99 mg/dL    Comment: Glucose reference range applies only to samples taken after fasting for at least 8 hours.  Glucose, capillary     Status: Abnormal   Collection Time: 09/02/20 11:21 AM  Result Value Ref Range   Glucose-Capillary 144 (H) 70 - 99 mg/dL    Comment: Glucose reference range applies only to samples taken after fasting for at least 8 hours.   Recent Results (from the past 240 hour(s))  Blood Culture (routine x 2)     Status: None (Preliminary result)   Collection Time: 08/31/20 11:15 PM   Specimen: BLOOD LEFT FOREARM  Result Value Ref Range Status   Specimen Description BLOOD LEFT FOREARM  Final   Special Requests   Final    BOTTLES DRAWN AEROBIC AND ANAEROBIC Blood Culture adequate volume   Culture   Final    NO GROWTH 2 DAYS Performed at [facility], [address], [place], [place] [address]    Report Status PENDING  Incomplete  SARS Coronavirus 2 by RT PCR (hospital order, performed in [facility] hospital lab) Nasopharyngeal Nasopharyngeal Swab     Status: None   Collection Time: 08/31/20 11:37 PM   Specimen: Nasopharyngeal Swab  Result Value Ref Range Status   SARS Coronavirus 2 NEGATIVE NEGATIVE Final    Comment: (NOTE) SARS-CoV-2 target nucleic acids are NOT DETECTED.  The SARS-CoV-2 RNA is generally detectable in upper and lower respiratory specimens during the acute phase of infection. The lowest concentration of SARS-CoV-2 viral copies this assay can detect is 250 copies / mL. A negative result does not preclude SARS-CoV-2 infection and should not be used as the sole basis for treatment or other patient management decisions.  A negative result may occur with improper specimen collection / handling, submission of specimen other than nasopharyngeal swab, presence of viral mutation(s) within the areas targeted by this assay, and inadequate number of viral copies (<250 copies  / mL). A negative result must be combined with clinical observations, patient history, and epidemiological information.  Fact Sheet for Patients:   BoilerBrush.com.cy  Fact Sheet for Healthcare Providers: https://pope.com/  This test is not yet approved or  cleared by the [place] FDA and has been authorized for detection and/or diagnosis of SARS-CoV-2 by FDA under an Emergency Use Authorization (EUA).  This EUA will remain in effect (meaning this test can be used) for the duration of the COVID-19 declaration under Section 564(b)(1) of the Act, 21 U.S.C. section 360bbb-3(b)(1), unless the authorization is terminated or revoked sooner.  Performed at [facility], [address],  [place], [place] [address]   Urine culture     Status: Abnormal   Collection Time: 09/01/20 12:05 AM   Specimen: In/Out Cath Urine  Result Value Ref Range Status   Specimen Description   Final    IN/OUT CATH URINE Performed at [facility], [address], [place], [place] [address]    Special Requests   Final    NONE Performed at [facility], [address] [place], [place] [address]    Culture >=100,000 COLONIES/mL YEAST (A)  Final   Report Status 09/02/2020 FINAL  Final  Blood Culture (routine x 2)     Status: None (Preliminary result)   Collection Time: 09/01/20  1:10 AM   Specimen: BLOOD LEFT WRIST  Result Value Ref Range Status   Specimen Description BLOOD LEFT WRIST  Final   Special Requests   Final    BOTTLES DRAWN AEROBIC AND ANAEROBIC Blood Culture adequate volume   Culture   Final    NO GROWTH 1 DAY Performed at [facility], [address] [place], [place] [address]    Report Status PENDING  Incomplete  Culture, blood (x 2)     Status: None (Preliminary result)   Collection Time: 09/01/20  7:43 AM   Specimen: BLOOD LEFT WRIST  Result Value Ref Range Status   Specimen Description BLOOD LEFT WRIST  Final   Special Requests   Final     BOTTLES DRAWN AEROBIC AND ANAEROBIC Blood Culture adequate volume   Culture   Final    NO GROWTH 1 DAY Performed at [facility], [address], [place], [place] [address]    Report Status PENDING  Incomplete  Culture, blood (x 2)     Status: None (Preliminary result)   Collection Time: 09/01/20  9:14 AM   Specimen: BLOOD LEFT HAND  Result Value Ref Range Status   Specimen Description BLOOD LEFT HAND  Final   Special Requests   Final    BOTTLES DRAWN AEROBIC ONLY Blood Culture results may not be optimal due to an inadequate volume of blood received in culture bottles   Culture   Final    NO GROWTH < 24 HOURS Performed at [facility], [address], [place], [place] [address]    Report Status PENDING  Incomplete   Creatinine: Recent Labs    08/31/20 2315 09/01/20 0110 09/01/20 0744  CREATININE 0.79 0.79 0.63   Baseline Creatinine: 0.8  Impression/Assessment:  59yo with a left perinephric abscess  Plan:  I discussed the management of emphysemtous pyelonephritis and perinephric abscesses with the patient.  Please flush her perinephric drains and if the drains continue to have minimal output she will need them replaced. Urology to continue to follow.   Tammie [name] 09/02/2020, 3:32 PM

## 2020-09-02 NOTE — Progress Notes (Signed)
Patient is not ready to wear telemetry monitor at this time.

## 2020-09-02 NOTE — Progress Notes (Signed)
PROGRESS NOTE    Tammie [name]  WNU:[id number] DOB: May 08, 1961 DOA: 08/31/2020 PCP: Patient, No Pcp Per    Brief Narrative:  60 year old female with history of diabetes, was recently admitted with acute emphysematous pyelonephritis with perinephric abscess secondary to E. coli. At that time, she underwent several drain placements as well as percutaneous nephrostomy tube placement. She was discharged home on 1/10 with an outpatient course of Ancef until 2/17. Prior to admission, she noticed sudden onset of chills, nausea and vomiting. She had noticed decreased output from one of her abscess drains. She presented to the emergency room where she was noted to be hypotensive and had a significant leukocytosis. She was admitted for further treatments.   Assessment & Plan:   Principal Problem:   Sepsis secondary to UTI [facility]) Active Problems:   Hypertension   Hypokalemia   Hypomagnesemia   Perirenal abscess   Type 2 diabetes mellitus with hyperglycemia (HCC)   Hypoalbuminemia   Nephrostomy tube displaced (HCC)   Perinephric abscess   Left perinephric abscess -Recent admission for the same, she was discharged on 1/10 with percutaneous drain placement for abscess and plans for intravenous Ancef until 2/17 -Patient readmitted with chills, leukocytosis, decreased output from drain -Initial CT scan showed five drains were correctly placed, although one drain was mildly displaced when compared to prior imaging -Patient seen and discussed with urology, Dr. Ronne Binning who recommended patient's abscess drain be replaced -We'll reconsult interventional radiology -Continue IV antibiotics, currently on cefepime -Cultures have not shown any growth -Anticipate transitioning the patient back to cefazolin upon discharge to complete outpatient course  History of hydronephrosis -Left percutaneous nephrostomy tube in place that is draining -Renal function unremarkable  Diabetes -Blood sugars currently  stable -Continue on Lantus and sliding scale insulin  Hypokalemia -Replaced  Anemia -Suspect this is secondary to her infectious/inflammatory process -Hemoglobin checked yesterday was 7.4 -Will be repeated and plan to transfuse for hemoglobin <7  DVT prophylaxis: enoxaparin (LOVENOX) injection 40 mg Start: 09/01/20 1000  Code Status: Full code Family Communication: Discussed with patient Disposition Plan: Status is: Inpatient  Remains inpatient appropriate because:Ongoing diagnostic testing needed not appropriate for outpatient work up   Dispo: The patient is from: Home              Anticipated d/c is to: Home              Anticipated d/c date is: 2 days              Patient currently is not medically stable to d/c.   Difficult to place patient No    Consultants:   Urology  Procedures:     Antimicrobials:   Cefepime 1/24>   Subjective: Continues to complain of some soreness around drains, otherwise she does not have any complaints  Objective: Vitals:   09/01/20 2020 09/02/20 0209 09/02/20 1100 09/02/20 1357  BP: 123/60 124/64 130/65 (!) 146/75  Pulse: 96 99 92 98  Resp: 19 20 19 18   Temp: 99.5 F (37.5 C) 98.8 F (37.1 C) 98.4 F (36.9 C) 98.6 F (37 C)  TempSrc: Oral Oral  Oral  SpO2: 96% 97% 100% 96%  Weight:      Height:        Intake/Output Summary (Last 24 hours) at 09/02/2020 1813 Last data filed at 09/02/2020 1700 Gross per 24 hour  Intake 480 ml  Output 530 ml  Net -50 ml   Filed Weights   08/31/20 1815  Weight: 74.4 kg  Examination:  General exam: Appears calm and comfortable  Respiratory system: Clear to auscultation. Respiratory effort normal. Cardiovascular system: S1 & S2 heard, RRR. No JVD, murmurs, rubs, gallops or clicks. No pedal edema. Gastrointestinal system: Abdomen is nondistended, soft and nontender. No organomegaly or masses felt. Normal bowel sounds heard. Drains including nephrostomy tube present. Minimal output in  abscess drain Central nervous system: Alert and oriented. No focal neurological deficits. Extremities: Symmetric 5 x 5 power. Skin: No rashes, lesions or ulcers Psychiatry: Judgement and insight appear normal. Mood & affect appropriate.     Data Reviewed: I have personally reviewed following labs and imaging studies  CBC: Recent Labs  Lab 08/31/20 1916 09/01/20 0744  WBC 21.5* 9.1  NEUTROABS 19.7* 6.8  HGB 8.5* 7.4*  HCT 26.7* 24.4*  MCV 93.7 95.3  PLT 385 352   Basic Metabolic Panel: Recent Labs  Lab 08/31/20 2315 09/01/20 0110 09/01/20 0744  NA 135 137 139  K 2.9* 3.0* 3.5  CL 99 100 105  CO2 26 25 25   GLUCOSE 187* 165* 155*  BUN 11 10 8   CREATININE 0.79 0.79 0.63  CALCIUM 8.4* 8.7* 8.6*  MG  --   --  1.5*   GFR: Estimated Creatinine Clearance: 69.8 mL/min (by C-G formula based on SCr of 0.63 mg/dL). Liver Function Tests: Recent Labs  Lab 08/31/20 2315 09/01/20 0110 09/01/20 0744  AST 15 15 15   ALT 9 9 8   ALKPHOS 205* 207* 182*  BILITOT 0.6 0.6 0.7  PROT 6.4* 6.5 6.0*  ALBUMIN 2.5* 2.7* 2.5*   No results for input(s): LIPASE, AMYLASE in the last 168 hours. No results for input(s): AMMONIA in the last 168 hours. Coagulation Profile: Recent Labs  Lab 08/31/20 2315  INR 1.2   Cardiac Enzymes: No results for input(s): CKTOTAL, CKMB, CKMBINDEX, TROPONINI in the last 168 hours. BNP (last 3 results) No results for input(s): PROBNP in the last 8760 hours. HbA1C: No results for input(s): HGBA1C in the last 72 hours. CBG: Recent Labs  Lab 09/01/20 1749 09/01/20 2054 09/02/20 0732 09/02/20 1121 09/02/20 1651  GLUCAP 138* 141* 126* 144* 167*   Lipid Profile: No results for input(s): CHOL, HDL, LDLCALC, TRIG, CHOLHDL, LDLDIRECT in the last 72 hours. Thyroid Function Tests: No results for input(s): TSH, T4TOTAL, FREET4, T3FREE, THYROIDAB in the last 72 hours. Anemia Panel: No results for input(s): VITAMINB12, FOLATE, FERRITIN, TIBC, IRON,  RETICCTPCT in the last 72 hours. Sepsis Labs: Recent Labs  Lab 08/31/20 1916  LATICACIDVEN 1.1    Recent Results (from the past 240 hour(s))  Blood Culture (routine x 2)     Status: None (Preliminary result)   Collection Time: 08/31/20 11:15 PM   Specimen: BLOOD LEFT FOREARM  Result Value Ref Range Status   Specimen Description BLOOD LEFT FOREARM  Final   Special Requests   Final    BOTTLES DRAWN AEROBIC AND ANAEROBIC Blood Culture adequate volume   Culture   Final    NO GROWTH 2 DAYS Performed at [facility], [address], [place], 09/02/20 [facility]    Report Status PENDING  Incomplete  SARS Coronavirus 2 by RT PCR (hospital order, performed in [facility] hospital lab) Nasopharyngeal Nasopharyngeal Swab     Status: None   Collection Time: 08/31/20 11:37 PM   Specimen: Nasopharyngeal Swab  Result Value Ref Range Status   SARS Coronavirus 2 NEGATIVE NEGATIVE Final    Comment: (NOTE) SARS-CoV-2 target nucleic acids are NOT DETECTED.  The SARS-CoV-2 RNA is  generally detectable in upper and lower respiratory specimens during the acute phase of infection. The lowest concentration of SARS-CoV-2 viral copies this assay can detect is 250 copies / mL. A negative result does not preclude SARS-CoV-2 infection and should not be used as the sole basis for treatment or other patient management decisions.  A negative result may occur with improper specimen collection / handling, submission of specimen other than nasopharyngeal swab, presence of viral mutation(s) within the areas targeted by this assay, and inadequate number of viral copies (<250 copies / mL). A negative result must be combined with clinical observations, patient history, and epidemiological information.  Fact Sheet for Patients:   BoilerBrush.com.cyhttps://www.fda.gov/media/136312/download  Fact Sheet for Healthcare Providers: https://pope.com/https://www.fda.gov/media/136313/download  This test is not yet approved or  cleared by the [place]  FDA and has been authorized for detection and/or diagnosis of SARS-CoV-2 by FDA under an Emergency Use Authorization (EUA).  This EUA will remain in effect (meaning this test can be used) for the duration of the COVID-19 declaration under Section 564(b)(1) of the Act, 21 U.S.C. section 360bbb-3(b)(1), unless the authorization is terminated or revoked sooner.  Performed at [facility], [address] [place], [place] [address]   Urine culture     Status: Abnormal   Collection Time: 09/01/20 12:05 AM   Specimen: In/Out Cath Urine  Result Value Ref Range Status   Specimen Description   Final    IN/OUT CATH URINE Performed at [facility], [address] [place], [place] [address]    Special Requests   Final    NONE Performed at [facility], [address] [place], [place] [address]    Culture >=100,000 COLONIES/mL YEAST (A)  Final   Report Status 09/02/2020 FINAL  Final  Blood Culture (routine x 2)     Status: None (Preliminary result)   Collection Time: 09/01/20  1:10 AM   Specimen: BLOOD LEFT WRIST  Result Value Ref Range Status   Specimen Description BLOOD LEFT WRIST  Final   Special Requests   Final    BOTTLES DRAWN AEROBIC AND ANAEROBIC Blood Culture adequate volume   Culture   Final    NO GROWTH 1 DAY Performed at [facility], [address] [place], [place] [address]    Report Status PENDING  Incomplete  Culture, blood (x 2)     Status: None (Preliminary result)   Collection Time: 09/01/20  7:43 AM   Specimen: BLOOD LEFT WRIST  Result Value Ref Range Status   Specimen Description BLOOD LEFT WRIST  Final   Special Requests   Final    BOTTLES DRAWN AEROBIC AND ANAEROBIC Blood Culture adequate volume   Culture   Final    NO GROWTH 1 DAY Performed at [facility], [address] [place], [place] [address]    Report Status PENDING  Incomplete  Culture, blood (x 2)     Status: None (Preliminary result)   Collection Time: 09/01/20  9:14 AM   Specimen: BLOOD LEFT HAND   Result Value Ref Range Status   Specimen Description BLOOD LEFT HAND  Final   Special Requests   Final    BOTTLES DRAWN AEROBIC ONLY Blood Culture results may not be optimal due to an inadequate volume of blood received in culture bottles   Culture   Final    NO GROWTH 1 DAY Performed at [facility], [address] [place], [place] [address]    Report Status PENDING  Incomplete  Radiology Studies: DG Chest 2 View  Result Date: 08/31/2020 CLINICAL DATA:  Shortness of breath. Possible PICC line infection. On antibiotics. EXAM: CHEST - 2 VIEW COMPARISON:  08/07/2020 and CT of 08/08/2020. FINDINGS: Left upper quadrant percutaneous drains. A right-sided PICC line terminates at the low SVC. Midline trachea. Mild cardiomegaly. Trace right pleural effusion. No pneumothorax. The dominant right upper lobe septic embolus measuring 2.4 cm today versus 3.1 cm on 08/07/2020 (when remeasured). The underlying pattern of diffuse reticulonodular opacification is difficult to directly compare to the prior AP radiograph, but may be slightly improved. No lobar consolidation. Mild left base subsegmental atelectasis. IMPRESSION: Reticulonodular opacification throughout both lungs, consistent with septic emboli when compared to 08/08/2020 CT. This may be slightly improved compared to a on a AP radiographs most recent 08/07/2020. The right upper lobe dominant lesion is slightly decreased in size. Trace right pleural effusion. Electronically Signed   By: Jeronimo GreavesKyle  Talbot M.D.   On: 08/31/2020 18:42   CT ABDOMEN PELVIS W CONTRAST  Result Date: 09/01/2020 CLINICAL DATA:  Abdominal pain, fever EXAM: CT ABDOMEN AND PELVIS WITH CONTRAST TECHNIQUE: Multidetector CT imaging of the abdomen and pelvis was performed using the standard protocol following bolus administration of intravenous contrast. CONTRAST:  100mL OMNIPAQUE IOHEXOL 300 MG/ML  SOLN COMPARISON:  08/16/2020, 08/13/2020 FINDINGS: Lower chest: Subpleural nodule  within the visualized right lung base and atelectasis within the left lung base are unchanged. Previously noted pleural effusions have resolved. The visualized heart and pericardium are unremarkable. Hepatobiliary: No focal liver abnormality is seen. No gallstones, gallbladder wall thickening, or biliary dilatation. Pancreas: Unremarkable Spleen: Unremarkable Adrenals/Urinary Tract: The adrenal glands are unremarkable. The kidneys are normal in size and position. The right kidney is unremarkable. Percutaneous nephrostomy is seen within the left kidney, unchanged with its retaining loop formed within the left renal pelvis. Percutaneous pigtail drainage catheter is seen within the a posterior perinephric fluid collection, but has been partially withdrawn with its retaining loop now just within the loculated collection. The collection is slightly improved in overall size, now measuring 3.9 x 8.6 by 13.2 cm in greatest dimension on axial image # 29 and sagittal reformat # 105. Punctate foci of gas are again seen within the collection, though this appears reduced when compared to prior examination. A second collection is seen within the posterior left pararenal space. This U shaped collection measures roughly 11.2 x 9.7 cm at its base in the region of the percutaneous drainage catheter, axial image # 52. Superolaterally, this measures 4.2 x 11.1 cm at axial image # 47. These appear unchanged from prior examination, though appear improved when compared to remote prior examination of 08/08/2020. Pigtail drainage catheter within the basilar component appears unchanged in position. Stomach/Bowel: Stomach, small bowel, and large bowel are unremarkable. Appendix normal. No free intraperitoneal gas or fluid. Vascular/Lymphatic: The abdominal vasculature is unremarkable. Shotty left periaortic lymphadenopathy likely represents reactive adenopathy. No frankly pathologic adenopathy within the abdomen and pelvis. Reproductive:  Uterus and bilateral adnexa are unremarkable. Other: No abdominal wall hernia.  Rectum unremarkable. Musculoskeletal: No acute bone abnormality. No lytic or blastic bone lesions are seen. IMPRESSION: Left perinephric abscess has slightly decreased in size when compared to prior examination. Percutaneous drainage catheter is seen within the collection, though has been partially withdrawn when compared to prior exam. Left posterior pararenal U shaped abscess appears unchanged when compared to prior examination. Percutaneous drainage catheter appears appropriately position within the dependent component of the collection, unchanged from prior examination. Left  percutaneous nephrostomy in appropriate position. No hydronephrosis. Resolved bilateral pleural effusions. Stable right subpleural pulmonary nodule, likely inflammatory in nature. Electronically Signed   By: Helyn Numbers MD   On: 09/01/2020 01:58        Scheduled Meds: . Chlorhexidine Gluconate Cloth  6 each Topical Daily  . enoxaparin (LOVENOX) injection  40 mg Subcutaneous Q24H  . insulin aspart  0-15 Units Subcutaneous TID WC  . insulin glargine  8 Units Subcutaneous BID   Continuous Infusions: . ceFEPime (MAXIPIME) IV 2 g (09/02/20 1328)     LOS: 0 days    Time spent:    Erick Blinks, MD [facility]   If 7PM-7AM, please contact night-coverage [email or web address]  09/02/2020, 6:13 PM

## 2020-09-03 DIAGNOSIS — N12 Tubulo-interstitial nephritis, not specified as acute or chronic: Secondary | ICD-10-CM

## 2020-09-03 DIAGNOSIS — N151 Renal and perinephric abscess: Secondary | ICD-10-CM

## 2020-09-03 LAB — COMPREHENSIVE METABOLIC PANEL
ALT: 8 U/L (ref 0–44)
AST: 10 U/L — ABNORMAL LOW (ref 15–41)
Albumin: 2.3 g/dL — ABNORMAL LOW (ref 3.5–5.0)
Alkaline Phosphatase: 162 U/L — ABNORMAL HIGH (ref 38–126)
Anion gap: 10 (ref 5–15)
BUN: 6 mg/dL (ref 6–20)
CO2: 26 mmol/L (ref 22–32)
Calcium: 8.3 mg/dL — ABNORMAL LOW (ref 8.9–10.3)
Chloride: 103 mmol/L (ref 98–111)
Creatinine, Ser: 0.54 mg/dL (ref 0.44–1.00)
GFR, Estimated: >60 mL/min (ref >60)
Glucose, Bld: 143 mg/dL — ABNORMAL HIGH (ref 70–99)
Potassium: 2.9 mmol/L — ABNORMAL LOW (ref 3.5–5.1)
Sodium: 139 mmol/L (ref 135–145)
Total Bilirubin: 0.4 mg/dL (ref 0.3–1.2)
Total Protein: 5.9 g/dL — ABNORMAL LOW (ref 6.5–8.1)

## 2020-09-03 LAB — CBC
HCT: 23.2 % — ABNORMAL LOW (ref 36.0–46.0)
Hemoglobin: 7.2 g/dL — ABNORMAL LOW (ref 12.0–15.0)
MCH: 29.3 pg (ref 26.0–34.0)
MCHC: 31 g/dL (ref 30.0–36.0)
MCV: 94.3 fL (ref 80.0–100.0)
Platelets: 352 10*3/uL (ref 150–400)
RBC: 2.46 MIL/uL — ABNORMAL LOW (ref 3.87–5.11)
RDW: 14.2 % (ref 11.5–15.5)
WBC: 7.9 10*3/uL (ref 4.0–10.5)
nRBC: 0 % (ref 0.0–0.2)

## 2020-09-03 LAB — GLUCOSE, CAPILLARY
Glucose-Capillary: 124 mg/dL — ABNORMAL HIGH (ref 70–99)
Glucose-Capillary: 119 mg/dL — ABNORMAL HIGH (ref 70–99)
Glucose-Capillary: 100 mg/dL — ABNORMAL HIGH (ref 70–99)
Glucose-Capillary: 139 mg/dL — ABNORMAL HIGH (ref 70–99)

## 2020-09-03 MED ORDER — POTASSIUM CHLORIDE CRYS ER 20 MEQ PO TBCR
40.0000 meq | EXTENDED_RELEASE_TABLET | Freq: Once | ORAL | Status: AC
  Administered 2020-09-03: 40 meq via ORAL
  Filled 2020-09-03: qty 2

## 2020-09-03 MED ORDER — ENOXAPARIN SODIUM 40 MG/0.4ML ~~LOC~~ SOLN: 40.0000 mg | SUBCUTANEOUS | Status: DC

## 2020-09-03 MED ORDER — ENOXAPARIN SODIUM 40 MG/0.4ML ~~LOC~~ SOLN
40.0000 mg | SUBCUTANEOUS | Status: DC
Start: 2020-09-03 — End: 2020-09-05
  Administered 2020-09-03: 40 mg via SUBCUTANEOUS
  Filled 2020-09-03: qty 0.4

## 2020-09-03 NOTE — Progress Notes (Signed)
Subjective: Patient reports minimal output from drains. 575cc charted for drain output but patient on has 50cc in bag. No pain  Objective: Vital signs in last 24 hours: Temp:  [97.7 F (36.5 C)-99.5 F (37.5 C)] 98.4 F (36.9 C) (01/26 1416) Pulse Rate:  [46-95] 95 (01/26 1416) Resp:  [18-20] 19 (01/26 1416) BP: (129-147)/(77-91) 135/77 (01/26 1416) SpO2:  [97 %-98 %] 98 % (01/26 1416)  Intake/Output from previous day: 01/25 0701 - 01/26 0700 In: 1464.5 [P.O.:960; IV Piggyback:499.5] Out: 555 [Urine:120; Drains:435] Intake/Output this shift: Total I/O In: 240 [P.O.:240] Out: -   Physical Exam:  General:alert, cooperative and appears stated age GI: soft, non tender, normal bowel sounds, no palpable masses, no organomegaly, no inguinal hernia Female genitalia: not done Extremities: extremities normal, atraumatic, no cyanosis or edema  Lab Results: Recent Labs    08/31/20 1916 09/01/20 0744 09/03/20 0426  HGB 8.5* 7.4* 7.2*  HCT 26.7* 24.4* 23.2*   BMET Recent Labs    09/01/20 0744 09/03/20 0426  NA 139 139  K 3.5 2.9*  CL 105 103  CO2 25 26  GLUCOSE 155* 143*  BUN 8 6  CREATININE 0.63 0.54  CALCIUM 8.6* 8.3*   Recent Labs    08/31/20 2315  INR 1.2   No results for input(s): LABURIN in the last 72 hours. Results for orders placed or performed during the hospital encounter of 08/31/20  Blood Culture (routine x 2)     Status: None (Preliminary result)   Collection Time: 08/31/20 11:15 PM   Specimen: BLOOD LEFT FOREARM  Result Value Ref Range Status   Specimen Description BLOOD LEFT FOREARM  Final   Special Requests   Final    BOTTLES DRAWN AEROBIC AND ANAEROBIC Blood Culture adequate volume   Culture   Final    NO GROWTH 3 DAYS Performed at [facility], [address] [place], [place] [address]    Report Status PENDING  Incomplete  SARS Coronavirus 2 by RT PCR (hospital order, performed in [facility] hospital lab) Nasopharyngeal Nasopharyngeal  Swab     Status: None   Collection Time: 08/31/20 11:37 PM   Specimen: Nasopharyngeal Swab  Result Value Ref Range Status   SARS Coronavirus 2 NEGATIVE NEGATIVE Final    Comment: (NOTE) SARS-CoV-2 target nucleic acids are NOT DETECTED.  The SARS-CoV-2 RNA is generally detectable in upper and lower respiratory specimens during the acute phase of infection. The lowest concentration of SARS-CoV-2 viral copies this assay can detect is 250 copies / mL. A negative result does not preclude SARS-CoV-2 infection and should not be used as the sole basis for treatment or other patient management decisions.  A negative result may occur with improper specimen collection / handling, submission of specimen other than nasopharyngeal swab, presence of viral mutation(s) within the areas targeted by this assay, and inadequate number of viral copies (<250 copies / mL). A negative result must be combined with clinical observations, patient history, and epidemiological information.  Fact Sheet for Patients:   BoilerBrush.com.cy  Fact Sheet for Healthcare Providers: https://pope.com/  This test is not yet approved or  cleared by the [place] FDA and has been authorized for detection and/or diagnosis of SARS-CoV-2 by FDA under an Emergency Use Authorization (EUA).  This EUA will remain in effect (meaning this test can be used) for the duration of the COVID-19 declaration under Section 564(b)(1) of the Act, 21 U.S.C. section 360bbb-3(b)(1), unless the authorization is terminated or revoked sooner.  Performed at  [facility], [address], [place], [place] [address]   Urine culture     Status: Abnormal   Collection Time: 09/01/20 12:05 AM   Specimen: In/Out Cath Urine  Result Value Ref Range Status   Specimen Description   Final    IN/OUT CATH URINE Performed at [facility], [address], [place], [place] [address]    Special Requests   Final     NONE Performed at [facility], [address] [place], [place] [address]    Culture >=100,000 COLONIES/mL YEAST (A)  Final   Report Status 09/02/2020 FINAL  Final  Blood Culture (routine x 2)     Status: None (Preliminary result)   Collection Time: 09/01/20  1:10 AM   Specimen: BLOOD LEFT WRIST  Result Value Ref Range Status   Specimen Description BLOOD LEFT WRIST  Final   Special Requests   Final    BOTTLES DRAWN AEROBIC AND ANAEROBIC Blood Culture adequate volume   Culture   Final    NO GROWTH 2 DAYS Performed at [facility], [address], [place], [place] [address]    Report Status PENDING  Incomplete  Culture, blood (x 2)     Status: None (Preliminary result)   Collection Time: 09/01/20  7:43 AM   Specimen: BLOOD LEFT WRIST  Result Value Ref Range Status   Specimen Description BLOOD LEFT WRIST  Final   Special Requests   Final    BOTTLES DRAWN AEROBIC AND ANAEROBIC Blood Culture adequate volume   Culture   Final    NO GROWTH 2 DAYS Performed at [facility], [address] [place], [place] [address]    Report Status PENDING  Incomplete  Culture, blood (x 2)     Status: None (Preliminary result)   Collection Time: 09/01/20  9:14 AM   Specimen: BLOOD LEFT HAND  Result Value Ref Range Status   Specimen Description BLOOD LEFT HAND  Final   Special Requests   Final    BOTTLES DRAWN AEROBIC ONLY Blood Culture results may not be optimal due to an inadequate volume of blood received in culture bottles   Culture   Final    NO GROWTH 2 DAYS Performed at [facility], [address] [place], [place] [address]    Report Status PENDING  Incomplete    Studies/Results: No results found.  Assessment/Plan: 59yo with left perinephric abscess  1. Patient scheduled for drain placement tomorrow morning. We will assess oputput and schedule for nephrectomy as an outpatient.   LOS: 1 day   Wilkie Aye 09/03/2020, 4:12 PM

## 2020-09-03 NOTE — Progress Notes (Addendum)
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  Subjective:  12/24: L PCN and L pelvic abscess drain placed in IR (Drain #1 to JP) 08/14/20: L perinephric abscess drain placed in IR (Drain #2) Has been at home since 1/10 To ED 1/24:  Fever; chills; N/V Admitted with Leukocytosis and fever and hypotension  CT 1/24: IMPRESSION: Left perinephric abscess has slightly decreased in size when compared to prior examination. Percutaneous drainage catheter is seen within the collection, though has been partially withdrawn when compared to prior exam.  Left posterior pararenal U shaped abscess appears unchanged when compared to prior examination. Percutaneous drainage catheter appears appropriately position within the dependent component of the collection, unchanged from prior examination. Left percutaneous nephrostomy in appropriate position. No Hydronephrosis.  Request made from [facility] to evaluate L perinephric drain catheter OP is little to none--- pt states before admission-- was putting out 30-50 cc daily Other drains seems to be working well  Wbc 7.9 this am Afeb  Imaging reviewed with Dr Laurence Ferrari Will have pt come to [facility] 1/27 for evaluation and possible drain exchange   Allergies: Codeine  Medications: Prior to Admission medications   Medication Sig Start Date End Date Taking? Authorizing Provider  acetaminophen (TYLENOL) 500 MG tablet Take 1,000 mg by mouth every 6 (six) hours as needed for mild pain.   Yes [provider]  ceFAZolin (ANCEF) IVPB Inject 2 g into the vein every 8 (eight) hours. Indication:  Emphysematous pyelonephritis, intra-abdominal abscess First Dose: Yes Last Day of Therapy:  09/25/2020 Labs - Once weekly:  CBC/D and BMP, Labs - Every other week:  ESR and CRP Method of administration:  IV Push Method of administration may be changed at the discretion of home infusion pharmacist based upon assessment of the patient and/or caregiver's ability to self-administer the medication ordered. 08/16/20 09/26/20 Yes Florencia Reasons, MD  CINNAMON PO Take 1 tablet by mouth daily.   Yes [provider]  blood glucose meter kit and supplies KIT Dispense based on patient and insurance preference. Use up to four times daily as directed. (FOR ICD-9 250.00, 250.01). 08/18/20   Shelly Coss, MD  insulin aspart (NOVOLOG FLEXPEN) 100 UNIT/ML FlexPen Inject 0-15 Units into the skin 3 (three) times daily with meals. 08/18/20   Shelly Coss, MD  insulin glargine (LANTUS SOLOSTAR) 100 UNIT/ML Solostar Pen Inject 8 Units into the skin 2 (two) times daily. 08/18/20   Shelly Coss, MD  Insulin Pen Needle (PEN NEEDLES 3/16") 31G X 5 MM MISC 1 each by Does not apply route 3 (three) times daily. 08/18/20   Shelly Coss, MD     Vital Signs: BP (!) 147/91 (BP Location: Left Arm)   Pulse 86   Temp 98.3 F (36.8 C) (Oral)   Resp 18   Ht $R'5\' 1"'ez$  (1.549 m)   Wt 164 lb (74.4 kg)   LMP  (LMP Unknown)   SpO2 97%   BMI 30.99 kg/m   Physical Exam Vitals reviewed.  HENT:     Mouth/Throat:     Mouth: Mucous membranes are moist.  Cardiovascular:     Rate and Rhythm: Normal rate and regular rhythm.  Pulmonary:     Effort: Pulmonary effort is normal.  Musculoskeletal:        General: Normal range of motion.     Comments:    Skin:  General: Skin is warm.     Comments: Sites of all drains are clean and dry NT No bleeding No signs of infection  #2 drain to bag (Left perinephric abscess)--- does have some creamy brown OP at site OP dark golden - 15-20 cc last few days Flushes easily-- difficult to aspirate  #1 drain to JP-- output 400 cc daily Creamy brown Flushes easily-- difficult aspiration  L PCN has great OP-- yellow urine  Neurological:     Mental Status: She is alert and oriented to  person, place, and time.  Psychiatric:        Behavior: Behavior normal.     Imaging: DG Chest 2 View  Result Date: 08/31/2020 CLINICAL DATA:  Shortness of breath. Possible PICC line infection. On antibiotics. EXAM: CHEST - 2 VIEW COMPARISON:  08/07/2020 and CT of 08/08/2020. FINDINGS: Left upper quadrant percutaneous drains. A right-sided PICC line terminates at the low SVC. Midline trachea. Mild cardiomegaly. Trace right pleural effusion. No pneumothorax. The dominant right upper lobe septic embolus measuring 2.4 cm today versus 3.1 cm on 08/07/2020 (when remeasured). The underlying pattern of diffuse reticulonodular opacification is difficult to directly compare to the prior AP radiograph, but may be slightly improved. No lobar consolidation. Mild left base subsegmental atelectasis. IMPRESSION: Reticulonodular opacification throughout both lungs, consistent with septic emboli when compared to 08/08/2020 CT. This may be slightly improved compared to a on a AP radiographs most recent 08/07/2020. The right upper lobe dominant lesion is slightly decreased in size. Trace right pleural effusion. Electronically Signed   By: Abigail Miyamoto M.D.   On: 08/31/2020 18:42   CT ABDOMEN PELVIS W CONTRAST  Result Date: 09/01/2020 CLINICAL DATA:  Abdominal pain, fever EXAM: CT ABDOMEN AND PELVIS WITH CONTRAST TECHNIQUE: Multidetector CT imaging of the abdomen and pelvis was performed using the standard protocol following bolus administration of intravenous contrast. CONTRAST:  135mL OMNIPAQUE IOHEXOL 300 MG/ML  SOLN COMPARISON:  08/16/2020, 08/13/2020 FINDINGS: Lower chest: Subpleural nodule within the visualized right lung base and atelectasis within the left lung base are unchanged. Previously noted pleural effusions have resolved. The visualized heart and pericardium are unremarkable. Hepatobiliary: No focal liver abnormality is seen. No gallstones, gallbladder wall thickening, or biliary dilatation. Pancreas:  Unremarkable Spleen: Unremarkable Adrenals/Urinary Tract: The adrenal glands are unremarkable. The kidneys are normal in size and position. The right kidney is unremarkable. Percutaneous nephrostomy is seen within the left kidney, unchanged with its retaining loop formed within the left renal pelvis. Percutaneous pigtail drainage catheter is seen within the a posterior perinephric fluid collection, but has been partially withdrawn with its retaining loop now just within the loculated collection. The collection is slightly improved in overall size, now measuring 3.9 x 8.6 by 13.2 cm in greatest dimension on axial image # 29 and sagittal reformat # 105. Punctate foci of gas are again seen within the collection, though this appears reduced when compared to prior examination. A second collection is seen within the posterior left pararenal space. This U shaped collection measures roughly 11.2 x 9.7 cm at its base in the region of the percutaneous drainage catheter, axial image # 52. Superolaterally, this measures 4.2 x 11.1 cm at axial image # 47. These appear unchanged from prior examination, though appear improved when compared to remote prior examination of 08/08/2020. Pigtail drainage catheter within the basilar component appears unchanged in position. Stomach/Bowel: Stomach, small bowel, and large bowel are unremarkable. Appendix normal. No free intraperitoneal gas or fluid. Vascular/Lymphatic: The abdominal vasculature  is unremarkable. Shotty left periaortic lymphadenopathy likely represents reactive adenopathy. No frankly pathologic adenopathy within the abdomen and pelvis. Reproductive: Uterus and bilateral adnexa are unremarkable. Other: No abdominal wall hernia.  Rectum unremarkable. Musculoskeletal: No acute bone abnormality. No lytic or blastic bone lesions are seen. IMPRESSION: Left perinephric abscess has slightly decreased in size when compared to prior examination. Percutaneous drainage catheter is seen  within the collection, though has been partially withdrawn when compared to prior exam. Left posterior pararenal U shaped abscess appears unchanged when compared to prior examination. Percutaneous drainage catheter appears appropriately position within the dependent component of the collection, unchanged from prior examination. Left percutaneous nephrostomy in appropriate position. No hydronephrosis. Resolved bilateral pleural effusions. Stable right subpleural pulmonary nodule, likely inflammatory in nature. Electronically Signed   By: Fidela Salisbury MD   On: 09/01/2020 01:58    Labs:  CBC: Recent Labs    08/18/20 0258 08/31/20 1916 09/01/20 0744 09/03/20 0426  WBC 7.9 21.5* 9.1 7.9  HGB 8.8* 8.5* 7.4* 7.2*  HCT 27.6* 26.7* 24.4* 23.2*  PLT 604* 385 352 352    COAGS: Recent Labs    08/14/20 0327 08/31/20 2315  INR 1.0 1.2  APTT  --  34    BMP: Recent Labs    08/31/20 2315 09/01/20 0110 09/01/20 0744 09/03/20 0426  NA 135 137 139 139  K 2.9* 3.0* 3.5 2.9*  CL 99 100 105 103  CO2 $Re'26 25 25 26  'SUP$ GLUCOSE 187* 165* 155* 143*  BUN $Re'11 10 8 6  'via$ CALCIUM 8.4* 8.7* 8.6* 8.3*  CREATININE 0.79 0.79 0.63 0.54  GFRNONAA >60 >60 >60 >60    LIVER FUNCTION TESTS: Recent Labs    08/31/20 2315 09/01/20 0110 09/01/20 0744 09/03/20 0426  BILITOT 0.6 0.6 0.7 0.4  AST $Re'15 15 15 'lyp$ 10*  ALT $Re'9 9 8 8  'XaO$ ALKPHOS 205* 207* 182* 162*  PROT 6.4* 6.5 6.0* 5.9*  ALBUMIN 2.5* 2.7* 2.5* 2.3*    Assessment and Plan:  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 L  PCN in place-- UOP great- clear yellow urine Drain #1 - pelvic abscess - to JP; flushes easily-- difficult aspiration OP milky yellow; 400-500 cc daily Drain #2 - left perinephric abscess drain to bag; OP minimal- dark golden brown; recent drop in OP maybe 15-20 daily; flushes easily- difficult aspiration Scheduled for IR evaluation and possible exchange of Drain #2 and possibly Drain #1 -  tomorrow. Pt is aware of procedure Agreeable to proceed Consent signed and in chart   Electronically Signed: Lavonia Drafts, PA-C 09/03/2020, 9:42 AM   I spent a total of 25 Minutes at the the patient's bedside AND on the patient's hospital floor or unit, greater than 50% of which was counseling/coordinating care for left abscess drain (s) evaluation

## 2020-09-03 NOTE — Progress Notes (Signed)
PROGRESS NOTE  Tammie [name] HYQ:[id number] DOB: [birth year] DOA: 08/31/2020 PCP: Patient, No Pcp Per  Brief History:  60 year old female with a history of diabetes mellitus type 2, hypertension, and emphysematous pyelonephritis presenting with fevers and associated nausea and vomiting.  She had noticed decreased output from one of her abscess drains. She presented to the emergency room where she was noted to be hypotensive and had a significant leukocytosis.  The patient has a rather complicated history after hospitalization from 07/31/2020 to 08/18/2020.  During that hospitalization, the patient was treated for sepsis secondary to emphysematous pyelonephritis and a pelvic abscess and perinephric abscess.  Cultures ultimately grew pansensitive E. coli.  During that hospitalization, the patient required placement of a left percutaneous nephrostomy tube as well as to drains for a pelvic abscess as well as a perirenal abscess.  Her hospitalization was also prolonged and complicated secondary to severe hyponatremia for which she required hypertonic saline.  Ultimately, a PICC line was placed on 08/09/2020, and she was discharged home with continuous infusion cefazolin with a previous in date of 09/25/2020.  She had follow-up with infectious disease on 09/04/2020.  Assessment/Plan: Left perinephric abscess/Pyelonephritis -Recent admission for the same, she was discharged on 1/10 with percutaneous drain placement for abscess and plans for intravenous Ancef until 2/17 -Patient readmitted with chills, leukocytosis, decreased output from drain -1/24 CT abd--left perinephric abscess slightly decreased in size compared to prior; L-PCN in appropriate position without hydronephrosis; perinephric drain within the collection though partially withdrawn; left posterior pararenal U-shaped collection unchanged with drain in appropriate position -Patient seen and discussed with urology, Dr. Ronne Binning who recommended  patient's abscess drain(d) be replaced -reconsulted interventional radiology-->plans for drain exchange 09/04/20 -Continue IV antibiotics, currently on cefepime -Cultures have not shown any growth -Anticipate transitioning the patient back to cefazolin upon discharge to complete outpatient course  History of hydronephrosis -Left percutaneous nephrostomy tube in place that is draining -Renal function preserved  Diabetes type 2, uncontrolled with hyperglycemia -Blood sugars currently stable/controlled -Continue on Lantus and sliding scale insulin -08/01/20 A1C--12.2  Hypokalemia/Hypomagnesemia -Replaced  Anemia of chronic disease -Suspect this is secondary to her infectious/inflammatory process -Hemoglobin checked yesterday was 7.4 -Will be repeated and plan to transfuse for hemoglobin <7 -check iron -check B12 -folate  Funguria -does not appear clinically significant -replace drains -pt clinically improving without anti-fungal tx      Status is: Inpatient  Remains inpatient appropriate because:IV treatments appropriate due to intensity of illness or inability to take PO   Dispo: The patient is from: Home              Anticipated d/c is to: Home              Anticipated d/c date is: 2 days              Patient currently is not medically stable to d/c.   Difficult to place patient No        Family Communication:  Spouse updated at bedside 1/26  Consultants:  IR, urology  Code Status:  FULL   DVT Prophylaxis:  [place] Lovenox   Procedures: As Listed in Progress Note Above  Antibiotics: Cefepime 1/23>>     Subjective: Patient denies fevers, chills, headache, chest pain, dyspnea, nausea, vomiting, diarrhea, abdominal pain, dysuria, hematuria, hematochezia, and melena.   Objective: Vitals:   09/02/20 1357 09/02/20 2205 09/03/20 0520 09/03/20 1007  BP: (!) 146/75 133/77 (!) 147/91 129/89  Pulse: 98 (!) 46 86 95  Resp: 18 20 18 19   Temp: 98.6 F  (37 C) 99.5 F (37.5 C) 98.3 F (36.8 C) 97.7 F (36.5 C)  TempSrc: Oral Oral Oral Oral  SpO2: 96% 97% 97% 98%  Weight:      Height:        Intake/Output Summary (Last 24 hours) at 09/03/2020 1142 Last data filed at 09/03/2020 0900 Gross per 24 hour  Intake 1224.46 ml  Output 555 ml  Net 669.46 ml   Weight change:  Exam:   General:  Pt is alert, follows commands appropriately, not in acute distress  HEENT: No icterus, No thrush, No neck mass, [place]/AT  Cardiovascular: RRR, S1/S2, no rubs, no gallops  Respiratory: CTA bilaterally, no wheezing, no crackles, no rhonchi  Abdomen: Soft/+BS, Mild left sided tender, non distended, no guarding  Extremities: No edema, No lymphangitis, No petechiae, No rashes, no synovitis   Data Reviewed: I have personally reviewed following labs and imaging studies Basic Metabolic Panel: Recent Labs  Lab 08/31/20 2315 09/01/20 0110 09/01/20 0744 09/03/20 0426  NA 135 137 139 139  K 2.9* 3.0* 3.5 2.9*  CL 99 100 105 103  CO2 26 25 25 26   GLUCOSE 187* 165* 155* 143*  BUN 11 10 8 6   CREATININE 0.79 0.79 0.63 0.54  CALCIUM 8.4* 8.7* 8.6* 8.3*  MG  --   --  1.5*  --    Liver Function Tests: Recent Labs  Lab 08/31/20 2315 09/01/20 0110 09/01/20 0744 09/03/20 0426  AST 15 15 15  10*  ALT 9 9 8 8   ALKPHOS 205* 207* 182* 162*  BILITOT 0.6 0.6 0.7 0.4  PROT 6.4* 6.5 6.0* 5.9*  ALBUMIN 2.5* 2.7* 2.5* 2.3*   No results for input(s): LIPASE, AMYLASE in the last 168 hours. No results for input(s): AMMONIA in the last 168 hours. Coagulation Profile: Recent Labs  Lab 08/31/20 2315  INR 1.2   CBC: Recent Labs  Lab 08/31/20 1916 09/01/20 0744 09/03/20 0426  WBC 21.5* 9.1 7.9  NEUTROABS 19.7* 6.8  --   HGB 8.5* 7.4* 7.2*  HCT 26.7* 24.4* 23.2*  MCV 93.7 95.3 94.3  PLT 385 352 352   Cardiac Enzymes: No results for input(s): CKTOTAL, CKMB, CKMBINDEX, TROPONINI in the last 168 hours. BNP: Invalid input(s): POCBNP CBG: Recent  Labs  Lab 09/02/20 0732 09/02/20 1121 09/02/20 1651 09/02/20 2214 09/03/20 0830  GLUCAP 126* 144* 167* 173* 124*   HbA1C: No results for input(s): HGBA1C in the last 72 hours. Urine analysis:    Component Value Date/Time   COLORURINE AMBER (A) 09/01/2020 0005   APPEARANCEUR TURBID (A) 09/01/2020 0005   LABSPEC 1.006 09/01/2020 0005   PHURINE 6.0 09/01/2020 0005   GLUCOSEU 50 (A) 09/01/2020 0005   HGBUR MODERATE (A) 09/01/2020 0005   BILIRUBINUR NEGATIVE 09/01/2020 0005   KETONESUR NEGATIVE 09/01/2020 0005   PROTEINUR 100 (A) 09/01/2020 0005   NITRITE NEGATIVE 09/01/2020 0005   LEUKOCYTESUR MODERATE (A) 09/01/2020 0005   Sepsis Labs: @LABRCNTIP (procalcitonin:4,lacticidven:4) ) Recent Results (from the past 240 hour(s))  Blood Culture (routine x 2)     Status: None (Preliminary result)   Collection Time: 08/31/20 11:15 PM   Specimen: BLOOD LEFT FOREARM  Result Value Ref Range Status   Specimen Description BLOOD LEFT FOREARM  Final   Special Requests   Final    BOTTLES DRAWN AEROBIC AND ANAEROBIC Blood Culture adequate volume   Culture   Final    NO  GROWTH 3 DAYS Performed at [facility], [address] [place], [place] [address]    Report Status PENDING  Incomplete  SARS Coronavirus 2 by RT PCR (hospital order, performed [facility]) Nasopharyngeal Nasopharyngeal Swab     Status: None   Collection Time: 08/31/20 11:37 PM   Specimen: Nasopharyngeal Swab  Result Value Ref Range Status   SARS Coronavirus 2 NEGATIVE NEGATIVE Final    Comment: (NOTE) SARS-CoV-2 target nucleic acids are NOT DETECTED.  The SARS-CoV-2 RNA is generally detectable in upper and lower respiratory specimens during the acute phase of infection. The lowest concentration of SARS-CoV-2 viral copies this assay can detect is 250 copies / mL. A negative result does not preclude SARS-CoV-2 infection and should not be used as the sole basis for treatment or other patient management  decisions.  A negative result may occur with improper specimen collection / handling, submission of specimen other than nasopharyngeal swab, presence of viral mutation(s) within the areas targeted by this assay, and inadequate number of viral copies (<250 copies / mL). A negative result must be combined with clinical observations, patient history, and epidemiological information.  Fact Sheet for Patients:   BoilerBrush.com.cyhttps://www.fda.gov/media/136312/download  Fact Sheet for Healthcare Providers: https://pope.com/https://www.fda.gov/media/136313/download  This test is not yet approved or  cleared by the [place] FDA and has been authorized for detection and/or diagnosis of SARS-CoV-2 by FDA under an Emergency Use Authorization (EUA).  This EUA will remain in effect (meaning this test can be used) for the duration of the COVID-19 declaration under Section 564(b)(1) of the Act, 21 U.S.C. section 360bbb-3(b)(1), unless the authorization is terminated or revoked sooner.  Performed at [facility], [address] [place], [place] [address]   Urine culture     Status: Abnormal   Collection Time: 09/01/20 12:05 AM   Specimen: In/Out Cath Urine  Result Value Ref Range Status   Specimen Description   Final    IN/OUT CATH URINE Performed at [facility], [address] [place], [place] [address]    Special Requests   Final    NONE Performed at [facility], [address] [place], [place] [address]    Culture >=100,000 COLONIES/mL YEAST (A)  Final   Report Status 09/02/2020 FINAL  Final  Blood Culture (routine x 2)     Status: None (Preliminary result)   Collection Time: 09/01/20  1:10 AM   Specimen: BLOOD LEFT WRIST  Result Value Ref Range Status   Specimen Description BLOOD LEFT WRIST  Final   Special Requests   Final    BOTTLES DRAWN AEROBIC AND ANAEROBIC Blood Culture adequate volume   Culture   Final    NO GROWTH 2 DAYS Performed at [facility], [address] [place], [place] [address]     Report Status PENDING  Incomplete  Culture, blood (x 2)     Status: None (Preliminary result)   Collection Time: 09/01/20  7:43 AM   Specimen: BLOOD LEFT WRIST  Result Value Ref Range Status   Specimen Description BLOOD LEFT WRIST  Final   Special Requests   Final    BOTTLES DRAWN AEROBIC AND ANAEROBIC Blood Culture adequate volume   Culture   Final    NO GROWTH 2 DAYS Performed at [facility], [address] [place], [place] [address]    Report Status PENDING  Incomplete  Culture, blood (x 2)     Status: None (Preliminary result)   Collection Time: 09/01/20  9:14 AM  Specimen: BLOOD LEFT HAND  Result Value Ref Range Status   Specimen Description BLOOD LEFT HAND  Final   Special Requests   Final    BOTTLES DRAWN AEROBIC ONLY Blood Culture results may not be optimal due to an inadequate volume of blood received in culture bottles   Culture   Final    NO GROWTH 2 DAYS Performed at [facility], [address] [place], [place] [address]    Report Status PENDING  Incomplete     Scheduled Meds: . Chlorhexidine Gluconate Cloth  6 each Topical Daily  . enoxaparin (LOVENOX) injection  40 mg Subcutaneous Q24H  . insulin aspart  0-15 Units Subcutaneous TID WC  . insulin glargine  8 Units Subcutaneous BID   Continuous Infusions: . ceFEPime (MAXIPIME) IV 2 g (09/03/20 6962)    Procedures/Studies: CT ABDOMEN PELVIS W WO CONTRAST  Result Date: 08/13/2020 CLINICAL DATA:  Complicated pyelonephritis.  Abscess. EXAM: CT ABDOMEN AND PELVIS WITHOUT AND WITH CONTRAST TECHNIQUE: Multidetector CT imaging of the abdomen and pelvis was performed following the standard protocol before and following the bolus administration of intravenous contrast. CONTRAST:  OMNIPAQUE IOHEXOL 300 MG/ML  SOLN COMPARISON:  08/08/2020 FINDINGS: Lower Chest: Small bilateral pleural effusions are again seen with atelectasis or consolidation in the posterior lower lobes. Multiple small pulmonary nodules are again seen  in the visualized portion of the right lower lung, without significant change. Hepatobiliary: No hepatic masses identified. Gallbladder is unremarkable. No evidence of biliary ductal dilatation. Pancreas:  No mass or inflammatory changes. Spleen: Within normal limits in size and appearance. Adrenals/Urinary Tract: A left percutaneous nephrostomy tube is seen in appropriate position and there is no evidence of hydronephrosis. Diffuse left renal swelling is again seen with multiple small irregular fluid density collections throughout the renal parenchyma, and a perinephric fluid and gas collection is again seen along the lateral and posterior margins of the kidney. This measures 9.4 x 4.3 cm on image 29/7, increased from 7.4 x 2.5 cm when remeasured in same planes on prior exam. A 2nd percutaneous drainage catheter is seen in the left lower quadrant along the anterior aspect of the left psoas muscle. Left retroperitoneal fluid collection in this region measures 8.7 x 4.4 cm on image 54/2, without significant change. A 3rd fluid and gas collection is seen in the left pararenal space which measures 6.7 x 2.7 cm on image 46/7, without significant change in size since prior exam. Foley catheter is noted within the urinary bladder. Stomach/Bowel: No evidence of obstruction, inflammatory process or abnormal fluid collections. Vascular/Lymphatic: No pathologically enlarged lymph nodes. No abdominal aortic aneurysm. Reproductive:  No mass or other significant abnormality. Other:  None. Musculoskeletal:  No suspicious bone lesions identified. IMPRESSION: Left percutaneous nephrostomy tube in appropriate position. No evidence of hydronephrosis. Stable diffuse left renal parenchymal swelling with multiple small internal fluid density collections. Mild increase in size of left perinephric fluid and gas collection. No significant change in size of fluid and gas collections along the anterior aspect of left psoas muscle, and in the  left lateral pararenal space. Stable small bilateral pleural effusions and posterior lower lobe atelectasis or consolidation. Stable small right lower lung pulmonary nodules. Electronically Signed   By: Danae Orleans M.D.   On: 08/13/2020 08:27   DG Chest 2 View  Result Date: 08/31/2020 CLINICAL DATA:  Shortness of breath. Possible PICC line infection. On antibiotics. EXAM: CHEST - 2 VIEW COMPARISON:  08/07/2020 and CT of 08/08/2020. FINDINGS: Left upper  quadrant percutaneous drains. A right-sided PICC line terminates at the low SVC. Midline trachea. Mild cardiomegaly. Trace right pleural effusion. No pneumothorax. The dominant right upper lobe septic embolus measuring 2.4 cm today versus 3.1 cm on 08/07/2020 (when remeasured). The underlying pattern of diffuse reticulonodular opacification is difficult to directly compare to the prior AP radiograph, but may be slightly improved. No lobar consolidation. Mild left base subsegmental atelectasis. IMPRESSION: Reticulonodular opacification throughout both lungs, consistent with septic emboli when compared to 08/08/2020 CT. This may be slightly improved compared to a on a AP radiographs most recent 08/07/2020. The right upper lobe dominant lesion is slightly decreased in size. Trace right pleural effusion. Electronically Signed   By: Jeronimo Greaves M.D.   On: 08/31/2020 18:42   DG Chest 2 View  Result Date: 08/07/2020 CLINICAL DATA:  Sepsis.  Left pyelonephritis. EXAM: CHEST - 2 VIEW COMPARISON:  08/04/2020 FINDINGS: Stable cardiomegaly. Small left pleural effusion and left basilar atelectasis or infiltrate show no significant change. Probable small layering right pleural effusion also noted. Increased airspace opacity is seen in the right upper lobe since prior study. IMPRESSION: Increased right upper lobe airspace opacity. Stable small left pleural effusion, and left basilar atelectasis versus infiltrate. Probable small layering right pleural effusion.  Electronically Signed   By: Danae Orleans M.D.   On: 08/07/2020 12:54   CT ABDOMEN PELVIS W CONTRAST  Result Date: 09/01/2020 CLINICAL DATA:  Abdominal pain, fever EXAM: CT ABDOMEN AND PELVIS WITH CONTRAST TECHNIQUE: Multidetector CT imaging of the abdomen and pelvis was performed using the standard protocol following bolus administration of intravenous contrast. CONTRAST:  OMNIPAQUE IOHEXOL 300 MG/ML  SOLN COMPARISON:  08/16/2020, 08/13/2020 FINDINGS: Lower chest: Subpleural nodule within the visualized right lung base and atelectasis within the left lung base are unchanged. Previously noted pleural effusions have resolved. The visualized heart and pericardium are unremarkable. Hepatobiliary: No focal liver abnormality is seen. No gallstones, gallbladder wall thickening, or biliary dilatation. Pancreas: Unremarkable Spleen: Unremarkable Adrenals/Urinary Tract: The adrenal glands are unremarkable. The kidneys are normal in size and position. The right kidney is unremarkable. Percutaneous nephrostomy is seen within the left kidney, unchanged with its retaining loop formed within the left renal pelvis. Percutaneous pigtail drainage catheter is seen within the a posterior perinephric fluid collection, but has been partially withdrawn with its retaining loop now just within the loculated collection. The collection is slightly improved in overall size, now measuring 3.9 x 8.6 by 13.2 cm in greatest dimension on axial image # 29 and sagittal reformat # 105. Punctate foci of gas are again seen within the collection, though this appears reduced when compared to prior examination. A second collection is seen within the posterior left pararenal space. This U shaped collection measures roughly 11.2 x 9.7 cm at its base in the region of the percutaneous drainage catheter, axial image # 52. Superolaterally, this measures 4.2 x 11.1 cm at axial image # 47. These appear unchanged from prior examination, though appear  improved when compared to remote prior examination of 08/08/2020. Pigtail drainage catheter within the basilar component appears unchanged in position. Stomach/Bowel: Stomach, small bowel, and large bowel are unremarkable. Appendix normal. No free intraperitoneal gas or fluid. Vascular/Lymphatic: The abdominal vasculature is unremarkable. Shotty left periaortic lymphadenopathy likely represents reactive adenopathy. No frankly pathologic adenopathy within the abdomen and pelvis. Reproductive: Uterus and bilateral adnexa are unremarkable. Other: No abdominal wall hernia.  Rectum unremarkable. Musculoskeletal: No acute bone abnormality. No lytic or blastic bone lesions are  seen. IMPRESSION: Left perinephric abscess has slightly decreased in size when compared to prior examination. Percutaneous drainage catheter is seen within the collection, though has been partially withdrawn when compared to prior exam. Left posterior pararenal U shaped abscess appears unchanged when compared to prior examination. Percutaneous drainage catheter appears appropriately position within the dependent component of the collection, unchanged from prior examination. Left percutaneous nephrostomy in appropriate position. No hydronephrosis. Resolved bilateral pleural effusions. Stable right subpleural pulmonary nodule, likely inflammatory in nature. Electronically Signed   By: Helyn Numbers MD   On: 09/01/2020 01:58   CT CHEST ABDOMEN PELVIS W CONTRAST  Result Date: 08/08/2020 CLINICAL DATA:  60 year old female with history of complicated pyelonephritis. EXAM: CT CHEST, ABDOMEN, AND PELVIS WITH CONTRAST TECHNIQUE: Multidetector CT imaging of the chest, abdomen and pelvis was performed following the standard protocol during bolus administration of intravenous contrast. CONTRAST:  OMNIPAQUE IOHEXOL 300 MG/ML  SOLN COMPARISON:  CT the chest, abdomen and pelvis 08/01/2020. FINDINGS: CT CHEST FINDINGS Cardiovascular: Heart size is  normal. There is no significant pericardial fluid, thickening or pericardial calcification. There is aortic atherosclerosis, as well as atherosclerosis of the great vessels of the mediastinum and the coronary arteries, including calcified atherosclerotic plaque in the left anterior descending coronary artery. Dilatation of the pulmonic trunk (3.6 cm in diameter). Mediastinum/Nodes: No pathologically enlarged in mediastinal or hilar lymph nodes. Esophagus is unremarkable in appearance. No axillary lymphadenopathy. Lungs/Pleura: Numerous pulmonary nodules and masses are again noted throughout the lungs bilaterally, many of which have central areas of cavitation. The largest of these is in the posterior aspect of the right upper lobe (axial image 54 of series 5) measuring 3.2 x 3.2 cm, with central low attenuation regions and internal cavitation, compatible with a large intrapulmonary abscess. Overall, findings are strongly suggestive of widespread septic emboli. Moderate bilateral pleural effusions lying dependently with extensive passive atelectasis in the lower lobes of the lungs bilaterally. Musculoskeletal: There are no aggressive appearing lytic or blastic lesions noted in the visualized portions of the skeleton. CT ABDOMEN PELVIS FINDINGS Hepatobiliary: No suspicious cystic or solid hepatic lesions. No intra or extrahepatic biliary ductal dilatation. Gallbladder is normal in appearance. Pancreas: No pancreatic mass. No pancreatic ductal dilatation. No peripancreatic fluid collections or inflammatory changes. Spleen: Unremarkable. Adrenals/Urinary Tract: New nephrostomy tube extends into the left renal pelvis and appears appropriately located. The left kidneys enlarged and very heterogeneous in appearance with multiple hypovascular hypoenhancing regions, compatible with residual areas of pyelonephritis and associated renal and perinephric abscesses. These extend into the perinephric space where there is a  combination of gas and fluid. Some of this tracks caudally where there is a larger collection of predominantly fluid, trace amount of gas, and some higher attenuation material adjacent to the tip of an indwelling drain (best appreciated on axial image 89 of series 2 and coronal image 74 of series 6) which measures approximately 4.3 x 2.1 x 3.6 cm. This perinephric fluid collection trucks caudally into the left hemipelvis along the left pelvic sidewall, coming into contact with the left iliacus musculature. Overall, the extent of fluid in this collection appears increased. This is well demonstrated in a relatively well-defined portion of the collection laterally which currently measures 9.5 x 3.6 cm (axial image 78 of series 2) and previously measured only 8.2 x 2.4 cm when measured in a similar fashion on 08/01/2020. Right kidney and bilateral adrenal glands are normal in appearance. No right hydroureteronephrosis. Urinary bladder is completely decompressed around an indwelling  Foley balloon catheter. Small amount of gas present in the lumen of the urinary bladder is presumably iatrogenic. Stomach/Bowel: The appearance of the stomach is normal. There is no pathologic dilatation of small bowel or colon. Normal appendix. Vascular/Lymphatic: Aortic atherosclerosis, without evidence of aneurysm or dissection in the abdominal or pelvic vasculature. Filling defect in the left renal vein best appreciated on coronal image 77 of series 6, compatible with nonocclusive left renal vein thrombus. No lymphadenopathy noted in the abdomen or pelvis. Reproductive: Uterus and ovaries are unremarkable in appearance. Other: Diffuse body wall edema. Trace volume of ascites. No pneumoperitoneum. Musculoskeletal: There are no aggressive appearing lytic or blastic lesions noted in the visualized portions of the skeleton. IMPRESSION: 1. Compared to the prior study there is been interval nephrostomy tube placement which appears appropriately  located, as well as percutaneous drainage catheter placement in the inferior aspect of the patient's extensive perinephric abscess. Unfortunately, the overall size of the abscess appears increased compared to the prior examination, and there is new high attenuation fluid within the inferior aspect of this collection, which could represent some recent hemorrhage. If there is concern for active hemorrhage, further evaluation with triple-phase CT of the abdomen and pelvis should be considered to evaluate for source of active bleeding. 2. Numerous large pulmonary nodules and masses in the lungs bilaterally, with imaging characteristics most compatible with extensive septic emboli with multifocal pulmonary abscess formation. 3. 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 This likely represents the source of emboli in this patient with evidence of septic embolization. 4. Moderate bilateral pleural effusions lying dependently with associated areas of passive atelectasis in the lower lobes of the lungs bilaterally. 5. Dilatation of the pulmonic trunk (3.6 cm in diameter), concerning for pulmonary arterial hypertension. 6. Additional incidental findings, as above. These results were called by telephone at the time of interpretation on 08/08/2020 at 5:38 pm to provider [facility], who verbally acknowledged these results. Electronically Signed   By: Trudie Reed M.D.   On: 08/08/2020 17:38   DG Abd 2 Views  Result Date: 08/08/2020 CLINICAL DATA:  Abdominal pain and decreased oral intake. EXAM: ABDOMEN - 2 VIEW COMPARISON:  Plain film of the abdomen dated 08/07/2020. FINDINGS: Mildly distended small bowel within the central abdomen with nonspecific air-fluid levels in the upper pelvis. Air is again seen within the colon. No evidence of renal or ureteral calculi. Two percutaneous pigtail catheters are again seen within the LEFT  abdomen. Dense consolidation the LEFT lung base, presumed pneumonia. IMPRESSION: 1. Mildly distended small bowel within the central abdomen with nonspecific air-fluid levels in the upper pelvis. Findings could represent ileus or partial small bowel obstruction. 2. LEFT lower lung consolidation, pneumonia versus atelectasis. Electronically Signed   By: Bary Richard M.D.   On: 08/08/2020 10:04   DG Abd 2 Views  Result Date: 08/07/2020 CLINICAL DATA:  Sepsis.  Left pyelonephritis. EXAM: ABDOMEN - 2 VIEW COMPARISON:  None. FINDINGS: Mild gaseous distention of colon is seen predominately involving the nondependent sigmoid colon. Scattered air-fluid levels are seen, however there is no evidence of dilated small bowel loops. These findings are likely due to mild ileus. No radiopaque urinary calculi identified. Two percutaneous pigtail catheters are seen in the left upper and lower quadrants. IMPRESSION: Probable mild adynamic ileus. Two percutaneous pigtail catheters in left upper and lower quadrants. Electronically Signed   By: Danae Orleans M.D.   On: 08/07/2020 12:51   CT RENAL  STONE STUDY  Result Date: 08/16/2020 CLINICAL DATA:  60 year old female with pyelonephritis and renal abscess. Follow-up CT. EXAM: CT ABDOMEN AND PELVIS WITHOUT CONTRAST TECHNIQUE: Multidetector CT imaging of the abdomen and pelvis was performed following the standard protocol without IV contrast. COMPARISON:  CT abdomen pelvis dated 08/13/2020. FINDINGS: Evaluation of this exam is limited in the absence of intravenous contrast. Lower chest: Partially visualized small bilateral pleural effusions and consolidative changes of the lung bases as seen previously. Several pulmonary nodules measure 7 mm in the right middle lobe. No intra-abdominal free air or free fluid. Hepatobiliary: No focal liver abnormality is seen. No gallstones, gallbladder wall thickening, or biliary dilatation. Pancreas: Unremarkable. No pancreatic ductal dilatation or  surrounding inflammatory changes. Spleen: Normal in size without focal abnormality. Adrenals/Urinary Tract: The adrenal glands unremarkable. Left percutaneous nephrostomy with pigtail tip in the left renal pelvis in similar position. There has been interval placement a percutaneous drainage catheter with pigtail tip in the posterior perinephric collection. Slight interval decrease in the size of the perinephric collection compared to prior CT. There is no hydronephrosis or nephrolithiasis on either side. The right kidney is unremarkable. The urinary bladder is decompressed around a Foley catheter. Stomach/Bowel: There is no bowel obstruction or active inflammation. The appendix is not visualized with certainty. No inflammatory changes identified in the right lower quadrant. Vascular/Lymphatic: Mild aortoiliac atherosclerotic disease. The IVC is unremarkable. No portal venous gas. There is no adenopathy. Reproductive: The uterus and ovaries are grossly unremarkable. Other: Mild diffuse subcutaneous edema. There has been interval placement of a percutaneous drainage catheter with tip in the fluid collection along the anterior aspect of the left psoas muscle. The collection has decreased in size now measuring approximately 4.2 x 8.0 cm (previously 4.4 x 8.7 cm). Musculoskeletal: No acute or significant osseous findings. IMPRESSION: 1. Interval placement of a percutaneous drainage catheter with tip in the posterior perinephric collection. Slight interval decrease in the size of the fluid collection compared to prior CT. 2. Decrease in the size of the collection anterior to the left psoas muscle status post percutaneous drainage catheter placement. Near 3. No hydronephrosis or nephrolithiasis. 4. No bowel obstruction. 5. Several pulmonary nodules measure 7 mm in the right middle lobe. 6. Aortic Atherosclerosis (ICD10-I70.0). Electronically Signed   By: Elgie Collard M.D.   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 EXAM: CT IMAGE GUIDED DRAINAGE BY PERCUTANEOUS CATHETER COMPARISON:  None. MEDICATIONS: The patient is currently admitted to the hospital and receiving intravenous antibiotics. The antibiotics were administered within an appropriate time frame prior to the initiation of the procedure. ANESTHESIA/SEDATION: Moderate (conscious) sedation was employed during this procedure. A total of Versed 2 mg and Fentanyl 100 mcg was administered intravenously. Moderate Sedation Time: 20 minutes. The patient's level of consciousness and vital signs were monitored continuously by radiology nursing throughout the procedure under my direct supervision. CONTRAST:  None COMPLICATIONS: None immediate. PROCEDURE: Informed written consent was obtained from the patient after a discussion of the risks, benefits and alternatives to treatment. The patient was placed in a partial right lateral decubitus position on the CT gantry and a pre procedural CT was performed re-demonstrating the known abscess/fluid collection within the left posterior perinephric space. The procedure was planned. A timeout was performed prior to the initiation of the  procedure. The left flank was prepped and draped in the usual sterile fashion. The overlying soft tissues were anesthetized with 1% lidocaine with epinephrine. Appropriate trajectory was planned with the use of a 22 gauge spinal needle. An 18 gauge trocar needle was advanced into the abscess/fluid collection and a short Amplatz super stiff wire was coiled within the collection. Appropriate positioning was confirmed with a limited CT scan. The tract was serially dilated allowing placement of a 12 [place] all-purpose drainage catheter.  Appropriate positioning was confirmed with a limited postprocedural CT scan. Approximately 100 ml of purulent fluid was aspirated. The tube was connected to a bulb suction and sutured in place. A dressing was placed. The patient tolerated the procedure well without immediate post procedural complication. IMPRESSION: Successful CT guided placement of a 58 French all purpose drain catheter into the left posterior perinephric abscess with aspiration of 100 mL of purulent fluid. Samples were sent to the laboratory as requested by the ordering clinical team. Marliss Coots, MD [facility] Radiology Electronically Signed   By: Marliss Coots MD   On: 08/14/2020 15:01   [place] EKG SITE RITE  Result Date: 08/09/2020 If Site Rite image not attached, placement could not be confirmed due to current cardiac rhythm.   Catarina Hartshorn, DO  [facility]  If 7PM-7AM, please contact night-coverage [email or web address] Password [email or web address] 09/03/2020, 11:42 AM   LOS: 1 day

## 2020-09-03 NOTE — Progress Notes (Signed)
Dressing change to the left flank. Removed old dressing, moderate amount of purulent drainage to the lower left nephrostomy. Cleaned around the site with ns and gauze, flushed the tube with 5cc sterile water. Pt tolerated well. No immediate return. Covered with ABD X2. Pt stated the tube hasn't drained since coming to the hospital. Pt also stated if something is not done she will leave and go to [place] for additional treatment tomorrow.

## 2020-09-04 ENCOUNTER — Ambulatory Visit (HOSPITAL_COMMUNITY)
Admit: 2020-09-04 | Discharge: 2020-09-04 | Disposition: A | Discharge status: Home / Self Care | Payer: BC Managed Care – PPO | Attending: Internal Medicine | Admitting: Internal Medicine

## 2020-09-04 ENCOUNTER — Inpatient Hospital Stay: Payer: Self-pay | Admitting: Internal Medicine

## 2020-09-04 DIAGNOSIS — Z4682 Encounter for fitting and adjustment of non-vascular catheter: Secondary | ICD-10-CM | POA: Diagnosis not present

## 2020-09-04 DIAGNOSIS — K651 Peritoneal abscess: Secondary | ICD-10-CM | POA: Diagnosis not present

## 2020-09-04 HISTORY — PX: IR CATHETER TUBE CHANGE: IMG717

## 2020-09-04 LAB — IRON AND TIBC
Iron: 17 ug/dL — ABNORMAL LOW (ref 28–170)
Saturation Ratios: 7 % — ABNORMAL LOW (ref 10.4–31.8)
TIBC: 262 ug/dL (ref 250–450)
UIBC: 245 ug/dL

## 2020-09-04 LAB — MAGNESIUM: Magnesium: 1.6 mg/dL — ABNORMAL LOW (ref 1.7–2.4)

## 2020-09-04 LAB — BASIC METABOLIC PANEL
Anion gap: 8 (ref 5–15)
BUN: 7 mg/dL (ref 6–20)
CO2: 26 mmol/L (ref 22–32)
Calcium: 8.6 mg/dL — ABNORMAL LOW (ref 8.9–10.3)
Chloride: 104 mmol/L (ref 98–111)
Creatinine, Ser: 0.63 mg/dL (ref 0.44–1.00)
GFR, Estimated: >60 mL/min (ref >60)
Glucose, Bld: 105 mg/dL — ABNORMAL HIGH (ref 70–99)
Potassium: 3.1 mmol/L — ABNORMAL LOW (ref 3.5–5.1)
Sodium: 138 mmol/L (ref 135–145)

## 2020-09-04 LAB — GLUCOSE, CAPILLARY
Glucose-Capillary: 117 mg/dL — ABNORMAL HIGH (ref 70–99)
Glucose-Capillary: 96 mg/dL (ref 70–99)
Glucose-Capillary: 69 mg/dL — ABNORMAL LOW (ref 70–99)

## 2020-09-04 LAB — CBC
HCT: 25.2 % — ABNORMAL LOW (ref 36.0–46.0)
Hemoglobin: 7.7 g/dL — ABNORMAL LOW (ref 12.0–15.0)
MCH: 28.9 pg (ref 26.0–34.0)
MCHC: 30.6 g/dL (ref 30.0–36.0)
MCV: 94.7 fL (ref 80.0–100.0)
Platelets: 450 10*3/uL — ABNORMAL HIGH (ref 150–400)
RBC: 2.66 MIL/uL — ABNORMAL LOW (ref 3.87–5.11)
RDW: 14.4 % (ref 11.5–15.5)
WBC: 7 10*3/uL (ref 4.0–10.5)
nRBC: 0 % (ref 0.0–0.2)

## 2020-09-04 LAB — FOLATE: Folate: 9.4 ng/mL (ref >5.9)

## 2020-09-04 LAB — VITAMIN B12: Vitamin B-12: 415 pg/mL (ref 180–914)

## 2020-09-04 LAB — FERRITIN: Ferritin: 165 ng/mL (ref 11–307)

## 2020-09-04 MED ORDER — MAGNESIUM SULFATE 2 GM/50ML IV SOLN
2.0000 g | Freq: Once | INTRAVENOUS | Status: AC
  Administered 2020-09-04: 2 g via INTRAVENOUS
  Filled 2020-09-04: qty 50

## 2020-09-04 MED ORDER — FERROUS SULFATE 325 (65 FE) MG PO TABS
325.0000 mg | ORAL_TABLET | Freq: Every day | ORAL | Status: DC
  Administered 2020-09-05: 325 mg via ORAL
  Filled 2020-09-04: qty 1

## 2020-09-04 MED ORDER — FENTANYL CITRATE (PF) 100 MCG/2ML IJ SOLN
INTRAMUSCULAR | Status: AC
  Filled 2020-09-04: qty 2

## 2020-09-04 MED ORDER — IOHEXOL 300 MG/ML  SOLN
50.0000 mL | Freq: Once | INTRAMUSCULAR | Status: AC | PRN
  Administered 2020-09-04: 20 mL

## 2020-09-04 MED ORDER — FENTANYL CITRATE (PF) 100 MCG/2ML IJ SOLN
INTRAMUSCULAR | Status: AC | PRN
  Administered 2020-09-04 (×2): 50 ug via INTRAVENOUS

## 2020-09-04 MED ORDER — LIDOCAINE HCL 1 % IJ SOLN
INTRAMUSCULAR | Status: AC
  Filled 2020-09-04: qty 20

## 2020-09-04 MED ORDER — MIDAZOLAM HCL 2 MG/2ML IJ SOLN
INTRAMUSCULAR | Status: AC | PRN
  Administered 2020-09-04 (×2): 1 mg via INTRAVENOUS

## 2020-09-04 MED ORDER — POTASSIUM CHLORIDE 10 MEQ/100ML IV SOLN
INTRAVENOUS | Status: AC
  Administered 2020-09-04: 10 meq
  Filled 2020-09-04: qty 100

## 2020-09-04 MED ORDER — POTASSIUM CHLORIDE 10 MEQ/100ML IV SOLN
10.0000 meq | INTRAVENOUS | Status: AC
  Administered 2020-09-04 (×3): 10 meq via INTRAVENOUS
  Filled 2020-09-04 (×3): qty 100

## 2020-09-04 MED ORDER — MIDAZOLAM HCL 2 MG/2ML IJ SOLN
INTRAMUSCULAR | Status: AC
  Filled 2020-09-04: qty 2

## 2020-09-04 MED ORDER — LIDOCAINE HCL (PF) 1 % IJ SOLN
INTRAMUSCULAR | Status: AC | PRN
  Administered 2020-09-04: 5 mL

## 2020-09-04 NOTE — Progress Notes (Signed)
At resting state pt reported pain 0/10. Pt ambulated to restroom and immediately reports pain of 7/10. Pt describes pain as stabbing when she gets up.

## 2020-09-04 NOTE — Progress Notes (Signed)
Pharmacy Antibiotic Note  Tammie [name] is a 60 y.o. female admitted on 08/31/2020 with sepsis.  Pharmacy has been consulted for cefepime. PTA on cefazolin 2gm IV q8h thru 2/17 for pyelonephritis, intra-abdominal abscess. Readmitted with chills, leukocytosis and decreased output from drain. Plan is for drain placement 1/27.  AF and WBC nl.  Plan: Continue Cefepime 2g IV Q8H. Monitor V/S, labs  F/U transition back to cefazolin if warranted  Height: 5\' 1"  (154.9 cm) Weight: 74.4 kg (164 lb) IBW/kg (Calculated) : 47.8  Temp (24hrs), Avg:99 F (37.2 C), Min:98.4 F (36.9 C), Max:100.2 F (37.9 C)  Recent Labs  Lab 08/31/20 1916 08/31/20 2315 09/01/20 0110 09/01/20 0744 09/03/20 0426 09/04/20 0418  WBC 21.5*  --   --  9.1 7.9 7.0  CREATININE  --  0.79 0.79 0.63 0.54 0.63  LATICACIDVEN 1.1  --   --   --   --   --     Estimated Creatinine Clearance: 69.8 mL/min (by C-G formula based on SCr of 0.63 mg/dL).    Allergies  Allergen Reactions  . Codeine Rash   Antibiotics: Cefepime 1/24 >> Vanco 1/24 >> 1/25  Microbiology: 1/23 Bcx: ngtd 1/24 BCX: ngtd 1/24 Ucx: >100K cfu/ml yeast  Thank you for allowing pharmacy to be a part of this patient's care. 2/24, BS Pharm D, BCPS Clinical Pharmacist Pager [phone] 09/04/2020 11:00 AM

## 2020-09-04 NOTE — Procedures (Signed)
  Procedure: Exchange/upsize/reposition L perinephric drain   Exchange/upsize L retroperitoneal drain  Both to 23F EBL:   minimal Complications:  none immediate  See full dictation in [employer].  Thora Lance MD Main # [phone] Pager  [phone] Mobile [phone]

## 2020-09-04 NOTE — Progress Notes (Signed)
PROGRESS NOTE  Tammie [name] NID:[id number] DOB: Oct 27, 1960 DOA: 08/31/2020 PCP: Patient, No Pcp Per   Brief History:  60 year old female with a history of diabetes mellitus type 2, hypertension, and emphysematous pyelonephritis presenting with fevers and associated nausea and vomiting.  She had noticed decreased output from one of her abscess drains. She presented to the emergency room where she was noted to be hypotensive and had a significant leukocytosis.  The patient has a rather complicated history after hospitalization from 07/31/2020 to 08/18/2020.  During that hospitalization, the patient was treated for sepsis secondary to emphysematous pyelonephritis and a pelvic abscess and perinephric abscess.  Cultures ultimately grew pansensitive E. coli.  During that hospitalization, the patient required placement of a left percutaneous nephrostomy tube as well as to drains for a pelvic abscess as well as a perirenal abscess.  Her hospitalization was also prolonged and complicated secondary to severe hyponatremia for which she required hypertonic saline.  Ultimately, a PICC line was placed on 08/09/2020, and she was discharged home with continuous infusion cefazolin with a previous in date of 09/25/2020.  She had follow-up with infectious disease on 09/04/2020.  Assessment/Plan: Left perinephric abscess/Pyelonephritis -Recent admission for the same, she was discharged on 1/10 with percutaneous drain placement for abscess and plans for intravenous Ancef until 2/17 -Patient readmitted with chills, leukocytosis, decreased output from drain -1/24 CT abd--left perinephric abscess slightly decreased in size compared to prior; L-PCN in appropriate position without hydronephrosis; perinephric drain within the collection though partially withdrawn; left posterior pararenal U-shaped collection unchanged with drain in appropriate position -Patient seen and discussed with urology, Dr. Ronne Binning who recommended  patient's abscess drain(d) be replaced -reconsulted interventional radiology-->plans for drain exchange 09/04/20 -Continue IV antibiotics, currently on cefepime -Cultures have not shown any growth -Anticipate transitioning the patient back to cefazolin upon discharge to complete outpatient course -Drain #1 (pararenal) 100cc last shift -Drain #2 (pelvic) 10cc  History of hydronephrosis -Left percutaneous nephrostomy tube in place that is draining -Renal function preserved  Diabetes type 2, uncontrolled with hyperglycemia -Blood sugars currently stable/controlled -Continue on Lantus and sliding scale insulin -08/01/20 A1C--12.2  Hypokalemia/Hypomagnesemia -Replaced  Anemia of chronic disease -Suspect this is secondary to her infectious/inflammatory process -Hemoglobin checked yesterday was 7.4 -Will be repeated and plan to transfuse for hemoglobin<7 -iron saturation--7%, ferritin 165 -check B12--415 -folate--9.4  Funguria -does not appear clinically significant -replace drains -pt clinically improving without anti-fungal tx      Status is: Inpatient  Remains inpatient appropriate because:IV treatments appropriate due to intensity of illness or inability to take PO   Dispo: The patient is from: Home  Anticipated d/c is to: Home  Anticipated d/c date is: 1/28  Patient currently is not medically stable to d/c.              Difficult to place patient No        Family Communication:  Spouse updated at bedside 1/26  Consultants:  IR, urology  Code Status:  FULL   DVT Prophylaxis:  [place] Lovenox   Procedures: As Listed in Progress Note Above  Antibiotics: Cefepime 1/23>>    Subjective: Patient denies fevers, chills, headache, chest pain, dyspnea, nausea, vomiting, diarrhea, abdominal pain, dysuria, hematuria, hematochezia, and melena.   Objective: Vitals:   09/03/20 1007 09/03/20 1416  09/03/20 2128 09/04/20 0504  BP: 129/89 135/77 126/78 121/68  Pulse: 95 95 84 84  Resp: 19 19 20 20   Temp: 97.7 F (  36.5 C) 98.4 F (36.9 C) 100.2 F (37.9 C) 98.4 F (36.9 C)  TempSrc: Oral Oral Oral Oral  SpO2: 98% 98% 93% 100%  Weight:      Height:        Intake/Output Summary (Last 24 hours) at 09/04/2020 0918 Last data filed at 09/03/2020 1830 Gross per 24 hour  Intake 480 ml  Output 170 ml  Net 310 ml   Weight change:  Exam:   General:  Pt is alert, follows commands appropriately, not in acute distress  HEENT: No icterus, No thrush, No neck mass, /AT  Cardiovascular: RRR, S1/S2, no rubs, no gallops  Respiratory: bibasilar crackles. No wheeze  Abdomen: Soft/+BS, non tender, non distended, no guarding  Extremities: No edema, No lymphangitis, No petechiae, No rashes, no synovitis   Data Reviewed: I have personally reviewed following labs and imaging studies Basic Metabolic Panel: Recent Labs  Lab 08/31/20 2315 09/01/20 0110 09/01/20 0744 09/03/20 0426 09/04/20 0418  NA 135 137 139 139 138  K 2.9* 3.0* 3.5 2.9* 3.1*  CL 99 100 105 103 104  CO2 26 25 25 26 26   GLUCOSE 187* 165* 155* 143* 105*  BUN 11 10 8 6 7   CREATININE 0.79 0.79 0.63 0.54 0.63  CALCIUM 8.4* 8.7* 8.6* 8.3* 8.6*  MG  --   --  1.5*  --  1.6*   Liver Function Tests: Recent Labs  Lab 08/31/20 2315 09/01/20 0110 09/01/20 0744 09/03/20 0426  AST 15 15 15  10*  ALT 9 9 8 8   ALKPHOS 205* 207* 182* 162*  BILITOT 0.6 0.6 0.7 0.4  PROT 6.4* 6.5 6.0* 5.9*  ALBUMIN 2.5* 2.7* 2.5* 2.3*   No results for input(s): LIPASE, AMYLASE in the last 168 hours. No results for input(s): AMMONIA in the last 168 hours. Coagulation Profile: Recent Labs  Lab 08/31/20 2315  INR 1.2   CBC: Recent Labs  Lab 08/31/20 1916 09/01/20 0744 09/03/20 0426 09/04/20 0418  WBC 21.5* 9.1 7.9 7.0  NEUTROABS 19.7* 6.8  --   --   HGB 8.5* 7.4* 7.2* 7.7*  HCT 26.7* 24.4* 23.2* 25.2*  MCV 93.7 95.3 94.3  94.7  PLT 385 352 352 450*   Cardiac Enzymes: No results for input(s): CKTOTAL, CKMB, CKMBINDEX, TROPONINI in the last 168 hours. BNP: Invalid input(s): POCBNP CBG: Recent Labs  Lab 09/03/20 0830 09/03/20 1157 09/03/20 1649 09/03/20 2130 09/04/20 0857  GLUCAP 124* 119* 100* 139* 117*   HbA1C: No results for input(s): HGBA1C in the last 72 hours. Urine analysis:    Component Value Date/Time   COLORURINE AMBER (A) 09/01/2020 0005   APPEARANCEUR TURBID (A) 09/01/2020 0005   LABSPEC 1.006 09/01/2020 0005   PHURINE 6.0 09/01/2020 0005   GLUCOSEU 50 (A) 09/01/2020 0005   HGBUR MODERATE (A) 09/01/2020 0005   BILIRUBINUR NEGATIVE 09/01/2020 0005   KETONESUR NEGATIVE 09/01/2020 0005   PROTEINUR 100 (A) 09/01/2020 0005   NITRITE NEGATIVE 09/01/2020 0005   LEUKOCYTESUR MODERATE (A) 09/01/2020 0005   Sepsis Labs: @LABRCNTIP (procalcitonin:4,lacticidven:4) ) Recent Results (from the past 240 hour(s))  Blood Culture (routine x 2)     Status: None (Preliminary result)   Collection Time: 08/31/20 11:15 PM   Specimen: BLOOD LEFT FOREARM  Result Value Ref Range Status   Specimen Description BLOOD LEFT FOREARM  Final   Special Requests   Final    BOTTLES DRAWN AEROBIC AND ANAEROBIC Blood Culture adequate volume   Culture   Final    NO GROWTH 4  DAYS Performed at [facility], [address], [place], [place] [address]    Report Status PENDING  Incomplete  SARS Coronavirus 2 by RT PCR (hospital order, performed in [facility] hospital lab) Nasopharyngeal Nasopharyngeal Swab     Status: None   Collection Time: 08/31/20 11:37 PM   Specimen: Nasopharyngeal Swab  Result Value Ref Range Status   SARS Coronavirus 2 NEGATIVE NEGATIVE Final    Comment: (NOTE) SARS-CoV-2 target nucleic acids are NOT DETECTED.  The SARS-CoV-2 RNA is generally detectable in upper and lower respiratory specimens during the acute phase of infection. The lowest concentration of SARS-CoV-2 viral copies this  assay can detect is 250 copies / mL. A negative result does not preclude SARS-CoV-2 infection and should not be used as the sole basis for treatment or other patient management decisions.  A negative result may occur with improper specimen collection / handling, submission of specimen other than nasopharyngeal swab, presence of viral mutation(s) within the areas targeted by this assay, and inadequate number of viral copies (<250 copies / mL). A negative result must be combined with clinical observations, patient history, and epidemiological information.  Fact Sheet for Patients:   BoilerBrush.com.cy  Fact Sheet for Healthcare Providers: https://pope.com/  This test is not yet approved or  cleared by the [place] FDA and has been authorized for detection and/or diagnosis of SARS-CoV-2 by FDA under an Emergency Use Authorization (EUA).  This EUA will remain in effect (meaning this test can be used) for the duration of the COVID-19 declaration under Section 564(b)(1) of the Act, 21 U.S.C. section 360bbb-3(b)(1), unless the authorization is terminated or revoked sooner.  Performed at [facility], [address], [place], [place] [address]   Urine culture     Status: Abnormal   Collection Time: 09/01/20 12:05 AM   Specimen: In/Out Cath Urine  Result Value Ref Range Status   Specimen Description   Final    IN/OUT CATH URINE Performed at [facility], [address] [place], [place] [address]    Special Requests   Final    NONE Performed at [facility], [address], [place], [place] [address]    Culture >=100,000 COLONIES/mL YEAST (A)  Final   Report Status 09/02/2020 FINAL  Final  Blood Culture (routine x 2)     Status: None (Preliminary result)   Collection Time: 09/01/20  1:10 AM   Specimen: BLOOD LEFT WRIST  Result Value Ref Range Status   Specimen Description BLOOD LEFT WRIST  Final   Special Requests   Final     BOTTLES DRAWN AEROBIC AND ANAEROBIC Blood Culture adequate volume   Culture   Final    NO GROWTH 3 DAYS Performed at [facility], [address] [place], [place] [address]    Report Status PENDING  Incomplete  Culture, blood (x 2)     Status: None (Preliminary result)   Collection Time: 09/01/20  7:43 AM   Specimen: BLOOD LEFT WRIST  Result Value Ref Range Status   Specimen Description BLOOD LEFT WRIST  Final   Special Requests   Final    BOTTLES DRAWN AEROBIC AND ANAEROBIC Blood Culture adequate volume   Culture   Final    NO GROWTH 3 DAYS Performed at [facility], [address], [place], [place] [address]    Report Status PENDING  Incomplete  Culture, blood (x 2)     Status: None (Preliminary result)   Collection Time: 09/01/20  9:14 AM   Specimen:  BLOOD LEFT HAND  Result Value Ref Range Status   Specimen Description BLOOD LEFT HAND  Final   Special Requests   Final    BOTTLES DRAWN AEROBIC ONLY Blood Culture results may not be optimal due to an inadequate volume of blood received in culture bottles   Culture   Final    NO GROWTH 3 DAYS Performed at [facility], [address] [place], [place] [address]    Report Status PENDING  Incomplete     Scheduled Meds: . Chlorhexidine Gluconate Cloth  6 each Topical Daily  . enoxaparin (LOVENOX) injection  40 mg Subcutaneous Q24H  . insulin aspart  0-15 Units Subcutaneous TID WC  . insulin glargine  8 Units Subcutaneous BID   Continuous Infusions: . ceFEPime (MAXIPIME) IV 2 g (09/04/20 0558)    Procedures/Studies: CT ABDOMEN PELVIS W WO CONTRAST  Result Date: 08/13/2020 CLINICAL DATA:  Complicated pyelonephritis.  Abscess. EXAM: CT ABDOMEN AND PELVIS WITHOUT AND WITH CONTRAST TECHNIQUE: Multidetector CT imaging of the abdomen and pelvis was performed following the standard protocol before and following the bolus administration of intravenous contrast. CONTRAST:  OMNIPAQUE IOHEXOL 300 MG/ML  SOLN COMPARISON:  08/08/2020  FINDINGS: Lower Chest: Small bilateral pleural effusions are again seen with atelectasis or consolidation in the posterior lower lobes. Multiple small pulmonary nodules are again seen in the visualized portion of the right lower lung, without significant change. Hepatobiliary: No hepatic masses identified. Gallbladder is unremarkable. No evidence of biliary ductal dilatation. Pancreas:  No mass or inflammatory changes. Spleen: Within normal limits in size and appearance. Adrenals/Urinary Tract: A left percutaneous nephrostomy tube is seen in appropriate position and there is no evidence of hydronephrosis. Diffuse left renal swelling is again seen with multiple small irregular fluid density collections throughout the renal parenchyma, and a perinephric fluid and gas collection is again seen along the lateral and posterior margins of the kidney. This measures 9.4 x 4.3 cm on image 29/7, increased from 7.4 x 2.5 cm when remeasured in same planes on prior exam. A 2nd percutaneous drainage catheter is seen in the left lower quadrant along the anterior aspect of the left psoas muscle. Left retroperitoneal fluid collection in this region measures 8.7 x 4.4 cm on image 54/2, without significant change. A 3rd fluid and gas collection is seen in the left pararenal space which measures 6.7 x 2.7 cm on image 46/7, without significant change in size since prior exam. Foley catheter is noted within the urinary bladder. Stomach/Bowel: No evidence of obstruction, inflammatory process or abnormal fluid collections. Vascular/Lymphatic: No pathologically enlarged lymph nodes. No abdominal aortic aneurysm. Reproductive:  No mass or other significant abnormality. Other:  None. Musculoskeletal:  No suspicious bone lesions identified. IMPRESSION: Left percutaneous nephrostomy tube in appropriate position. No evidence of hydronephrosis. Stable diffuse left renal parenchymal swelling with multiple small internal fluid density collections.  Mild increase in size of left perinephric fluid and gas collection. No significant change in size of fluid and gas collections along the anterior aspect of left psoas muscle, and in the left lateral pararenal space. Stable small bilateral pleural effusions and posterior lower lobe atelectasis or consolidation. Stable small right lower lung pulmonary nodules. Electronically Signed   By: Danae Orleans M.D.   On: 08/13/2020 08:27   DG Chest 2 View  Result Date: 08/31/2020 CLINICAL DATA:  Shortness of breath. Possible PICC line infection. On antibiotics. EXAM: CHEST - 2 VIEW COMPARISON:  08/07/2020 and CT of 08/08/2020. FINDINGS: Left upper quadrant  percutaneous drains. A right-sided PICC line terminates at the low SVC. Midline trachea. Mild cardiomegaly. Trace right pleural effusion. No pneumothorax. The dominant right upper lobe septic embolus measuring 2.4 cm today versus 3.1 cm on 08/07/2020 (when remeasured). The underlying pattern of diffuse reticulonodular opacification is difficult to directly compare to the prior AP radiograph, but may be slightly improved. No lobar consolidation. Mild left base subsegmental atelectasis. IMPRESSION: Reticulonodular opacification throughout both lungs, consistent with septic emboli when compared to 08/08/2020 CT. This may be slightly improved compared to a on a AP radiographs most recent 08/07/2020. The right upper lobe dominant lesion is slightly decreased in size. Trace right pleural effusion. Electronically Signed   By: Jeronimo Greaves M.D.   On: 08/31/2020 18:42   DG Chest 2 View  Result Date: 08/07/2020 CLINICAL DATA:  Sepsis.  Left pyelonephritis. EXAM: CHEST - 2 VIEW COMPARISON:  08/04/2020 FINDINGS: Stable cardiomegaly. Small left pleural effusion and left basilar atelectasis or infiltrate show no significant change. Probable small layering right pleural effusion also noted. Increased airspace opacity is seen in the right upper lobe since prior study. IMPRESSION:  Increased right upper lobe airspace opacity. Stable small left pleural effusion, and left basilar atelectasis versus infiltrate. Probable small layering right pleural effusion. Electronically Signed   By: Danae Orleans M.D.   On: 08/07/2020 12:54   CT ABDOMEN PELVIS W CONTRAST  Result Date: 09/01/2020 CLINICAL DATA:  Abdominal pain, fever EXAM: CT ABDOMEN AND PELVIS WITH CONTRAST TECHNIQUE: Multidetector CT imaging of the abdomen and pelvis was performed using the standard protocol following bolus administration of intravenous contrast. CONTRAST:  OMNIPAQUE IOHEXOL 300 MG/ML  SOLN COMPARISON:  08/16/2020, 08/13/2020 FINDINGS: Lower chest: Subpleural nodule within the visualized right lung base and atelectasis within the left lung base are unchanged. Previously noted pleural effusions have resolved. The visualized heart and pericardium are unremarkable. Hepatobiliary: No focal liver abnormality is seen. No gallstones, gallbladder wall thickening, or biliary dilatation. Pancreas: Unremarkable Spleen: Unremarkable Adrenals/Urinary Tract: The adrenal glands are unremarkable. The kidneys are normal in size and position. The right kidney is unremarkable. Percutaneous nephrostomy is seen within the left kidney, unchanged with its retaining loop formed within the left renal pelvis. Percutaneous pigtail drainage catheter is seen within the a posterior perinephric fluid collection, but has been partially withdrawn with its retaining loop now just within the loculated collection. The collection is slightly improved in overall size, now measuring 3.9 x 8.6 by 13.2 cm in greatest dimension on axial image # 29 and sagittal reformat # 105. Punctate foci of gas are again seen within the collection, though this appears reduced when compared to prior examination. A second collection is seen within the posterior left pararenal space. This U shaped collection measures roughly 11.2 x 9.7 cm at its base in the region of the  percutaneous drainage catheter, axial image # 52. Superolaterally, this measures 4.2 x 11.1 cm at axial image # 47. These appear unchanged from prior examination, though appear improved when compared to remote prior examination of 08/08/2020. Pigtail drainage catheter within the basilar component appears unchanged in position. Stomach/Bowel: Stomach, small bowel, and large bowel are unremarkable. Appendix normal. No free intraperitoneal gas or fluid. Vascular/Lymphatic: The abdominal vasculature is unremarkable. Shotty left periaortic lymphadenopathy likely represents reactive adenopathy. No frankly pathologic adenopathy within the abdomen and pelvis. Reproductive: Uterus and bilateral adnexa are unremarkable. Other: No abdominal wall hernia.  Rectum unremarkable. Musculoskeletal: No acute bone abnormality. No lytic or blastic bone lesions are seen.  IMPRESSION: Left perinephric abscess has slightly decreased in size when compared to prior examination. Percutaneous drainage catheter is seen within the collection, though has been partially withdrawn when compared to prior exam. Left posterior pararenal U shaped abscess appears unchanged when compared to prior examination. Percutaneous drainage catheter appears appropriately position within the dependent component of the collection, unchanged from prior examination. Left percutaneous nephrostomy in appropriate position. No hydronephrosis. Resolved bilateral pleural effusions. Stable right subpleural pulmonary nodule, likely inflammatory in nature. Electronically Signed   By: Helyn Numbers MD   On: 09/01/2020 01:58   CT CHEST ABDOMEN PELVIS W CONTRAST  Result Date: 08/08/2020 CLINICAL DATA:  60 year old female with history of complicated pyelonephritis. EXAM: CT CHEST, ABDOMEN, AND PELVIS WITH CONTRAST TECHNIQUE: Multidetector CT imaging of the chest, abdomen and pelvis was performed following the standard protocol during bolus administration of intravenous  contrast. CONTRAST:  OMNIPAQUE IOHEXOL 300 MG/ML  SOLN COMPARISON:  CT the chest, abdomen and pelvis 08/01/2020. FINDINGS: CT CHEST FINDINGS Cardiovascular: Heart size is normal. There is no significant pericardial fluid, thickening or pericardial calcification. There is aortic atherosclerosis, as well as atherosclerosis of the great vessels of the mediastinum and the coronary arteries, including calcified atherosclerotic plaque in the left anterior descending coronary artery. Dilatation of the pulmonic trunk (3.6 cm in diameter). Mediastinum/Nodes: No pathologically enlarged in mediastinal or hilar lymph nodes. Esophagus is unremarkable in appearance. No axillary lymphadenopathy. Lungs/Pleura: Numerous pulmonary nodules and masses are again noted throughout the lungs bilaterally, many of which have central areas of cavitation. The largest of these is in the posterior aspect of the right upper lobe (axial image 54 of series 5) measuring 3.2 x 3.2 cm, with central low attenuation regions and internal cavitation, compatible with a large intrapulmonary abscess. Overall, findings are strongly suggestive of widespread septic emboli. Moderate bilateral pleural effusions lying dependently with extensive passive atelectasis in the lower lobes of the lungs bilaterally. Musculoskeletal: There are no aggressive appearing lytic or blastic lesions noted in the visualized portions of the skeleton. CT ABDOMEN PELVIS FINDINGS Hepatobiliary: No suspicious cystic or solid hepatic lesions. No intra or extrahepatic biliary ductal dilatation. Gallbladder is normal in appearance. Pancreas: No pancreatic mass. No pancreatic ductal dilatation. No peripancreatic fluid collections or inflammatory changes. Spleen: Unremarkable. Adrenals/Urinary Tract: New nephrostomy tube extends into the left renal pelvis and appears appropriately located. The left kidneys enlarged and very heterogeneous in appearance with multiple hypovascular  hypoenhancing regions, compatible with residual areas of pyelonephritis and associated renal and perinephric abscesses. These extend into the perinephric space where there is a combination of gas and fluid. Some of this tracks caudally where there is a larger collection of predominantly fluid, trace amount of gas, and some higher attenuation material adjacent to the tip of an indwelling drain (best appreciated on axial image 89 of series 2 and coronal image 74 of series 6) which measures approximately 4.3 x 2.1 x 3.6 cm. This perinephric fluid collection trucks caudally into the left hemipelvis along the left pelvic sidewall, coming into contact with the left iliacus musculature. Overall, the extent of fluid in this collection appears increased. This is well demonstrated in a relatively well-defined portion of the collection laterally which currently measures 9.5 x 3.6 cm (axial image 78 of series 2) and previously measured only 8.2 x 2.4 cm when measured in a similar fashion on 08/01/2020. Right kidney and bilateral adrenal glands are normal in appearance. No right hydroureteronephrosis. Urinary bladder is completely decompressed around an indwelling Foley  balloon catheter. Small amount of gas present in the lumen of the urinary bladder is presumably iatrogenic. Stomach/Bowel: The appearance of the stomach is normal. There is no pathologic dilatation of small bowel or colon. Normal appendix. Vascular/Lymphatic: Aortic atherosclerosis, without evidence of aneurysm or dissection in the abdominal or pelvic vasculature. Filling defect in the left renal vein best appreciated on coronal image 77 of series 6, compatible with nonocclusive left renal vein thrombus. No lymphadenopathy noted in the abdomen or pelvis. Reproductive: Uterus and ovaries are unremarkable in appearance. Other: Diffuse body wall edema. Trace volume of ascites. No pneumoperitoneum. Musculoskeletal: There are no aggressive appearing lytic or blastic  lesions noted in the visualized portions of the skeleton. IMPRESSION: 1. Compared to the prior study there is been interval nephrostomy tube placement which appears appropriately located, as well as percutaneous drainage catheter placement in the inferior aspect of the patient's extensive perinephric abscess. Unfortunately, the overall size of the abscess appears increased compared to the prior examination, and there is new high attenuation fluid within the inferior aspect of this collection, which could represent some recent hemorrhage. If there is concern for active hemorrhage, further evaluation with triple-phase CT of the abdomen and pelvis should be considered to evaluate for source of active bleeding. 2. Numerous large pulmonary nodules and masses in the lungs bilaterally, with imaging characteristics most compatible with extensive septic emboli with multifocal pulmonary abscess formation. 3. 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 This likely represents the source of emboli in this patient with evidence of septic embolization. 4. Moderate bilateral pleural effusions lying dependently with associated areas of passive atelectasis in the lower lobes of the lungs bilaterally. 5. Dilatation of the pulmonic trunk (3.6 cm in diameter), concerning for pulmonary arterial hypertension. 6. Additional incidental findings, as above. These results were called by telephone at the time of interpretation on 08/08/2020 at 5:38 pm to provider [facility], who verbally acknowledged these results. Electronically Signed   By: Trudie Reed M.D.   On: 08/08/2020 17:38   DG Abd 2 Views  Result Date: 08/08/2020 CLINICAL DATA:  Abdominal pain and decreased oral intake. EXAM: ABDOMEN - 2 VIEW COMPARISON:  Plain film of the abdomen dated 08/07/2020. FINDINGS: Mildly distended small bowel within the central abdomen with nonspecific air-fluid  levels in the upper pelvis. Air is again seen within the colon. No evidence of renal or ureteral calculi. Two percutaneous pigtail catheters are again seen within the LEFT abdomen. Dense consolidation the LEFT lung base, presumed pneumonia. IMPRESSION: 1. Mildly distended small bowel within the central abdomen with nonspecific air-fluid levels in the upper pelvis. Findings could represent ileus or partial small bowel obstruction. 2. LEFT lower lung consolidation, pneumonia versus atelectasis. Electronically Signed   By: Bary Richard M.D.   On: 08/08/2020 10:04   DG Abd 2 Views  Result Date: 08/07/2020 CLINICAL DATA:  Sepsis.  Left pyelonephritis. EXAM: ABDOMEN - 2 VIEW COMPARISON:  None. FINDINGS: Mild gaseous distention of colon is seen predominately involving the nondependent sigmoid colon. Scattered air-fluid levels are seen, however there is no evidence of dilated small bowel loops. These findings are likely due to mild ileus. No radiopaque urinary calculi identified. Two percutaneous pigtail catheters are seen in the left upper and lower quadrants. IMPRESSION: Probable mild adynamic ileus. Two percutaneous pigtail catheters in left upper and lower quadrants. Electronically Signed   By: Danae Orleans M.D.   On: 08/07/2020 12:51   CT RENAL STONE  STUDY  Result Date: 08/16/2020 CLINICAL DATA:  60 year old female with pyelonephritis and renal abscess. Follow-up CT. EXAM: CT ABDOMEN AND PELVIS WITHOUT CONTRAST TECHNIQUE: Multidetector CT imaging of the abdomen and pelvis was performed following the standard protocol without IV contrast. COMPARISON:  CT abdomen pelvis dated 08/13/2020. FINDINGS: Evaluation of this exam is limited in the absence of intravenous contrast. Lower chest: Partially visualized small bilateral pleural effusions and consolidative changes of the lung bases as seen previously. Several pulmonary nodules measure 7 mm in the right middle lobe. No intra-abdominal free air or free fluid.  Hepatobiliary: No focal liver abnormality is seen. No gallstones, gallbladder wall thickening, or biliary dilatation. Pancreas: Unremarkable. No pancreatic ductal dilatation or surrounding inflammatory changes. Spleen: Normal in size without focal abnormality. Adrenals/Urinary Tract: The adrenal glands unremarkable. Left percutaneous nephrostomy with pigtail tip in the left renal pelvis in similar position. There has been interval placement a percutaneous drainage catheter with pigtail tip in the posterior perinephric collection. Slight interval decrease in the size of the perinephric collection compared to prior CT. There is no hydronephrosis or nephrolithiasis on either side. The right kidney is unremarkable. The urinary bladder is decompressed around a Foley catheter. Stomach/Bowel: There is no bowel obstruction or active inflammation. The appendix is not visualized with certainty. No inflammatory changes identified in the right lower quadrant. Vascular/Lymphatic: Mild aortoiliac atherosclerotic disease. The IVC is unremarkable. No portal venous gas. There is no adenopathy. Reproductive: The uterus and ovaries are grossly unremarkable. Other: Mild diffuse subcutaneous edema. There has been interval placement of a percutaneous drainage catheter with tip in the fluid collection along the anterior aspect of the left psoas muscle. The collection has decreased in size now measuring approximately 4.2 x 8.0 cm (previously 4.4 x 8.7 cm). Musculoskeletal: No acute or significant osseous findings. IMPRESSION: 1. Interval placement of a percutaneous drainage catheter with tip in the posterior perinephric collection. Slight interval decrease in the size of the fluid collection compared to prior CT. 2. Decrease in the size of the collection anterior to the left psoas muscle status post percutaneous drainage catheter placement. Near 3. No hydronephrosis or nephrolithiasis. 4. No bowel obstruction. 5. Several pulmonary nodules  measure 7 mm in the right middle lobe. 6. Aortic Atherosclerosis (ICD10-I70.0). Electronically Signed   By: Elgie CollardArash  Radparvar M.D.   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 EXAM: CT IMAGE GUIDED DRAINAGE BY PERCUTANEOUS CATHETER COMPARISON:  None. MEDICATIONS: The patient is currently admitted to the hospital and receiving intravenous antibiotics. The antibiotics were administered within an appropriate time frame prior to the initiation of the procedure. ANESTHESIA/SEDATION: Moderate (conscious) sedation was employed during this procedure. A total of Versed 2 mg and Fentanyl 100 mcg was administered intravenously. Moderate Sedation Time: 20 minutes. The patient's level of consciousness and vital signs were monitored continuously by radiology nursing throughout the procedure under my direct supervision. CONTRAST:  None COMPLICATIONS: None immediate. PROCEDURE: Informed written consent was obtained from the patient after a discussion of the risks, benefits and alternatives to treatment. The patient was placed in a partial right lateral decubitus position on the CT gantry and a pre procedural CT was performed re-demonstrating the known abscess/fluid collection within the left posterior perinephric space. The procedure was planned. A timeout was performed prior to the initiation of the procedure.  The left flank was prepped and draped in the usual sterile fashion. The overlying soft tissues were anesthetized with 1% lidocaine with epinephrine. Appropriate trajectory was planned with the use of a 22 gauge spinal needle. An 18 gauge trocar needle was advanced into the abscess/fluid collection and a short Amplatz super stiff wire was coiled within the  collection. Appropriate positioning was confirmed with a limited CT scan. The tract was serially dilated allowing placement of a 12 [place] all-purpose drainage catheter. Appropriate positioning was confirmed with a limited postprocedural CT scan. Approximately 100 ml of purulent fluid was aspirated. The tube was connected to a bulb suction and sutured in place. A dressing was placed. The patient tolerated the procedure well without immediate post procedural complication. IMPRESSION: Successful CT guided placement of a 57 French all purpose drain catheter into the left posterior perinephric abscess with aspiration of 100 mL of purulent fluid. Samples were sent to the laboratory as requested by the ordering clinical team. Marliss Coots, MD [facility] Electronically Signed   By: Marliss Coots MD   On: 08/14/2020 15:01   [place] EKG SITE RITE  Result Date: 08/09/2020 If Site Rite image not attached, placement could not be confirmed due to current cardiac rhythm.   Catarina Hartshorn, DO  [facility]  If 7PM-7AM, please contact night-coverage [email or web address] Password [email or web address] 09/04/2020, 9:18 AM   LOS: 2 days

## 2020-09-04 NOTE — Progress Notes (Signed)
Pt has returned from [place] [facility] services via [facility]. Exchange of both nephrostomy tubes complete.

## 2020-09-04 NOTE — Progress Notes (Signed)
MD notified of new onset of sharp pain 11/10 (per patient) to the left flank. Pt stated she tried to ambulate to the bathroom when sharp pain began. Pt reports no prior pain to the site. PRN medication given (see MAR). Also noted, no drainage from any of the new site since returning from the procedure, as reported from prior shift. Pt resting, call bell in reach, pt educated on pain management. Will continue to monitor

## 2020-09-05 ENCOUNTER — Ambulatory Visit: Payer: Self-pay | Admitting: Urology

## 2020-09-05 DIAGNOSIS — J90 Pleural effusion, not elsewhere classified: Secondary | ICD-10-CM | POA: Diagnosis not present

## 2020-09-05 DIAGNOSIS — R6520 Severe sepsis without septic shock: Secondary | ICD-10-CM | POA: Diagnosis not present

## 2020-09-05 DIAGNOSIS — N151 Renal and perinephric abscess: Secondary | ICD-10-CM | POA: Diagnosis not present

## 2020-09-05 DIAGNOSIS — N12 Tubulo-interstitial nephritis, not specified as acute or chronic: Secondary | ICD-10-CM | POA: Diagnosis not present

## 2020-09-05 LAB — BASIC METABOLIC PANEL
Anion gap: 8 (ref 5–15)
BUN: 6 mg/dL (ref 6–20)
CO2: 25 mmol/L (ref 22–32)
Calcium: 8.7 mg/dL — ABNORMAL LOW (ref 8.9–10.3)
Chloride: 104 mmol/L (ref 98–111)
Creatinine, Ser: 0.54 mg/dL (ref 0.44–1.00)
GFR, Estimated: >60 mL/min (ref >60)
Glucose, Bld: 97 mg/dL (ref 70–99)
Potassium: 3.2 mmol/L — ABNORMAL LOW (ref 3.5–5.1)
Sodium: 137 mmol/L (ref 135–145)

## 2020-09-05 LAB — CBC
HCT: 24.8 % — ABNORMAL LOW (ref 36.0–46.0)
Hemoglobin: 7.5 g/dL — ABNORMAL LOW (ref 12.0–15.0)
MCH: 29 pg (ref 26.0–34.0)
MCHC: 30.2 g/dL (ref 30.0–36.0)
MCV: 95.8 fL (ref 80.0–100.0)
Platelets: 446 10*3/uL — ABNORMAL HIGH (ref 150–400)
RBC: 2.59 MIL/uL — ABNORMAL LOW (ref 3.87–5.11)
RDW: 14.4 % (ref 11.5–15.5)
WBC: 8.3 10*3/uL (ref 4.0–10.5)
nRBC: 0 % (ref 0.0–0.2)

## 2020-09-05 LAB — CULTURE, BLOOD (ROUTINE X 2)
Culture: NO GROWTH
Special Requests: ADEQUATE

## 2020-09-05 LAB — MAGNESIUM: Magnesium: 1.9 mg/dL (ref 1.7–2.4)

## 2020-09-05 LAB — GLUCOSE, CAPILLARY: Glucose-Capillary: 97 mg/dL (ref 70–99)

## 2020-09-05 MED ORDER — FERROUS SULFATE 325 (65 FE) MG PO TABS: 325.0000 mg | ORAL_TABLET | Freq: Every day | ORAL | 3 refills | Status: AC

## 2020-09-05 MED ORDER — HEPARIN SOD (PORK) LOCK FLUSH 100 UNIT/ML IV SOLN
250.0000 [IU] | INTRAVENOUS | Status: DC | PRN
  Filled 2020-09-05: qty 5

## 2020-09-05 MED ORDER — POTASSIUM CHLORIDE CRYS ER 20 MEQ PO TBCR
40.0000 meq | EXTENDED_RELEASE_TABLET | Freq: Once | ORAL | Status: AC
  Administered 2020-09-05: 40 meq via ORAL
  Filled 2020-09-05: qty 2

## 2020-09-05 MED ORDER — NOVOLIN R FLEXPEN RELION 100 UNIT/ML IJ SOPN: PEN_INJECTOR | INTRAMUSCULAR | 2 refills | Status: AC

## 2020-09-05 NOTE — Progress Notes (Signed)
PHARMACY CONSULT NOTE FOR:  OUTPATIENT  PARENTERAL ANTIBIOTIC THERAPY (OPAT)  Indication: left perinephric abscess/pyelonephritis Regimen: Ancef 2g IV q8h End date: 09/25/20  IV antibiotic discharge orders are pended. To discharging provider:  please sign these orders via discharge navigator,  Select New Orders & click on the button choice - Manage This Unsigned Work.     Thank you for allowing pharmacy to be a part of this patient's care.  Tama High 09/05/2020, 10:17 AM

## 2020-09-05 NOTE — Discharge Summary (Signed)
Physician Discharge Summary  Shermika Balthaser KVQ:[id number] DOB: 1960/08/28 DOA: 08/31/2020  PCP: Patient, No Pcp Per  Admit date: 08/31/2020 Discharge date: 09/05/2020  Admitted From: Home Disposition:  Home   Recommendations for Outpatient Follow-up:  1. Follow up with PCP in 1-2 weeks 2. Please obtain BMP/CBC in one week 3. Please follow up on the following pending results:  Home Health: HHRN Equipment/Devices: resume home IV abx Discharge Condition: Stable CODE STATUS: FULL Diet recommendation: Heart Healthy / Carb Modified /   Brief/Interim Summary: 60 year old female with a history of diabetes mellitus type 2, hypertension, and emphysematous pyelonephritis presenting with fevers and associated nausea and vomiting.She had noticed decreased output from one of her abscess drains. She presented to the emergency room where she was noted to be hypotensive and had a significant leukocytosis.The patient has a rather complicated history after hospitalization from 07/31/2020 to 08/18/2020.During that hospitalization, the patient was treated for sepsis secondary to emphysematous pyelonephritis and a pelvic abscess and perinephric abscess.Cultures ultimately grewpansensitiveE. coli. During that hospitalization, the patient required placement of a left percutaneous nephrostomy tube as well as to drains for a pelvic abscess as well as a perirenal abscess. Her hospitalization was also prolonged and complicated secondary to severe hyponatremia for which she required hypertonic saline. Ultimately, a PICC line was placed on 08/09/2020, and she was discharged home with continuous infusion cefazolin with a previous in date of 09/25/2020. She hadfollow-up with infectious disease on 09/04/2020.  Discharge Diagnoses:  Left perinephric abscess/Pyelonephritis -Recent admission for the same, she was discharged on 1/10 with percutaneous drain placement for abscess and plans for intravenous Ancef until  09/25/20 -Patient readmitted with chills, leukocytosis, decreased output from drain -1/24 CT abd--left perinephric abscess slightly decreased in size compared to prior; L-PCNin appropriate position without hydronephrosis;perinephric drain within the collection though partially withdrawn;left posterior pararenal U-shaped collection unchanged with drain in appropriate position -Patient seen and discussed with urology, Dr. Alyson Ingles who recommended patient's abscess drain(d)be replaced -reconsultedinterventional radiology-->plans for drain exchange 09/04/20 -Continue IV antibiotics, currently on cefepime-->home with cefazolin and follow up with ID -Cultures have not shown any growth -Anticipate transitioning the patient back to cefazolin upon discharge to complete outpatient course -Drain #1 (pararenal)--exchanged and upsized 1/27 by IR -Drain #2 (pelvic)--exchanged and upsized 1/27 -urology ultimately planning possible nephrectomy in future--discussed with Dr. Alyson Ingles on day of d/c  History of hydronephrosis -Left percutaneous nephrostomy tube in place that is draining -Renal functionpreserved  Diabetestype 2, uncontrolled with hyperglycemia -Blood sugars currently stable/controlled -Continue on Lantus and sliding scale insulin -08/01/20 A1C--12.2  Hypokalemia/Hypomagnesemia -Replaced  Anemiaof chronic disease -Suspect this is secondary to her infectious/inflammatory process -Hemoglobin checked yesterday was 7.4 -Will be repeated and plan to transfuse for hemoglobin<7 -iron saturation--7%, ferritin 165 -check B12--415 -folate--9.4  Funguria -does not appear clinically significant -replaced drains -pt clinically improving without anti-fungal tx   Discharge Instructions   Allergies as of 09/05/2020      Reactions   Codeine Rash      Medication List    STOP taking these medications   NovoLOG FlexPen 100 UNIT/ML FlexPen Generic drug: insulin aspart     TAKE  these medications   acetaminophen 500 MG tablet Commonly known as: TYLENOL Take 1,000 mg by mouth every 6 (six) hours as needed for mild pain.   blood glucose meter kit and supplies Kit Dispense based on patient and insurance preference. Use up to four times daily as directed. (FOR ICD-9 250.00, 250.01).   ceFAZolin  IVPB Commonly known  as: ANCEF Inject 2 g into the vein every 8 (eight) hours. Indication:  Emphysematous pyelonephritis, intra-abdominal abscess First Dose: Yes Last Day of Therapy:  09/25/2020 Labs - Once weekly:  CBC/D and BMP, Labs - Every other week:  ESR and CRP Method of administration: IV Push Method of administration may be changed at the discretion of home infusion pharmacist based upon assessment of the patient and/or caregiver's ability to self-administer the medication ordered.   CINNAMON PO Take 1 tablet by mouth daily.   ferrous sulfate 325 (65 FE) MG tablet Take 1 tablet (325 mg total) by mouth daily with breakfast.   Lantus SoloStar 100 UNIT/ML Solostar Pen Generic drug: insulin glargine Inject 8 Units into the skin 2 (two) times daily.   NovoLIN R FlexPen ReliOn 100 UNIT/ML Sopn Generic drug: Insulin Regular Human Sugar 121-150--2 units; 151-200 3 units; 201-250 5 units; 251-300--8 units; 301-350--11 units; 351-400--15 units   Pen Needles 3/16" 31G X 5 MM Misc 1 each by Does not apply route 3 (three) times daily.       Allergies  Allergen Reactions  . Codeine Rash    Consultations:  Urology  IR   Procedures/Studies: CT ABDOMEN PELVIS W WO CONTRAST  Result Date: 08/13/2020 CLINICAL DATA:  Complicated pyelonephritis.  Abscess. EXAM: CT ABDOMEN AND PELVIS WITHOUT AND WITH CONTRAST TECHNIQUE: Multidetector CT imaging of the abdomen and pelvis was performed following the standard protocol before and following the bolus administration of intravenous contrast. CONTRAST:  184mL OMNIPAQUE IOHEXOL 300 MG/ML  SOLN COMPARISON:  08/08/2020 FINDINGS:  Lower Chest: Small bilateral pleural effusions are again seen with atelectasis or consolidation in the posterior lower lobes. Multiple small pulmonary nodules are again seen in the visualized portion of the right lower lung, without significant change. Hepatobiliary: No hepatic masses identified. Gallbladder is unremarkable. No evidence of biliary ductal dilatation. Pancreas:  No mass or inflammatory changes. Spleen: Within normal limits in size and appearance. Adrenals/Urinary Tract: A left percutaneous nephrostomy tube is seen in appropriate position and there is no evidence of hydronephrosis. Diffuse left renal swelling is again seen with multiple small irregular fluid density collections throughout the renal parenchyma, and a perinephric fluid and gas collection is again seen along the lateral and posterior margins of the kidney. This measures 9.4 x 4.3 cm on image 29/7, increased from 7.4 x 2.5 cm when remeasured in same planes on prior exam. A 2nd percutaneous drainage catheter is seen in the left lower quadrant along the anterior aspect of the left psoas muscle. Left retroperitoneal fluid collection in this region measures 8.7 x 4.4 cm on image 54/2, without significant change. A 3rd fluid and gas collection is seen in the left pararenal space which measures 6.7 x 2.7 cm on image 46/7, without significant change in size since prior exam. Foley catheter is noted within the urinary bladder. Stomach/Bowel: No evidence of obstruction, inflammatory process or abnormal fluid collections. Vascular/Lymphatic: No pathologically enlarged lymph nodes. No abdominal aortic aneurysm. Reproductive:  No mass or other significant abnormality. Other:  None. Musculoskeletal:  No suspicious bone lesions identified. IMPRESSION: Left percutaneous nephrostomy tube in appropriate position. No evidence of hydronephrosis. Stable diffuse left renal parenchymal swelling with multiple small internal fluid density collections. Mild  increase in size of left perinephric fluid and gas collection. No significant change in size of fluid and gas collections along the anterior aspect of left psoas muscle, and in the left lateral pararenal space. Stable small bilateral pleural effusions and posterior lower lobe atelectasis  or consolidation. Stable small right lower lung pulmonary nodules. Electronically Signed   By: Danae Orleans M.D.   On: 08/13/2020 08:27   DG Chest 2 View  Result Date: 08/31/2020 CLINICAL DATA:  Shortness of breath. Possible PICC line infection. On antibiotics. EXAM: CHEST - 2 VIEW COMPARISON:  08/07/2020 and CT of 08/08/2020. FINDINGS: Left upper quadrant percutaneous drains. A right-sided PICC line terminates at the low SVC. Midline trachea. Mild cardiomegaly. Trace right pleural effusion. No pneumothorax. The dominant right upper lobe septic embolus measuring 2.4 cm today versus 3.1 cm on 08/07/2020 (when remeasured). The underlying pattern of diffuse reticulonodular opacification is difficult to directly compare to the prior AP radiograph, but may be slightly improved. No lobar consolidation. Mild left base subsegmental atelectasis. IMPRESSION: Reticulonodular opacification throughout both lungs, consistent with septic emboli when compared to 08/08/2020 CT. This may be slightly improved compared to a on a AP radiographs most recent 08/07/2020. The right upper lobe dominant lesion is slightly decreased in size. Trace right pleural effusion. Electronically Signed   By: Jeronimo Greaves M.D.   On: 08/31/2020 18:42   DG Chest 2 View  Result Date: 08/07/2020 CLINICAL DATA:  Sepsis.  Left pyelonephritis. EXAM: CHEST - 2 VIEW COMPARISON:  08/04/2020 FINDINGS: Stable cardiomegaly. Small left pleural effusion and left basilar atelectasis or infiltrate show no significant change. Probable small layering right pleural effusion also noted. Increased airspace opacity is seen in the right upper lobe since prior study. IMPRESSION:  Increased right upper lobe airspace opacity. Stable small left pleural effusion, and left basilar atelectasis versus infiltrate. Probable small layering right pleural effusion. Electronically Signed   By: Danae Orleans M.D.   On: 08/07/2020 12:54   CT ABDOMEN PELVIS W CONTRAST  Result Date: 09/01/2020 CLINICAL DATA:  Abdominal pain, fever EXAM: CT ABDOMEN AND PELVIS WITH CONTRAST TECHNIQUE: Multidetector CT imaging of the abdomen and pelvis was performed using the standard protocol following bolus administration of intravenous contrast. CONTRAST:  OMNIPAQUE IOHEXOL 300 MG/ML  SOLN COMPARISON:  08/16/2020, 08/13/2020 FINDINGS: Lower chest: Subpleural nodule within the visualized right lung base and atelectasis within the left lung base are unchanged. Previously noted pleural effusions have resolved. The visualized heart and pericardium are unremarkable. Hepatobiliary: No focal liver abnormality is seen. No gallstones, gallbladder wall thickening, or biliary dilatation. Pancreas: Unremarkable Spleen: Unremarkable Adrenals/Urinary Tract: The adrenal glands are unremarkable. The kidneys are normal in size and position. The right kidney is unremarkable. Percutaneous nephrostomy is seen within the left kidney, unchanged with its retaining loop formed within the left renal pelvis. Percutaneous pigtail drainage catheter is seen within the a posterior perinephric fluid collection, but has been partially withdrawn with its retaining loop now just within the loculated collection. The collection is slightly improved in overall size, now measuring 3.9 x 8.6 by 13.2 cm in greatest dimension on axial image # 29 and sagittal reformat # 105. Punctate foci of gas are again seen within the collection, though this appears reduced when compared to prior examination. A second collection is seen within the posterior left pararenal space. This U shaped collection measures roughly 11.2 x 9.7 cm at its base in the region of the  percutaneous drainage catheter, axial image # 52. Superolaterally, this measures 4.2 x 11.1 cm at axial image # 47. These appear unchanged from prior examination, though appear improved when compared to remote prior examination of 08/08/2020. Pigtail drainage catheter within the basilar component appears unchanged in position. Stomach/Bowel: Stomach, small bowel, and large bowel  are unremarkable. Appendix normal. No free intraperitoneal gas or fluid. Vascular/Lymphatic: The abdominal vasculature is unremarkable. Shotty left periaortic lymphadenopathy likely represents reactive adenopathy. No frankly pathologic adenopathy within the abdomen and pelvis. Reproductive: Uterus and bilateral adnexa are unremarkable. Other: No abdominal wall hernia.  Rectum unremarkable. Musculoskeletal: No acute bone abnormality. No lytic or blastic bone lesions are seen. IMPRESSION: Left perinephric abscess has slightly decreased in size when compared to prior examination. Percutaneous drainage catheter is seen within the collection, though has been partially withdrawn when compared to prior exam. Left posterior pararenal U shaped abscess appears unchanged when compared to prior examination. Percutaneous drainage catheter appears appropriately position within the dependent component of the collection, unchanged from prior examination. Left percutaneous nephrostomy in appropriate position. No hydronephrosis. Resolved bilateral pleural effusions. Stable right subpleural pulmonary nodule, likely inflammatory in nature. Electronically Signed   By: Fidela Salisbury MD   On: 09/01/2020 01:58   IR Catheter Tube Change  Result Date: 09/04/2020 INDICATION: Left urolithiasis, emphysematous pyelonephritis, with retroperitoneal abscess, status post placement of left nephrostomy catheter, left perinephric drain, and left retroperitoneal abscess. Poor resolution of the retroperitoneal fluid collections. EXAM: RETROPERITONEAL DRAIN CATHETER EXCHANGE  X2 UNDER FLUOROSCOPY MEDICATIONS: No periprocedural antibiotics were indicated ANESTHESIA/SEDATION: Intravenous Fentanyl 167mcg and Versed $RemoveBe'2mg'DajOsFaHg$  were administered as conscious sedation during continuous monitoring of the patient's level of consciousness and physiological / cardiorespiratory status by the radiology RN, with a total moderate sedation time of 10 minutes. PROCEDURE: Informed written consent was obtained from the patient after a thorough discussion of the procedural risks, benefits and alternatives. All questions were addressed. Maximal Sterile Barrier Technique was utilized including caps, mask, sterile gowns, sterile gloves, sterile drape, hand hygiene and skin antiseptic. A timeout was performed prior to the initiation of the procedure. Small amount of contrast was injected through the nephrostomy catheter could firm in its location. Small amount of contrast was injected through the perinephric catheter, which was then cut and exchanged over short Amplatz wire for a new 14 French pigtail catheter, formed centrally within residual collection. Approximately 10 mL of purulent material were aspirated. In similar fashion, a small amount of contrast was injected through the left retroperitoneal catheter, which was then cut and exchanged over short Amplatz wire for a new 14 French pigtail catheter, formed centrally within the residual collection. 150 mL of thin purulent material were aspirated. Both catheters were secured externally with 0- Prolene suture and StatLocks, placed to gravity drain bags. The patient tolerated the procedure well. COMPLICATIONS: None immediate. IMPRESSION: 1. Technically successful exchange, upsize, and repositioning of left perinephric abscess drain catheter using 14 French device. 2. Technically successful exchange and upsizing of left retroperitoneal abscess drain catheter, using 14 [place] device. Electronically Signed   By: Lucrezia Europe M.D.   On: 09/04/2020 15:26   IR Catheter  Tube Change  Result Date: 09/04/2020 INDICATION: Left urolithiasis, emphysematous pyelonephritis, with retroperitoneal abscess, status post placement of left nephrostomy catheter, left perinephric drain, and left retroperitoneal abscess. Poor resolution of the retroperitoneal fluid collections. EXAM: RETROPERITONEAL DRAIN CATHETER EXCHANGE X2 UNDER FLUOROSCOPY MEDICATIONS: No periprocedural antibiotics were indicated ANESTHESIA/SEDATION: Intravenous Fentanyl 195mcg and Versed $RemoveBe'2mg'xddEXdezz$  were administered as conscious sedation during continuous monitoring of the patient's level of consciousness and physiological / cardiorespiratory status by the radiology RN, with a total moderate sedation time of 10 minutes. PROCEDURE: Informed written consent was obtained from the patient after a thorough discussion of the procedural risks, benefits and alternatives. All questions were addressed. Maximal Sterile Barrier  Technique was utilized including caps, mask, sterile gowns, sterile gloves, sterile drape, hand hygiene and skin antiseptic. A timeout was performed prior to the initiation of the procedure. Small amount of contrast was injected through the nephrostomy catheter could firm in its location. Small amount of contrast was injected through the perinephric catheter, which was then cut and exchanged over short Amplatz wire for a new 14 French pigtail catheter, formed centrally within residual collection. Approximately 10 mL of purulent material were aspirated. In similar fashion, a small amount of contrast was injected through the left retroperitoneal catheter, which was then cut and exchanged over short Amplatz wire for a new 14 French pigtail catheter, formed centrally within the residual collection. 150 mL of thin purulent material were aspirated. Both catheters were secured externally with 0- Prolene suture and StatLocks, placed to gravity drain bags. The patient tolerated the procedure well. COMPLICATIONS: None immediate.  IMPRESSION: 1. Technically successful exchange, upsize, and repositioning of left perinephric abscess drain catheter using 14 French device. 2. Technically successful exchange and upsizing of left retroperitoneal abscess drain catheter, using 14 [place] device. Electronically Signed   By: Lucrezia Europe M.D.   On: 09/04/2020 15:26   CT CHEST ABDOMEN PELVIS W CONTRAST  Result Date: 08/08/2020 CLINICAL DATA:  60 year old female with history of complicated pyelonephritis. EXAM: CT CHEST, ABDOMEN, AND PELVIS WITH CONTRAST TECHNIQUE: Multidetector CT imaging of the chest, abdomen and pelvis was performed following the standard protocol during bolus administration of intravenous contrast. CONTRAST:  174mL OMNIPAQUE IOHEXOL 300 MG/ML  SOLN COMPARISON:  CT the chest, abdomen and pelvis 08/01/2020. FINDINGS: CT CHEST FINDINGS Cardiovascular: Heart size is normal. There is no significant pericardial fluid, thickening or pericardial calcification. There is aortic atherosclerosis, as well as atherosclerosis of the great vessels of the mediastinum and the coronary arteries, including calcified atherosclerotic plaque in the left anterior descending coronary artery. Dilatation of the pulmonic trunk (3.6 cm in diameter). Mediastinum/Nodes: No pathologically enlarged in mediastinal or hilar lymph nodes. Esophagus is unremarkable in appearance. No axillary lymphadenopathy. Lungs/Pleura: Numerous pulmonary nodules and masses are again noted throughout the lungs bilaterally, many of which have central areas of cavitation. The largest of these is in the posterior aspect of the right upper lobe (axial image 54 of series 5) measuring 3.2 x 3.2 cm, with central low attenuation regions and internal cavitation, compatible with a large intrapulmonary abscess. Overall, findings are strongly suggestive of widespread septic emboli. Moderate bilateral pleural effusions lying dependently with extensive passive atelectasis in the lower lobes of the  lungs bilaterally. Musculoskeletal: There are no aggressive appearing lytic or blastic lesions noted in the visualized portions of the skeleton. CT ABDOMEN PELVIS FINDINGS Hepatobiliary: No suspicious cystic or solid hepatic lesions. No intra or extrahepatic biliary ductal dilatation. Gallbladder is normal in appearance. Pancreas: No pancreatic mass. No pancreatic ductal dilatation. No peripancreatic fluid collections or inflammatory changes. Spleen: Unremarkable. Adrenals/Urinary Tract: New nephrostomy tube extends into the left renal pelvis and appears appropriately located. The left kidneys enlarged and very heterogeneous in appearance with multiple hypovascular hypoenhancing regions, compatible with residual areas of pyelonephritis and associated renal and perinephric abscesses. These extend into the perinephric space where there is a combination of gas and fluid. Some of this tracks caudally where there is a larger collection of predominantly fluid, trace amount of gas, and some higher attenuation material adjacent to the tip of an indwelling drain (best appreciated on axial image 89 of series 2 and coronal image 74 of series 6) which  measures approximately 4.3 x 2.1 x 3.6 cm. This perinephric fluid collection trucks caudally into the left hemipelvis along the left pelvic sidewall, coming into contact with the left iliacus musculature. Overall, the extent of fluid in this collection appears increased. This is well demonstrated in a relatively well-defined portion of the collection laterally which currently measures 9.5 x 3.6 cm (axial image 78 of series 2) and previously measured only 8.2 x 2.4 cm when measured in a similar fashion on 08/01/2020. Right kidney and bilateral adrenal glands are normal in appearance. No right hydroureteronephrosis. Urinary bladder is completely decompressed around an indwelling Foley balloon catheter. Small amount of gas present in the lumen of the urinary bladder is presumably  iatrogenic. Stomach/Bowel: The appearance of the stomach is normal. There is no pathologic dilatation of small bowel or colon. Normal appendix. Vascular/Lymphatic: Aortic atherosclerosis, without evidence of aneurysm or dissection in the abdominal or pelvic vasculature. Filling defect in the left renal vein best appreciated on coronal image 77 of series 6, compatible with nonocclusive left renal vein thrombus. No lymphadenopathy noted in the abdomen or pelvis. Reproductive: Uterus and ovaries are unremarkable in appearance. Other: Diffuse body wall edema. Trace volume of ascites. No pneumoperitoneum. Musculoskeletal: There are no aggressive appearing lytic or blastic lesions noted in the visualized portions of the skeleton. IMPRESSION: 1. Compared to the prior study there is been interval nephrostomy tube placement which appears appropriately located, as well as percutaneous drainage catheter placement in the inferior aspect of the patient's extensive perinephric abscess. Unfortunately, the overall size of the abscess appears increased compared to the prior examination, and there is new high attenuation fluid within the inferior aspect of this collection, which could represent some recent hemorrhage. If there is concern for active hemorrhage, further evaluation with triple-phase CT of the abdomen and pelvis should be considered to evaluate for source of active bleeding. 2. Numerous large pulmonary nodules and masses in the lungs bilaterally, with imaging characteristics most compatible with extensive septic emboli with multifocal pulmonary abscess formation. 3. 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 This likely represents the source of emboli in this patient with evidence of septic embolization. 4. Moderate bilateral pleural effusions lying dependently with associated areas of passive atelectasis in the lower lobes of the lungs  bilaterally. 5. Dilatation of the pulmonic trunk (3.6 cm in diameter), concerning for pulmonary arterial hypertension. 6. Additional incidental findings, as above. These results were called by telephone at the time of interpretation on 08/08/2020 at 5:38 pm to provider [facility], who verbally acknowledged these results. Electronically Signed   By: Vinnie Langton M.D.   On: 08/08/2020 17:38   DG Abd 2 Views  Result Date: 08/08/2020 CLINICAL DATA:  Abdominal pain and decreased oral intake. EXAM: ABDOMEN - 2 VIEW COMPARISON:  Plain film of the abdomen dated 08/07/2020. FINDINGS: Mildly distended small bowel within the central abdomen with nonspecific air-fluid levels in the upper pelvis. Air is again seen within the colon. No evidence of renal or ureteral calculi. Two percutaneous pigtail catheters are again seen within the LEFT abdomen. Dense consolidation the LEFT lung base, presumed pneumonia. IMPRESSION: 1. Mildly distended small bowel within the central abdomen with nonspecific air-fluid levels in the upper pelvis. Findings could represent ileus or partial small bowel obstruction. 2. LEFT lower lung consolidation, pneumonia versus atelectasis. Electronically Signed   By: Franki Cabot M.D.   On: 08/08/2020 10:04   DG Abd 2 Views  Result Date:  08/07/2020 CLINICAL DATA:  Sepsis.  Left pyelonephritis. EXAM: ABDOMEN - 2 VIEW COMPARISON:  None. FINDINGS: Mild gaseous distention of colon is seen predominately involving the nondependent sigmoid colon. Scattered air-fluid levels are seen, however there is no evidence of dilated small bowel loops. These findings are likely due to mild ileus. No radiopaque urinary calculi identified. Two percutaneous pigtail catheters are seen in the left upper and lower quadrants. IMPRESSION: Probable mild adynamic ileus. Two percutaneous pigtail catheters in left upper and lower quadrants. Electronically Signed   By: Marlaine Hind M.D.   On: 08/07/2020 12:51   CT RENAL  STONE STUDY  Result Date: 08/16/2020 CLINICAL DATA:  60 year old female with pyelonephritis and renal abscess. Follow-up CT. EXAM: CT ABDOMEN AND PELVIS WITHOUT CONTRAST TECHNIQUE: Multidetector CT imaging of the abdomen and pelvis was performed following the standard protocol without IV contrast. COMPARISON:  CT abdomen pelvis dated 08/13/2020. FINDINGS: Evaluation of this exam is limited in the absence of intravenous contrast. Lower chest: Partially visualized small bilateral pleural effusions and consolidative changes of the lung bases as seen previously. Several pulmonary nodules measure 7 mm in the right middle lobe. No intra-abdominal free air or free fluid. Hepatobiliary: No focal liver abnormality is seen. No gallstones, gallbladder wall thickening, or biliary dilatation. Pancreas: Unremarkable. No pancreatic ductal dilatation or surrounding inflammatory changes. Spleen: Normal in size without focal abnormality. Adrenals/Urinary Tract: The adrenal glands unremarkable. Left percutaneous nephrostomy with pigtail tip in the left renal pelvis in similar position. There has been interval placement a percutaneous drainage catheter with pigtail tip in the posterior perinephric collection. Slight interval decrease in the size of the perinephric collection compared to prior CT. There is no hydronephrosis or nephrolithiasis on either side. The right kidney is unremarkable. The urinary bladder is decompressed around a Foley catheter. Stomach/Bowel: There is no bowel obstruction or active inflammation. The appendix is not visualized with certainty. No inflammatory changes identified in the right lower quadrant. Vascular/Lymphatic: Mild aortoiliac atherosclerotic disease. The IVC is unremarkable. No portal venous gas. There is no adenopathy. Reproductive: The uterus and ovaries are grossly unremarkable. Other: Mild diffuse subcutaneous edema. There has been interval placement of a percutaneous drainage catheter with tip  in the fluid collection along the anterior aspect of the left psoas muscle. The collection has decreased in size now measuring approximately 4.2 x 8.0 cm (previously 4.4 x 8.7 cm). Musculoskeletal: No acute or significant osseous findings. IMPRESSION: 1. Interval placement of a percutaneous drainage catheter with tip in the posterior perinephric collection. Slight interval decrease in the size of the fluid collection compared to prior CT. 2. Decrease in the size of the collection anterior to the left psoas muscle status post percutaneous drainage catheter placement. Near 3. No hydronephrosis or nephrolithiasis. 4. No bowel obstruction. 5. Several pulmonary nodules measure 7 mm in the right middle lobe. 6. Aortic Atherosclerosis (ICD10-I70.0). Electronically Signed   By: Anner Crete M.D.   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 EXAM: CT IMAGE GUIDED DRAINAGE BY PERCUTANEOUS CATHETER COMPARISON:  None. MEDICATIONS: The patient is currently admitted to the hospital and receiving intravenous antibiotics. The antibiotics were administered within an appropriate time frame prior to the initiation of the procedure. ANESTHESIA/SEDATION: Moderate (conscious) sedation was employed during this procedure. A total of Versed 2 mg and Fentanyl  100 mcg was administered intravenously. Moderate Sedation Time: 20 minutes. The patient's level of consciousness and vital signs were monitored continuously by radiology nursing throughout the procedure under my direct supervision. CONTRAST:  None COMPLICATIONS: None immediate. PROCEDURE: Informed written consent was obtained from the patient after a discussion of the risks, benefits and alternatives to treatment. The  patient was placed in a partial right lateral decubitus position on the CT gantry and a pre procedural CT was performed re-demonstrating the known abscess/fluid collection within the left posterior perinephric space. The procedure was planned. A timeout was performed prior to the initiation of the procedure. The left flank was prepped and draped in the usual sterile fashion. The overlying soft tissues were anesthetized with 1% lidocaine with epinephrine. Appropriate trajectory was planned with the use of a 22 gauge spinal needle. An 18 gauge trocar needle was advanced into the abscess/fluid collection and a short Amplatz super stiff wire was coiled within the collection. Appropriate positioning was confirmed with a limited CT scan. The tract was serially dilated allowing placement of a 12 [place] all-purpose drainage catheter. Appropriate positioning was confirmed with a limited postprocedural CT scan. Approximately 100 ml of purulent fluid was aspirated. The tube was connected to a bulb suction and sutured in place. A dressing was placed. The patient tolerated the procedure well without immediate post procedural complication. IMPRESSION: Successful CT guided placement of a 87 French all purpose drain catheter into the left posterior perinephric abscess with aspiration of 100 mL of purulent fluid. Samples were sent to the laboratory as requested by the ordering clinical team. Ruthann Cancer, MD [facility] Radiology Electronically Signed   By: Ruthann Cancer MD   On: 08/14/2020 15:01   [place] EKG SITE RITE  Result Date: 08/09/2020 If Site Rite image not attached, placement could not be confirmed due to current cardiac rhythm.       Discharge Exam: Vitals:   09/04/20 2036 09/05/20 0517  BP: 122/79 (!) 166/83  Pulse: 100 86  Resp: 20 18  Temp: 99 F (37.2 C) 98.4 F (36.9 C)  SpO2: 95% 98%   Vitals:   09/04/20 1205 09/04/20 1600 09/04/20 2036 09/05/20 0517   BP:  130/64 122/79 (!) 166/83  Pulse: 91 92 100 86  Resp: $Remo'20 18 20 18  'RsdZp$ Temp:  98.4 F (36.9 C) 99 F (37.2 C) 98.4 F (36.9 C)  TempSrc:  Oral Oral   SpO2: 98% 95% 95% 98%  Weight:      Height:        General: Pt is alert, awake, not in acute distress Cardiovascular: RRR, S1/S2 +, no rubs, no gallops Respiratory: CTA bilaterally, no wheezing, no rhonchi Abdominal: Soft, NT, ND, bowel sounds + Extremities: no edema, no cyanosis   The results of significant diagnostics from this hospitalization (including imaging, microbiology, ancillary and laboratory) are listed below for reference.    Significant Diagnostic Studies: CT ABDOMEN PELVIS W WO CONTRAST  Result Date: 08/13/2020 CLINICAL DATA:  Complicated pyelonephritis.  Abscess. EXAM: CT ABDOMEN AND PELVIS WITHOUT AND WITH CONTRAST TECHNIQUE: Multidetector CT imaging of the abdomen and pelvis was performed following the standard protocol before and following the bolus administration of intravenous contrast. CONTRAST:  137mL OMNIPAQUE IOHEXOL 300 MG/ML  SOLN COMPARISON:  08/08/2020 FINDINGS: Lower Chest: Small bilateral pleural effusions are again seen with atelectasis or consolidation in the posterior lower lobes. Multiple small pulmonary nodules are again seen in the visualized portion of the right lower lung,  without significant change. Hepatobiliary: No hepatic masses identified. Gallbladder is unremarkable. No evidence of biliary ductal dilatation. Pancreas:  No mass or inflammatory changes. Spleen: Within normal limits in size and appearance. Adrenals/Urinary Tract: A left percutaneous nephrostomy tube is seen in appropriate position and there is no evidence of hydronephrosis. Diffuse left renal swelling is again seen with multiple small irregular fluid density collections throughout the renal parenchyma, and a perinephric fluid and gas collection is again seen along the lateral and posterior margins of the kidney. This measures 9.4 x 4.3  cm on image 29/7, increased from 7.4 x 2.5 cm when remeasured in same planes on prior exam. A 2nd percutaneous drainage catheter is seen in the left lower quadrant along the anterior aspect of the left psoas muscle. Left retroperitoneal fluid collection in this region measures 8.7 x 4.4 cm on image 54/2, without significant change. A 3rd fluid and gas collection is seen in the left pararenal space which measures 6.7 x 2.7 cm on image 46/7, without significant change in size since prior exam. Foley catheter is noted within the urinary bladder. Stomach/Bowel: No evidence of obstruction, inflammatory process or abnormal fluid collections. Vascular/Lymphatic: No pathologically enlarged lymph nodes. No abdominal aortic aneurysm. Reproductive:  No mass or other significant abnormality. Other:  None. Musculoskeletal:  No suspicious bone lesions identified. IMPRESSION: Left percutaneous nephrostomy tube in appropriate position. No evidence of hydronephrosis. Stable diffuse left renal parenchymal swelling with multiple small internal fluid density collections. Mild increase in size of left perinephric fluid and gas collection. No significant change in size of fluid and gas collections along the anterior aspect of left psoas muscle, and in the left lateral pararenal space. Stable small bilateral pleural effusions and posterior lower lobe atelectasis or consolidation. Stable small right lower lung pulmonary nodules. Electronically Signed   By: Marlaine Hind M.D.   On: 08/13/2020 08:27   DG Chest 2 View  Result Date: 08/31/2020 CLINICAL DATA:  Shortness of breath. Possible PICC line infection. On antibiotics. EXAM: CHEST - 2 VIEW COMPARISON:  08/07/2020 and CT of 08/08/2020. FINDINGS: Left upper quadrant percutaneous drains. A right-sided PICC line terminates at the low SVC. Midline trachea. Mild cardiomegaly. Trace right pleural effusion. No pneumothorax. The dominant right upper lobe septic embolus measuring 2.4 cm today  versus 3.1 cm on 08/07/2020 (when remeasured). The underlying pattern of diffuse reticulonodular opacification is difficult to directly compare to the prior AP radiograph, but may be slightly improved. No lobar consolidation. Mild left base subsegmental atelectasis. IMPRESSION: Reticulonodular opacification throughout both lungs, consistent with septic emboli when compared to 08/08/2020 CT. This may be slightly improved compared to a on a AP radiographs most recent 08/07/2020. The right upper lobe dominant lesion is slightly decreased in size. Trace right pleural effusion. Electronically Signed   By: Abigail Miyamoto M.D.   On: 08/31/2020 18:42   DG Chest 2 View  Result Date: 08/07/2020 CLINICAL DATA:  Sepsis.  Left pyelonephritis. EXAM: CHEST - 2 VIEW COMPARISON:  08/04/2020 FINDINGS: Stable cardiomegaly. Small left pleural effusion and left basilar atelectasis or infiltrate show no significant change. Probable small layering right pleural effusion also noted. Increased airspace opacity is seen in the right upper lobe since prior study. IMPRESSION: Increased right upper lobe airspace opacity. Stable small left pleural effusion, and left basilar atelectasis versus infiltrate. Probable small layering right pleural effusion. Electronically Signed   By: Marlaine Hind M.D.   On: 08/07/2020 12:54   CT ABDOMEN PELVIS W CONTRAST  Result Date:  09/01/2020 CLINICAL DATA:  Abdominal pain, fever EXAM: CT ABDOMEN AND PELVIS WITH CONTRAST TECHNIQUE: Multidetector CT imaging of the abdomen and pelvis was performed using the standard protocol following bolus administration of intravenous contrast. CONTRAST:  12mL OMNIPAQUE IOHEXOL 300 MG/ML  SOLN COMPARISON:  08/16/2020, 08/13/2020 FINDINGS: Lower chest: Subpleural nodule within the visualized right lung base and atelectasis within the left lung base are unchanged. Previously noted pleural effusions have resolved. The visualized heart and pericardium are unremarkable.  Hepatobiliary: No focal liver abnormality is seen. No gallstones, gallbladder wall thickening, or biliary dilatation. Pancreas: Unremarkable Spleen: Unremarkable Adrenals/Urinary Tract: The adrenal glands are unremarkable. The kidneys are normal in size and position. The right kidney is unremarkable. Percutaneous nephrostomy is seen within the left kidney, unchanged with its retaining loop formed within the left renal pelvis. Percutaneous pigtail drainage catheter is seen within the a posterior perinephric fluid collection, but has been partially withdrawn with its retaining loop now just within the loculated collection. The collection is slightly improved in overall size, now measuring 3.9 x 8.6 by 13.2 cm in greatest dimension on axial image # 29 and sagittal reformat # 105. Punctate foci of gas are again seen within the collection, though this appears reduced when compared to prior examination. A second collection is seen within the posterior left pararenal space. This U shaped collection measures roughly 11.2 x 9.7 cm at its base in the region of the percutaneous drainage catheter, axial image # 52. Superolaterally, this measures 4.2 x 11.1 cm at axial image # 47. These appear unchanged from prior examination, though appear improved when compared to remote prior examination of 08/08/2020. Pigtail drainage catheter within the basilar component appears unchanged in position. Stomach/Bowel: Stomach, small bowel, and large bowel are unremarkable. Appendix normal. No free intraperitoneal gas or fluid. Vascular/Lymphatic: The abdominal vasculature is unremarkable. Shotty left periaortic lymphadenopathy likely represents reactive adenopathy. No frankly pathologic adenopathy within the abdomen and pelvis. Reproductive: Uterus and bilateral adnexa are unremarkable. Other: No abdominal wall hernia.  Rectum unremarkable. Musculoskeletal: No acute bone abnormality. No lytic or blastic bone lesions are seen. IMPRESSION: Left  perinephric abscess has slightly decreased in size when compared to prior examination. Percutaneous drainage catheter is seen within the collection, though has been partially withdrawn when compared to prior exam. Left posterior pararenal U shaped abscess appears unchanged when compared to prior examination. Percutaneous drainage catheter appears appropriately position within the dependent component of the collection, unchanged from prior examination. Left percutaneous nephrostomy in appropriate position. No hydronephrosis. Resolved bilateral pleural effusions. Stable right subpleural pulmonary nodule, likely inflammatory in nature. Electronically Signed   By: Fidela Salisbury MD   On: 09/01/2020 01:58   IR Catheter Tube Change  Result Date: 09/04/2020 INDICATION: Left urolithiasis, emphysematous pyelonephritis, with retroperitoneal abscess, status post placement of left nephrostomy catheter, left perinephric drain, and left retroperitoneal abscess. Poor resolution of the retroperitoneal fluid collections. EXAM: RETROPERITONEAL DRAIN CATHETER EXCHANGE X2 UNDER FLUOROSCOPY MEDICATIONS: No periprocedural antibiotics were indicated ANESTHESIA/SEDATION: Intravenous Fentanyl 178mcg and Versed $RemoveBe'2mg'rVAHrjPru$  were administered as conscious sedation during continuous monitoring of the patient's level of consciousness and physiological / cardiorespiratory status by the radiology RN, with a total moderate sedation time of 10 minutes. PROCEDURE: Informed written consent was obtained from the patient after a thorough discussion of the procedural risks, benefits and alternatives. All questions were addressed. Maximal Sterile Barrier Technique was utilized including caps, mask, sterile gowns, sterile gloves, sterile drape, hand hygiene and skin antiseptic. A timeout was performed prior to  the initiation of the procedure. Small amount of contrast was injected through the nephrostomy catheter could firm in its location. Small amount of  contrast was injected through the perinephric catheter, which was then cut and exchanged over short Amplatz wire for a new 14 French pigtail catheter, formed centrally within residual collection. Approximately 10 mL of purulent material were aspirated. In similar fashion, a small amount of contrast was injected through the left retroperitoneal catheter, which was then cut and exchanged over short Amplatz wire for a new 14 French pigtail catheter, formed centrally within the residual collection. 150 mL of thin purulent material were aspirated. Both catheters were secured externally with 0- Prolene suture and StatLocks, placed to gravity drain bags. The patient tolerated the procedure well. COMPLICATIONS: None immediate. IMPRESSION: 1. Technically successful exchange, upsize, and repositioning of left perinephric abscess drain catheter using 14 French device. 2. Technically successful exchange and upsizing of left retroperitoneal abscess drain catheter, using 14 [place] device. Electronically Signed   By: Lucrezia Europe M.D.   On: 09/04/2020 15:26   IR Catheter Tube Change  Result Date: 09/04/2020 INDICATION: Left urolithiasis, emphysematous pyelonephritis, with retroperitoneal abscess, status post placement of left nephrostomy catheter, left perinephric drain, and left retroperitoneal abscess. Poor resolution of the retroperitoneal fluid collections. EXAM: RETROPERITONEAL DRAIN CATHETER EXCHANGE X2 UNDER FLUOROSCOPY MEDICATIONS: No periprocedural antibiotics were indicated ANESTHESIA/SEDATION: Intravenous Fentanyl 141mcg and Versed $RemoveBe'2mg'[id number]$  were administered as conscious sedation during continuous monitoring of the patient's level of consciousness and physiological / cardiorespiratory status by the radiology RN, with a total moderate sedation time of 10 minutes. PROCEDURE: Informed written consent was obtained from the patient after a thorough discussion of the procedural risks, benefits and alternatives. All questions  were addressed. Maximal Sterile Barrier Technique was utilized including caps, mask, sterile gowns, sterile gloves, sterile drape, hand hygiene and skin antiseptic. A timeout was performed prior to the initiation of the procedure. Small amount of contrast was injected through the nephrostomy catheter could firm in its location. Small amount of contrast was injected through the perinephric catheter, which was then cut and exchanged over short Amplatz wire for a new 14 French pigtail catheter, formed centrally within residual collection. Approximately 10 mL of purulent material were aspirated. In similar fashion, a small amount of contrast was injected through the left retroperitoneal catheter, which was then cut and exchanged over short Amplatz wire for a new 14 French pigtail catheter, formed centrally within the residual collection. 150 mL of thin purulent material were aspirated. Both catheters were secured externally with 0- Prolene suture and StatLocks, placed to gravity drain bags. The patient tolerated the procedure well. COMPLICATIONS: None immediate. IMPRESSION: 1. Technically successful exchange, upsize, and repositioning of left perinephric abscess drain catheter using 14 French device. 2. Technically successful exchange and upsizing of left retroperitoneal abscess drain catheter, using 14 [place] device. Electronically Signed   By: Lucrezia Europe M.D.   On: 09/04/2020 15:26   CT CHEST ABDOMEN PELVIS W CONTRAST  Result Date: 08/08/2020 CLINICAL DATA:  60 year old female with history of complicated pyelonephritis. EXAM: CT CHEST, ABDOMEN, AND PELVIS WITH CONTRAST TECHNIQUE: Multidetector CT imaging of the chest, abdomen and pelvis was performed following the standard protocol during bolus administration of intravenous contrast. CONTRAST:  180mL OMNIPAQUE IOHEXOL 300 MG/ML  SOLN COMPARISON:  CT the chest, abdomen and pelvis 08/01/2020. FINDINGS: CT CHEST FINDINGS Cardiovascular: Heart size is normal. There  is no significant pericardial fluid, thickening or pericardial calcification. There is aortic atherosclerosis, as well as atherosclerosis  of the great vessels of the mediastinum and the coronary arteries, including calcified atherosclerotic plaque in the left anterior descending coronary artery. Dilatation of the pulmonic trunk (3.6 cm in diameter). Mediastinum/Nodes: No pathologically enlarged in mediastinal or hilar lymph nodes. Esophagus is unremarkable in appearance. No axillary lymphadenopathy. Lungs/Pleura: Numerous pulmonary nodules and masses are again noted throughout the lungs bilaterally, many of which have central areas of cavitation. The largest of these is in the posterior aspect of the right upper lobe (axial image 54 of series 5) measuring 3.2 x 3.2 cm, with central low attenuation regions and internal cavitation, compatible with a large intrapulmonary abscess. Overall, findings are strongly suggestive of widespread septic emboli. Moderate bilateral pleural effusions lying dependently with extensive passive atelectasis in the lower lobes of the lungs bilaterally. Musculoskeletal: There are no aggressive appearing lytic or blastic lesions noted in the visualized portions of the skeleton. CT ABDOMEN PELVIS FINDINGS Hepatobiliary: No suspicious cystic or solid hepatic lesions. No intra or extrahepatic biliary ductal dilatation. Gallbladder is normal in appearance. Pancreas: No pancreatic mass. No pancreatic ductal dilatation. No peripancreatic fluid collections or inflammatory changes. Spleen: Unremarkable. Adrenals/Urinary Tract: New nephrostomy tube extends into the left renal pelvis and appears appropriately located. The left kidneys enlarged and very heterogeneous in appearance with multiple hypovascular hypoenhancing regions, compatible with residual areas of pyelonephritis and associated renal and perinephric abscesses. These extend into the perinephric space where there is a combination of gas  and fluid. Some of this tracks caudally where there is a larger collection of predominantly fluid, trace amount of gas, and some higher attenuation material adjacent to the tip of an indwelling drain (best appreciated on axial image 89 of series 2 and coronal image 74 of series 6) which measures approximately 4.3 x 2.1 x 3.6 cm. This perinephric fluid collection trucks caudally into the left hemipelvis along the left pelvic sidewall, coming into contact with the left iliacus musculature. Overall, the extent of fluid in this collection appears increased. This is well demonstrated in a relatively well-defined portion of the collection laterally which currently measures 9.5 x 3.6 cm (axial image 78 of series 2) and previously measured only 8.2 x 2.4 cm when measured in a similar fashion on 08/01/2020. Right kidney and bilateral adrenal glands are normal in appearance. No right hydroureteronephrosis. Urinary bladder is completely decompressed around an indwelling Foley balloon catheter. Small amount of gas present in the lumen of the urinary bladder is presumably iatrogenic. Stomach/Bowel: The appearance of the stomach is normal. There is no pathologic dilatation of small bowel or colon. Normal appendix. Vascular/Lymphatic: Aortic atherosclerosis, without evidence of aneurysm or dissection in the abdominal or pelvic vasculature. Filling defect in the left renal vein best appreciated on coronal image 77 of series 6, compatible with nonocclusive left renal vein thrombus. No lymphadenopathy noted in the abdomen or pelvis. Reproductive: Uterus and ovaries are unremarkable in appearance. Other: Diffuse body wall edema. Trace volume of ascites. No pneumoperitoneum. Musculoskeletal: There are no aggressive appearing lytic or blastic lesions noted in the visualized portions of the skeleton. IMPRESSION: 1. Compared to the prior study there is been interval nephrostomy tube placement which appears appropriately located, as well  as percutaneous drainage catheter placement in the inferior aspect of the patient's extensive perinephric abscess. Unfortunately, the overall size of the abscess appears increased compared to the prior examination, and there is new high attenuation fluid within the inferior aspect of this collection, which could represent some recent hemorrhage. If there is concern for  active hemorrhage, further evaluation with triple-phase CT of the abdomen and pelvis should be considered to evaluate for source of active bleeding. 2. Numerous large pulmonary nodules and masses in the lungs bilaterally, with imaging characteristics most compatible with extensive septic emboli with multifocal pulmonary abscess formation. 3. 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 This likely represents the source of emboli in this patient with evidence of septic embolization. 4. Moderate bilateral pleural effusions lying dependently with associated areas of passive atelectasis in the lower lobes of the lungs bilaterally. 5. Dilatation of the pulmonic trunk (3.6 cm in diameter), concerning for pulmonary arterial hypertension. 6. Additional incidental findings, as above. These results were called by telephone at the time of interpretation on 08/08/2020 at 5:38 pm to provider [facility], who verbally acknowledged these results. Electronically Signed   By: Vinnie Langton M.D.   On: 08/08/2020 17:38   DG Abd 2 Views  Result Date: 08/08/2020 CLINICAL DATA:  Abdominal pain and decreased oral intake. EXAM: ABDOMEN - 2 VIEW COMPARISON:  Plain film of the abdomen dated 08/07/2020. FINDINGS: Mildly distended small bowel within the central abdomen with nonspecific air-fluid levels in the upper pelvis. Air is again seen within the colon. No evidence of renal or ureteral calculi. Two percutaneous pigtail catheters are again seen within the LEFT abdomen. Dense  consolidation the LEFT lung base, presumed pneumonia. IMPRESSION: 1. Mildly distended small bowel within the central abdomen with nonspecific air-fluid levels in the upper pelvis. Findings could represent ileus or partial small bowel obstruction. 2. LEFT lower lung consolidation, pneumonia versus atelectasis. Electronically Signed   By: Franki Cabot M.D.   On: 08/08/2020 10:04   DG Abd 2 Views  Result Date: 08/07/2020 CLINICAL DATA:  Sepsis.  Left pyelonephritis. EXAM: ABDOMEN - 2 VIEW COMPARISON:  None. FINDINGS: Mild gaseous distention of colon is seen predominately involving the nondependent sigmoid colon. Scattered air-fluid levels are seen, however there is no evidence of dilated small bowel loops. These findings are likely due to mild ileus. No radiopaque urinary calculi identified. Two percutaneous pigtail catheters are seen in the left upper and lower quadrants. IMPRESSION: Probable mild adynamic ileus. Two percutaneous pigtail catheters in left upper and lower quadrants. Electronically Signed   By: Marlaine Hind M.D.   On: 08/07/2020 12:51   CT RENAL STONE STUDY  Result Date: 08/16/2020 CLINICAL DATA:  60 year old female with pyelonephritis and renal abscess. Follow-up CT. EXAM: CT ABDOMEN AND PELVIS WITHOUT CONTRAST TECHNIQUE: Multidetector CT imaging of the abdomen and pelvis was performed following the standard protocol without IV contrast. COMPARISON:  CT abdomen pelvis dated 08/13/2020. FINDINGS: Evaluation of this exam is limited in the absence of intravenous contrast. Lower chest: Partially visualized small bilateral pleural effusions and consolidative changes of the lung bases as seen previously. Several pulmonary nodules measure 7 mm in the right middle lobe. No intra-abdominal free air or free fluid. Hepatobiliary: No focal liver abnormality is seen. No gallstones, gallbladder wall thickening, or biliary dilatation. Pancreas: Unremarkable. No pancreatic ductal dilatation or surrounding  inflammatory changes. Spleen: Normal in size without focal abnormality. Adrenals/Urinary Tract: The adrenal glands unremarkable. Left percutaneous nephrostomy with pigtail tip in the left renal pelvis in similar position. There has been interval placement a percutaneous drainage catheter with pigtail tip in the posterior perinephric collection. Slight interval decrease in the size of the perinephric collection compared to prior CT. There is no hydronephrosis or nephrolithiasis on either side. The right kidney is  unremarkable. The urinary bladder is decompressed around a Foley catheter. Stomach/Bowel: There is no bowel obstruction or active inflammation. The appendix is not visualized with certainty. No inflammatory changes identified in the right lower quadrant. Vascular/Lymphatic: Mild aortoiliac atherosclerotic disease. The IVC is unremarkable. No portal venous gas. There is no adenopathy. Reproductive: The uterus and ovaries are grossly unremarkable. Other: Mild diffuse subcutaneous edema. There has been interval placement of a percutaneous drainage catheter with tip in the fluid collection along the anterior aspect of the left psoas muscle. The collection has decreased in size now measuring approximately 4.2 x 8.0 cm (previously 4.4 x 8.7 cm). Musculoskeletal: No acute or significant osseous findings. IMPRESSION: 1. Interval placement of a percutaneous drainage catheter with tip in the posterior perinephric collection. Slight interval decrease in the size of the fluid collection compared to prior CT. 2. Decrease in the size of the collection anterior to the left psoas muscle status post percutaneous drainage catheter placement. Near 3. No hydronephrosis or nephrolithiasis. 4. No bowel obstruction. 5. Several pulmonary nodules measure 7 mm in the right middle lobe. 6. Aortic Atherosclerosis (ICD10-I70.0). Electronically Signed   By: Anner Crete M.D.   On: 08/16/2020 20:27   CT IMAGE GUIDED DRAINAGE BY  PERCUTANEOUS CATHETER  Result Date: 08/14/2020 INDICATION: 60 year old female with history of emphysematous pyelonephritis with persistent right perinephric fluid collections status post nephrostomy tube placement hand left retroperitoneal drain placement. EXAM: CT IMAGE GUIDED DRAINAGE BY PERCUTANEOUS CATHETER COMPARISON:  None. MEDICATIONS: The patient is currently admitted to the hospital and receiving intravenous antibiotics. The antibiotics were administered within an appropriate time frame prior to the initiation of the procedure. ANESTHESIA/SEDATION: Moderate (conscious) sedation was employed during this procedure. A total of Versed 2 mg and Fentanyl 100 mcg was administered intravenously. Moderate Sedation Time: 20 minutes. The patient's level of consciousness and vital signs were monitored continuously by radiology nursing throughout the procedure under my direct supervision. CONTRAST:  None COMPLICATIONS: None immediate. PROCEDURE: Informed written consent was obtained from the patient after a discussion of the risks, benefits and alternatives to treatment. The patient was placed in a partial right lateral decubitus position on the CT gantry and a pre procedural CT was performed re-demonstrating the known abscess/fluid collection within the left posterior perinephric space. The procedure was planned. A timeout was performed prior to the initiation of the procedure. The left flank was prepped and draped in the usual sterile fashion. The overlying soft tissues were anesthetized with 1% lidocaine with epinephrine. Appropriate trajectory was planned with the use of a 22 gauge spinal needle. An 18 gauge trocar needle was advanced into the abscess/fluid collection and a short Amplatz super stiff wire was coiled within the collection. Appropriate positioning was confirmed with a limited CT scan. The tract was serially dilated allowing placement of a 12 [place] all-purpose drainage catheter. Appropriate  positioning was confirmed with a limited postprocedural CT scan. Approximately 100 ml of purulent fluid was aspirated. The tube was connected to a bulb suction and sutured in place. A dressing was placed. The patient tolerated the procedure well without immediate post procedural complication. IMPRESSION: Successful CT guided placement of a 44 French all purpose drain catheter into the left posterior perinephric abscess with aspiration of 100 mL of purulent fluid. Samples were sent to the laboratory as requested by the ordering clinical team. Ruthann Cancer, MD [facility] Radiology Electronically Signed   By: Ruthann Cancer MD   On: 08/14/2020 15:01   [place]  EKG SITE RITE  Result Date: 08/09/2020 If Site Rite image not attached, placement could not be confirmed due to current cardiac rhythm.    Microbiology: Recent Results (from the past 240 hour(s))  Blood Culture (routine x 2)     Status: None   Collection Time: 08/31/20 11:15 PM   Specimen: BLOOD LEFT FOREARM  Result Value Ref Range Status   Specimen Description BLOOD LEFT FOREARM  Final   Special Requests   Final    BOTTLES DRAWN AEROBIC AND ANAEROBIC Blood Culture adequate volume   Culture   Final    NO GROWTH 5 DAYS Performed at [facility], [address], [place], [place] [address]    Report Status 09/05/2020 FINAL  Final  SARS Coronavirus 2 by RT PCR (hospital order, performed in [facility] hospital lab) Nasopharyngeal Nasopharyngeal Swab     Status: None   Collection Time: 08/31/20 11:37 PM   Specimen: Nasopharyngeal Swab  Result Value Ref Range Status   SARS Coronavirus 2 NEGATIVE NEGATIVE Final    Comment: (NOTE) SARS-CoV-2 target nucleic acids are NOT DETECTED.  The SARS-CoV-2 RNA is generally detectable in upper and lower respiratory specimens during the acute phase of infection. The lowest concentration of SARS-CoV-2 viral copies this assay can detect is 250 copies / mL. A  negative result does not preclude SARS-CoV-2 infection and should not be used as the sole basis for treatment or other patient management decisions.  A negative result may occur with improper specimen collection / handling, submission of specimen other than nasopharyngeal swab, presence of viral mutation(s) within the areas targeted by this assay, and inadequate number of viral copies (<250 copies / mL). A negative result must be combined with clinical observations, patient history, and epidemiological information.  Fact Sheet for Patients:   StrictlyIdeas.no  Fact Sheet for Healthcare Providers: BankingDealers.co.za  This test is not yet approved or  cleared by the [place] FDA and has been authorized for detection and/or diagnosis of SARS-CoV-2 by FDA under an Emergency Use Authorization (EUA).  This EUA will remain in effect (meaning this test can be used) for the duration of the COVID-19 declaration under Section 564(b)(1) of the Act, 21 U.S.C. section 360bbb-3(b)(1), unless the authorization is terminated or revoked sooner.  Performed at [facility], [address] [place], [place] [address]   Urine culture     Status: Abnormal   Collection Time: 09/01/20 12:05 AM   Specimen: In/Out Cath Urine  Result Value Ref Range Status   Specimen Description   Final    IN/OUT CATH URINE Performed at [facility], [address] [place], [place] [address]    Special Requests   Final    NONE Performed at [facility], [address] [place], [place] [address]    Culture >=100,000 COLONIES/mL YEAST (A)  Final   Report Status 09/02/2020 FINAL  Final  Blood Culture (routine x 2)     Status: None (Preliminary result)   Collection Time: 09/01/20  1:10 AM   Specimen: BLOOD LEFT WRIST  Result Value Ref Range Status   Specimen Description BLOOD LEFT WRIST  Final   Special Requests   Final    BOTTLES DRAWN AEROBIC AND ANAEROBIC Blood  Culture adequate volume   Culture   Final    NO GROWTH 4 DAYS Performed at [facility], [address] [place],  [address]    Report Status PENDING  Incomplete  Culture, blood (x 2)     Status: None (  Preliminary result)   Collection Time: 09/01/20  7:43 AM   Specimen: BLOOD LEFT WRIST  Result Value Ref Range Status   Specimen Description BLOOD LEFT WRIST  Final   Special Requests   Final    BOTTLES DRAWN AEROBIC AND ANAEROBIC Blood Culture adequate volume   Culture   Final    NO GROWTH 4 DAYS Performed at [facility], [address] [place], [place] [address]    Report Status PENDING  Incomplete  Culture, blood (x 2)     Status: None (Preliminary result)   Collection Time: 09/01/20  9:14 AM   Specimen: BLOOD LEFT HAND  Result Value Ref Range Status   Specimen Description BLOOD LEFT HAND  Final   Special Requests   Final    BOTTLES DRAWN AEROBIC ONLY Blood Culture results may not be optimal due to an inadequate volume of blood received in culture bottles   Culture   Final    NO GROWTH 4 DAYS Performed at [facility], [address], [place], [place] [address]    Report Status PENDING  Incomplete     Labs: Basic Metabolic Panel: Recent Labs  Lab 09/01/20 0110 09/01/20 0744 09/03/20 0426 09/04/20 0418 09/05/20 0356  NA 137 139 139 138 137  K 3.0* 3.5 2.9* 3.1* 3.2*  CL 100 105 103 104 104  CO2 $Re'25 25 26 26 25  'Zwa$ GLUCOSE 165* 155* 143* 105* 97  BUN $Re'10 8 6 7 6  'wUa$ CREATININE 0.79 0.63 0.54 0.63 0.54  CALCIUM 8.7* 8.6* 8.3* 8.6* 8.7*  MG  --  1.5*  --  1.6* 1.9   Liver Function Tests: Recent Labs  Lab 08/31/20 2315 09/01/20 0110 09/01/20 0744 09/03/20 0426  AST $Re'15 15 15 'xGK$ 10*  ALT $Re'9 9 8 8  'qgw$ ALKPHOS 205* 207* 182* 162*  BILITOT 0.6 0.6 0.7 0.4  PROT 6.4* 6.5 6.0* 5.9*  ALBUMIN 2.5* 2.7* 2.5* 2.3*   No results for input(s): LIPASE, AMYLASE in the last 168 hours. No results for input(s): AMMONIA in the last 168 hours. CBC: Recent Labs  Lab 08/31/20 1916  09/01/20 0744 09/03/20 0426 09/04/20 0418 09/05/20 0356  WBC 21.5* 9.1 7.9 7.0 8.3  NEUTROABS 19.7* 6.8  --   --   --   HGB 8.5* 7.4* 7.2* 7.7* 7.5*  HCT 26.7* 24.4* 23.2* 25.2* 24.8*  MCV 93.7 95.3 94.3 94.7 95.8  PLT 385 352 352 450* 446*   Cardiac Enzymes: No results for input(s): CKTOTAL, CKMB, CKMBINDEX, TROPONINI in the last 168 hours. BNP: Invalid input(s): POCBNP CBG: Recent Labs  Lab 09/03/20 2130 09/04/20 0857 09/04/20 1200 09/04/20 1620 09/05/20 0758  GLUCAP 139* 117* 96 69* 97    Time coordinating discharge:  36 minutes  Signed:  Orson Eva, DO [facility] Pager: [phone] 09/05/2020, 9:56 AM

## 2020-09-06 LAB — CULTURE, BLOOD (ROUTINE X 2)
Culture: NO GROWTH
Culture: NO GROWTH
Culture: NO GROWTH
Special Requests: ADEQUATE
Special Requests: ADEQUATE

## 2020-09-06 NOTE — Progress Notes (Signed)
Nsg Discharge Note  Admit Date:  08/31/2020 Discharge date: 09/06/2020   Tammie [name] to be D/C'd Home per MD order.  AVS completed.   Patient able to verbalize understanding.  Discharge Medication: Allergies as of 09/05/2020      Reactions   Codeine Rash      Medication List    STOP taking these medications   NovoLOG FlexPen 100 UNIT/ML FlexPen Generic drug: insulin aspart     TAKE these medications   acetaminophen 500 MG tablet Commonly known as: TYLENOL Take 1,000 mg by mouth every 6 (six) hours as needed for mild pain.   blood glucose meter kit and supplies Kit Dispense based on patient and insurance preference. Use up to four times daily as directed. (FOR ICD-9 250.00, 250.01).   ceFAZolin  IVPB Commonly known as: ANCEF Inject 2 g into the vein every 8 (eight) hours. Indication:  Emphysematous pyelonephritis, intra-abdominal abscess First Dose: Yes Last Day of Therapy:  09/25/2020 Labs - Once weekly:  CBC/D and BMP, Labs - Every other week:  ESR and CRP Method of administration: IV Push Method of administration may be changed at the discretion of home infusion pharmacist based upon assessment of the patient and/or caregiver's ability to self-administer the medication ordered.   CINNAMON PO Take 1 tablet by mouth daily.   ferrous sulfate 325 (65 FE) MG tablet Take 1 tablet (325 mg total) by mouth daily with breakfast.   Lantus SoloStar 100 UNIT/ML Solostar Pen Generic drug: insulin glargine Inject 8 Units into the skin 2 (two) times daily.   NovoLIN R FlexPen ReliOn 100 UNIT/ML Sopn Generic drug: Insulin Regular Human Sugar 121-150--2 units; 151-200 3 units; 201-250 5 units; 251-300--8 units; 301-350--11 units; 351-400--15 units   Pen Needles 3/16" 31G X 5 MM Misc 1 each by Does not apply route 3 (three) times daily.       Discharge Assessment: Vitals:   09/04/20 2036 09/05/20 0517  BP: 122/79 (!) 166/83  Pulse: 100 86  Resp: 20 18  Temp: 99 F (37.2  C) 98.4 F (36.9 C)  SpO2: 95% 98%   Skin clean, dry and intact without evidence of skin break down, no evidence of skin tears noted. IV catheter discontinued intact. Site without signs and symptoms of complications - no redness or edema noted at insertion site, patient denies c/o pain - only slight tenderness at site.  Dressing with slight pressure applied.  D/c Instructions-Education: Discharge instructions given to patient with verbalized understanding. D/c education completed with patient including follow up instructions, medication list, d/c activities limitations if indicated, with other d/c instructions as indicated by MD - patient able to verbalize understanding, all questions fully answered. Patient instructed to return to ED, call 911, or call MD for any changes in condition.  Patient escorted via [place], and D/C home via private auto.  [name], RN 09/06/2020 10:57 AM

## 2020-09-07 DIAGNOSIS — J90 Pleural effusion, not elsewhere classified: Secondary | ICD-10-CM | POA: Diagnosis not present

## 2020-09-07 DIAGNOSIS — N12 Tubulo-interstitial nephritis, not specified as acute or chronic: Secondary | ICD-10-CM | POA: Diagnosis not present

## 2020-09-07 DIAGNOSIS — R6520 Severe sepsis without septic shock: Secondary | ICD-10-CM | POA: Diagnosis not present

## 2020-09-07 DIAGNOSIS — N151 Renal and perinephric abscess: Secondary | ICD-10-CM | POA: Diagnosis not present

## 2020-09-08 ENCOUNTER — Other Ambulatory Visit: Payer: Self-pay | Admitting: Urology

## 2020-09-08 DIAGNOSIS — N12 Tubulo-interstitial nephritis, not specified as acute or chronic: Secondary | ICD-10-CM | POA: Diagnosis not present

## 2020-09-08 DIAGNOSIS — N151 Renal and perinephric abscess: Secondary | ICD-10-CM

## 2020-09-08 DIAGNOSIS — R6520 Severe sepsis without septic shock: Secondary | ICD-10-CM | POA: Diagnosis not present

## 2020-09-08 DIAGNOSIS — J90 Pleural effusion, not elsewhere classified: Secondary | ICD-10-CM | POA: Diagnosis not present

## 2020-09-08 LAB — GLUCOSE, CAPILLARY: Glucose-Capillary: 134 mg/dL — ABNORMAL HIGH (ref 70–99)

## 2020-09-09 ENCOUNTER — Other Ambulatory Visit: Payer: Self-pay

## 2020-09-10 ENCOUNTER — Encounter (HOSPITAL_COMMUNITY): Payer: Self-pay | Admitting: Emergency Medicine

## 2020-09-10 ENCOUNTER — Encounter: Payer: Self-pay | Admitting: Internal Medicine

## 2020-09-10 ENCOUNTER — Ambulatory Visit (INDEPENDENT_AMBULATORY_CARE_PROVIDER_SITE_OTHER): Payer: BC Managed Care – PPO | Admitting: Internal Medicine

## 2020-09-10 ENCOUNTER — Other Ambulatory Visit: Payer: Self-pay

## 2020-09-10 ENCOUNTER — Emergency Department (HOSPITAL_COMMUNITY): Payer: BC Managed Care – PPO

## 2020-09-10 ENCOUNTER — Emergency Department (HOSPITAL_COMMUNITY)
Admission: EM | Admit: 2020-09-10 | Discharge: 2020-09-10 | Disposition: A | Discharge status: Home / Self Care | Payer: BC Managed Care – PPO | Attending: Emergency Medicine | Admitting: Emergency Medicine

## 2020-09-10 VITALS — BP 128/82 | HR 104 | Temp 99.0°F | Wt 163.0 lb

## 2020-09-10 DIAGNOSIS — R7881 Bacteremia: Secondary | ICD-10-CM

## 2020-09-10 DIAGNOSIS — Z794 Long term (current) use of insulin: Secondary | ICD-10-CM | POA: Insufficient documentation

## 2020-09-10 DIAGNOSIS — N151 Renal and perinephric abscess: Secondary | ICD-10-CM | POA: Diagnosis not present

## 2020-09-10 DIAGNOSIS — T83032A Leakage of nephrostomy catheter, initial encounter: Secondary | ICD-10-CM | POA: Diagnosis not present

## 2020-09-10 DIAGNOSIS — N12 Tubulo-interstitial nephritis, not specified as acute or chronic: Secondary | ICD-10-CM | POA: Diagnosis not present

## 2020-09-10 DIAGNOSIS — R509 Fever, unspecified: Secondary | ICD-10-CM | POA: Diagnosis not present

## 2020-09-10 DIAGNOSIS — Y841 Kidney dialysis as the cause of abnormal reaction of the patient, or of later complication, without mention of misadventure at the time of the procedure: Secondary | ICD-10-CM | POA: Diagnosis not present

## 2020-09-10 DIAGNOSIS — E1110 Type 2 diabetes mellitus with ketoacidosis without coma: Secondary | ICD-10-CM | POA: Diagnosis not present

## 2020-09-10 DIAGNOSIS — R109 Unspecified abdominal pain: Secondary | ICD-10-CM

## 2020-09-10 DIAGNOSIS — I10 Essential (primary) hypertension: Secondary | ICD-10-CM | POA: Insufficient documentation

## 2020-09-10 DIAGNOSIS — B9620 Unspecified Escherichia coli [E. coli] as the cause of diseases classified elsewhere: Secondary | ICD-10-CM

## 2020-09-10 LAB — CBC
HCT: 26.9 % — ABNORMAL LOW (ref 36.0–46.0)
Hemoglobin: 8.3 g/dL — ABNORMAL LOW (ref 12.0–15.0)
MCH: 28.7 pg (ref 26.0–34.0)
MCHC: 30.9 g/dL (ref 30.0–36.0)
MCV: 93.1 fL (ref 80.0–100.0)
Platelets: 684 10*3/uL — ABNORMAL HIGH (ref 150–400)
RBC: 2.89 MIL/uL — ABNORMAL LOW (ref 3.87–5.11)
RDW: 14.5 % (ref 11.5–15.5)
WBC: 11.7 10*3/uL — ABNORMAL HIGH (ref 4.0–10.5)
nRBC: 0 % (ref 0.0–0.2)

## 2020-09-10 LAB — COMPREHENSIVE METABOLIC PANEL WITH GFR
ALT: 8 U/L (ref 0–44)
AST: 12 U/L — ABNORMAL LOW (ref 15–41)
Albumin: 3 g/dL — ABNORMAL LOW (ref 3.5–5.0)
Alkaline Phosphatase: 190 U/L — ABNORMAL HIGH (ref 38–126)
Anion gap: 11 (ref 5–15)
BUN: 14 mg/dL (ref 6–20)
CO2: 24 mmol/L (ref 22–32)
Calcium: 8.9 mg/dL (ref 8.9–10.3)
Chloride: 102 mmol/L (ref 98–111)
Creatinine, Ser: 0.86 mg/dL (ref 0.44–1.00)
GFR, Estimated: >60 mL/min
Glucose, Bld: 120 mg/dL — ABNORMAL HIGH (ref 70–99)
Potassium: 2.9 mmol/L — ABNORMAL LOW (ref 3.5–5.1)
Sodium: 137 mmol/L (ref 135–145)
Total Bilirubin: 0.5 mg/dL (ref 0.3–1.2)
Total Protein: 7.3 g/dL (ref 6.5–8.1)

## 2020-09-10 MED ORDER — IOHEXOL 300 MG/ML  SOLN
100.0000 mL | Freq: Once | INTRAMUSCULAR | Status: AC | PRN
  Administered 2020-09-10: 100 mL via INTRAVENOUS

## 2020-09-10 MED ORDER — POTASSIUM CHLORIDE CRYS ER 20 MEQ PO TBCR
40.0000 meq | EXTENDED_RELEASE_TABLET | Freq: Once | ORAL | Status: AC
  Administered 2020-09-10: 40 meq via ORAL
  Filled 2020-09-10: qty 2

## 2020-09-10 NOTE — ED Provider Notes (Addendum)
[facility] DEPT Provider Note   CSN: [id number] Arrival date & time: 09/10/20  1520     History Chief Complaint  Patient presents with  . nephrostomy tube issue    Tammie [name] is a 60 y.o. female.  HPI   60 year old female with complicated past medical history presents the emergency department for evaluation of nephrostomy/perirenal abscess drain evaluation.  Patient has had multiple admissions over the past couple weeks.  She was originally diagnosed with emphysematous pyelonephritis, a nephrostomy tube was placed.  Following this she developed sepsis and intra-abdominal abscesses that required drains as well.  She has recently been discharged, is getting IV antibiotics through PICC line and following up with infectious disease.  However over the past couple days she has noted that there has been minimal drainage in the pelvic and perirenal drains and decreased urine production in her nephrostomy tube.  She is also noted purulent drainage coming around the lower pelvic abscess drain.  She was seen in the office today at ID and they had concern for drain placement/infection and referred her here for evaluation.  Patient endorses fever/chills with the past couple days but denies any nausea/vomiting/diarrhea.  Past Medical History:  Diagnosis Date  . Acute pyelonephritis   . Class 1 obesity   . Hypertension   . Lung nodules   . Multifocal pneumonia 07/31/2020  . Perirenal abscess   . Pleural effusion     Patient Active Problem List   Diagnosis Date Noted  . Pyelonephritis   . Perinephric abscess 09/02/2020  . Sepsis secondary to UTI ([place]) 09/01/2020  . Type 2 diabetes mellitus with hyperglycemia ([place]) 09/01/2020  . Hypoalbuminemia 09/01/2020  . Nephrostomy tube displaced ([place]) 09/01/2020  . Elevated troponin   . Pleural effusion   . Emphysematous pyelonephritis of left kidney   . Ileus ([place]) 08/08/2020  . Severe sepsis ([place]) 08/07/2020  .  Perirenal abscess   . E coli bacteremia   . Acute pyelonephritis   . Bibasilar crackles   . Diabetic ketoacidosis without coma associated with type 2 diabetes mellitus ([place])   . Delirium   . Hyperosmolar hyperglycemic state (HHS) ([place]) 08/01/2020  . Hyponatremia 08/01/2020  . Hypertension   . Acute renal failure ([place])   . Hypokalemia   . Severe protein-calorie malnutrition ([place])   . Class 1 obesity   . Leukocytosis   . Hypomagnesemia   . Hyperphosphatemia   . Abdominal distention   . Lung nodules   . Multifocal pneumonia 07/31/2020    Past Surgical History:  Procedure Laterality Date  . ABDOMINAL HYSTERECTOMY    . IR CATHETER TUBE CHANGE  09/04/2020  . IR CATHETER TUBE CHANGE  09/04/2020     OB History   No obstetric history on file.     Family History  Problem Relation Age of Onset  . Huntington's disease Father   . Diabetes Sister     Social History   Tobacco Use  . Smoking status: Never Smoker  . Smokeless tobacco: Never Used  Vaping Use  . Vaping Use: Never used  Substance Use Topics  . Alcohol use: Not Currently  . Drug use: Not Currently    Home Medications Prior to Admission medications   Medication Sig Start Date End Date Taking? Authorizing Provider  acetaminophen (TYLENOL) 500 MG tablet Take 1,000 mg by mouth every 6 (six) hours as needed for mild pain.    [provider]  blood glucose meter kit and supplies KIT  Dispense based on patient and insurance preference. Use up to four times daily as directed. (FOR ICD-9 250.00, 250.01). 08/18/20   Shelly Coss, MD  ceFAZolin (ANCEF) IVPB Inject 2 g into the vein every 8 (eight) hours. Indication:  Emphysematous pyelonephritis, intra-abdominal abscess First Dose: Yes Last Day of Therapy:  09/25/2020 Labs - Once weekly:  CBC/D and BMP, Labs - Every other week:  ESR and CRP Method of administration: IV Push Method of administration may be changed at the discretion of home infusion pharmacist  based upon assessment of the patient and/or caregiver's ability to self-administer the medication ordered. 08/16/20 09/26/20  Florencia Reasons, MD  CINNAMON PO Take 1 tablet by mouth daily.    [provider]  ferrous sulfate 325 (65 FE) MG tablet Take 1 tablet (325 mg total) by mouth daily with breakfast. 09/05/20   Tat, Shanon Brow, MD  insulin glargine (LANTUS SOLOSTAR) 100 UNIT/ML Solostar Pen Inject 8 Units into the skin 2 (two) times daily. 08/18/20   Shelly Coss, MD  Insulin Pen Needle (PEN NEEDLES 3/16") 31G X 5 MM MISC 1 each by Does not apply route 3 (three) times daily. 08/18/20   Shelly Coss, MD  Insulin Regular Human (NOVOLIN R FLEXPEN RELION) 100 UNIT/ML SOPN Sugar 121-150--2 units; 151-200 3 units; 201-250 5 units; 251-300--8 units; 301-350--11 units; 351-400--15 units 09/05/20   Tat, David, MD    Allergies    Codeine  Review of Systems   Review of Systems  Constitutional: Positive for chills, fatigue and fever.  HENT: Negative for congestion.   Eyes: Negative for visual disturbance.  Respiratory: Negative for shortness of breath.   Cardiovascular: Negative for chest pain.  Gastrointestinal: Negative for abdominal pain, diarrhea and vomiting.  Genitourinary: Negative for decreased urine volume and dysuria.  Skin: Negative for rash.  Neurological: Negative for headaches.    Physical Exam Updated Vital Signs BP 112/61 (BP Location: Left Arm)   Pulse 98   Temp 98.2 F (36.8 C) (Oral)   Resp 18   LMP  (LMP Unknown)   SpO2 100%   Physical Exam Vitals and nursing note reviewed.  Constitutional:      Appearance: Normal appearance.  HENT:     Head: Normocephalic.     Mouth/Throat:     Mouth: Mucous membranes are moist.  Cardiovascular:     Rate and Rhythm: Normal rate.  Pulmonary:     Effort: Pulmonary effort is normal. No respiratory distress.  Abdominal:     Palpations: Abdomen is soft.     Tenderness: There is no abdominal tenderness.  Genitourinary:     Comments: Left nephrostomy tube in place, no drainage around the tube, connected to urine bag, draining clear yellow urine Skin:    General: Skin is warm.     Comments: 2 other percutaneous drains, the middle 1 has no drainage around the site, no redness, the lower drain has slight erythema around the site with purulent drainage coming around the tube  Neurological:     Mental Status: She is alert and oriented to person, place, and time. Mental status is at baseline.  Psychiatric:        Mood and Affect: Mood normal.     ED Results / Procedures / Treatments   Labs (all labs ordered are listed, but only abnormal results are displayed) Labs Reviewed  COMPREHENSIVE METABOLIC PANEL - Abnormal; Notable for the following components:      Result Value   Potassium 2.9 (*)    Glucose,  Bld 120 (*)    Albumin 3.0 (*)    AST 12 (*)    Alkaline Phosphatase 190 (*)    All other components within normal limits  CBC - Abnormal; Notable for the following components:   WBC 11.7 (*)    RBC 2.89 (*)    Hemoglobin 8.3 (*)    HCT 26.9 (*)    Platelets 684 (*)    All other components within normal limits    EKG None  Radiology No results found.  Procedures Procedures   Medications Ordered in ED Medications - No data to display  ED Course  I have reviewed the triage vital signs and the nursing notes.  Pertinent labs & imaging results that were available during my care of the patient were reviewed by me and considered in my medical decision making (see chart for details).    MDM Rules/Calculators/A&P                          60 year old female presents the emergency department concern for drainage around one of her percutaneous drain sites.  Patient was seen by her infectious disease doctor today, sent here for further evaluation.  Vitals are stable, she is afebrile, laying in bed and very well-appearing.  At the lower retroperitoneal drain site there is purulent drainage coming around  the tube, very slight redness at the skin but no significant findings of cellulitis.  Blood work shows a mild leukocytosis of 11.7, baseline anemia, baseline hypokalemia.  CT of the abdomen pelvis shows that the left nephrostomy tube, perirenal abscess drainage tube and retroperitoneal abscess drainage tube are all in appropriate positions.  The perirenal abscess is diminished, there is a small portion of the retroperitoneal abscess that has extended but no other acute findings.  Results were discussed with Dr. Johnnye Sima who is on-call for Dr. Juleen China.  Plan is for patient to continue home IV antibiotics and follow-up in the office with infectious disease.  I messaged CT results to Dr. Juleen China via secure chat.  Patient was given potassium replacement.  Patient will be discharged and treated as an outpatient.  Discharge plan and strict return to ED precautions discussed, patient verbalizes understanding and agreement.  Final Clinical Impression(s) / ED Diagnoses Final diagnoses:  None    Rx / DC Orders ED Discharge Orders    None       Lorelle Gibbs, DO 09/10/20 2143    Lorelle Gibbs, DO 09/10/20 2154

## 2020-09-10 NOTE — ED Triage Notes (Signed)
Patient states she has 3 drains tubes from her kidney, reports no drainage coming from her L sided drain since last Friday, even when flushed. Infectious disease recommended to go to the ED for a CT. Reports a fever of 99-103 degrees yesterday, medicated w/ Tylenol.

## 2020-09-10 NOTE — Progress Notes (Signed)
[facility] for Infectious Disease  Reason for Consult: Hospital follow up perinephric abscess  Referring Provider: Hospital follow up   HPI:    Tammie [name] is a 60 y.o. female with PMHx as below who presents to the clinic for hospital follow up of perinephric abscess.   She is a 60 yo woman with hx of DM, HTN, emphysematous pyelo, intra-abdominal abscess.  She has a complicated history with hospitalization 07/31/20-08/18/20 where she was treated for sepsis secondary to emphysematous pyelo, pelvic abscess, and perinephric abscess.  Cultures grew pan-sensitive E coli.  During that hospitalization, she required placement of a left perc nephrostomy tube as well as drains to pelvic abscess and perirenal abscess. PICC was placed and she was discharged on cefazolin through 09/25/20.  During that admit also found to have left renal vein thrombus, bilateral pleural effusion and pulm abscess/nodules but was never symptomatic from respiratory standpoint.  She was then readmitted 1/23-1/28/22 after presenting with fevers, nausea, and vomiting.  Also with elevated WBC and decreased drain output.  Repeat CT done 09/01/20 with left perinephric abscess slightly decreased in size compared to prior.  Left pararenal abscess appeared relatively unchanged.  IR upsized her drains on 09/04/20.  Pleural effusions had resolved.  Subpleural nodule within the right lung base and atelectasis within the left lung base were unchanged.    She was treated with cefepime during admission and narrowed back to cefazolin at discharge.  She has repeat CT scan scheduled for tomorrow.   Urology planning for possible nephrectomy in the future as an outpatient.   OPAT labs from 09/08/20 have a WBC 8.7, Hgb 9.8, platelets 266.  BUN 15, Creatinine 0.8.  Today, she reports having increased pain in her back since drains were upsized on 09/04/20.  She also reports minimal drainage into catheter drain bags but is having copious  drainage around the tubing.  She also endorses decreased PCN tube drainage today.  She has been having fevers and sweats.    Patient's Medications  New Prescriptions   No medications on file  Previous Medications   ACETAMINOPHEN (TYLENOL) 500 MG TABLET    Take 1,000 mg by mouth every 6 (six) hours as needed for mild pain.   BLOOD GLUCOSE METER KIT AND SUPPLIES KIT    Dispense based on patient and insurance preference. Use up to four times daily as directed. (FOR ICD-9 250.00, 250.01).   CEFAZOLIN (ANCEF) IVPB    Inject 2 g into the vein every 8 (eight) hours. Indication:  Emphysematous pyelonephritis, intra-abdominal abscess First Dose: Yes Last Day of Therapy:  09/25/2020 Labs - Once weekly:  CBC/D and BMP, Labs - Every other week:  ESR and CRP Method of administration: IV Push Method of administration may be changed at the discretion of home infusion pharmacist based upon assessment of the patient and/or caregiver's ability to self-administer the medication ordered.   CINNAMON PO    Take 1 tablet by mouth daily.   FERROUS SULFATE 325 (65 FE) MG TABLET    Take 1 tablet (325 mg total) by mouth daily with breakfast.   INSULIN GLARGINE (LANTUS SOLOSTAR) 100 UNIT/ML SOLOSTAR PEN    Inject 8 Units into the skin 2 (two) times daily.   INSULIN PEN NEEDLE (PEN NEEDLES 3/16") 31G X 5 MM MISC    1 each by Does not apply route 3 (three) times daily.   INSULIN REGULAR HUMAN (NOVOLIN R FLEXPEN RELION) 100 UNIT/ML SOPN    Sugar  121-150--2 units; 151-200 3 units; 201-250 5 units; 251-300--8 units; 301-350--11 units; 351-400--15 units  Modified Medications   No medications on file  Discontinued Medications   No medications on file      Past Medical History:  Diagnosis Date  . Acute pyelonephritis   . Class 1 obesity   . Hypertension   . Lung nodules   . Multifocal pneumonia 07/31/2020  . Perirenal abscess   . Pleural effusion     Social History   Tobacco Use  . Smoking status: Never Smoker   . Smokeless tobacco: Never Used  Vaping Use  . Vaping Use: Never used  Substance Use Topics  . Alcohol use: Not Currently  . Drug use: Not Currently    Family History  Problem Relation Age of Onset  . Huntington's disease Father   . Diabetes Sister     Allergies  Allergen Reactions  . Codeine Rash    Review of Systems  Constitutional: Positive for chills, diaphoresis and fever.  Gastrointestinal: Negative.   Musculoskeletal: Positive for back pain.  Skin: Negative for rash.      OBJECTIVE:    Vitals:   09/10/20 1344  BP: 128/82  Pulse: (!) 104  Temp: 99 F (37.2 C)  TempSrc: Oral  SpO2: 97%  Weight: 163 lb (73.9 kg)     Body mass index is 30.8 kg/m.  Physical Exam Constitutional:      General: She is not in acute distress.    Appearance: She is ill-appearing.  HENT:     Head: Normocephalic and atraumatic.  Pulmonary:     Effort: Pulmonary effort is normal. No respiratory distress.  Genitourinary:    Comments: Left sided PCN tube with minimal urine in bag. Very little purulent drainage in both abscess drainage bags. Drain sites without erythema.  Copious purulent drainage noted around her drain sites. Skin:    General: Skin is warm and dry.  Neurological:     General: No focal deficit present.     Mental Status: She is alert and oriented to person, place, and time.  Psychiatric:        Mood and Affect: Mood normal.        Behavior: Behavior normal.      Labs and Microbiology:  CBC Latest Ref Rng & Units 09/05/2020 09/04/2020 09/03/2020  WBC 4.0 - 10.5 K/uL 8.3 7.0 7.9  Hemoglobin 12.0 - 15.0 g/dL 7.5(L) 7.7(L) 7.2(L)  Hematocrit 36.0 - 46.0 % 24.8(L) 25.2(L) 23.2(L)  Platelets 150 - 400 K/uL 446(H) 450(H) 352   CMP Latest Ref Rng & Units 09/05/2020 09/04/2020 09/03/2020  Glucose 70 - 99 mg/dL 97 105(H) 143(H)  BUN 6 - 20 mg/dL $Remove'6 7 6  'zQFCmiC$ Creatinine 0.44 - 1.00 mg/dL 0.54 0.63 0.54  Sodium 135 - 145 mmol/L 137 138 139  Potassium 3.5 - 5.1 mmol/L  3.2(L) 3.1(L) 2.9(L)  Chloride 98 - 111 mmol/L 104 104 103  CO2 22 - 32 mmol/L $RemoveB'25 26 26  'gGPWsQji$ Calcium 8.9 - 10.3 mg/dL 8.7(L) 8.6(L) 8.3(L)  Total Protein 6.5 - 8.1 g/dL - - 5.9(L)  Total Bilirubin 0.3 - 1.2 mg/dL - - 0.4  Alkaline Phos 38 - 126 U/L - - 162(H)  AST 15 - 41 U/L - - 10(L)  ALT 0 - 44 U/L - - 8     Recent Results (from the past 240 hour(s))  Blood Culture (routine x 2)     Status: None   Collection Time: 08/31/20 11:15 PM   Specimen:  BLOOD LEFT FOREARM  Result Value Ref Range Status   Specimen Description BLOOD LEFT FOREARM  Final   Special Requests   Final    BOTTLES DRAWN AEROBIC AND ANAEROBIC Blood Culture adequate volume   Culture   Final    NO GROWTH 5 DAYS Performed at [facility], [address] [place], [place] [address]    Report Status 09/05/2020 FINAL  Final  SARS Coronavirus 2 by RT PCR (hospital order, performed in [facility] hospital lab) Nasopharyngeal Nasopharyngeal Swab     Status: None   Collection Time: 08/31/20 11:37 PM   Specimen: Nasopharyngeal Swab  Result Value Ref Range Status   SARS Coronavirus 2 NEGATIVE NEGATIVE Final    Comment: (NOTE) SARS-CoV-2 target nucleic acids are NOT DETECTED.  The SARS-CoV-2 RNA is generally detectable in upper and lower respiratory specimens during the acute phase of infection. The lowest concentration of SARS-CoV-2 viral copies this assay can detect is 250 copies / mL. A negative result does not preclude SARS-CoV-2 infection and should not be used as the sole basis for treatment or other patient management decisions.  A negative result may occur with improper specimen collection / handling, submission of specimen other than nasopharyngeal swab, presence of viral mutation(s) within the areas targeted by this assay, and inadequate number of viral copies (<250 copies / mL). A negative result must be combined with clinical observations, patient history, and epidemiological information.  Fact Sheet for  Patients:   StrictlyIdeas.no  Fact Sheet for Healthcare Providers: BankingDealers.co.za  This test is not yet approved or  cleared by the [place] FDA and has been authorized for detection and/or diagnosis of SARS-CoV-2 by FDA under an Emergency Use Authorization (EUA).  This EUA will remain in effect (meaning this test can be used) for the duration of the COVID-19 declaration under Section 564(b)(1) of the Act, 21 U.S.C. section 360bbb-3(b)(1), unless the authorization is terminated or revoked sooner.  Performed at [facility], [address], [place], [place] [address]   Urine culture     Status: Abnormal   Collection Time: 09/01/20 12:05 AM   Specimen: In/Out Cath Urine  Result Value Ref Range Status   Specimen Description   Final    IN/OUT CATH URINE Performed at [facility], [address] [place], [place] [address]    Special Requests   Final    NONE Performed at [facility], [address], [place], [place] [address]    Culture >=100,000 COLONIES/mL YEAST (A)  Final   Report Status 09/02/2020 FINAL  Final  Blood Culture (routine x 2)     Status: None   Collection Time: 09/01/20  1:10 AM   Specimen: BLOOD LEFT WRIST  Result Value Ref Range Status   Specimen Description BLOOD LEFT WRIST  Final   Special Requests   Final    BOTTLES DRAWN AEROBIC AND ANAEROBIC Blood Culture adequate volume   Culture   Final    NO GROWTH 5 DAYS Performed at [facility], [address] [place],  [address]    Report Status 09/06/2020 FINAL  Final  Culture, blood (x 2)     Status: None   Collection Time: 09/01/20  7:43 AM   Specimen: BLOOD LEFT WRIST  Result Value Ref Range Status   Specimen Description BLOOD LEFT WRIST  Final   Special Requests   Final    BOTTLES DRAWN AEROBIC AND ANAEROBIC Blood Culture adequate volume   Culture   Final    NO  GROWTH 5 DAYS Performed at [facility], [address], [place], [place]  [address]    Report Status 09/06/2020 FINAL  Final  Culture, blood (x 2)     Status: None   Collection Time: 09/01/20  9:14 AM   Specimen: BLOOD LEFT HAND  Result Value Ref Range Status   Specimen Description BLOOD LEFT HAND  Final   Special Requests   Final    BOTTLES DRAWN AEROBIC ONLY Blood Culture results may not be optimal due to an inadequate volume of blood received in culture bottles   Culture   Final    NO GROWTH 5 DAYS Performed at [facility], [address], [place], [place] [address]    Report Status 09/06/2020 FINAL  Final    Imaging: CT SCAN 09/01/20 IMPRESSION: Left perinephric abscess has slightly decreased in size when compared to prior examination. Percutaneous drainage catheter is seen within the collection, though has been partially withdrawn when compared to prior exam.  Left posterior pararenal U shaped abscess appears unchanged when compared to prior examination. Percutaneous drainage catheter appears appropriately position within the dependent component of the collection, unchanged from prior examination.  Left percutaneous nephrostomy in appropriate position. No hydronephrosis.  Resolved bilateral pleural effusions. Stable right subpleural pulmonary nodule, likely inflammatory in nature.   ASSESSMENT & PLAN:    1. Perirenal abscess s/p IR drain placement x 2 2. E coli bacteremia 3. Pyelonephritis s/p PCN drain  I am concerned regarding the significant drainage around her abscess catheter tubing and lack of drainage into the catheter bag.  She is also having increased pain in her back and fevers at home.  She has an appointment tomorrow for repeat imaging and drain clinic evaluation.  We called over to their office today and they would not be able to evaluate her in clinic today and would not be able to do any drain manipulation there anyways as that would have to be done at [facility] or [facility].  Discussed with patient and husband.  Given her  fevers, worse pain, lack of drainage in to catheter bag, and increased drainage from around the tubing will recommend she go to the ED for an expedited work up.   Raynelle Highland for Infectious Disease [facility] 09/10/2020, 2:14 PM   I spent greater than 40 minutes dedicated to the care of this patient on the date of this encounter to include pre-visit review of records, face-to-face time with the patient discussing E coli infection,  <condition/treatment>, and post-visit ordering of testing.

## 2020-09-10 NOTE — Discharge Instructions (Addendum)
You have been seen and discharged from the emergency department.  Follow-up with your primary provider and infectious disease for reevaluation.  CAT scan shows that the tubes are all in the appropriate place.  You may continue to have some drainage around the tube.  Continue to take medications as prescribed. If you have any worsening symptoms or further concerns for health please return to an emergency department for further evaluation.

## 2020-09-10 NOTE — Patient Instructions (Signed)
Thank you for coming to see me today. It was a pleasure seeing you.  To Do: Marland Kitchen Please go to the ED so that they can more urgently get imaging and have your drainage catheters evaluated by IR  If you have any questions or concerns, please do not hesitate to call the office at [phone].  Take Care,   Gwynn Burly, DO

## 2020-09-11 ENCOUNTER — Other Ambulatory Visit: Payer: BC Managed Care – PPO

## 2020-09-14 DIAGNOSIS — R6520 Severe sepsis without septic shock: Secondary | ICD-10-CM | POA: Diagnosis not present

## 2020-09-14 DIAGNOSIS — J90 Pleural effusion, not elsewhere classified: Secondary | ICD-10-CM | POA: Diagnosis not present

## 2020-09-14 DIAGNOSIS — N151 Renal and perinephric abscess: Secondary | ICD-10-CM | POA: Diagnosis not present

## 2020-09-14 DIAGNOSIS — N12 Tubulo-interstitial nephritis, not specified as acute or chronic: Secondary | ICD-10-CM | POA: Diagnosis not present

## 2020-09-16 ENCOUNTER — Other Ambulatory Visit: Payer: BC Managed Care – PPO

## 2020-09-16 DIAGNOSIS — N151 Renal and perinephric abscess: Secondary | ICD-10-CM | POA: Diagnosis not present

## 2020-09-16 DIAGNOSIS — N12 Tubulo-interstitial nephritis, not specified as acute or chronic: Secondary | ICD-10-CM | POA: Diagnosis not present

## 2020-09-16 DIAGNOSIS — R6520 Severe sepsis without septic shock: Secondary | ICD-10-CM | POA: Diagnosis not present

## 2020-09-16 DIAGNOSIS — J90 Pleural effusion, not elsewhere classified: Secondary | ICD-10-CM | POA: Diagnosis not present

## 2020-09-17 ENCOUNTER — Other Ambulatory Visit: Payer: Self-pay

## 2020-09-21 DIAGNOSIS — J90 Pleural effusion, not elsewhere classified: Secondary | ICD-10-CM | POA: Diagnosis not present

## 2020-09-21 DIAGNOSIS — N12 Tubulo-interstitial nephritis, not specified as acute or chronic: Secondary | ICD-10-CM | POA: Diagnosis not present

## 2020-09-21 DIAGNOSIS — N151 Renal and perinephric abscess: Secondary | ICD-10-CM | POA: Diagnosis not present

## 2020-09-21 DIAGNOSIS — R6520 Severe sepsis without septic shock: Secondary | ICD-10-CM | POA: Diagnosis not present

## 2020-09-22 DIAGNOSIS — N151 Renal and perinephric abscess: Secondary | ICD-10-CM | POA: Diagnosis not present

## 2020-09-22 DIAGNOSIS — J90 Pleural effusion, not elsewhere classified: Secondary | ICD-10-CM | POA: Diagnosis not present

## 2020-09-22 DIAGNOSIS — R6520 Severe sepsis without septic shock: Secondary | ICD-10-CM | POA: Diagnosis not present

## 2020-09-22 DIAGNOSIS — N12 Tubulo-interstitial nephritis, not specified as acute or chronic: Secondary | ICD-10-CM | POA: Diagnosis not present

## 2020-09-23 DIAGNOSIS — N12 Tubulo-interstitial nephritis, not specified as acute or chronic: Secondary | ICD-10-CM | POA: Diagnosis not present

## 2020-09-23 DIAGNOSIS — J90 Pleural effusion, not elsewhere classified: Secondary | ICD-10-CM | POA: Diagnosis not present

## 2020-09-23 DIAGNOSIS — N151 Renal and perinephric abscess: Secondary | ICD-10-CM | POA: Diagnosis not present

## 2020-09-23 DIAGNOSIS — R6520 Severe sepsis without septic shock: Secondary | ICD-10-CM | POA: Diagnosis not present

## 2020-09-24 ENCOUNTER — Ambulatory Visit
Admission: RE | Admit: 2020-09-24 | Discharge: 2020-09-24 | Disposition: A | Discharge status: Home / Self Care | Payer: BC Managed Care – PPO | Source: Ambulatory Visit | Attending: Student | Admitting: Student

## 2020-09-24 ENCOUNTER — Other Ambulatory Visit: Payer: Self-pay

## 2020-09-24 ENCOUNTER — Ambulatory Visit
Admission: RE | Admit: 2020-09-24 | Discharge: 2020-09-24 | Disposition: A | Discharge status: Home / Self Care | Payer: BC Managed Care – PPO | Source: Ambulatory Visit | Attending: Urology | Admitting: Urology

## 2020-09-24 ENCOUNTER — Encounter: Payer: Self-pay | Admitting: *Deleted

## 2020-09-24 DIAGNOSIS — N151 Renal and perinephric abscess: Secondary | ICD-10-CM

## 2020-09-24 DIAGNOSIS — N10 Acute tubulo-interstitial nephritis: Secondary | ICD-10-CM | POA: Diagnosis not present

## 2020-09-24 HISTORY — PX: IR RADIOLOGIST EVAL & MGMT: IMG5224

## 2020-09-24 MED ORDER — IOPAMIDOL (ISOVUE-370) INJECTION 76%
100.0000 mL | Freq: Once | INTRAVENOUS | Status: AC | PRN
  Administered 2020-09-24: 100 mL via INTRAVENOUS

## 2020-09-29 ENCOUNTER — Other Ambulatory Visit: Payer: Self-pay | Admitting: Urology

## 2020-09-29 ENCOUNTER — Encounter: Payer: Self-pay | Admitting: Internal Medicine

## 2020-09-29 ENCOUNTER — Ambulatory Visit (INDEPENDENT_AMBULATORY_CARE_PROVIDER_SITE_OTHER): Payer: BC Managed Care – PPO | Admitting: Internal Medicine

## 2020-09-29 ENCOUNTER — Other Ambulatory Visit: Payer: Self-pay

## 2020-09-29 ENCOUNTER — Telehealth: Payer: Self-pay

## 2020-09-29 VITALS — BP 149/80 | HR 92 | Temp 98.2°F | Ht 61.0 in | Wt 163.0 lb

## 2020-09-29 DIAGNOSIS — N12 Tubulo-interstitial nephritis, not specified as acute or chronic: Secondary | ICD-10-CM

## 2020-09-29 DIAGNOSIS — N151 Renal and perinephric abscess: Secondary | ICD-10-CM | POA: Diagnosis not present

## 2020-09-29 DIAGNOSIS — J90 Pleural effusion, not elsewhere classified: Secondary | ICD-10-CM | POA: Diagnosis not present

## 2020-09-29 DIAGNOSIS — R6520 Severe sepsis without septic shock: Secondary | ICD-10-CM | POA: Diagnosis not present

## 2020-09-29 DIAGNOSIS — K651 Peritoneal abscess: Secondary | ICD-10-CM | POA: Diagnosis not present

## 2020-09-29 DIAGNOSIS — K6819 Other retroperitoneal abscess: Secondary | ICD-10-CM

## 2020-09-29 NOTE — Telephone Encounter (Signed)
Home health infusion called and asked if they could get a Pull Pic order on pt. They left message. I called back to verify and they said they had just received orders from Infectious disease and they extended the infusion for 6 more weeks so no order was now needed.

## 2020-09-29 NOTE — Patient Instructions (Signed)
Your abscesses are still very big. I worry despite you feeling ok now, off antibiotics, we will run into issue with sepsis in the next several days  Will restart cefazolin antibiotics  The surgeon dr Laverle Hobby will need to review your chart/ct-scans and discuss if there is a need now (after 8 weeks of antibiotics) that the abscesses remain the same. Or the other option could be the IR placing another drain into the abscesses.  We'll follow up with you in 4 weeks

## 2020-09-29 NOTE — Telephone Encounter (Signed)
Spoke with Corrie Dandy at [facility] to relay verbal orders per Dr. Renold Don to continue IV cefazolin for an additional 6 weeks with weekly CBC, CMP, and CRP. Orders repeated and verified.   Requested that patient's most recent labs be faxed to our office.   Sandie Ano, RN

## 2020-09-29 NOTE — Progress Notes (Addendum)
[place] [facility]  Reason for Consult: Hospital follow up perinephric abscess  Referring Provider: Hospital follow up   HPI:    Tammie [name] is a 60 y.o. female with PMHx as below who presents to the clinic for follow up of perinephric abscess.   09/29/20 id f/u Patient had repeat ct of the abd/pelv on 2/02 ed visit after referred from clinic; no adjustment of drains done. She then had repeat ct by IR on 2/16 -- result below. Some improvement in perinephric abscess size but increasing pararenal abscess. Again one of the drain is not draining well, but the one into the perinephric abscess is not Her last cefazolin dose was on 2/18 No f/c/n/v/diarrhea/rash Feels well today    Background from dr [facility] clinic note on 09/10/20 ---------- She is a 60 yo woman with hx of DM, HTN, emphysematous pyelo, intra-abdominal abscess.  She has a complicated history with hospitalization 07/31/20-08/18/20 where she was treated for sepsis secondary to emphysematous pyelo, pelvic abscess, and perinephric abscess.  Cultures grew pan-sensitive E coli.  During that hospitalization, she required placement of a left perc nephrostomy tube as well as drains to pelvic abscess and perirenal abscess. PICC was placed and she was discharged on cefazolin through 09/25/20.  During that admit also found to have left renal vein thrombus, bilateral pleural effusion and pulm abscess/nodules but was never symptomatic from respiratory standpoint.  She was then readmitted 1/23-1/28/22 after presenting with fevers, nausea, and vomiting.  Also with elevated WBC and decreased drain output.  Repeat CT done 09/01/20 with left perinephric abscess slightly decreased in size compared to prior.  Left pararenal abscess appeared relatively unchanged.  IR upsized her drains on 09/04/20.  Pleural effusions had resolved.  Subpleural nodule within the right lung base and atelectasis within the left lung base were  unchanged.    She was treated with cefepime during admission and narrowed back to cefazolin at discharge.  She has repeat CT scan scheduled for tomorrow.   Urology planning for possible nephrectomy in the future as an outpatient.   OPAT labs from 09/08/20 have a WBC 8.7, Hgb 9.8, platelets 266.  BUN 15, Creatinine 0.8.  Today, she reports having increased pain in her back since drains were upsized on 09/04/20.  She also reports minimal drainage into catheter drain bags but is having copious drainage around the tubing.  She also endorses decreased PCN tube drainage today.  She has been having fevers and sweats.    Patient's Medications  New Prescriptions   No medications on file  Previous Medications   ACETAMINOPHEN (TYLENOL) 500 MG TABLET    Take 1,000 mg by mouth every 6 (six) hours as needed for mild pain.   BLOOD GLUCOSE METER KIT AND SUPPLIES KIT    Dispense based on patient and insurance preference. Use up to four times daily as directed. (FOR ICD-9 250.00, 250.01).   CINNAMON PO    Take 1 tablet by mouth daily.   FERROUS SULFATE 325 (65 FE) MG TABLET    Take 1 tablet (325 mg total) by mouth daily with breakfast.   INSULIN GLARGINE (LANTUS SOLOSTAR) 100 UNIT/ML SOLOSTAR PEN    Inject 8 Units into the skin 2 (two) times daily.   INSULIN PEN NEEDLE (PEN NEEDLES 3/16") 31G X 5 MM MISC    1 each by Does not apply route 3 (three) times daily.   INSULIN REGULAR HUMAN (NOVOLIN R FLEXPEN RELION) 100 UNIT/ML SOPN  Sugar 121-150--2 units; 151-200 3 units; 201-250 5 units; 251-300--8 units; 301-350--11 units; 351-400--15 units  Modified Medications   No medications on file  Discontinued Medications   No medications on file      Past Medical History:  Diagnosis Date  . Acute pyelonephritis   . Class 1 obesity   . Hypertension   . Lung nodules   . Multifocal pneumonia 07/31/2020  . Perirenal abscess   . Pleural effusion     Social History   Tobacco Use  . Smoking status: Never Smoker   . Smokeless tobacco: Never Used  Vaping Use  . Vaping Use: Never used  Substance Use Topics  . Alcohol use: Not Currently  . Drug use: Not Currently    Family History  Problem Relation Age of Onset  . Huntington's disease Father   . Diabetes Sister     Allergies  Allergen Reactions  . Codeine Rash    Review of Systems  Gastrointestinal: Negative.   Skin: Negative for rash.      OBJECTIVE:    Vitals:   09/29/20 1450  BP: (!) 149/80  Pulse: 92  Temp: 98.2 F (36.8 C)  TempSrc: Oral  SpO2: 97%  Weight: 163 lb (73.9 kg)  Height: _0  (1.549 m)     Body mass index is 30.8 kg/m.  Physical Exam HENT:     Head: Normocephalic and atraumatic.  Pulmonary:     Effort: Pulmonary effort is normal. No respiratory distress.  Genitourinary:    Comments: Left sided PCN tube with clear urine in bag. No drainage from perc drain #1 (the perinephric abscess drain) Purulent drainage from perc drain #2 (the pararenal inferior abscess)  Drain site without erythema purulence Skin:    General: Skin is warm and dry.     Comments: Rue picc site no erythema/tenderness/flutuance  Neurological:     General: No focal deficit present.     Mental Status: She is alert and oriented to person, place, and time.  Psychiatric:        Mood and Affect: Mood normal.        Behavior: Behavior normal.      Labs and Microbiology:  CBC Latest Ref Rng & Units 09/10/2020 09/05/2020 09/04/2020  WBC 4.0 - 10.5 K/uL 11.7(H) 8.3 7.0  Hemoglobin 12.0 - 15.0 g/dL 8.3(L) 7.5(L) 7.7(L)  Hematocrit 36.0 - 46.0 % 26.9(L) 24.8(L) 25.2(L)  Platelets 150 - 400 K/uL 684(H) 446(H) 450(H)   CMP Latest Ref Rng & Units 09/10/2020 09/05/2020 09/04/2020  Glucose 70 - 99 mg/dL 120(H) 97 105(H)  BUN 6 - 20 mg/dL _1 Creatinine 0.44 - 1.00 mg/dL 0.86 0.54 0.63  Sodium 135 - 145 mmol/L 137 137 138  Potassium 3.5 - 5.1 mmol/L 2.9(L) 3.2(L) 3.1(L)  Chloride 98 - 111 mmol/L 102 104 104  CO2 22 - 32 mmol/L _2 Calcium 8.9 - 10.3 mg/dL 8.9 8.7(L) 8.6(L)  Total Protein 6.5 - 8.1 g/dL 7.3 - -  Total Bilirubin 0.3 - 1.2 mg/dL 0.5 - -  Alkaline Phos 38 - 126 U/L 190(H) - -  AST 15 - 41 U/L 12(L) - -  ALT 0 - 44 U/L 8 - -     No results found for this or any previous visit (from the past 240 hour(s)).  Imaging: CT SCAN 09/01/20 IMPRESSION: Left perinephric abscess has slightly decreased in size when compared to prior examination. Percutaneous drainage catheter is seen within the collection, though has  been partially withdrawn when compared to prior exam.  Left posterior pararenal U shaped abscess appears unchanged when compared to prior examination. Percutaneous drainage catheter appears appropriately position within the dependent component of the collection, unchanged from prior examination.  Left percutaneous nephrostomy in appropriate position. No hydronephrosis.  Resolved bilateral pleural effusions. Stable right subpleural pulmonary nodule, likely inflammatory in nature.   2/16 ct abd pelv with contrast 1. Left nephrostomy catheter is in good position with mild pelvicaliectasis. 2. Minimal interval decrease in size of 7.7 cm posterior perinephric gas and fluid collection with pigtail drain catheter well position in the central aspect. 3. Slight enlargement of multiloculated left retroperitoneal iliopsoas collection, despite drain catheter manipulation.  ASSESSMENT & PLAN:    1. Perirenal abscess s/p IR drain placement x 2 2. E coli bacteremia 3. Pyelonephritis s/p PCN drain  Lines: 1/01-c RUE picc  Abx: 12/27-1/23; 1/28-2/18 cefazolin 08/31/20-01/28 cefepime  12/23-27 ceftriaxone   60 yo female obese, dm2 admitted 12/23 for severe sepsis with aki, HHS, leukemoid reaction, found to have acute left emphysematous pyelonephritis with left renal vein thrombus, perirenal abscess, bilateral pleural effusion/pulm abscess-nodules, complicated by ecoli bacteremia.  Patient discharged after drain placement into the pararenal abscess, however readmitted 1/23 for sepsis with increased size perinephric abscess. She had subsequent IR visits who deemed the drains are in correct position, but unclear why one drain (into the perinephric abscess) is not draining well.  She also has a nephrostomy tube.  She is now at 8 weeks of antibiotics with minimal improvement in the abscess size. One of the drains is not draining well. Repeat ct last on 2/16 is concerning with increased pararenal abscess that appears multiloculated. The perinephric abscess has some improvement in size.    We reviewed the ct scan from December and 09/24/20... they look basically the same. There are 2 large abscesses -- one just above the left pelvis and one right below the left kidney.   Patient will need to be back on antibiotics given the abscess size. She'll also need to have discussion with urology/surgery. I worry she might need surgery for abscess clean out. The other more conservative route is another drain into the abscess due to loculation and see if more can be drained  She does mention that after 2/16 manual drainage of the abscess (drain #2 -- pararenal abscess) has been at least 1000 plus cc. So maybe repeat ct will see much improvement  -restart cefazolin 2 gram iv q8 hours -- will plan 6 weeks until 11/10/2020 -advise patient to see urologist again -- I will include a message to Dr Nicolette Bang -she is to see the IR again on 3/02 -- will await repeat ct scan and dr McKenzie's input before further decision  -I don't see any opat labs in [place], will have our staff contact [place] and have weekly labs (cbc, cmp, crp) faxed to our clinic for monitoring   [facility] 09/29/2020, 2:58 PM   I spent greater than 30 minutes dedicated to the care of this patient on the date of this encounter to include pre-visit review of  records, face-to-face time with the patient discussing E coli infection,  condition/treatment, and post-visit ordering of testing.

## 2020-09-30 DIAGNOSIS — N12 Tubulo-interstitial nephritis, not specified as acute or chronic: Secondary | ICD-10-CM | POA: Diagnosis not present

## 2020-09-30 DIAGNOSIS — J90 Pleural effusion, not elsewhere classified: Secondary | ICD-10-CM | POA: Diagnosis not present

## 2020-09-30 DIAGNOSIS — N151 Renal and perinephric abscess: Secondary | ICD-10-CM | POA: Diagnosis not present

## 2020-09-30 DIAGNOSIS — R6520 Severe sepsis without septic shock: Secondary | ICD-10-CM | POA: Diagnosis not present

## 2020-10-01 DIAGNOSIS — N151 Renal and perinephric abscess: Secondary | ICD-10-CM | POA: Diagnosis not present

## 2020-10-01 DIAGNOSIS — N12 Tubulo-interstitial nephritis, not specified as acute or chronic: Secondary | ICD-10-CM | POA: Diagnosis not present

## 2020-10-01 DIAGNOSIS — R6520 Severe sepsis without septic shock: Secondary | ICD-10-CM | POA: Diagnosis not present

## 2020-10-01 DIAGNOSIS — J90 Pleural effusion, not elsewhere classified: Secondary | ICD-10-CM | POA: Diagnosis not present

## 2020-10-02 ENCOUNTER — Other Ambulatory Visit: Payer: Self-pay

## 2020-10-02 ENCOUNTER — Telehealth: Payer: Self-pay

## 2020-10-02 ENCOUNTER — Other Ambulatory Visit: Payer: Self-pay | Admitting: Family

## 2020-10-02 MED ORDER — POTASSIUM CHLORIDE CRYS ER 20 MEQ PO TBCR: 20.0000 meq | EXTENDED_RELEASE_TABLET | Freq: Two times a day (BID) | ORAL | 0 refills | Status: DC

## 2020-10-02 NOTE — Progress Notes (Signed)
Received notification that Ms. Tammie [name] has a potassium level 3.1.  Will send in potassium supplementation and plan for recheck.  Marcos Eke, NP 10/02/2020 4:54 PM

## 2020-10-02 NOTE — Telephone Encounter (Signed)
Tammie [name] with [facility] called to report critical lab report for the patient. Patient's potassium 3.1.  Per Tammie Sours, NP he will be sending in potassium for the patient. Tammie [name] has been advised that we will be sending in potassium for the patient.  I have also spoke with patient and advised her that her potassium is low and Tammie [name] will be sending in a potassium supplement.  Patient would like this sent to [facility] in [place], [place]

## 2020-10-02 NOTE — Telephone Encounter (Signed)
Prescription sent to the pharmacy.

## 2020-10-05 DIAGNOSIS — R6520 Severe sepsis without septic shock: Secondary | ICD-10-CM | POA: Diagnosis not present

## 2020-10-05 DIAGNOSIS — J90 Pleural effusion, not elsewhere classified: Secondary | ICD-10-CM | POA: Diagnosis not present

## 2020-10-05 DIAGNOSIS — N12 Tubulo-interstitial nephritis, not specified as acute or chronic: Secondary | ICD-10-CM | POA: Diagnosis not present

## 2020-10-05 DIAGNOSIS — N151 Renal and perinephric abscess: Secondary | ICD-10-CM | POA: Diagnosis not present

## 2020-10-07 DIAGNOSIS — R6520 Severe sepsis without septic shock: Secondary | ICD-10-CM | POA: Diagnosis not present

## 2020-10-07 DIAGNOSIS — J90 Pleural effusion, not elsewhere classified: Secondary | ICD-10-CM | POA: Diagnosis not present

## 2020-10-07 DIAGNOSIS — N151 Renal and perinephric abscess: Secondary | ICD-10-CM | POA: Diagnosis not present

## 2020-10-07 DIAGNOSIS — N12 Tubulo-interstitial nephritis, not specified as acute or chronic: Secondary | ICD-10-CM | POA: Diagnosis not present

## 2020-10-08 ENCOUNTER — Other Ambulatory Visit: Payer: Self-pay

## 2020-10-08 ENCOUNTER — Telehealth: Payer: Self-pay

## 2020-10-08 ENCOUNTER — Other Ambulatory Visit: Payer: Self-pay | Admitting: Urology

## 2020-10-08 ENCOUNTER — Ambulatory Visit
Admission: RE | Admit: 2020-10-08 | Discharge: 2020-10-08 | Disposition: A | Discharge status: Home / Self Care | Payer: BC Managed Care – PPO | Source: Ambulatory Visit | Attending: Urology | Admitting: Urology

## 2020-10-08 ENCOUNTER — Encounter: Payer: Self-pay | Admitting: *Deleted

## 2020-10-08 ENCOUNTER — Ambulatory Visit
Admission: RE | Admit: 2020-10-08 | Discharge: 2020-10-08 | Disposition: A | Discharge status: Home / Self Care | Payer: BC Managed Care – PPO | Source: Ambulatory Visit | Attending: Radiology | Admitting: Radiology

## 2020-10-08 DIAGNOSIS — K6819 Other retroperitoneal abscess: Secondary | ICD-10-CM

## 2020-10-08 DIAGNOSIS — N132 Hydronephrosis with renal and ureteral calculous obstruction: Secondary | ICD-10-CM | POA: Diagnosis not present

## 2020-10-08 DIAGNOSIS — N12 Tubulo-interstitial nephritis, not specified as acute or chronic: Secondary | ICD-10-CM

## 2020-10-08 DIAGNOSIS — N1339 Other hydronephrosis: Secondary | ICD-10-CM | POA: Diagnosis not present

## 2020-10-08 DIAGNOSIS — N151 Renal and perinephric abscess: Secondary | ICD-10-CM | POA: Diagnosis not present

## 2020-10-08 HISTORY — PX: IR RADIOLOGIST EVAL & MGMT: IMG5224

## 2020-10-08 MED ORDER — IOPAMIDOL (ISOVUE-300) INJECTION 61%
100.0000 mL | Freq: Once | INTRAVENOUS | Status: AC | PRN
  Administered 2020-10-08: 100 mL via INTRAVENOUS

## 2020-10-08 NOTE — Progress Notes (Signed)
Referring Physician(s): Dr Thera Flake  Chief Complaint: The patient is seen in follow up today s/p  12/24: L PCN and L pelvic abscess drain placed in IR (Drain #1 to JP) 08/14/20: L perinephric abscess drain placed in IR (Drain #2) 1/27: Exchange/reposistion/upsize L perinephric abscess drain          Exchange/Upsize L retroperitoneal abscess drain 2/16: over 400 cc pus removed from Drain 2 by Dr Vernard Gambles  History of present illness:  Acute emphysematous pyelonephritis with perinephric abscess secondary to E. Coli  Was last seen in [facility] 09/24/20 by Dr Vernard Gambles CT that day :  IMPRESSION: 1. Left nephrostomy catheter is in good position with mild pelvicaliectasis. 2. Minimal interval decrease in size of 7.7 cm posterior perinephric gas and fluid collection with pigtail drain catheter well position in the central aspect. 3. Slight enlargement of multiloculated left retroperitoneal iliopsoas collection, despite drain catheter manipulation.  Dr Vernard Gambles actually removed over 400 cc thick brown purulent material from Drain #2 --Retroperitoneal abscess Evaluated all drains Flushed all drains  Instructions to increase flushing of drain 2 to 10 cc BID Continue 5 cc BID for drain 1 L PCN draining well; looking well  Pt continues to flush Drains 1 and 2 daily: #1: 5 cc BID; Drain #2: 10 cc BID Output from #1 is cloudy brown fluid--- approx 10-15 cc every other day Output from #2 is milky thick and brown---approx 50- 100 cc daily Output from PCN is clear yellow and great OP daily (on JP now)  Denies fever/chills Denies NV Does say she has had pain in low abd occasionally--- new to her Pt still has PICC in place; Husband administers IV antibiotic 3x/day HHRN visits 1-2/x week for home visit and evaluation  Past Medical History:  Diagnosis Date  . Acute pyelonephritis   . Class 1 obesity   . Hypertension   . Lung nodules   . Multifocal pneumonia 07/31/2020  . Perirenal  abscess   . Pleural effusion     Past Surgical History:  Procedure Laterality Date  . ABDOMINAL HYSTERECTOMY    . IR CATHETER TUBE CHANGE  09/04/2020  . IR CATHETER TUBE CHANGE  09/04/2020  . IR RADIOLOGIST EVAL & MGMT  09/24/2020    Allergies: Codeine  Medications: Prior to Admission medications   Medication Sig Start Date End Date Taking? Authorizing Provider  acetaminophen (TYLENOL) 500 MG tablet Take 1,000 mg by mouth every 6 (six) hours as needed for mild pain.    [provider]  blood glucose meter kit and supplies KIT Dispense based on patient and insurance preference. Use up to four times daily as directed. (FOR ICD-9 250.00, 250.01). 08/18/20   Shelly Coss, MD  CINNAMON PO Take 1 tablet by mouth daily.    [provider]  ferrous sulfate 325 (65 FE) MG tablet Take 1 tablet (325 mg total) by mouth daily with breakfast. 09/05/20   Tat, Shanon Brow, MD  insulin glargine (LANTUS SOLOSTAR) 100 UNIT/ML Solostar Pen Inject 8 Units into the skin 2 (two) times daily. 08/18/20   Shelly Coss, MD  Insulin Pen Needle (PEN NEEDLES 3/16") 31G X 5 MM MISC 1 each by Does not apply route 3 (three) times daily. 08/18/20   Shelly Coss, MD  Insulin Regular Human (NOVOLIN R FLEXPEN RELION) 100 UNIT/ML SOPN Sugar 121-150--2 units; 151-200 3 units; 201-250 5 units; 251-300--8 units; 301-350--11 units; 351-400--15 units 09/05/20   Tat, Shanon Brow, MD  potassium chloride SA (KLOR-CON) 20 MEQ  tablet Take 1 tablet (20 mEq total) by mouth 2 (two) times daily. For 3 days 10/02/20   [place], FNP     Family History  Problem Relation Age of Onset  . Huntington's disease Father   . Diabetes Sister     Social History   Socioeconomic History  . Marital status: Married    Spouse name: Not on file  . Number of children: Not on file  . Years of education: Not on file  . Highest education level: Not on file  Occupational History  . Not on file  Tobacco Use  . Smoking status: Never  Smoker  . Smokeless tobacco: Never Used  Vaping Use  . Vaping Use: Never used  Substance and Sexual Activity  . Alcohol use: Not Currently  . Drug use: Not Currently  . Sexual activity: Not on file  Other Topics Concern  . Not on file  Social History Narrative  . Not on file   Social Determinants of Health   Financial Resource Strain: Not on file  Food Insecurity: Not on file  Transportation Needs: Not on file  Physical Activity: Not on file  Stress: Not on file  Social Connections: Not on file     Vital Signs: LMP  (LMP Unknown)   Physical Exam Skin:    General: Skin is warm.     Comments: All sites are clean and dry; NT No bleeding OP from L PCN: clear yellow urine--- changed JP to bag OP from Drain 1: cloudy brown thin fluid-- minimal but 10-15 cc every other day OP from Drain 2: milky thick brown fluid-- copious still; 50-100 cc daily  Neurological:     Mental Status: She is alert.     Imaging: No results found.  Labs:  CBC: Recent Labs    09/03/20 0426 09/04/20 0418 09/05/20 0356 09/10/20 1636  WBC 7.9 7.0 8.3 11.7*  HGB 7.2* 7.7* 7.5* 8.3*  HCT 23.2* 25.2* 24.8* 26.9*  PLT 352 450* 446* 684*    COAGS: Recent Labs    08/14/20 0327 08/31/20 2315  INR 1.0 1.2  APTT  --  34    BMP: Recent Labs    09/03/20 0426 09/04/20 0418 09/05/20 0356 09/10/20 1636  NA 139 138 137 137  K 2.9* 3.1* 3.2* 2.9*  CL 103 104 104 102  CO2 $Re'26 26 25 24  'pMi$ GLUCOSE 143* 105* 97 120*  BUN $Re'6 7 6 14  'LuC$ CALCIUM 8.3* 8.6* 8.7* 8.9  CREATININE 0.54 0.63 0.54 0.86  GFRNONAA >60 >60 >60 >60    LIVER FUNCTION TESTS: Recent Labs    09/01/20 0110 09/01/20 0744 09/03/20 0426 09/10/20 1636  BILITOT 0.6 0.7 0.4 0.5  AST 15 15 10* 12*  ALT $Re'9 8 8 8  'QzZ$ ALKPHOS 207* 182* 162* 190*  PROT 6.5 6.0* 5.9* 7.3  ALBUMIN 2.7* 2.5* 2.3* 3.0*    Assessment:  Acute emphysematous pyelonephritis with perinephric abscess secondary to E. Coli CT today showing great  improvement in collections per Dr Laurence Ferrari Pt with L PCN intact-- now to Bag Drain #1: perinephric abscess: minimal drainage-- new instruction: increase flush to 10 cc BID Drain #2: retroperitoneal abscess:  -- decrease flush 5 cc BID She understands all instructions Follow up in OP IR Clinic 2 weeks for CT- possible drain removals She is to follow with Dr Alyson Ingles--- his office will call pt.   Signed: Lavonia Drafts, PA-C 10/08/2020, 1:16 PM   Please refer to Dr. Dr Laurence Ferrari  attestation of this note for management and plan.

## 2020-10-08 NOTE — Telephone Encounter (Signed)
precert started for surgery inpatient on 10/28/2020 Clinicals faxed to [phone]  Pending ID number [id number]

## 2020-10-09 ENCOUNTER — Telehealth: Payer: Self-pay

## 2020-10-09 NOTE — Telephone Encounter (Signed)
I called the patient to give pre-op appt and surgery date for Dr. Ronne Binning surgery for left nephrectomy.   Patient would like to see Dr. Ronne Binning prior to surgery. appt given for 3/4 at 2:15pm

## 2020-10-10 ENCOUNTER — Other Ambulatory Visit: Payer: Self-pay

## 2020-10-10 ENCOUNTER — Ambulatory Visit (INDEPENDENT_AMBULATORY_CARE_PROVIDER_SITE_OTHER): Payer: BC Managed Care – PPO | Admitting: Urology

## 2020-10-10 VITALS — BP 128/60 | HR 89 | Temp 98.6°F | Ht 61.0 in | Wt 163.0 lb

## 2020-10-10 DIAGNOSIS — N200 Calculus of kidney: Secondary | ICD-10-CM

## 2020-10-10 LAB — URINALYSIS, ROUTINE W REFLEX MICROSCOPIC
Bilirubin, UA: NEGATIVE
Glucose, UA: NEGATIVE
Ketones, UA: NEGATIVE
Nitrite, UA: NEGATIVE
Protein,UA: NEGATIVE
Specific Gravity, UA: 1.01 (ref 1.005–1.030)
Urobilinogen, Ur: 0.2 mg/dL (ref 0.2–1.0)
pH, UA: 6.5 (ref 5.0–7.5)

## 2020-10-10 LAB — MICROSCOPIC EXAMINATION
Renal Epithel, UA: NONE SEEN /hpf
WBC, UA: >30 /hpf — AB (ref 0–5)

## 2020-10-10 NOTE — Progress Notes (Signed)
Urological Symptom Review  Patient is experiencing the following symptoms: Frequent urination Hard to postpone urination Get up at night to urinate Kidney stones  Review of Systems  Gastrointestinal (upper)  : Vomiting  Gastrointestinal (lower) : Negative for lower GI symptoms  Constitutional : Fever Night Sweats  Skin: Negative for skin symptoms  Eyes: Blurred vision  Ear/Nose/Throat : Negative for Ear/Nose/Throat symptoms  Hematologic/Lymphatic: Negative for Hematologic/Lymphatic symptoms  Cardiovascular : Negative for cardiovascular symptoms  Respiratory : Negative for respiratory symptoms  Endocrine: Excessive thirst  Musculoskeletal: Negative for musculoskeletal symptoms  Neurological: Negative for neurological symptoms  Psychologic: Negative for psychiatric symptoms

## 2020-10-10 NOTE — Progress Notes (Signed)
10/10/2020 2:54 PM   Tammie [name] 10-Dec-1960 [id number]  Referring provider: No referring provider defined for this encounter.  followup left ephysematous pyelonephritis  HPI: [name] is a 60yo here for followup for emphysematous pyelonephritis. SHe is scheduled for nephrectomy on 3/22. She had a CT on 09/24/2020 which showed persistent perinephric abscess. She underwent CT 2 days ago which showed resolution of the renal abscess.    PMH: Past Medical History:  Diagnosis Date  . Acute pyelonephritis   . Class 1 obesity   . Hypertension   . Lung nodules   . Multifocal pneumonia 07/31/2020  . Perirenal abscess   . Pleural effusion     Surgical History: Past Surgical History:  Procedure Laterality Date  . ABDOMINAL HYSTERECTOMY    . IR CATHETER TUBE CHANGE  09/04/2020  . IR CATHETER TUBE CHANGE  09/04/2020  . IR RADIOLOGIST EVAL & MGMT  09/24/2020  . IR RADIOLOGIST EVAL & MGMT  10/08/2020    Home Medications:  Allergies as of 10/10/2020      Reactions   Codeine Rash      Medication List       Accurate as of October 10, 2020  2:54 PM. If you have any questions, ask your nurse or doctor.        acetaminophen 500 MG tablet Commonly known as: TYLENOL Take 1,000 mg by mouth every 6 (six) hours as needed for mild pain.   blood glucose meter kit and supplies Kit Dispense based on patient and insurance preference. Use up to four times daily as directed. (FOR ICD-9 250.00, 250.01).   CINNAMON PO Take 1 tablet by mouth daily.   ferrous sulfate 325 (65 FE) MG tablet Take 1 tablet (325 mg total) by mouth daily with breakfast.   HumaLOG KwikPen 100 UNIT/ML KwikPen Generic drug: insulin lispro Inject into the skin.   Lantus SoloStar 100 UNIT/ML Solostar Pen Generic drug: insulin glargine Inject 8 Units into the skin 2 (two) times daily.   NovoLIN R FlexPen ReliOn 100 UNIT/ML Sopn Generic drug: Insulin Regular Human Sugar 121-150--2 units; 151-200 3 units; 201-250 5  units; 251-300--8 units; 301-350--11 units; 351-400--15 units   Pen Needles 3/16" 31G X 5 MM Misc 1 each by Does not apply route 3 (three) times daily.   potassium chloride SA 20 MEQ tablet Commonly known as: KLOR-CON Take 1 tablet (20 mEq total) by mouth 2 (two) times daily. For 3 days       Allergies:  Allergies  Allergen Reactions  . Codeine Rash    Family History: Family History  Problem Relation Age of Onset  . Huntington's disease Father   . Diabetes Sister     Social History:  reports that she has never smoked. She has never used smokeless tobacco. She reports previous alcohol use. She reports previous drug use.  ROS: All other review of systems were reviewed and are negative except what is noted above in HPI  Physical Exam: BP 128/60   Pulse 89   Temp 98.6 F (37 C)   Ht _0  (1.549 m)   Wt 163 lb (73.9 kg)   LMP  (LMP Unknown)   BMI 30.80 kg/m   Constitutional:  Alert and oriented, No acute distress. HEENT: [place] AT, moist mucus membranes.  Trachea midline, no masses. Cardiovascular: No clubbing, cyanosis, or edema. Respiratory: Normal respiratory effort, no increased work of breathing. GI: Abdomen is soft, nontender, nondistended, no abdominal masses GU: No CVA tenderness.  Lymph:  No cervical or inguinal lymphadenopathy. Skin: No rashes, bruises or suspicious lesions. Neurologic: Grossly intact, no focal deficits, moving all 4 extremities. Psychiatric: Normal mood and affect.  Laboratory Data: Lab Results  Component Value Date   WBC 11.7 (H) 09/10/2020   HGB 8.3 (L) 09/10/2020   HCT 26.9 (L) 09/10/2020   MCV 93.1 09/10/2020   PLT 684 (H) 09/10/2020    Lab Results  Component Value Date   CREATININE 0.86 09/10/2020    No results found for: PSA  No results found for: TESTOSTERONE  Lab Results  Component Value Date   HGBA1C 12.2 (H) 08/01/2020    Urinalysis    Component Value Date/Time   COLORURINE AMBER (A) 09/01/2020 0005    APPEARANCEUR Hazy (A) 10/10/2020 1420   LABSPEC 1.006 09/01/2020 0005   PHURINE 6.0 09/01/2020 0005   GLUCOSEU Negative 10/10/2020 1420   HGBUR MODERATE (A) 09/01/2020 0005   BILIRUBINUR Negative 10/10/2020 1420   KETONESUR NEGATIVE 09/01/2020 0005   PROTEINUR Negative 10/10/2020 1420   PROTEINUR 100 (A) 09/01/2020 0005   NITRITE Negative 10/10/2020 1420   NITRITE NEGATIVE 09/01/2020 0005   LEUKOCYTESUR 3+ (A) 10/10/2020 1420   LEUKOCYTESUR MODERATE (A) 09/01/2020 0005    Lab Results  Component Value Date   LABMICR See below: 10/10/2020   WBCUA >30 (A) 10/10/2020   LABEPIT 0-10 10/10/2020   BACTERIA Many (A) 10/10/2020    Pertinent Imaging: CT abd 10/08/2020: Images reviewed and discussed with the patient No results found for this or any previous visit.  No results found for this or any previous visit.  No results found for this or any previous visit.  No results found for this or any previous visit.  No results found for this or any previous visit.  No results found for this or any previous visit.  No results found for this or any previous visit.  Results for orders placed during the hospital encounter of 07/31/20  CT RENAL STONE STUDY  Narrative CLINICAL DATA:  60 year old female with pyelonephritis and renal abscess. Follow-up CT.  EXAM: CT ABDOMEN AND PELVIS WITHOUT CONTRAST  TECHNIQUE: Multidetector CT imaging of the abdomen and pelvis was performed following the standard protocol without IV contrast.  COMPARISON:  CT abdomen pelvis dated 08/13/2020.  FINDINGS: Evaluation of this exam is limited in the absence of intravenous contrast.  Lower chest: Partially visualized small bilateral pleural effusions and consolidative changes of the lung bases as seen previously. Several pulmonary nodules measure 7 mm in the right middle lobe.  No intra-abdominal free air or free fluid.  Hepatobiliary: No focal liver abnormality is seen. No  gallstones, gallbladder wall thickening, or biliary dilatation.  Pancreas: Unremarkable. No pancreatic ductal dilatation or surrounding inflammatory changes.  Spleen: Normal in size without focal abnormality.  Adrenals/Urinary Tract: The adrenal glands unremarkable. Left percutaneous nephrostomy with pigtail tip in the left renal pelvis in similar position. There has been interval placement a percutaneous drainage catheter with pigtail tip in the posterior perinephric collection. Slight interval decrease in the size of the perinephric collection compared to prior CT. There is no hydronephrosis or nephrolithiasis on either side. The right kidney is unremarkable. The urinary bladder is decompressed around a Foley catheter.  Stomach/Bowel: There is no bowel obstruction or active inflammation. The appendix is not visualized with certainty. No inflammatory changes identified in the right lower quadrant.  Vascular/Lymphatic: Mild aortoiliac atherosclerotic disease. The IVC is unremarkable. No portal venous gas. There is no adenopathy.  Reproductive: The uterus and  ovaries are grossly unremarkable.  Other: Mild diffuse subcutaneous edema. There has been interval placement of a percutaneous drainage catheter with tip in the fluid collection along the anterior aspect of the left psoas muscle. The collection has decreased in size now measuring approximately 4.2 x 8.0 cm (previously 4.4 x 8.7 cm).  Musculoskeletal: No acute or significant osseous findings.  IMPRESSION: 1. Interval placement of a percutaneous drainage catheter with tip in the posterior perinephric collection. Slight interval decrease in the size of the fluid collection compared to prior CT. 2. Decrease in the size of the collection anterior to the left psoas muscle status post percutaneous drainage catheter placement. Near 3. No hydronephrosis or nephrolithiasis. 4. No bowel obstruction. 5. Several pulmonary nodules  measure 7 mm in the right middle lobe. 6. Aortic Atherosclerosis (ICD10-I70.0).   Electronically Signed By: Anner Crete M.D. On: 08/16/2020 20:27   Assessment & Plan:    1. Left emphysematous pyelonephritis -RTC 2 weeks with repeat CT. If the left kindey has not improved then we will proceed with left open simple nephrectomy on 10/28/2020 - Urinalysis, Routine w reflex microscopic   No follow-ups on file.  Nicolette Bang, MD  [facility] [place]

## 2020-10-12 DIAGNOSIS — N151 Renal and perinephric abscess: Secondary | ICD-10-CM | POA: Diagnosis not present

## 2020-10-12 DIAGNOSIS — R6520 Severe sepsis without septic shock: Secondary | ICD-10-CM | POA: Diagnosis not present

## 2020-10-12 DIAGNOSIS — J90 Pleural effusion, not elsewhere classified: Secondary | ICD-10-CM | POA: Diagnosis not present

## 2020-10-12 DIAGNOSIS — N12 Tubulo-interstitial nephritis, not specified as acute or chronic: Secondary | ICD-10-CM | POA: Diagnosis not present

## 2020-10-14 ENCOUNTER — Encounter: Payer: Self-pay | Admitting: Urology

## 2020-10-14 DIAGNOSIS — R6520 Severe sepsis without septic shock: Secondary | ICD-10-CM | POA: Diagnosis not present

## 2020-10-14 DIAGNOSIS — N12 Tubulo-interstitial nephritis, not specified as acute or chronic: Secondary | ICD-10-CM | POA: Diagnosis not present

## 2020-10-14 DIAGNOSIS — J90 Pleural effusion, not elsewhere classified: Secondary | ICD-10-CM | POA: Diagnosis not present

## 2020-10-14 DIAGNOSIS — N151 Renal and perinephric abscess: Secondary | ICD-10-CM | POA: Diagnosis not present

## 2020-10-14 NOTE — Patient Instructions (Signed)
Pyelonephritis, Adult    Pyelonephritis is an infection that occurs in the kidney. The kidneys are organs that help clean the blood by moving waste out of the blood and into the pee (urine). This infection can happen quickly, or it can last for a long time. In most cases, it clears up with treatment and does not cause other problems.  What are the causes?  This condition may be caused by:  · Germs (bacteria) going from the bladder up to the kidney. This may happen after having a bladder infection.  · Germs going from the blood to the kidney.  What increases the risk?  This condition is more likely to develop in:  · Pregnant women.  · Older people.  · People who have any of these conditions:  ? Diabetes.  ? Inflammation of the prostate gland (prostatitis), in males.  ? Kidney stones or bladder stones.  ? Other problems with the kidney or the parts of your body that carry pee from the kidneys to the bladder (ureters).  ? Cancer.  · People who have a small, thin tube (catheter) placed in the bladder.  · People who are sexually active.  · Women who use a medicine that kills sperm (spermicide) to prevent pregnancy.  · People who have had a prior urinary tract infection (UTI).  What are the signs or symptoms?  Symptoms of this condition include:  · Peeing often.  · A strong urge to pee right away.  · Burning or stinging when peeing.  · Belly pain.  · Back pain.  · Pain in the side (flank area).  · Fever or chills.  · Blood in the pee, or dark pee.  · Feeling sick to your stomach (nauseous) or throwing up (vomiting).  How is this treated?  This condition may be treated by:  · Taking antibiotic medicines by mouth (orally).  · Drinking enough fluids.  If the infection is bad, you may need to stay in the hospital. You may be given antibiotics and fluids that are put directly into a vein through an IV tube.  In some cases, other treatments may be needed.  Follow these instructions at home:  Medicines  · Take your antibiotic  medicine as told by your doctor. Do not stop taking the antibiotic even if you start to feel better.  · Take over-the-counter and prescription medicines only as told by your doctor.  General instructions    · Drink enough fluid to keep your pee pale yellow.  · Avoid caffeine, tea, and carbonated drinks.  · Pee (urinate) often. Avoid holding in pee for long periods of time.  · Pee before and after sex.  · After pooping (having a bowel movement), women should wipe from front to back. Use each tissue only once.  · Keep all follow-up visits as told by your doctor. This is important.  Contact a doctor if:  · You do not feel better after 2 days.  · Your symptoms get worse.  · You have a fever.  Get help right away if:  · You cannot take your medicine or drink fluids as told.  · You have chills and shaking.  · You throw up.  · You have very bad pain in your side or back.  · You feel very weak or you pass out (faint).  Summary  · Pyelonephritis is an infection that occurs in the kidney.  · In most cases, this infection clears up with treatment and does   not cause other problems.  · Take your antibiotic medicine as told by your doctor. Do not stop taking the antibiotic even if you start to feel better.  · Drink enough fluid to keep your pee pale yellow.  This information is not intended to replace advice given to you by your health care provider. Make sure you discuss any questions you have with your health care provider.  Document Revised: 05/30/2018 Document Reviewed: 05/30/2018  Elsevier Patient Education © 2021 [employer]

## 2020-10-15 ENCOUNTER — Other Ambulatory Visit: Payer: Self-pay | Admitting: Internal Medicine

## 2020-10-15 ENCOUNTER — Other Ambulatory Visit: Payer: Self-pay

## 2020-10-15 NOTE — Progress Notes (Signed)
Outside labs reviewed  [facility]   2/24 Cr 0.6 Cbc 9.8/7.6/637  2/15 Cr 0.6 Cbc 11/7.4/545   Crp/esr: 2/24   7.8 (<0.3)  /42   Patient with emphysematous pyelonephritis, awaiting kidney resection ecoli growing from previous abscess primary kidney source on cefazolin

## 2020-10-19 DIAGNOSIS — J90 Pleural effusion, not elsewhere classified: Secondary | ICD-10-CM | POA: Diagnosis not present

## 2020-10-19 DIAGNOSIS — R6520 Severe sepsis without septic shock: Secondary | ICD-10-CM | POA: Diagnosis not present

## 2020-10-19 DIAGNOSIS — N12 Tubulo-interstitial nephritis, not specified as acute or chronic: Secondary | ICD-10-CM | POA: Diagnosis not present

## 2020-10-19 DIAGNOSIS — N151 Renal and perinephric abscess: Secondary | ICD-10-CM | POA: Diagnosis not present

## 2020-10-21 DIAGNOSIS — N151 Renal and perinephric abscess: Secondary | ICD-10-CM | POA: Diagnosis not present

## 2020-10-21 DIAGNOSIS — J90 Pleural effusion, not elsewhere classified: Secondary | ICD-10-CM | POA: Diagnosis not present

## 2020-10-21 DIAGNOSIS — R6520 Severe sepsis without septic shock: Secondary | ICD-10-CM | POA: Diagnosis not present

## 2020-10-21 DIAGNOSIS — N12 Tubulo-interstitial nephritis, not specified as acute or chronic: Secondary | ICD-10-CM | POA: Diagnosis not present

## 2020-10-22 ENCOUNTER — Other Ambulatory Visit: Payer: Self-pay

## 2020-10-22 ENCOUNTER — Encounter: Payer: Self-pay | Admitting: *Deleted

## 2020-10-22 ENCOUNTER — Ambulatory Visit
Admission: RE | Admit: 2020-10-22 | Discharge: 2020-10-22 | Disposition: A | Discharge status: Home / Self Care | Payer: BC Managed Care – PPO | Source: Ambulatory Visit | Attending: Urology | Admitting: Urology

## 2020-10-22 DIAGNOSIS — K6819 Other retroperitoneal abscess: Secondary | ICD-10-CM

## 2020-10-22 DIAGNOSIS — Z8709 Personal history of other diseases of the respiratory system: Secondary | ICD-10-CM | POA: Diagnosis not present

## 2020-10-22 DIAGNOSIS — N12 Tubulo-interstitial nephritis, not specified as acute or chronic: Secondary | ICD-10-CM

## 2020-10-22 DIAGNOSIS — Z87448 Personal history of other diseases of urinary system: Secondary | ICD-10-CM | POA: Diagnosis not present

## 2020-10-22 DIAGNOSIS — I700 Atherosclerosis of aorta: Secondary | ICD-10-CM | POA: Diagnosis not present

## 2020-10-22 HISTORY — PX: IR RADIOLOGIST EVAL & MGMT: IMG5224

## 2020-10-22 MED ORDER — IOPAMIDOL (ISOVUE-300) INJECTION 61%
100.0000 mL | Freq: Once | INTRAVENOUS | Status: AC | PRN
  Administered 2020-10-22: 100 mL via INTRAVENOUS

## 2020-10-22 NOTE — Progress Notes (Signed)
Referring Physician(s): McKenzie,Patrick L  Chief Complaint: The patient is seen in follow up today s/p left sided nephrostomy tube and a left pelvic abscess drain on 12.24.21 and  perinephric abscess drain placed on 1.6.22  History of present illness: 60 y.o. female outpatient. History of left sided emphysematous pylenephritis with obstructing calculi and retroperitoneal abscess. IR placed a left sided nephrostomy tube and a left pelvic abscess drain on 12.24.21. She was later found to have a perinephric abscess and IR placed a perinephric abscess drain with aspiration of purulent output on 1.6.22. Cultures grew e.coli. Continues on IV antibiotics via PICC line tid. CT and Nephrostogram and Abscessogram performed on 3.2.22 showed improving abscesses. The left sided nephrostomy tube was placed a gravity bag.. Tammie [name] is scheduled for a repeat CT. If no improvement she will be scheduled for a left sided nephrectomy with Dr. Alyson Ingles on 3.22.22.  Patient presents today for retroperitoneal and perinephric abscesses drain follow up. Patient denies headache, SOB, cough, chest pain, nausea, vomiting or bleeding. Highest temperature in the last week was 101.0 which Tammie [name] which  describes as baseline. She is currently being followed by Infectious Disease and is taking cefazolin IV TID.   Past Medical History:  Diagnosis Date  . Acute pyelonephritis   . Class 1 obesity   . Hypertension   . Lung nodules   . Multifocal pneumonia 07/31/2020  . Perirenal abscess   . Pleural effusion     Past Surgical History:  Procedure Laterality Date  . ABDOMINAL HYSTERECTOMY    . IR CATHETER TUBE CHANGE  09/04/2020  . IR CATHETER TUBE CHANGE  09/04/2020  . IR RADIOLOGIST EVAL & MGMT  09/24/2020  . IR RADIOLOGIST EVAL & MGMT  10/08/2020    Allergies: Codeine  Medications: Prior to Admission medications   Medication Sig Start Date End Date Taking? Authorizing Provider  acetaminophen (TYLENOL) 500 MG  tablet Take 1,000 mg by mouth every 6 (six) hours as needed for mild pain.    [provider]  blood glucose meter kit and supplies KIT Dispense based on patient and insurance preference. Use up to four times daily as directed. (FOR ICD-9 250.00, 250.01). 08/18/20   Shelly Coss, MD  CINNAMON PO Take 1 tablet by mouth daily.    [provider]  ferrous sulfate 325 (65 FE) MG tablet Take 1 tablet (325 mg total) by mouth daily with breakfast. 09/05/20   Tat, Shanon Brow, MD  HUMALOG KWIKPEN 100 UNIT/ML KwikPen Inject 0-5 Units into the skin 3 (three) times daily as needed (high blood sugar). Per sliding scale 09/04/20   [provider]  insulin glargine (LANTUS SOLOSTAR) 100 UNIT/ML Solostar Pen Inject 8 Units into the skin 2 (two) times daily. 08/18/20   Shelly Coss, MD  Insulin Pen Needle (PEN NEEDLES 3/16") 31G X 5 MM MISC 1 each by Does not apply route 3 (three) times daily. 08/18/20   Shelly Coss, MD  Insulin Regular Human (NOVOLIN R FLEXPEN RELION) 100 UNIT/ML SOPN Sugar 121-150--2 units; 151-200 3 units; 201-250 5 units; 251-300--8 units; 301-350--11 units; 351-400--15 units Patient not taking: Reported on 10/13/2020 09/05/20   Tat, Shanon Brow, MD  potassium chloride SA (KLOR-CON) 20 MEQ tablet Take 1 tablet (20 mEq total) by mouth 2 (two) times daily. For 3 days Patient not taking: Reported on 10/13/2020 10/02/20   Golden Circle, FNP     Family History  Problem Relation Age of Onset  . Huntington's disease Father   . Diabetes  Sister     Social History   Socioeconomic History  . Marital status: Married    Spouse name: Not on file  . Number of children: Not on file  . Years of education: Not on file  . Highest education level: Not on file  Occupational History  . Not on file  Tobacco Use  . Smoking status: Never Smoker  . Smokeless tobacco: Never Used  Vaping Use  . Vaping Use: Never used  Substance and Sexual Activity  . Alcohol use: Not Currently  . Drug  use: Not Currently  . Sexual activity: Not on file  Other Topics Concern  . Not on file  Social History Narrative  . Not on file   Social Determinants of Health   Financial Resource Strain: Not on file  Food Insecurity: Not on file  Transportation Needs: Not on file  Physical Activity: Not on file  Stress: Not on file  Social Connections: Not on file     Vital Signs: LMP  (LMP Unknown)   Physical Exam Vitals and nursing note reviewed.  Constitutional:      Appearance: She is well-developed.  HENT:     Head: Normocephalic and atraumatic.  Eyes:     Conjunctiva/sclera: Conjunctivae normal.  Pulmonary:     Effort: Pulmonary effort is normal.  Abdominal:     Comments: Superior retroperitoneal abscess drain with 5 ml of light tan output in gravity bag. Inferior perinephric abscess drain with 10 of dark tan - brown output. Patient states that both drains are easily flushed.   Musculoskeletal:        General: Normal range of motion.     Cervical back: Normal range of motion.  Skin:    General: Skin is warm.  Neurological:     Mental Status: She is alert and oriented to person, place, and time.     Imaging: No results found.  Labs:  CBC: Recent Labs    09/03/20 0426 09/04/20 0418 09/05/20 0356 09/10/20 1636  WBC 7.9 7.0 8.3 11.7*  HGB 7.2* 7.7* 7.5* 8.3*  HCT 23.2* 25.2* 24.8* 26.9*  PLT 352 450* 446* 684*    COAGS: Recent Labs    08/14/20 0327 08/31/20 2315  INR 1.0 1.2  APTT  --  34    BMP: Recent Labs    09/03/20 0426 09/04/20 0418 09/05/20 0356 09/10/20 1636  NA 139 138 137 137  K 2.9* 3.1* 3.2* 2.9*  CL 103 104 104 102  CO2 $Re'26 26 25 24  'Aux$ GLUCOSE 143* 105* 97 120*  BUN $Re'6 7 6 14  'Pyu$ CALCIUM 8.3* 8.6* 8.7* 8.9  CREATININE 0.54 0.63 0.54 0.86  GFRNONAA >60 >60 >60 >60    LIVER FUNCTION TESTS: Recent Labs    09/01/20 0110 09/01/20 0744 09/03/20 0426 09/10/20 1636  BILITOT 0.6 0.7 0.4 0.5  AST 15 15 10* 12*  ALT $Re'9 8 8 8  'eTw$ ALKPHOS  207* 182* 162* 190*  PROT 6.5 6.0* 5.9* 7.3  ALBUMIN 2.7* 2.5* 2.3* 3.0*    Assessment: 60 y.o. female outpatient. History of left sided emphysematous pylenephritis with obstructing calculi and retroperitoneal abscess. IR placed a left sided nephrostomy tube and a left pelvic abscess drain on 12.24.21. She was later found to have a perinephric abscess and IR placed a perinephric abscess drain with aspiration of purulent output on 1.6.22. Cultures grew e.coli. Continues on IV antibiotics via PICC line tid. CT and Nephrostogram and Abscessogram performed on 3.2.22 showed improving abscesses. The left  sided nephrostomy tube was placed a gravity bag.. Tammie [name] is scheduled for a repeat CT. If no improvement she will be scheduled for a left sided nephrectomy with Dr. Alyson Ingles on 3.22.22.  Tammie. [name] presents today for drain follow-up. She continues to be followed by ID and is currently on Cefazolin IV.Marland Kitchen She is flushing the superior retroperitoneal abscess drain (drain # 1) 10 ml per day and the  inferior perinephric abscess drain (drain #2) 5 ml's per day.  Output has been 5 ml daily for the retroperitoneal drain (drain #1 )  and 50 ml daily for the perinephric abscess drain (drain #2).   CT abdomen Pelvis performed today demonstrates near resolution of the perinephric abscess. Output noted to be in both gravity bags with the retroperitoneal abscess drain being light tan in color and the perinephric abscess drain being dark tan/ brown in nature. Tammie [name] reports a fever of 101.0 which she describes as baseline.  Images were reviewed by Dr. Laurence Ferrari. As Tammie [name] continues to be febrile with purulent looking output noted to both abscess drains recommend she continue current drain site care protocol . Recommend follow-up with Dr. Alyson Ingles to further discuss surgical options at this stage.   Signed: Jacqualine Mau, NP 10/22/2020, 12:48 PM   Please refer to Dr. Laurence Ferrari attestation of this note for  management and plan.

## 2020-10-24 ENCOUNTER — Encounter: Payer: Self-pay | Admitting: Urology

## 2020-10-24 ENCOUNTER — Encounter (HOSPITAL_COMMUNITY)
Admission: RE | Admit: 2020-10-24 | Discharge: 2020-10-24 | Disposition: A | Discharge status: Home / Self Care | Payer: BC Managed Care – PPO | Source: Ambulatory Visit | Attending: Urology | Admitting: Urology

## 2020-10-24 ENCOUNTER — Ambulatory Visit (INDEPENDENT_AMBULATORY_CARE_PROVIDER_SITE_OTHER): Payer: BC Managed Care – PPO | Admitting: Urology

## 2020-10-24 ENCOUNTER — Other Ambulatory Visit (HOSPITAL_COMMUNITY)
Admission: RE | Admit: 2020-10-24 | Discharge: 2020-10-24 | Disposition: A | Discharge status: Home / Self Care | Payer: BC Managed Care – PPO | Source: Ambulatory Visit | Attending: Urology | Admitting: Urology

## 2020-10-24 ENCOUNTER — Other Ambulatory Visit: Payer: Self-pay

## 2020-10-24 ENCOUNTER — Encounter (HOSPITAL_COMMUNITY): Payer: Self-pay

## 2020-10-24 VITALS — BP 174/78 | HR 87 | Temp 98.8°F | Ht 60.0 in | Wt 151.0 lb

## 2020-10-24 DIAGNOSIS — N151 Renal and perinephric abscess: Secondary | ICD-10-CM

## 2020-10-24 DIAGNOSIS — N200 Calculus of kidney: Secondary | ICD-10-CM

## 2020-10-24 DIAGNOSIS — Z20822 Contact with and (suspected) exposure to covid-19: Secondary | ICD-10-CM | POA: Insufficient documentation

## 2020-10-24 DIAGNOSIS — Z01812 Encounter for preprocedural laboratory examination: Secondary | ICD-10-CM | POA: Insufficient documentation

## 2020-10-24 HISTORY — DX: Other specified postprocedural states: Z98.890

## 2020-10-24 HISTORY — DX: Type 2 diabetes mellitus without complications: E11.9

## 2020-10-24 HISTORY — DX: Unspecified osteoarthritis, unspecified site: M19.90

## 2020-10-24 LAB — BASIC METABOLIC PANEL
Anion gap: 12 (ref 5–15)
BUN: 9 mg/dL (ref 6–20)
CO2: 25 mmol/L (ref 22–32)
Calcium: 9.7 mg/dL (ref 8.9–10.3)
Chloride: 100 mmol/L (ref 98–111)
Creatinine, Ser: 0.6 mg/dL (ref 0.44–1.00)
GFR, Estimated: >60 mL/min (ref >60)
Glucose, Bld: 104 mg/dL — ABNORMAL HIGH (ref 70–99)
Potassium: 3.6 mmol/L (ref 3.5–5.1)
Sodium: 137 mmol/L (ref 135–145)

## 2020-10-24 LAB — SARS CORONAVIRUS 2 (TAT 6-24 HRS): SARS Coronavirus 2: NEGATIVE

## 2020-10-24 LAB — MICROSCOPIC EXAMINATION
Epithelial Cells (non renal): >10 /hpf — AB (ref 0–10)
Renal Epithel, UA: NONE SEEN /hpf
WBC, UA: >30 /hpf — AB (ref 0–5)

## 2020-10-24 LAB — URINALYSIS, ROUTINE W REFLEX MICROSCOPIC
Bilirubin, UA: NEGATIVE
Glucose, UA: NEGATIVE
Ketones, UA: NEGATIVE
Nitrite, UA: NEGATIVE
Specific Gravity, UA: 1.015 (ref 1.005–1.030)
Urobilinogen, Ur: 0.2 mg/dL (ref 0.2–1.0)
pH, UA: 6.5 (ref 5.0–7.5)

## 2020-10-24 LAB — CBC
HCT: 32 % — ABNORMAL LOW (ref 36.0–46.0)
Hemoglobin: 9.6 g/dL — ABNORMAL LOW (ref 12.0–15.0)
MCH: 25.1 pg — ABNORMAL LOW (ref 26.0–34.0)
MCHC: 30 g/dL (ref 30.0–36.0)
MCV: 83.6 fL (ref 80.0–100.0)
Platelets: 630 10*3/uL — ABNORMAL HIGH (ref 150–400)
RBC: 3.83 MIL/uL — ABNORMAL LOW (ref 3.87–5.11)
RDW: 14.8 % (ref 11.5–15.5)
WBC: 9.8 10*3/uL (ref 4.0–10.5)
nRBC: 0 % (ref 0.0–0.2)

## 2020-10-24 LAB — PREPARE RBC (CROSSMATCH)

## 2020-10-24 MED ORDER — SODIUM CHLORIDE 0.9% IV SOLUTION: Freq: Once | INTRAVENOUS | Status: DC

## 2020-10-24 NOTE — Patient Instructions (Signed)
Pyelonephritis, Adult    Pyelonephritis is an infection that occurs in the kidney. The kidneys are organs that help clean the blood by moving waste out of the blood and into the pee (urine). This infection can happen quickly, or it can last for a long time. In most cases, it clears up with treatment and does not cause other problems.  What are the causes?  This condition may be caused by:  · Germs (bacteria) going from the bladder up to the kidney. This may happen after having a bladder infection.  · Germs going from the blood to the kidney.  What increases the risk?  This condition is more likely to develop in:  · Pregnant women.  · Older people.  · People who have any of these conditions:  ? Diabetes.  ? Inflammation of the prostate gland (prostatitis), in males.  ? Kidney stones or bladder stones.  ? Other problems with the kidney or the parts of your body that carry pee from the kidneys to the bladder (ureters).  ? Cancer.  · People who have a small, thin tube (catheter) placed in the bladder.  · People who are sexually active.  · Women who use a medicine that kills sperm (spermicide) to prevent pregnancy.  · People who have had a prior urinary tract infection (UTI).  What are the signs or symptoms?  Symptoms of this condition include:  · Peeing often.  · A strong urge to pee right away.  · Burning or stinging when peeing.  · Belly pain.  · Back pain.  · Pain in the side (flank area).  · Fever or chills.  · Blood in the pee, or dark pee.  · Feeling sick to your stomach (nauseous) or throwing up (vomiting).  How is this treated?  This condition may be treated by:  · Taking antibiotic medicines by mouth (orally).  · Drinking enough fluids.  If the infection is bad, you may need to stay in the hospital. You may be given antibiotics and fluids that are put directly into a vein through an IV tube.  In some cases, other treatments may be needed.  Follow these instructions at home:  Medicines  · Take your antibiotic  medicine as told by your doctor. Do not stop taking the antibiotic even if you start to feel better.  · Take over-the-counter and prescription medicines only as told by your doctor.  General instructions    · Drink enough fluid to keep your pee pale yellow.  · Avoid caffeine, tea, and carbonated drinks.  · Pee (urinate) often. Avoid holding in pee for long periods of time.  · Pee before and after sex.  · After pooping (having a bowel movement), women should wipe from front to back. Use each tissue only once.  · Keep all follow-up visits as told by your doctor. This is important.  Contact a doctor if:  · You do not feel better after 2 days.  · Your symptoms get worse.  · You have a fever.  Get help right away if:  · You cannot take your medicine or drink fluids as told.  · You have chills and shaking.  · You throw up.  · You have very bad pain in your side or back.  · You feel very weak or you pass out (faint).  Summary  · Pyelonephritis is an infection that occurs in the kidney.  · In most cases, this infection clears up with treatment and does   not cause other problems.  · Take your antibiotic medicine as told by your doctor. Do not stop taking the antibiotic even if you start to feel better.  · Drink enough fluid to keep your pee pale yellow.  This information is not intended to replace advice given to you by your health care provider. Make sure you discuss any questions you have with your health care provider.  Document Revised: 05/30/2018 Document Reviewed: 05/30/2018  Elsevier Patient Education © 2021 [employer]

## 2020-10-24 NOTE — Progress Notes (Signed)
10/24/2020 9:12 AM   Tammie [name] [birth year] [id number]  Referring provider: No referring provider defined for this encounter.  Left emphysemtous pyelonephritis  HPI: Tammie [name] is a 60yo here for followup for left emphysematous pyelonephritis. She is having left flank pain that is constant, sharp, mild to moderate. No fevers. She is on antibiotics per infectious disease   PMH: Past Medical History:  Diagnosis Date  . Acute pyelonephritis   . Class 1 obesity   . Hypertension   . Lung nodules   . Multifocal pneumonia 07/31/2020  . Perirenal abscess   . Pleural effusion     Surgical History: Past Surgical History:  Procedure Laterality Date  . ABDOMINAL HYSTERECTOMY    . IR CATHETER TUBE CHANGE  09/04/2020  . IR CATHETER TUBE CHANGE  09/04/2020  . IR RADIOLOGIST EVAL & MGMT  09/24/2020  . IR RADIOLOGIST EVAL & MGMT  10/08/2020  . IR RADIOLOGIST EVAL & MGMT  10/22/2020    Home Medications:  Allergies as of 10/24/2020      Reactions   Codeine Rash      Medication List       Accurate as of October 24, 2020  9:12 AM. If you have any questions, ask your nurse or doctor.        acetaminophen 500 MG tablet Commonly known as: TYLENOL Take 1,000 mg by mouth every 6 (six) hours as needed for mild pain.   blood glucose meter kit and supplies Kit Dispense based on patient and insurance preference. Use up to four times daily as directed. (FOR ICD-9 250.00, 250.01).   CINNAMON PO Take 1 tablet by mouth daily.   ferrous sulfate 325 (65 FE) MG tablet Take 1 tablet (325 mg total) by mouth daily with breakfast.   HumaLOG KwikPen 100 UNIT/ML KwikPen Generic drug: insulin lispro Inject 0-5 Units into the skin 3 (three) times daily as needed (high blood sugar). Per sliding scale   Lantus SoloStar 100 UNIT/ML Solostar Pen Generic drug: insulin glargine Inject 8 Units into the skin 2 (two) times daily.   NovoLIN R FlexPen ReliOn 100 UNIT/ML Sopn Generic drug: Insulin Regular  Human Sugar 121-150--2 units; 151-200 3 units; 201-250 5 units; 251-300--8 units; 301-350--11 units; 351-400--15 units   Pen Needles 3/16" 31G X 5 MM Misc 1 each by Does not apply route 3 (three) times daily.   potassium chloride SA 20 MEQ tablet Commonly known as: KLOR-CON Take 1 tablet (20 mEq total) by mouth 2 (two) times daily. For 3 days       Allergies:  Allergies  Allergen Reactions  . Codeine Rash    Family History: Family History  Problem Relation Age of Onset  . Huntington's disease Father   . Diabetes Sister     Social History:  reports that she has never smoked. She has never used smokeless tobacco. She reports previous alcohol use. She reports previous drug use.  ROS: All other review of systems were reviewed and are negative except what is noted above in HPI  Physical Exam: BP (!) 174/78   Pulse 87   Temp 98.8 F (37.1 C)   Ht 5' (1.524 m)   Wt 151 lb (68.5 kg)   LMP  (LMP Unknown)   BMI 29.49 kg/m   Constitutional:  Alert and oriented, No acute distress. HEENT: [place] AT, moist mucus membranes.  Trachea midline, no masses. Cardiovascular: No clubbing, cyanosis, or edema. Respiratory: Normal respiratory effort, no increased work of breathing. GI: Abdomen  is soft, nontender, nondistended, no abdominal masses GU: No CVA tenderness.  Lymph: No cervical or inguinal lymphadenopathy. Skin: No rashes, bruises or suspicious lesions. Neurologic: Grossly intact, no focal deficits, moving all 4 extremities. Psychiatric: Normal mood and affect.  Laboratory Data: Lab Results  Component Value Date   WBC 11.7 (H) 09/10/2020   HGB 8.3 (L) 09/10/2020   HCT 26.9 (L) 09/10/2020   MCV 93.1 09/10/2020   PLT 684 (H) 09/10/2020    Lab Results  Component Value Date   CREATININE 0.86 09/10/2020    No results found for: PSA  No results found for: TESTOSTERONE  Lab Results  Component Value Date   HGBA1C 12.2 (H) 08/01/2020    Urinalysis    Component Value  Date/Time   COLORURINE AMBER (A) 09/01/2020 0005   APPEARANCEUR Hazy (A) 10/10/2020 1420   LABSPEC 1.006 09/01/2020 0005   PHURINE 6.0 09/01/2020 0005   GLUCOSEU Negative 10/10/2020 1420   HGBUR MODERATE (A) 09/01/2020 0005   BILIRUBINUR Negative 10/10/2020 1420   KETONESUR NEGATIVE 09/01/2020 0005   PROTEINUR Negative 10/10/2020 1420   PROTEINUR 100 (A) 09/01/2020 0005   NITRITE Negative 10/10/2020 1420   NITRITE NEGATIVE 09/01/2020 0005   LEUKOCYTESUR 3+ (A) 10/10/2020 1420   LEUKOCYTESUR MODERATE (A) 09/01/2020 0005    Lab Results  Component Value Date   LABMICR See below: 10/10/2020   WBCUA >30 (A) 10/10/2020   LABEPIT 0-10 10/10/2020   BACTERIA Many (A) 10/10/2020    Pertinent Imaging: CT 10/22/2020: Images reviewed and discussed with the patient  No results found for this or any previous visit.  No results found for this or any previous visit.  No results found for this or any previous visit.  No results found for this or any previous visit.  No results found for this or any previous visit.  No results found for this or any previous visit.  No results found for this or any previous visit.  Results for orders placed during the hospital encounter of 07/31/20  CT RENAL STONE STUDY  Narrative CLINICAL DATA:  60 year old female with pyelonephritis and renal abscess. Follow-up CT.  EXAM: CT ABDOMEN AND PELVIS WITHOUT CONTRAST  TECHNIQUE: Multidetector CT imaging of the abdomen and pelvis was performed following the standard protocol without IV contrast.  COMPARISON:  CT abdomen pelvis dated 08/13/2020.  FINDINGS: Evaluation of this exam is limited in the absence of intravenous contrast.  Lower chest: Partially visualized small bilateral pleural effusions and consolidative changes of the lung bases as seen previously. Several pulmonary nodules measure 7 mm in the right middle lobe.  No intra-abdominal free air or free fluid.  Hepatobiliary: No focal  liver abnormality is seen. No gallstones, gallbladder wall thickening, or biliary dilatation.  Pancreas: Unremarkable. No pancreatic ductal dilatation or surrounding inflammatory changes.  Spleen: Normal in size without focal abnormality.  Adrenals/Urinary Tract: The adrenal glands unremarkable. Left percutaneous nephrostomy with pigtail tip in the left renal pelvis in similar position. There has been interval placement a percutaneous drainage catheter with pigtail tip in the posterior perinephric collection. Slight interval decrease in the size of the perinephric collection compared to prior CT. There is no hydronephrosis or nephrolithiasis on either side. The right kidney is unremarkable. The urinary bladder is decompressed around a Foley catheter.  Stomach/Bowel: There is no bowel obstruction or active inflammation. The appendix is not visualized with certainty. No inflammatory changes identified in the right lower quadrant.  Vascular/Lymphatic: Mild aortoiliac atherosclerotic disease. The IVC is unremarkable.  No portal venous gas. There is no adenopathy.  Reproductive: The uterus and ovaries are grossly unremarkable.  Other: Mild diffuse subcutaneous edema. There has been interval placement of a percutaneous drainage catheter with tip in the fluid collection along the anterior aspect of the left psoas muscle. The collection has decreased in size now measuring approximately 4.2 x 8.0 cm (previously 4.4 x 8.7 cm).  Musculoskeletal: No acute or significant osseous findings.  IMPRESSION: 1. Interval placement of a percutaneous drainage catheter with tip in the posterior perinephric collection. Slight interval decrease in the size of the fluid collection compared to prior CT. 2. Decrease in the size of the collection anterior to the left psoas muscle status post percutaneous drainage catheter placement. Near 3. No hydronephrosis or nephrolithiasis. 4. No bowel  obstruction. 5. Several pulmonary nodules measure 7 mm in the right middle lobe. 6. Aortic Atherosclerosis (ICD10-I70.0).   Electronically Signed By: Anner Crete M.D. On: 08/16/2020 20:27   Assessment & Plan:    1. Perinephric abscess -We discussed the management of left emphysematous pyelonephritis including left simple nephrectomy versus continued medical therapy. After discussing the options the patient elects for open simple nephrectomy. Risks/benefits/alternatives discussed    No follow-ups on file.  Nicolette Bang, MD  [facility] [place]

## 2020-10-24 NOTE — Patient Instructions (Addendum)
Tammie [name]  10/24/2020     @PREFPERIOPPHARMACY @   Your procedure is scheduled on 10/28/2020.  [name].  Call this number if you have problems the morning of surgery:  [phone]   Remember:  Do not eat or drink after midnight.     Take these medicines the morning of surgery with A SIP OF WATER : none   Do not take any of your diabetic medications the morning of surgery.  Please take 1/2 of your Lantus dose the night before surgery.     Do not wear jewelry, make-up or nail polish.  Do not wear lotions, powders, or perfumes, or deodorant.  Do not shave 48 hours prior to surgery.  Men may shave face and neck.  Do not bring valuables to the hospital.  [facility] belongings or valuables.  Contacts, dentures or bridgework may not be worn into surgery.  Leave your suitcase in the car.  After surgery it may be brought to your room.  For patients admitted to the hospital, discharge time will be determined by your treatment team.  Patients discharged the day of surgery will not be allowed to drive home.   Name and phone number of your driver:   family Special instructions:  n/a  Please read over the following fact sheets that you were given. Care and Recovery After Surgery    Open Nephrectomy Open nephrectomy is surgery to remove a kidney through the side or front of the abdomen. You may have surgery to:  Remove the entire kidney and some surrounding structures (radical nephrectomy).  Remove only the damaged or diseased part of the kidney (partial nephrectomy). You may need this surgery if:  Your kidney is severely damaged from disease, infection, or cancer.  You were born with an abnormal kidney.  You are donating a healthy kidney. Tell a health care provider about:  Any allergies you have.  All medicines you are taking, including vitamins, herbs, eye drops, creams, and over-the-counter medicines.  Any  problems you or family members have had with anesthetic medicines.  Any blood disorders you have.  Any surgeries you have had.  Any medical conditions you have.  Whether you are pregnant or may be pregnant. What are the risks? Generally, this is a safe procedure. However, problems may occur, including:  Infection.  Bleeding.  Damage to other body structures near the kidney.  Urine leaking into the abdomen.  Lung infection (pneumonia).  A blood clot that forms in the leg and travels to the lung (pulmonary embolism).  Allergic reactions to medicines. What happens before the procedure? Staying hydrated Follow instructions from your health care provider about hydration, which may include:  Up to 2 hours before the procedure - you may continue to drink clear liquids, such as water, clear fruit juice, black coffee, and plain tea.   Eating and drinking restrictions Follow instructions from your health care provider about eating and drinking, which may include:  8 hours before the procedure - stop eating heavy meals or foods, such as meat, fried foods, or fatty foods.  6 hours before the procedure - stop eating light meals or foods, such as toast or cereal.  6 hours before the procedure - stop drinking milk or drinks that contain milk.  2 hours before the procedure - stop drinking clear liquids. Medicines Ask your health care provider about:  Changing or stopping your regular medicines. This is especially  important if you are taking diabetes medicines or blood thinners.  Taking medicines such as aspirin and ibuprofen. These medicines can thin your blood. Do not take these medicines unless your health care provider tells you to take them.  Taking over-the-counter medicines, vitamins, herbs, and supplements. Surgery safety Ask your health care provider:  How your surgery site will be marked.  What steps will be taken to help prevent infection. These steps may  include: ? Removing hair at the surgery site. ? Washing skin with a germ-killing soap. ? Taking antibiotic medicine. General instructions  Do not use any products that contain nicotine or tobacco for at least 4 weeks before the procedure. These products include cigarettes, e-cigarettes, and chewing tobacco. If you need help quitting, ask your health care provider.  Your health care provider may instruct you to do breathing exercises before the procedure to help prevent pneumonia. Do these exercises as directed.  You may need to have your blood drawn (type and cross) in case you need to receive blood (get a transfusion) during the procedure.  Plan to have a responsible adult take you home from the hospital or clinic.  Plan to have a responsible adult care for you for the time you are told after you leave the hospital or clinic. This is important. What happens during the procedure? Before surgery begins  An IV will be inserted into one of your veins.  You will be given a medicine to make you fall asleep (general anesthetic).  Your health care provider may take steps to help blood flow in your legs. You may have: ? Tight stockings, also called compression stockings, on your legs. ? Compression sleeves wrapped around your legs. A machine called a sequential compression device (SCD) will pump air into the sleeves.  A small, thin tube will be placed in your bladder (urinary catheter) to drain urine.  A tube will be placed through your nose and into your stomach (nasogastric tube) to drain stomach fluids. During surgery  An incision will be made in the front or side of your abdomen.  The muscles, fat, and tissues under your skin will be split to allow access to your kidney.  A bone (usually part of a rib) or more tissue may need to be removed so the surgeon can get to the kidney.  The next steps depend on the type of procedure you are having. ? If you are having a radical  nephrectomy:  Part of the tube that moves urine from your kidney to your bladder (ureter) will be removed.  All of the blood vessels that attach to your kidney will be separated and tied off.  Your kidney will be removed. Nearby lymphatic tissue will also be removed. Lymphatic tissue contains cells that are part of the body's disease-fighting system (immune system). ? If you are having a partial nephrectomy:  The blood vessels attached to your kidney will be clamped.  Part of your kidney will be removed. The kidney will be closed with stitches (sutures), and the clamp on the blood vessels will be removed.  A small tube (drain) may be placed near the incision to drain extra fluid from the surgery area.  The incision may be closed with sutures or another type of closure.  A bandage (dressing) will be placed over the incision. The procedure may vary among health care providers and hospitals.      What happens after the procedure?  Your blood pressure, heart rate, breathing rate, and blood  oxygen level will be monitored until you leave the hospital or clinic.  Your nasogastric tube will be removed.  Your fluid intake and urine output will be monitored.  You will be given pain medicine as needed.  You will be encouraged to: ? Do breathing exercises, such as coughing and breathing deeply. This helps prevent pneumonia. ? Walk as soon as you can. Your health care providers will help you.  You may: ? Have a liquid diet at first. You will most likely return to your usual diet if you do not have any nausea. ? Continue to receive fluids through an IV. Your IV will be removed when you are eating well and you no longer need IV pain medicine or fluids. ? Have a urinary catheter. Your urinary catheter will be removed after 1 to 2 days. ? Have a drain. Your surgical drain will be removed after a few days when only a small amount of fluid is draining from it. ? Have to wear compression  stockings or compression sleeves. This helps to prevent blood clots and reduce swelling in your legs. Summary  Open nephrectomy is surgery to remove a kidney through the side or front of the abdomen.  In a radical nephrectomy, the entire kidney is removed. In a partial nephrectomy, only the damaged or diseased part of the kidney is removed.  Before surgery, follow instructions from your surgeon about eating and drinking.  This procedure is done under general anesthesia. This means that you will be given a medicine to make you fall asleep (general anesthetic).  After the procedure, your fluid intake and urine output will be monitored. This information is not intended to replace advice given to you by your health care provider. Make sure you discuss any questions you have with your health care provider. Document Revised: 11/19/2019 Document Reviewed: 11/19/2019 Elsevier Patient Education  2021 [employer] Inc[employer]    General Anesthesia, Adult General anesthesia is the use of medicines to make a person "go to sleep" (unconscious) for a medical procedure. General anesthesia must be used for certain procedures, and is often recommended for procedures that:  Last a long time.  Require you to be still or in an unusual position.  Are major and can cause blood loss. The medicines used for general anesthesia are called general anesthetics. As well as making you unconscious for a certain amount of time, these medicines:  Prevent pain.  Control your blood pressure.  Relax your muscles. Tell a health care provider about:  Any allergies you have.  All medicines you are taking, including vitamins, herbs, eye drops, creams, and over-the-counter medicines.  Any problems you or family members have had with anesthetic medicines.  Types of anesthetics you have had in the past.  Any blood disorders you have.  Any surgeries you have had.  Any medical conditions you have.  Any recent upper  respiratory, chest, or ear infections.  Any history of: ? Heart or lung conditions, such as heart failure, sleep apnea, asthma, or chronic obstructive pulmonary disease (COPD). ? Financial planner. ? Depression or anxiety.  Any tobacco or drug use, including marijuana or alcohol use.  Whether you are pregnant or may be pregnant. What are the risks? Generally, this is a safe procedure. However, problems may occur, including:  Allergic reaction.  Lung and heart problems.  Inhaling food or liquid from the stomach into the lungs (aspiration).  Nerve injury.  Dental injury.  Air in the bloodstream, which can lead to stroke.  Extreme agitation or confusion (delirium) when you wake up from the anesthetic.  Waking up during your procedure and being unable to move. This is rare. These problems are more likely to develop if you are having a major surgery or if you have an advanced or serious medical condition. You can prevent some of these complications by answering all of your health care provider's questions thoroughly and by following all instructions before your procedure. General anesthesia can cause side effects, including:  Nausea or vomiting.  A sore throat from the breathing tube.  Hoarseness.  Wheezing or coughing.  Shaking chills.  Tiredness.  Body aches.  Anxiety.  Sleepiness or drowsiness.  Confusion or agitation. What happens before the procedure? Staying hydrated Follow instructions from your health care provider about hydration, which may include:  Up to 2 hours before the procedure - you may continue to drink clear liquids, such as water, clear fruit juice, black coffee, and plain tea.   Eating and drinking restrictions Follow instructions from your health care provider about eating and drinking, which may include:  8 hours before the procedure - stop eating heavy meals or foods such as meat, fried foods, or fatty foods.  6 hours before the procedure  - stop eating light meals or foods, such as toast or cereal.  6 hours before the procedure - stop drinking milk or drinks that contain milk.  2 hours before the procedure - stop drinking clear liquids. Medicines Ask your health care provider about:  Changing or stopping your regular medicines. This is especially important if you are taking diabetes medicines or blood thinners.  Taking medicines such as aspirin and ibuprofen. These medicines can thin your blood. Do not take these medicines unless your health care provider tells you to take them.  Taking over-the-counter medicines, vitamins, herbs, and supplements. Do not take these during the week before your procedure unless your health care provider approves them. General instructions  Starting 3-6 weeks before the procedure, do not use any products that contain nicotine or tobacco, such as cigarettes and e-cigarettes. If you need help quitting, ask your health care provider.  If you brush your teeth on the morning of the procedure, make sure to spit out all of the toothpaste.  Tell your health care provider if you become ill or develop a cold, cough, or fever.  If instructed by your health care provider, bring your sleep apnea device with you on the day of your surgery (if applicable).  Ask your health care provider if you will be going home the same day, the following day, or after a longer hospital stay. ? Plan to have a responsible adult take you home from the hospital or clinic. ? Plan to have a responsible adult care for you for the time you are told after you leave the hospital or clinic. This is important. What happens during the procedure?  You will be given anesthetics through both of the following: ? A mask placed over your nose and mouth. ? An IV in one of your veins.  You may receive a medicine to help you relax (sedative).  After you are unconscious, a breathing tube may be inserted down your throat to help you  breathe. This will be removed before you wake up.  An anesthesia specialist will stay with you throughout your procedure. He or she will: ? Keep you comfortable and safe by continuing to give you medicines and adjusting the amount of medicine that you get. ? Monitor  your blood pressure, pulse, and oxygen levels to make sure that the anesthetics do not cause any problems. The procedure may vary among health care providers and hospitals.   What happens after the procedure?  Your blood pressure, temperature, heart rate, breathing rate, and blood oxygen level will be monitored until the medicines you were given have worn off.  You will wake up in a recovery area. You may wake up slowly.  If you feel anxious or agitated, you may be given medicine to help you calm down.  If you will be going home the same day, your health care provider may check to make sure you can walk, drink, and urinate.  Your health care provider will treat any pain or side effects you have before you go home.  Do not drive or operate machinery until your health care provider says that it is safe. Summary  General anesthesia is used to keep you still and prevent pain during a procedure.  It is important to tell your health care provider about your medical history and any surgeries you have had, and previous experience with anesthesia.  Follow your health care provider's instructions about when to stop eating, drinking, or taking certain medicines before your procedure.  Plan to have a responsible adult take you home from the hospital or clinic. This information is not intended to replace advice given to you by your health care provider. Make sure you discuss any questions you have with your health care provider. Document Revised: 04/07/2020 Document Reviewed: 11/07/2019 Elsevier Patient Education  2021 [employer].

## 2020-10-24 NOTE — H&P (View-Only) (Signed)
 10/24/2020 9:12 AM   Tammie [name] 01/27/1961 [id number]  Referring provider: No referring provider defined for this encounter.  Left emphysemtous pyelonephritis  HPI: Ms Reynoso is a 60yo here for followup for left emphysematous pyelonephritis. She is having left flank pain that is constant, sharp, mild to moderate. No fevers. She is on antibiotics per infectious disease   PMH: Past Medical History:  Diagnosis Date  . Acute pyelonephritis   . Class 1 obesity   . Hypertension   . Lung nodules   . Multifocal pneumonia 07/31/2020  . Perirenal abscess   . Pleural effusion     Surgical History: Past Surgical History:  Procedure Laterality Date  . ABDOMINAL HYSTERECTOMY    . IR CATHETER TUBE CHANGE  09/04/2020  . IR CATHETER TUBE CHANGE  09/04/2020  . IR RADIOLOGIST EVAL & MGMT  09/24/2020  . IR RADIOLOGIST EVAL & MGMT  10/08/2020  . IR RADIOLOGIST EVAL & MGMT  10/22/2020    Home Medications:  Allergies as of 10/24/2020      Reactions   Codeine Rash      Medication List       Accurate as of October 24, 2020  9:12 AM. If you have any questions, ask your nurse or doctor.        acetaminophen 500 MG tablet Commonly known as: TYLENOL Take 1,000 mg by mouth every 6 (six) hours as needed for mild pain.   blood glucose meter kit and supplies Kit Dispense based on patient and insurance preference. Use up to four times daily as directed. (FOR ICD-9 250.00, 250.01).   CINNAMON PO Take 1 tablet by mouth daily.   ferrous sulfate 325 (65 FE) MG tablet Take 1 tablet (325 mg total) by mouth daily with breakfast.   HumaLOG KwikPen 100 UNIT/ML KwikPen Generic drug: insulin lispro Inject 0-5 Units into the skin 3 (three) times daily as needed (high blood sugar). Per sliding scale   Lantus SoloStar 100 UNIT/ML Solostar Pen Generic drug: insulin glargine Inject 8 Units into the skin 2 (two) times daily.   NovoLIN R FlexPen ReliOn 100 UNIT/ML Sopn Generic drug: Insulin Regular  Human Sugar 121-150--2 units; 151-200 3 units; 201-250 5 units; 251-300--8 units; 301-350--11 units; 351-400--15 units   Pen Needles 3/16" 31G X 5 MM Misc 1 each by Does not apply route 3 (three) times daily.   potassium chloride SA 20 MEQ tablet Commonly known as: KLOR-CON Take 1 tablet (20 mEq total) by mouth 2 (two) times daily. For 3 days       Allergies:  Allergies  Allergen Reactions  . Codeine Rash    Family History: Family History  Problem Relation Age of Onset  . Huntington's disease Father   . Diabetes Sister     Social History:  reports that she has never smoked. She has never used smokeless tobacco. She reports previous alcohol use. She reports previous drug use.  ROS: All other review of systems were reviewed and are negative except what is noted above in HPI  Physical Exam: BP (!) 174/78   Pulse 87   Temp 98.8 F (37.1 C)   Ht 5' (1.524 m)   Wt 151 lb (68.5 kg)   LMP  (LMP Unknown)   BMI 29.49 kg/m   Constitutional:  Alert and oriented, No acute distress. HEENT: [place] AT, moist mucus membranes.  Trachea midline, no masses. Cardiovascular: No clubbing, cyanosis, or edema. Respiratory: Normal respiratory effort, no increased work of breathing. GI: Abdomen   is soft, nontender, nondistended, no abdominal masses GU: No CVA tenderness.  Lymph: No cervical or inguinal lymphadenopathy. Skin: No rashes, bruises or suspicious lesions. Neurologic: Grossly intact, no focal deficits, moving all 4 extremities. Psychiatric: Normal mood and affect.  Laboratory Data: Lab Results  Component Value Date   WBC 11.7 (H) 09/10/2020   HGB 8.3 (L) 09/10/2020   HCT 26.9 (L) 09/10/2020   MCV 93.1 09/10/2020   PLT 684 (H) 09/10/2020    Lab Results  Component Value Date   CREATININE 0.86 09/10/2020    No results found for: PSA  No results found for: TESTOSTERONE  Lab Results  Component Value Date   HGBA1C 12.2 (H) 08/01/2020    Urinalysis    Component Value  Date/Time   COLORURINE AMBER (A) 09/01/2020 0005   APPEARANCEUR Hazy (A) 10/10/2020 1420   LABSPEC 1.006 09/01/2020 0005   PHURINE 6.0 09/01/2020 0005   GLUCOSEU Negative 10/10/2020 1420   HGBUR MODERATE (A) 09/01/2020 0005   BILIRUBINUR Negative 10/10/2020 1420   KETONESUR NEGATIVE 09/01/2020 0005   PROTEINUR Negative 10/10/2020 1420   PROTEINUR 100 (A) 09/01/2020 0005   NITRITE Negative 10/10/2020 1420   NITRITE NEGATIVE 09/01/2020 0005   LEUKOCYTESUR 3+ (A) 10/10/2020 1420   LEUKOCYTESUR MODERATE (A) 09/01/2020 0005    Lab Results  Component Value Date   LABMICR See below: 10/10/2020   WBCUA >30 (A) 10/10/2020   LABEPIT 0-10 10/10/2020   BACTERIA Many (A) 10/10/2020    Pertinent Imaging: CT 10/22/2020: Images reviewed and discussed with the patient  No results found for this or any previous visit.  No results found for this or any previous visit.  No results found for this or any previous visit.  No results found for this or any previous visit.  No results found for this or any previous visit.  No results found for this or any previous visit.  No results found for this or any previous visit.  Results for orders placed during the hospital encounter of 07/31/20  CT RENAL STONE STUDY  Narrative CLINICAL DATA:  60-year-old female with pyelonephritis and renal abscess. Follow-up CT.  EXAM: CT ABDOMEN AND PELVIS WITHOUT CONTRAST  TECHNIQUE: Multidetector CT imaging of the abdomen and pelvis was performed following the standard protocol without IV contrast.  COMPARISON:  CT abdomen pelvis dated 08/13/2020.  FINDINGS: Evaluation of this exam is limited in the absence of intravenous contrast.  Lower chest: Partially visualized small bilateral pleural effusions and consolidative changes of the lung bases as seen previously. Several pulmonary nodules measure 7 mm in the right middle lobe.  No intra-abdominal free air or free fluid.  Hepatobiliary: No focal  liver abnormality is seen. No gallstones, gallbladder wall thickening, or biliary dilatation.  Pancreas: Unremarkable. No pancreatic ductal dilatation or surrounding inflammatory changes.  Spleen: Normal in size without focal abnormality.  Adrenals/Urinary Tract: The adrenal glands unremarkable. Left percutaneous nephrostomy with pigtail tip in the left renal pelvis in similar position. There has been interval placement a percutaneous drainage catheter with pigtail tip in the posterior perinephric collection. Slight interval decrease in the size of the perinephric collection compared to prior CT. There is no hydronephrosis or nephrolithiasis on either side. The right kidney is unremarkable. The urinary bladder is decompressed around a Foley catheter.  Stomach/Bowel: There is no bowel obstruction or active inflammation. The appendix is not visualized with certainty. No inflammatory changes identified in the right lower quadrant.  Vascular/Lymphatic: Mild aortoiliac atherosclerotic disease. The IVC is unremarkable.   No portal venous gas. There is no adenopathy.  Reproductive: The uterus and ovaries are grossly unremarkable.  Other: Mild diffuse subcutaneous edema. There has been interval placement of a percutaneous drainage catheter with tip in the fluid collection along the anterior aspect of the left psoas muscle. The collection has decreased in size now measuring approximately 4.2 x 8.0 cm (previously 4.4 x 8.7 cm).  Musculoskeletal: No acute or significant osseous findings.  IMPRESSION: 1. Interval placement of a percutaneous drainage catheter with tip in the posterior perinephric collection. Slight interval decrease in the size of the fluid collection compared to prior CT. 2. Decrease in the size of the collection anterior to the left psoas muscle status post percutaneous drainage catheter placement. Near 3. No hydronephrosis or nephrolithiasis. 4. No bowel  obstruction. 5. Several pulmonary nodules measure 7 mm in the right middle lobe. 6. Aortic Atherosclerosis (ICD10-I70.0).   Electronically Signed By: Arash  Radparvar M.D. On: 08/16/2020 20:27   Assessment & Plan:    1. Perinephric abscess -We discussed the management of left emphysematous pyelonephritis including left simple nephrectomy versus continued medical therapy. After discussing the options the patient elects for open simple nephrectomy. Risks/benefits/alternatives discussed    No follow-ups on file.  Lagina Reader, MD  [facility] [place]  

## 2020-10-24 NOTE — Progress Notes (Signed)
Urological Symptom Review  Patient is experiencing the following symptoms: Frequent urination Get up at night to urinate Injury to kidneys/bladder   Review of Systems  Gastrointestinal (upper)  : Nausea  Gastrointestinal (lower) : Negative for lower GI symptoms  Constitutional : Fever Night Sweats  Skin: Negative for skin symptoms  Eyes: Negative for eye symptoms  Ear/Nose/Throat : Negative for Ear/Nose/Throat symptoms  Hematologic/Lymphatic: Negative for Hematologic/Lymphatic symptoms  Cardiovascular : Negative for cardiovascular symptoms  Respiratory : Negative for respiratory symptoms  Endocrine: Negative for endocrine symptoms  Musculoskeletal: Negative for musculoskeletal symptoms  Neurological: Negative for neurological symptoms  Psychologic: Negative for psychiatric symptoms

## 2020-10-26 DIAGNOSIS — R6520 Severe sepsis without septic shock: Secondary | ICD-10-CM | POA: Diagnosis not present

## 2020-10-26 DIAGNOSIS — N12 Tubulo-interstitial nephritis, not specified as acute or chronic: Secondary | ICD-10-CM | POA: Diagnosis not present

## 2020-10-26 DIAGNOSIS — N151 Renal and perinephric abscess: Secondary | ICD-10-CM | POA: Diagnosis not present

## 2020-10-26 DIAGNOSIS — J90 Pleural effusion, not elsewhere classified: Secondary | ICD-10-CM | POA: Diagnosis not present

## 2020-10-27 ENCOUNTER — Encounter: Payer: Self-pay | Admitting: Internal Medicine

## 2020-10-27 ENCOUNTER — Ambulatory Visit (INDEPENDENT_AMBULATORY_CARE_PROVIDER_SITE_OTHER): Payer: BC Managed Care – PPO | Admitting: Internal Medicine

## 2020-10-27 ENCOUNTER — Other Ambulatory Visit: Payer: Self-pay

## 2020-10-27 VITALS — BP 151/84 | HR 67 | Temp 98.0°F | Resp 16 | Ht 60.0 in | Wt 156.0 lb

## 2020-10-27 DIAGNOSIS — N12 Tubulo-interstitial nephritis, not specified as acute or chronic: Secondary | ICD-10-CM

## 2020-10-27 DIAGNOSIS — K651 Peritoneal abscess: Secondary | ICD-10-CM | POA: Diagnosis not present

## 2020-10-27 MED ORDER — GENTAMICIN SULFATE 40 MG/ML IJ SOLN
5.0000 mg/kg | INTRAVENOUS | Status: AC
  Administered 2020-10-28: 370 mg via INTRAVENOUS
  Filled 2020-10-27 (×2): qty 9.25

## 2020-10-27 NOTE — Patient Instructions (Signed)
We are awaiting resection of the kidney  Continue iv antibiotics for now  Make sure you are seen by the inpatient ID team after surgery so they can review case and see if you still need antibiotics and for how long   They will determine when you need folllow up as well, but plan for around 2-3 weeks after release from hospital with our id clinic

## 2020-10-27 NOTE — Progress Notes (Signed)
[facility]  Reason for Consult: Hospital follow up perinephric abscess  Referring Provider: Hospital follow up   HPI:    Tammie [name] is a 60 y.o. female with PMHx as below who presents to the clinic for follow up of perinephric abscess.   10/27/20 id f/u She was continued on cefazolin last visit Dr Alyson Ingles with urology evaluated patient 3 days ago and all decided on nephrectomy for her emphysematous pyelo, as she hasn't responded properly to several months of antibiotics She has no n/v/diarrhea/rash She continues to have purulent drainage off the percutaneous drains No f/c  I still have no labs from her hh. I checked [facility] website and there is no routine labs either     09/29/20 id f/u Patient had repeat ct of the abd/pelv on 2/02 ed visit after referred from clinic; no adjustment of drains done. She then had repeat ct by IR on 2/16 -- result below. Some improvement in perinephric abscess size but increasing pararenal abscess. Again one of the drain is not draining well, but the one into the perinephric abscess is not Her last cefazolin dose was on 2/18 No f/c/n/v/diarrhea/rash Feels well today  Of the 2 abscess drains, 1 is not draining anything, but the other is still draining average 50 cc pus a day. She has repeat ct scan abd on 3/16 that still visualized significant gas/inflammatoin of the left kidney. The fluid cavity where the drains were draining had collapsed but there is still drainage.  The nephrostomy tube is draining minimal clear urine. She is urinating well naturally     Background [facility] clinic note on 09/10/20 ---------- She is a 60 yo woman with hx of DM, HTN, emphysematous pyelo, intra-abdominal abscess.  She has a complicated history with hospitalization 07/31/20-08/18/20 where she was treated for sepsis secondary to emphysematous pyelo, pelvic abscess, and perinephric abscess.  Cultures grew pan-sensitive E  coli.  During that hospitalization, she required placement of a left perc nephrostomy tube as well as drains to pelvic abscess and perirenal abscess. PICC was placed and she was discharged on cefazolin through 09/25/20.  During that admit also found to have left renal vein thrombus, bilateral pleural effusion and pulm abscess/nodules but was never symptomatic from respiratory standpoint.  She was then readmitted 1/23-1/28/22 after presenting with fevers, nausea, and vomiting.  Also with elevated WBC and decreased drain output.  Repeat CT done 09/01/20 with left perinephric abscess slightly decreased in size compared to prior.  Left pararenal abscess appeared relatively unchanged.  IR upsized her drains on 09/04/20.  Pleural effusions had resolved.  Subpleural nodule within the right lung base and atelectasis within the left lung base were unchanged.    She was treated with cefepime during admission and narrowed back to cefazolin at discharge.  She has repeat CT scan scheduled for tomorrow.   Urology planning for possible nephrectomy in the future as an outpatient.   OPAT labs from 09/08/20 have a WBC 8.7, Hgb 9.8, platelets 266.  BUN 15, Creatinine 0.8.  Today, she reports having increased pain in her back since drains were upsized on 09/04/20.  She also reports minimal drainage into catheter drain bags but is having copious drainage around the tubing.  She also endorses decreased PCN tube drainage today.  She has been having fevers and sweats.    Patient's Medications  New Prescriptions   No medications on file  Previous Medications   ACETAMINOPHEN (TYLENOL) 500  MG TABLET    Take 1,000 mg by mouth every 6 (six) hours as needed for mild pain.   BLOOD GLUCOSE METER KIT AND SUPPLIES KIT    Dispense based on patient and insurance preference. Use up to four times daily as directed. (FOR ICD-9 250.00, 250.01).   CINNAMON PO    Take 1 tablet by mouth daily.   FERROUS SULFATE 325 (65 FE) MG TABLET    Take 1  tablet (325 mg total) by mouth daily with breakfast.   HUMALOG KWIKPEN 100 UNIT/ML KWIKPEN    Inject 0-5 Units into the skin 3 (three) times daily as needed (high blood sugar). Per sliding scale   INSULIN GLARGINE (LANTUS SOLOSTAR) 100 UNIT/ML SOLOSTAR PEN    Inject 8 Units into the skin 2 (two) times daily.   INSULIN PEN NEEDLE (PEN NEEDLES 3/16") 31G X 5 MM MISC    1 each by Does not apply route 3 (three) times daily.   INSULIN REGULAR HUMAN (NOVOLIN R FLEXPEN RELION) 100 UNIT/ML SOPN    Sugar 121-150--2 units; 151-200 3 units; 201-250 5 units; 251-300--8 units; 301-350--11 units; 351-400--15 units   POTASSIUM CHLORIDE SA (KLOR-CON) 20 MEQ TABLET    Take 1 tablet (20 mEq total) by mouth 2 (two) times daily. For 3 days  Modified Medications   No medications on file  Discontinued Medications   No medications on file      Past Medical History:  Diagnosis Date  . Acute pyelonephritis   . Arthritis   . Class 1 obesity   . Diabetes mellitus without complication (HCC)   . History of drainage of abscess   . Hypertension   . Lung nodules   . Multifocal pneumonia 07/31/2020  . Perirenal abscess   . Pleural effusion     Social History   Tobacco Use  . Smoking status: Never Smoker  . Smokeless tobacco: Never Used  Vaping Use  . Vaping Use: Never used  Substance Use Topics  . Alcohol use: Not Currently  . Drug use: Not Currently    Family History  Problem Relation Age of Onset  . Huntington's disease Father   . Diabetes Sister     Allergies  Allergen Reactions  . Codeine Rash    Review of Systems  Gastrointestinal: Negative.   Skin: Negative for rash.      OBJECTIVE:    There were no vitals filed for this visit.   There is no height or weight on file to calculate BMI.  Physical Exam HENT:     Head: Normocephalic and atraumatic.  Pulmonary:     Effort: Pulmonary effort is normal. No respiratory distress.  Genitourinary:    Comments: Left sided PCN tube with  clear urine in bag. No drainage from perc drain #1 (the perinephric abscess drain) Purulent drainage from perc drain #2 (the pararenal inferior abscess)  Drain site without erythema purulence Skin:    General: Skin is warm and dry.     Comments: Rue picc site no erythema/tenderness/flutuance  Neurological:     General: No focal deficit present.     Mental Status: She is alert and oriented to person, place, and time.  Psychiatric:        Mood and Affect: Mood normal.        Behavior: Behavior normal.      Labs and Microbiology:  CBC Latest Ref Rng & Units 10/24/2020 09/10/2020 09/05/2020  WBC 4.0 - 10.5 K/uL 9.8 11.7(H) 8.3  Hemoglobin 12.0 -  15.0 g/dL 9.6(L) 8.3(L) 7.5(L)  Hematocrit 36.0 - 46.0 % 32.0(L) 26.9(L) 24.8(L)  Platelets 150 - 400 K/uL 630(H) 684(H) 446(H)   CMP Latest Ref Rng & Units 10/24/2020 09/10/2020 09/05/2020  Glucose 70 - 99 mg/dL 104(H) 120(H) 97  BUN 6 - 20 mg/dL $Remove'9 14 6  'wupuSfQ$ Creatinine 0.44 - 1.00 mg/dL 0.60 0.86 0.54  Sodium 135 - 145 mmol/L 137 137 137  Potassium 3.5 - 5.1 mmol/L 3.6 2.9(L) 3.2(L)  Chloride 98 - 111 mmol/L 100 102 104  CO2 22 - 32 mmol/L $RemoveB'25 24 25  'GxZAMSzu$ Calcium 8.9 - 10.3 mg/dL 9.7 8.9 8.7(L)  Total Protein 6.5 - 8.1 g/dL - 7.3 -  Total Bilirubin 0.3 - 1.2 mg/dL - 0.5 -  Alkaline Phos 38 - 126 U/L - 190(H) -  AST 15 - 41 U/L - 12(L) -  ALT 0 - 44 U/L - 8 -     Recent Results (from the past 240 hour(s))  Microscopic Examination     Status: Abnormal   Collection Time: 10/24/20  8:55 AM   Urine  Result Value Ref Range Status   WBC, UA >30 (A) 0 - 5 /hpf Final   RBC 3-10 (A) 0 - 2 /hpf Final   Epithelial Cells (non renal) >10 (A) 0 - 10 /hpf Final   Renal Epithel, UA None seen None seen /hpf Final   Mucus, UA Present Not Estab. Final   Bacteria, UA Moderate (A) None seen/Few Final  SARS CORONAVIRUS 2 (TAT 6-24 HRS) Nasopharyngeal Nasopharyngeal Swab     Status: None   Collection Time: 10/24/20 10:40 AM   Specimen: Nasopharyngeal Swab  Result  Value Ref Range Status   SARS Coronavirus 2 NEGATIVE NEGATIVE Final    Comment: (NOTE) SARS-CoV-2 target nucleic acids are NOT DETECTED.  The SARS-CoV-2 RNA is generally detectable in upper and lower respiratory specimens during the acute phase of infection. Negative results do not preclude SARS-CoV-2 infection, do not rule out co-infections with other pathogens, and should not be used as the sole basis for treatment or other patient management decisions. Negative results must be combined with clinical observations, patient history, and epidemiological information. The expected result is Negative.  Fact Sheet for Patients: SugarRoll.be  Fact Sheet for Healthcare Providers: https://www.woods-mathews.com/  This test is not yet approved or cleared by the [place] FDA and  has been authorized for detection and/or diagnosis of SARS-CoV-2 by FDA under an Emergency Use Authorization (EUA). This EUA will remain  in effect (meaning this test can be used) for the duration of the COVID-19 declaration under Se ction 564(b)(1) of the Act, 21 U.S.C. section 360bbb-3(b)(1), unless the authorization is terminated or revoked sooner.  Performed at [facility], [place] [address] [place], [place] [address]     Imaging: CT SCAN 09/01/20 IMPRESSION: Left perinephric abscess has slightly decreased in size when compared to prior examination. Percutaneous drainage catheter is seen within the collection, though has been partially withdrawn when compared to prior exam.  Left posterior pararenal U shaped abscess appears unchanged when compared to prior examination. Percutaneous drainage catheter appears appropriately position within the dependent component of the collection, unchanged from prior examination.  Left percutaneous nephrostomy in appropriate position. No hydronephrosis.  Resolved bilateral pleural effusions. Stable right  subpleural pulmonary nodule, likely inflammatory in nature.   2/16 ct abd pelv with contrast 1. Left nephrostomy catheter is in good position with mild pelvicaliectasis. 2. Minimal interval decrease in size of 7.7 cm posterior perinephric  gas and fluid collection with pigtail drain catheter well position in the central aspect. 3. Slight enlargement of multiloculated left retroperitoneal iliopsoas collection, despite drain catheter manipulation.   3/16 ct abd/pelv with contrast 1. Persistent abnormal appearance of the left kidney with diffusely delayed nephrogram and extensive perinephric edema. Findings remain consistent with diffuse renal edema versus pyelonephritis. 2. Slight interval improvement in the size of the subcapsular perinephric fluid and gas collection now measuring approximately 14 mL compared to 19 mL previously. 3. Unchanged appearance of collapsed/resolved abscess cavity in the left lower quadrant retroperitoneal space with percutaneous drainage catheter in place. Despite no visible residual fluid in this region, the drain continues to put out approximately 50 mL daily. It is possible that the more superior perinephric fluid collection is draining dependently with gravity toward this more inferior drainage catheter. 4. Additional ancillary findings as above without significant interval change.   ASSESSMENT & PLAN:    1. Perirenal abscess s/p IR drain placement x 2 2. E coli bacteremia 3. Pyelonephritis s/p PCN drain  Lines: 1/01-c RUE picc  Abx: 12/27-1/23; 1/28-2/18; 2/21-c cefazolin 08/31/20-01/28 cefepime  12/23-27 ceftriaxone   60 yo female obese, dm2 admitted 12/23 for severe sepsis with aki, HHS, leukemoid reaction, found to have acute left emphysematous pyelonephritis with left renal vein thrombus, perirenal abscess, bilateral pleural effusion/pulm abscess-nodules, complicated by ecoli bacteremia. Patient discharged after drain placement into  the pararenal abscess, however readmitted 1/23 for sepsis with increased size perinephric abscess. She had subsequent IR visits who deemed the drains are in correct position, but unclear why one drain (into the perinephric abscess) is not draining well.  She also has a nephrostomy tube.  She is now at 8 weeks of antibiotics with minimal improvement in the abscess size. One of the drains is not draining well. Repeat ct last on 2/16 is concerning with increased pararenal abscess that appears multiloculated. The perinephric abscess has some improvement in size.    We reviewed the ct scan from December and 09/24/20... they look basically the same. There are 2 large abscesses -- one just above the left pelvis and one right below the left kidney.   Patient will need to be back on antibiotics given the abscess size. She'll also need to have discussion with urology/surgery. I worry she might need surgery for abscess clean out. The other more conservative route is another drain into the abscess due to loculation and see if more can be drained  She does mention that after 2/16 manual drainage of the abscess (drain #2 -- pararenal abscess) has been at least 1000 plus cc. So maybe repeat ct will see much improvement   10/27/20 assessment Resumed cefazolin 2/21 Pending nephrectomy with Dr Alyson Ingles tomorrow Would have ID team see her inpatient and make decision on antibiotics based on imaging/operative finding   -continue cefazolin 2 gram iv q8 hours; duration depending on finding during tomorrow admission/operative finding -no opat labs here; recent cbc/bmp appears ok on 3/18 -I will let dr Alyson Ingles aware via inbox to discuss case with inpatient ID team after surgery is done -f/u 4-6 weeks with me      [facility] 10/27/2020, 11:13 AM   I spent greater than 30 minutes dedicated to the care of this patient on the date of this encounter  to include pre-visit review of records, face-to-face time with the patient discussing E coli infection,  condition/treatment, and post-visit ordering of testing.

## 2020-10-28 ENCOUNTER — Inpatient Hospital Stay (HOSPITAL_COMMUNITY)
Admission: RE | Admit: 2020-10-28 | Discharge: 2020-11-01 | DRG: 661 | Disposition: A | Discharge status: Home / Self Care | Payer: BC Managed Care – PPO | Attending: Urology | Admitting: Urology

## 2020-10-28 ENCOUNTER — Inpatient Hospital Stay (HOSPITAL_COMMUNITY): Payer: BC Managed Care – PPO | Admitting: Certified Registered Nurse Anesthetist

## 2020-10-28 ENCOUNTER — Encounter (HOSPITAL_COMMUNITY): Payer: Self-pay | Admitting: Urology

## 2020-10-28 ENCOUNTER — Encounter (HOSPITAL_COMMUNITY)
Admission: RE | Disposition: A | Discharge status: Home / Self Care | Payer: Self-pay | Source: Home / Self Care | Attending: Urology

## 2020-10-28 DIAGNOSIS — N111 Chronic obstructive pyelonephritis: Secondary | ICD-10-CM | POA: Diagnosis not present

## 2020-10-28 DIAGNOSIS — N151 Renal and perinephric abscess: Principal | ICD-10-CM | POA: Diagnosis present

## 2020-10-28 DIAGNOSIS — N12 Tubulo-interstitial nephritis, not specified as acute or chronic: Secondary | ICD-10-CM | POA: Diagnosis present

## 2020-10-28 DIAGNOSIS — Z20822 Contact with and (suspected) exposure to covid-19: Secondary | ICD-10-CM | POA: Diagnosis not present

## 2020-10-28 DIAGNOSIS — E669 Obesity, unspecified: Secondary | ICD-10-CM | POA: Diagnosis not present

## 2020-10-28 DIAGNOSIS — K660 Peritoneal adhesions (postprocedural) (postinfection): Secondary | ICD-10-CM | POA: Diagnosis not present

## 2020-10-28 DIAGNOSIS — R918 Other nonspecific abnormal finding of lung field: Secondary | ICD-10-CM | POA: Diagnosis present

## 2020-10-28 DIAGNOSIS — Z6829 Body mass index (BMI) 29.0-29.9, adult: Secondary | ICD-10-CM | POA: Diagnosis not present

## 2020-10-28 DIAGNOSIS — Z79899 Other long term (current) drug therapy: Secondary | ICD-10-CM

## 2020-10-28 DIAGNOSIS — E119 Type 2 diabetes mellitus without complications: Secondary | ICD-10-CM | POA: Diagnosis present

## 2020-10-28 DIAGNOSIS — Z833 Family history of diabetes mellitus: Secondary | ICD-10-CM

## 2020-10-28 DIAGNOSIS — Z885 Allergy status to narcotic agent status: Secondary | ICD-10-CM

## 2020-10-28 DIAGNOSIS — I10 Essential (primary) hypertension: Secondary | ICD-10-CM | POA: Diagnosis present

## 2020-10-28 DIAGNOSIS — Z794 Long term (current) use of insulin: Secondary | ICD-10-CM

## 2020-10-28 DIAGNOSIS — L8990 Pressure ulcer of unspecified site, unspecified stage: Secondary | ICD-10-CM | POA: Insufficient documentation

## 2020-10-28 HISTORY — PX: NEPHRECTOMY: SHX65

## 2020-10-28 LAB — BASIC METABOLIC PANEL
Anion gap: 9 (ref 5–15)
BUN: 12 mg/dL (ref 6–20)
CO2: 25 mmol/L (ref 22–32)
Calcium: 8.5 mg/dL — ABNORMAL LOW (ref 8.9–10.3)
Chloride: 104 mmol/L (ref 98–111)
Creatinine, Ser: 0.71 mg/dL (ref 0.44–1.00)
GFR, Estimated: >60 mL/min (ref >60)
Glucose, Bld: 217 mg/dL — ABNORMAL HIGH (ref 70–99)
Potassium: 3.8 mmol/L (ref 3.5–5.1)
Sodium: 138 mmol/L (ref 135–145)

## 2020-10-28 LAB — CBC
HCT: 26 % — ABNORMAL LOW (ref 36.0–46.0)
Hemoglobin: 7.7 g/dL — ABNORMAL LOW (ref 12.0–15.0)
MCH: 25.1 pg — ABNORMAL LOW (ref 26.0–34.0)
MCHC: 29.6 g/dL — ABNORMAL LOW (ref 30.0–36.0)
MCV: 84.7 fL (ref 80.0–100.0)
Platelets: 426 10*3/uL — ABNORMAL HIGH (ref 150–400)
RBC: 3.07 MIL/uL — ABNORMAL LOW (ref 3.87–5.11)
RDW: 14.8 % (ref 11.5–15.5)
WBC: 12.7 10*3/uL — ABNORMAL HIGH (ref 4.0–10.5)
nRBC: 0 % (ref 0.0–0.2)

## 2020-10-28 LAB — TYPE AND SCREEN
ABO/RH(D): A POS
Antibody Screen: NEGATIVE

## 2020-10-28 LAB — GLUCOSE, CAPILLARY
Glucose-Capillary: 130 mg/dL — ABNORMAL HIGH (ref 70–99)
Glucose-Capillary: 208 mg/dL — ABNORMAL HIGH (ref 70–99)
Glucose-Capillary: 197 mg/dL — ABNORMAL HIGH (ref 70–99)
Glucose-Capillary: 163 mg/dL — ABNORMAL HIGH (ref 70–99)

## 2020-10-28 LAB — ABO/RH: ABO/RH(D): A POS

## 2020-10-28 SURGERY — NEPHRECTOMY
Anesthesia: General | Site: Flank | Laterality: Left

## 2020-10-28 MED ORDER — ALBUMIN HUMAN 25 % IV SOLN
12.5000 g | Freq: Once | INTRAVENOUS | Status: AC
  Administered 2020-10-28: 12.5 g via INTRAVENOUS
  Filled 2020-10-28: qty 50

## 2020-10-28 MED ORDER — HYDROMORPHONE HCL 1 MG/ML IJ SOLN
INTRAMUSCULAR | Status: DC | PRN
  Administered 2020-10-28 (×2): .5 mg via INTRAVENOUS

## 2020-10-28 MED ORDER — DIPHENHYDRAMINE HCL 50 MG/ML IJ SOLN
12.5000 mg | Freq: Four times a day (QID) | INTRAMUSCULAR | Status: DC | PRN
Start: 2020-10-28 — End: 2020-11-01

## 2020-10-28 MED ORDER — LACTATED RINGERS IV SOLN: INTRAVENOUS | Status: DC | PRN

## 2020-10-28 MED ORDER — OXYCODONE-ACETAMINOPHEN 5-325 MG PO TABS
1.0000 | ORAL_TABLET | ORAL | Status: DC | PRN
  Administered 2020-10-30 – 2020-10-31 (×5): 2 via ORAL
  Filled 2020-10-28 (×5): qty 2

## 2020-10-28 MED ORDER — PHENYLEPHRINE HCL (PRESSORS) 10 MG/ML IV SOLN
INTRAVENOUS | Status: DC | PRN
  Administered 2020-10-28: 120 ug via INTRAVENOUS
  Administered 2020-10-28 (×3): 80 ug via INTRAVENOUS
  Administered 2020-10-28: 40 ug via INTRAVENOUS

## 2020-10-28 MED ORDER — FENTANYL CITRATE (PF) 250 MCG/5ML IJ SOLN
INTRAMUSCULAR | Status: AC
  Filled 2020-10-28: qty 5

## 2020-10-28 MED ORDER — LIDOCAINE HCL (PF) 2 % IJ SOLN
INTRAMUSCULAR | Status: AC
  Filled 2020-10-28: qty 5

## 2020-10-28 MED ORDER — ONDANSETRON HCL 4 MG/2ML IJ SOLN
INTRAMUSCULAR | Status: AC
  Filled 2020-10-28: qty 2

## 2020-10-28 MED ORDER — LACTATED RINGERS IV SOLN: INTRAVENOUS | Status: DC

## 2020-10-28 MED ORDER — PHENYLEPHRINE 40 MCG/ML (10ML) SYRINGE FOR IV PUSH (FOR BLOOD PRESSURE SUPPORT)
PREFILLED_SYRINGE | INTRAVENOUS | Status: AC
  Filled 2020-10-28: qty 10

## 2020-10-28 MED ORDER — DIPHENHYDRAMINE HCL 12.5 MG/5ML PO ELIX: 12.5000 mg | ORAL_SOLUTION | Freq: Four times a day (QID) | ORAL | Status: DC | PRN

## 2020-10-28 MED ORDER — MIDAZOLAM HCL 2 MG/2ML IJ SOLN
INTRAMUSCULAR | Status: AC
  Filled 2020-10-28: qty 2

## 2020-10-28 MED ORDER — DEXAMETHASONE SODIUM PHOSPHATE 10 MG/ML IJ SOLN
INTRAMUSCULAR | Status: DC | PRN
  Administered 2020-10-28: 5 mg via INTRAVENOUS

## 2020-10-28 MED ORDER — DEXAMETHASONE SODIUM PHOSPHATE 10 MG/ML IJ SOLN
INTRAMUSCULAR | Status: AC
  Filled 2020-10-28: qty 1

## 2020-10-28 MED ORDER — ROCURONIUM BROMIDE 10 MG/ML (PF) SYRINGE
PREFILLED_SYRINGE | INTRAVENOUS | Status: AC
  Filled 2020-10-28: qty 10

## 2020-10-28 MED ORDER — PROPOFOL 10 MG/ML IV BOLUS
INTRAVENOUS | Status: AC
  Filled 2020-10-28: qty 20

## 2020-10-28 MED ORDER — FENTANYL CITRATE (PF) 100 MCG/2ML IJ SOLN
INTRAMUSCULAR | Status: DC | PRN
  Administered 2020-10-28 (×2): 50 ug via INTRAVENOUS
  Administered 2020-10-28 (×2): 25 ug via INTRAVENOUS
  Administered 2020-10-28: 100 ug via INTRAVENOUS

## 2020-10-28 MED ORDER — 0.9 % SODIUM CHLORIDE (POUR BTL) OPTIME
TOPICAL | Status: DC | PRN
  Administered 2020-10-28 (×3): 1000 mL

## 2020-10-28 MED ORDER — CEFAZOLIN SODIUM-DEXTROSE 2-4 GM/100ML-% IV SOLN
2.0000 g | Freq: Three times a day (TID) | INTRAVENOUS | Status: DC
  Administered 2020-10-28 – 2020-11-01 (×12): 2 g via INTRAVENOUS
  Filled 2020-10-28 (×12): qty 100

## 2020-10-28 MED ORDER — PHENYLEPHRINE HCL (PRESSORS) 10 MG/ML IV SOLN
INTRAVENOUS | Status: AC
  Filled 2020-10-28: qty 2

## 2020-10-28 MED ORDER — SUGAMMADEX SODIUM 500 MG/5ML IV SOLN
INTRAVENOUS | Status: DC | PRN
  Administered 2020-10-28: 200 mg via INTRAVENOUS

## 2020-10-28 MED ORDER — HEMOSTATIC AGENTS (NO CHARGE) OPTIME
TOPICAL | Status: DC | PRN
  Administered 2020-10-28 (×2): 1 via TOPICAL

## 2020-10-28 MED ORDER — BUPIVACAINE LIPOSOME 1.3 % IJ SUSP
INTRAMUSCULAR | Status: DC | PRN
  Administered 2020-10-28: 20 mL

## 2020-10-28 MED ORDER — ZOLPIDEM TARTRATE 5 MG PO TABS: 5.0000 mg | ORAL_TABLET | Freq: Every evening | ORAL | Status: DC | PRN

## 2020-10-28 MED ORDER — HYDROMORPHONE HCL 1 MG/ML IJ SOLN
INTRAMUSCULAR | Status: AC
  Filled 2020-10-28: qty 1

## 2020-10-28 MED ORDER — ONDANSETRON HCL 4 MG/2ML IJ SOLN: 4.0000 mg | Freq: Once | INTRAMUSCULAR | Status: DC | PRN

## 2020-10-28 MED ORDER — SODIUM CHLORIDE 0.9 % IV SOLN: INTRAVENOUS | Status: DC

## 2020-10-28 MED ORDER — SODIUM CHLORIDE 0.9% IV SOLUTION: Freq: Once | INTRAVENOUS | Status: DC

## 2020-10-28 MED ORDER — MIDAZOLAM HCL 2 MG/2ML IJ SOLN
INTRAMUSCULAR | Status: DC | PRN
  Administered 2020-10-28: 2 mg via INTRAVENOUS

## 2020-10-28 MED ORDER — ACETAMINOPHEN 325 MG PO TABS
650.0000 mg | ORAL_TABLET | ORAL | Status: DC | PRN
  Administered 2020-10-28: 650 mg via ORAL
  Filled 2020-10-28: qty 2

## 2020-10-28 MED ORDER — ONDANSETRON HCL 4 MG/2ML IJ SOLN
4.0000 mg | INTRAMUSCULAR | Status: DC | PRN
  Administered 2020-10-29 – 2020-10-31 (×8): 4 mg via INTRAVENOUS
  Filled 2020-10-28 (×8): qty 2

## 2020-10-28 MED ORDER — INSULIN ASPART 100 UNIT/ML ~~LOC~~ SOLN
0.0000 [IU] | Freq: Three times a day (TID) | SUBCUTANEOUS | Status: DC
  Administered 2020-10-28: 3 [IU] via SUBCUTANEOUS
  Administered 2020-10-29: 2 [IU] via SUBCUTANEOUS
  Administered 2020-10-29 – 2020-10-30 (×4): 3 [IU] via SUBCUTANEOUS
  Administered 2020-10-31 – 2020-11-01 (×3): 2 [IU] via SUBCUTANEOUS

## 2020-10-28 MED ORDER — INSULIN ASPART 100 UNIT/ML ~~LOC~~ SOLN: 0.0000 [IU] | Freq: Every day | SUBCUTANEOUS | Status: DC

## 2020-10-28 MED ORDER — PHENYLEPHRINE HCL-NACL 20-0.9 MG/250ML-% IV SOLN
INTRAVENOUS | Status: DC | PRN
  Administered 2020-10-28: 25 ug/min via INTRAVENOUS

## 2020-10-28 MED ORDER — PROPOFOL 10 MG/ML IV BOLUS
INTRAVENOUS | Status: DC | PRN
Start: 2020-10-28 — End: 2020-10-28
  Administered 2020-10-28: 160 mg via INTRAVENOUS

## 2020-10-28 MED ORDER — LIDOCAINE HCL (CARDIAC) PF 100 MG/5ML IV SOSY
PREFILLED_SYRINGE | INTRAVENOUS | Status: DC | PRN
  Administered 2020-10-28: 60 mg via INTRATRACHEAL

## 2020-10-28 MED ORDER — HYDROMORPHONE HCL 1 MG/ML IJ SOLN
0.2500 mg | INTRAMUSCULAR | Status: DC | PRN
Start: 2020-10-28 — End: 2020-10-28
  Administered 2020-10-28 (×2): 0.25 mg via INTRAVENOUS
  Administered 2020-10-28: 0.5 mg via INTRAVENOUS
  Filled 2020-10-28 (×2): qty 0.5

## 2020-10-28 MED ORDER — SODIUM CHLORIDE (PF) 0.9 % IJ SOLN
INTRAMUSCULAR | Status: AC
  Filled 2020-10-28: qty 20

## 2020-10-28 MED ORDER — ORAL CARE MOUTH RINSE: 15.0000 mL | Freq: Once | OROMUCOSAL | Status: AC

## 2020-10-28 MED ORDER — CHLORHEXIDINE GLUCONATE 0.12 % MT SOLN
15.0000 mL | Freq: Once | OROMUCOSAL | Status: AC
  Administered 2020-10-28: 15 mL via OROMUCOSAL

## 2020-10-28 MED ORDER — HYDROMORPHONE HCL 1 MG/ML IJ SOLN
0.5000 mg | INTRAMUSCULAR | Status: DC | PRN
  Administered 2020-10-28 – 2020-10-29 (×3): 0.5 mg via INTRAVENOUS
  Administered 2020-10-29 (×2): 1 mg via INTRAVENOUS
  Administered 2020-10-29: 0.5 mg via INTRAVENOUS
  Administered 2020-10-29: 1 mg via INTRAVENOUS
  Administered 2020-10-29 – 2020-10-30 (×5): 0.5 mg via INTRAVENOUS
  Filled 2020-10-28 (×2): qty 0.5
  Filled 2020-10-28: qty 1
  Filled 2020-10-28 (×5): qty 0.5
  Filled 2020-10-28: qty 1
  Filled 2020-10-28 (×2): qty 0.5
  Filled 2020-10-28: qty 1

## 2020-10-28 MED ORDER — ONDANSETRON HCL 4 MG/2ML IJ SOLN
INTRAMUSCULAR | Status: DC | PRN
  Administered 2020-10-28: 4 mg via INTRAVENOUS

## 2020-10-28 MED ORDER — ROCURONIUM BROMIDE 10 MG/ML (PF) SYRINGE
PREFILLED_SYRINGE | INTRAVENOUS | Status: DC | PRN
  Administered 2020-10-28 (×3): 20 mg via INTRAVENOUS
  Administered 2020-10-28: 50 mg via INTRAVENOUS

## 2020-10-28 MED ORDER — HYOSCYAMINE SULFATE 0.125 MG SL SUBL: 0.1250 mg | SUBLINGUAL_TABLET | SUBLINGUAL | Status: DC | PRN

## 2020-10-28 MED ORDER — BUPIVACAINE LIPOSOME 1.3 % IJ SUSP
INTRAMUSCULAR | Status: AC
  Filled 2020-10-28: qty 20

## 2020-10-28 SURGICAL SUPPLY — 64 items
APPLIER CLIP 11 MED OPEN (CLIP)
APPLIER CLIP 13 LRG OPEN (CLIP) ×6
ATTRACTOMAT 16X20 MAGNETIC DRP (DRAPES) ×2 IMPLANT
CLIP APPLIE 11 MED OPEN (CLIP) IMPLANT
CLIP APPLIE 13 LRG OPEN (CLIP) ×3 IMPLANT
COVER SURGICAL LIGHT HANDLE (MISCELLANEOUS) ×4 IMPLANT
COVER WAND RF STERILE (DRAPES) ×2 IMPLANT
CUTTER FLEX LINEAR 45M (STAPLE) ×2 IMPLANT
DECANTER SPIKE VIAL GLASS SM (MISCELLANEOUS) ×4 IMPLANT
DRAIN CHANNEL 15F RND FF 3/16 (WOUND CARE) IMPLANT
DRAIN PENROSE 0.5X18 (DRAIN) IMPLANT
DRAPE INCISE IOBAN 66X45 STRL (DRAPES) ×2 IMPLANT
DRAPE WARM FLUID 44X44 (DRAPES) ×2 IMPLANT
DRSG OPSITE POSTOP 4X10 (GAUZE/BANDAGES/DRESSINGS) ×2 IMPLANT
ELECT BLADE 6 FLAT ULTRCLN (ELECTRODE) ×2 IMPLANT
ELECT REM PT RETURN 9FT ADLT (ELECTROSURGICAL) ×2
ELECTRODE REM PT RTRN 9FT ADLT (ELECTROSURGICAL) ×1 IMPLANT
EVACUATOR DRAINAGE 10X20 100CC (DRAIN) ×1 IMPLANT
EVACUATOR SILICONE 100CC (DRAIN) ×1
GAUZE 4X4 16PLY RFD (DISPOSABLE) IMPLANT
GAUZE SPONGE 4X4 12PLY STRL (GAUZE/BANDAGES/DRESSINGS) ×2 IMPLANT
GLOVE BIOGEL M 8.0 STRL (GLOVE) ×2 IMPLANT
GLOVE SRG 8 PF TXTR STRL LF DI (GLOVE) ×1 IMPLANT
GLOVE SS BIOGEL STRL SZ 6.5 (GLOVE) ×1 IMPLANT
GLOVE SUPERSENSE BIOGEL SZ 6.5 (GLOVE) ×1
GLOVE SURG UNDER POLY LF SZ7 (GLOVE) ×8 IMPLANT
GLOVE SURG UNDER POLY LF SZ7.5 (GLOVE) ×4 IMPLANT
GLOVE SURG UNDER POLY LF SZ8 (GLOVE) ×1
GOWN STRL REUS W/TWL LRG LVL3 (GOWN DISPOSABLE) ×6 IMPLANT
GOWN STRL REUS W/TWL XL LVL3 (GOWN DISPOSABLE) ×4 IMPLANT
HEMOSTAT ARISTA ABSORB 3G PWDR (HEMOSTASIS) IMPLANT
HEMOSTAT SURGICEL 4X8 (HEMOSTASIS) ×2 IMPLANT
KIT TURNOVER KIT A (KITS) ×2 IMPLANT
LIGASURE IMPACT 36 18CM CVD LR (INSTRUMENTS) ×2 IMPLANT
LOOP VESSEL MAXI BLUE (MISCELLANEOUS) IMPLANT
NEEDLE HYPO 21X1.5 SAFETY (NEEDLE) ×2 IMPLANT
NS IRRIG 1000ML POUR BTL (IV SOLUTION) ×6 IMPLANT
PACK ABDOMINAL MAJOR (CUSTOM PROCEDURE TRAY) ×2 IMPLANT
PAD ARMBOARD 7.5X6 YLW CONV (MISCELLANEOUS) ×2 IMPLANT
PENCIL SMOKE EVACUATOR (MISCELLANEOUS) ×2 IMPLANT
RELOAD 45 VASCULAR/THIN (ENDOMECHANICALS) ×2 IMPLANT
SET BASIN LINEN APH (SET/KITS/TRAYS/PACK) ×2 IMPLANT
SPONGE DRAIN TRACH 4X4 STRL 2S (GAUZE/BANDAGES/DRESSINGS) ×2 IMPLANT
SPONGE INTESTINAL PEANUT (DISPOSABLE) ×2 IMPLANT
SPONGE LAP 18X18 RF (DISPOSABLE) ×8 IMPLANT
SPONGE SURGIFOAM ABS GEL 100 (HEMOSTASIS) ×2 IMPLANT
STAPLER VISISTAT 35W (STAPLE) ×2 IMPLANT
SUT CHROMIC 0 CT 1 (SUTURE) ×8 IMPLANT
SUT ETHILON 3 0 FSL (SUTURE) ×2 IMPLANT
SUT ETHILON 3 0 PS 1 (SUTURE) IMPLANT
SUT MON AB 2-0 SH 27 (SUTURE)
SUT MON AB 2-0 SH27 (SUTURE) IMPLANT
SUT PDS AB 1 TP1 96 (SUTURE) IMPLANT
SUT PDS AB CT VIOLET #0 27IN (SUTURE) ×14 IMPLANT
SUT SILK 0 (SUTURE) ×2
SUT SILK 0 30XBRD TIE 6 (SUTURE) ×2 IMPLANT
SUT SILK 2 0 (SUTURE) ×1
SUT SILK 2-0 30XBRD TIE 12 (SUTURE) ×1 IMPLANT
SUT VIC AB 2-0 CT1 27 (SUTURE) ×3
SUT VIC AB 2-0 CT1 27XBRD (SUTURE) ×3 IMPLANT
SYR 20ML LL LF (SYRINGE) ×4 IMPLANT
TAPE UMBILICAL COTTON 1/8X30 (MISCELLANEOUS) IMPLANT
TOWEL OR 17X26 4PK STRL BLUE (TOWEL DISPOSABLE) ×2 IMPLANT
TRAY FOLEY MTR SLVR 16FR STAT (SET/KITS/TRAYS/PACK) ×2 IMPLANT

## 2020-10-28 NOTE — Anesthesia Procedure Notes (Signed)
Procedure Name: Intubation Date/Time: 10/28/2020 7:52 AM Performed by: Lorin Glass, CRNA Pre-anesthesia Checklist: Patient identified, Emergency Drugs available, Suction available and Patient being monitored Patient Re-evaluated:Patient Re-evaluated prior to induction Oxygen Delivery Method: Circle system utilized Preoxygenation: Pre-oxygenation with 100% oxygen Induction Type: IV induction Ventilation: Mask ventilation without difficulty Laryngoscope Size: Glidescope and 3 Grade View: Grade I Tube type: Oral Tube size: 7.0 mm Number of attempts: 1 Airway Equipment and Method: Stylet Placement Confirmation: ETT inserted through vocal cords under direct vision,  positive ETCO2 and breath sounds checked- equal and bilateral Secured at: 21 cm Tube secured with: Tape Dental Injury: Teeth and Oropharynx as per pre-operative assessment

## 2020-10-28 NOTE — Interval H&P Note (Signed)
History and Physical Interval Note:  10/28/2020 7:32 AM  Tammie [name]  has presented today for surgery, with the diagnosis of left emphysematous pyeloneprhritis.  The various methods of treatment have been discussed with the patient and family. After consideration of risks, benefits and other options for treatment, the patient has consented to  Procedure(s): NEPHRECTOMY- open simple  Aram Beecham to assist per Dr. Ronne Binning request (Left) as a surgical intervention.  The patient's history has been reviewed, patient examined, no change in status, stable for surgery.  I have reviewed the patient's chart and labs.  Questions were answered to the patient's satisfaction.     Wilkie Aye

## 2020-10-28 NOTE — Anesthesia Postprocedure Evaluation (Signed)
Anesthesia Post Note  Patient: Tammie [name]  Procedure(s) Performed: LEFT OPEN NEPHRECTOMY- SIMPLE (Left Flank)  Patient location during evaluation: PACU Anesthesia Type: General Level of consciousness: awake Pain management: pain level controlled Vital Signs Assessment: post-procedure vital signs reviewed and stable Respiratory status: spontaneous breathing and respiratory function stable Cardiovascular status: blood pressure returned to baseline and stable Postop Assessment: no headache and no apparent nausea or vomiting Anesthetic complications: no   No complications documented.   Last Vitals:  Vitals:   10/28/20 1245 10/28/20 1300  BP: 131/70 128/69  Pulse: 81 83  Resp: 19 14  Temp:    SpO2: 100% 100%    Last Pain:  Vitals:   10/28/20 1300  TempSrc:   PainSc: 2                  Windell Norfolk

## 2020-10-28 NOTE — Transfer of Care (Signed)
Immediate Anesthesia Transfer of Care Note  Patient: Tammie [name]  Procedure(s) Performed: LEFT OPEN NEPHRECTOMY- SIMPLE (Left Flank)  Patient Location: PACU  Anesthesia Type:General  Level of Consciousness: drowsy  Airway & Oxygen Therapy: Patient Spontanous Breathing and Patient connected to nasal cannula oxygen  Post-op Assessment: Report given to RN and Post -op Vital signs reviewed and stable  Post vital signs: Reviewed and stable  Last Vitals:  Vitals Value Taken Time  BP 93/58 10/28/20 1123  Temp    Pulse 82 10/28/20 1126  Resp 15 10/28/20 1126  SpO2 100 % 10/28/20 1126  Vitals shown include unvalidated device data.  Last Pain:  Vitals:   10/28/20 0650  TempSrc: Oral  PainSc: 0-No pain      Patients Stated Pain Goal: 9 (10/28/20 0650)  Complications: No complications documented.

## 2020-10-28 NOTE — Plan of Care (Signed)
  Problem: Education: Goal: Knowledge of General Education information will improve Description: Including pain rating scale, medication(s)/side effects and non-pharmacologic comfort measures Outcome: Progressing   Problem: Clinical Measurements: Goal: Ability to maintain clinical measurements within normal limits will improve Outcome: Progressing Goal: Will remain free from infection Outcome: Progressing   Problem: Activity: Goal: Risk for activity intolerance will decrease Outcome: Progressing   Problem: Nutrition: Goal: Adequate nutrition will be maintained Outcome: Progressing   Problem: Pain Managment: Goal: General experience of comfort will improve Outcome: Progressing   Problem: Safety: Goal: Ability to remain free from injury will improve Outcome: Progressing   Problem: Skin Integrity: Goal: Risk for impaired skin integrity will decrease Outcome: Progressing   

## 2020-10-28 NOTE — Anesthesia Preprocedure Evaluation (Signed)
Anesthesia Evaluation  Patient identified by MRN, date of birth, ID band Patient awake    Reviewed: Allergy & Precautions, H&P , NPO status , Patient's Chart, lab work & pertinent test results, reviewed documented beta blocker date and time   Airway Mallampati: II  TM Distance: >3 FB Neck ROM: full    Dental no notable dental hx.    Pulmonary pneumonia,    Pulmonary exam normal breath sounds clear to auscultation       Cardiovascular Exercise Tolerance: Good hypertension, negative cardio ROS   Rhythm:regular Rate:Normal     Neuro/Psych PSYCHIATRIC DISORDERS negative neurological ROS     GI/Hepatic negative GI ROS, Neg liver ROS,   Endo/Other  negative endocrine ROSdiabetes  Renal/GU CRFRenal disease  negative genitourinary   Musculoskeletal   Abdominal   Peds  Hematology negative hematology ROS (+)   Anesthesia Other Findings   Reproductive/Obstetrics negative OB ROS                             Anesthesia Physical Anesthesia Plan  ASA: III  Anesthesia Plan: General   Post-op Pain Management:    Induction: Intravenous  PONV Risk Score and Plan: Ondansetron  Airway Management Planned: Oral ETT  Additional Equipment:   Intra-op Plan:   Post-operative Plan:   Informed Consent: I have reviewed the patients History and Physical, chart, labs and discussed the procedure including the risks, benefits and alternatives for the proposed anesthesia with the patient or authorized representative who has indicated his/her understanding and acceptance.     Dental Advisory Given  Plan Discussed with: CRNA  Anesthesia Plan Comments: (2 Large Bore IV's.)        Anesthesia Quick Evaluation

## 2020-10-28 NOTE — Progress Notes (Signed)
Spoke with dr Ronne Binning re pt s labs- Hgb 7.7- will order 2 units prbc's

## 2020-10-28 NOTE — Op Note (Signed)
Preoperative diagnosis: Left emphysematous pyelonephritis  Postop diagnosis: Same  Procedure: 1.  Left open simple nephrectomy 2.  Lysis of adhesions, extensive  Surgeon: Wilkie Aye, MD  Co-Surgeon: Franky Macho, MD  Anesthesia: General  Estimated blood loss: 550 cc  Drains: 16 French Foley catheter, JP drain left flank  Specimens: Left simple nephrectomy  Antibiotics: gentamicin  Findings: 1 renal artery and 1 renal vein. Dense adhesions inferior pole of the kidney and posterior to the psoas.  Indications: Patient is a 60 year old with a history of left emphysematous pyelonephritis.   After discussing treatment options patient decided to proceed with left open simple nephrectomy.  Procedure in detail: Prior to procedure consent was obtained. Patient was brought to the operating room and briefing was done sure correct patient, correct procedure, correct site.  General anesthesia was in administered patient was placed in the right lateral decubitus position.  a 16 French catheter was placed. their abdomen and flank was then prepped and draped usual sterile fashion.  A 10cm incision was made parallel and superior to the 11th rib. We dissected over the 11th rib and then free the 11th rib from the intercostal muscles. We then entered the retroperitoneum. We resected the 11th rib and sent it for pathology. We first mobilized the inferior pole of the kindey with both blunt dissection and electocautery. We encountered the ureter which was clipped. We then used the Ligasure to dissect the dense adhesions on the posterior aspect of the kidney. The superior pole was then mobilized with blunt dissection and then the adrenal gland was freed from the kidney. The left adrenal vein was ligated with a metal clip. We then identified the renal arteyr and vein. The renal artery and vein wer ligated with multiple 0 silk ties. We then sharply dissected the renal vein and artery and the kidney was removed.  It was then sent for pathology. We irrigated the retroperitoneal cavity and noted no residual bleeding. We then placed Surgicel over the renal hilum.  We then placed a JP drain through a separate 0.5cm incision.  In the left flank. The drain was secured to the skin with a 2-0 nylon. Dr. Lovell Sheehan then closed to opening in the peritoneal cavity with running chronic suture. We then proceeded to closed the fascia with 0 PDS in an interrupted fashion. We then closed the subcutaneous tissue with 2-0 vicryl in a running fashion. We then closed the incision with staples.   This concluded the procedure which was well tolerated by the patient.  Complications: None  Condition: Stable, x-rayed, transferred to PACU.  Plan: Patient is to be admitted for inpatient stay. The foley catheter will be removed in the morning. They will be started on a clear liquid diet POD#1

## 2020-10-29 ENCOUNTER — Encounter (HOSPITAL_COMMUNITY): Payer: Self-pay | Admitting: Urology

## 2020-10-29 LAB — TYPE AND SCREEN
ABO/RH(D): A POS
Antibody Screen: NEGATIVE
Unit division: 0
Unit division: 0

## 2020-10-29 LAB — CBC
HCT: 31 % — ABNORMAL LOW (ref 36.0–46.0)
Hemoglobin: 9.2 g/dL — ABNORMAL LOW (ref 12.0–15.0)
MCH: 24.4 pg — ABNORMAL LOW (ref 26.0–34.0)
MCHC: 29.7 g/dL — ABNORMAL LOW (ref 30.0–36.0)
MCV: 82.2 fL (ref 80.0–100.0)
Platelets: 333 10*3/uL (ref 150–400)
RBC: 3.77 MIL/uL — ABNORMAL LOW (ref 3.87–5.11)
RDW: 17.1 % — ABNORMAL HIGH (ref 11.5–15.5)
WBC: 11.6 10*3/uL — ABNORMAL HIGH (ref 4.0–10.5)
nRBC: 0 % (ref 0.0–0.2)

## 2020-10-29 LAB — GLUCOSE, CAPILLARY
Glucose-Capillary: 149 mg/dL — ABNORMAL HIGH (ref 70–99)
Glucose-Capillary: 151 mg/dL — ABNORMAL HIGH (ref 70–99)
Glucose-Capillary: 94 mg/dL (ref 70–99)
Glucose-Capillary: 145 mg/dL — ABNORMAL HIGH (ref 70–99)

## 2020-10-29 LAB — HEMOGLOBIN A1C
Hgb A1c MFr Bld: 6 % — ABNORMAL HIGH (ref 4.8–5.6)
Mean Plasma Glucose: 125.5 mg/dL

## 2020-10-29 LAB — BASIC METABOLIC PANEL
Anion gap: 10 (ref 5–15)
BUN: 8 mg/dL (ref 6–20)
CO2: 25 mmol/L (ref 22–32)
Calcium: 8.4 mg/dL — ABNORMAL LOW (ref 8.9–10.3)
Chloride: 100 mmol/L (ref 98–111)
Creatinine, Ser: 0.6 mg/dL (ref 0.44–1.00)
GFR, Estimated: >60 mL/min (ref >60)
Glucose, Bld: 165 mg/dL — ABNORMAL HIGH (ref 70–99)
Potassium: 3.4 mmol/L — ABNORMAL LOW (ref 3.5–5.1)
Sodium: 135 mmol/L (ref 135–145)

## 2020-10-29 LAB — BPAM RBC
Blood Product Expiration Date: 202204182359
Blood Product Expiration Date: 202204192359
ISSUE DATE / TIME: 202203221343
ISSUE DATE / TIME: 202203221630
Unit Type and Rh: 6200
Unit Type and Rh: 6200

## 2020-10-29 MED ORDER — CHLORHEXIDINE GLUCONATE CLOTH 2 % EX PADS
6.0000 | MEDICATED_PAD | Freq: Every day | CUTANEOUS | Status: DC
  Administered 2020-10-29 – 2020-11-01 (×4): 6 via TOPICAL

## 2020-10-29 NOTE — Progress Notes (Signed)
1 Day Post-Op Subjective: Patient reports left flank pain that is controlled with narcotics. Hgb 9.2. Urine clear. JP 155cc. Negative flatus. No nausea  Objective: Vital signs in last 24 hours: Temp:  [97.1 F (36.2 C)-98.7 F (37.1 C)] 98.7 F (37.1 C) (03/23 1411) Pulse Rate:  [90-100] 100 (03/23 1411) Resp:  [16-20] 16 (03/23 1411) BP: (130-161)/(61-97) 130/61 (03/23 1411) SpO2:  [95 %-97 %] 95 % (03/23 1411)  Intake/Output from previous day: 03/22 0701 - 03/23 0700 In: 4517.5 [P.O.:490; I.V.:3106.7; Blood:787.8; IV Piggyback:133.1] Out: 1620 [Urine:875; Drains:145; Blood:550] Intake/Output this shift: Total I/O In: 480 [P.O.:480] Out: 2330 [Urine:2275; Drains:55]  Physical Exam:  General:alert, cooperative and appears stated age GI: soft, non tender, normal bowel sounds, no palpable masses, no organomegaly, no inguinal hernia Female genitalia: not done Extremities: extremities normal, atraumatic, no cyanosis or edema  Lab Results: Recent Labs    10/28/20 1155 10/29/20 0647  HGB 7.7* 9.2*  HCT 26.0* 31.0*   BMET Recent Labs    10/28/20 1155 10/29/20 0647  NA 138 135  K 3.8 3.4*  CL 104 100  CO2 25 25  GLUCOSE 217* 165*  BUN 12 8  CREATININE 0.71 0.60  CALCIUM 8.5* 8.4*   No results for input(s): LABPT, INR in the last 72 hours. No results for input(s): LABURIN in the last 72 hours. Results for orders placed or performed during the hospital encounter of 10/24/20  SARS CORONAVIRUS 2 (TAT 6-24 HRS) Nasopharyngeal Nasopharyngeal Swab     Status: None   Collection Time: 10/24/20 10:40 AM   Specimen: Nasopharyngeal Swab  Result Value Ref Range Status   SARS Coronavirus 2 NEGATIVE NEGATIVE Final    Comment: (NOTE) SARS-CoV-2 target nucleic acids are NOT DETECTED.  The SARS-CoV-2 RNA is generally detectable in upper and lower respiratory specimens during the acute phase of infection. Negative results do not preclude SARS-CoV-2 infection, do not rule  out co-infections with other pathogens, and should not be used as the sole basis for treatment or other patient management decisions. Negative results must be combined with clinical observations, patient history, and epidemiological information. The expected result is Negative.  Fact Sheet for Patients: HairSlick.no  Fact Sheet for Healthcare Providers: quierodirigir.com  This test is not yet approved or cleared by the [place] FDA and  has been authorized for detection and/or diagnosis of SARS-CoV-2 by FDA under an Emergency Use Authorization (EUA). This EUA will remain  in effect (meaning this test can be used) for the duration of the COVID-19 declaration under Se ction 564(b)(1) of the Act, 21 U.S.C. section 360bbb-3(b)(1), unless the authorization is terminated or revoked sooner.  Performed at [facility], [address] [place], [place] [address]     Studies/Results: No results found.  Assessment/Plan: POD#1 left simple nephrectomy  Continue foley catheter  PT/OT to assess  Continue current pain control regiment  Continue clear liquid diet   LOS: 1 day   Wilkie Aye 10/29/2020, 4:36 PM

## 2020-10-30 LAB — CBC
HCT: 32.5 % — ABNORMAL LOW (ref 36.0–46.0)
Hemoglobin: 10 g/dL — ABNORMAL LOW (ref 12.0–15.0)
MCH: 25 pg — ABNORMAL LOW (ref 26.0–34.0)
MCHC: 30.8 g/dL (ref 30.0–36.0)
MCV: 81.3 fL (ref 80.0–100.0)
Platelets: 373 10*3/uL (ref 150–400)
RBC: 4 MIL/uL (ref 3.87–5.11)
RDW: 17.2 % — ABNORMAL HIGH (ref 11.5–15.5)
WBC: 16.8 10*3/uL — ABNORMAL HIGH (ref 4.0–10.5)
nRBC: 0 % (ref 0.0–0.2)

## 2020-10-30 LAB — GLUCOSE, CAPILLARY
Glucose-Capillary: 161 mg/dL — ABNORMAL HIGH (ref 70–99)
Glucose-Capillary: 190 mg/dL — ABNORMAL HIGH (ref 70–99)
Glucose-Capillary: 147 mg/dL — ABNORMAL HIGH (ref 70–99)
Glucose-Capillary: 127 mg/dL — ABNORMAL HIGH (ref 70–99)

## 2020-10-30 MED ORDER — BISACODYL 10 MG RE SUPP
10.0000 mg | Freq: Every day | RECTAL | Status: DC | PRN
  Administered 2020-10-30: 10 mg via RECTAL
  Filled 2020-10-30: qty 1

## 2020-10-30 NOTE — TOC Initial Note (Signed)
Transition of Care [facility]) - Initial/Assessment Note    Patient Details  Name: Tammie [name] MRN: [id number] Date of Birth: 12-13-1960  [facility]) CM/SW Contact:    Elliot Gault, LCSW Phone Number: 10/30/2020, 3:39 PM  Clinical Narrative:                  Pt admitted from home. Anticipating dc over the weekend. PT/OT recommending HH therapy at dc. Spoke with pt to discuss. Pt plans to return home at dc. She has family support. Explained that TOC cannot find HH agencies to accept [employer] and offered outpatient therapy. Pt is agreeable to this and states that she prefers to go in [place].   Updated MD to request orders. TOC will follow up to refer once orders are entered.  Expected Discharge Plan: Home/Self Care Barriers to Discharge: Continued Medical Work up   Patient Goals and CMS Choice Patient states their goals for this hospitalization and ongoing recovery are:: go home CMS Medicare.gov Compare Post Acute Care list provided to:: Patient Choice offered to / list presented to : Patient  Expected Discharge Plan and Services Expected Discharge Plan: Home/Self Care In-house Referral: Clinical Social Work   Post Acute Care Choice:  (outpatient therapy) Living arrangements for the past 2 months: Single Family Home                                      Prior Living Arrangements/Services Living arrangements for the past 2 months: Single Family Home Lives with:: Spouse Patient language and need for interpreter reviewed:: Yes        Need for Family Participation in Patient Care: Yes (Comment) Care giver support system in place?: Yes (comment)   Criminal Activity/Legal Involvement Pertinent to Current Situation/Hospitalization: No - Comment as needed  Activities of Daily Living      Permission Sought/Granted Permission sought to share information with : Oceanographer granted to share information with : Yes,  Verbal Permission Granted     Permission granted to share info w AGENCY: outpatient rehab        Emotional Assessment   Attitude/Demeanor/Rapport: Engaged Affect (typically observed): Pleasant Orientation: : Oriented to Self,Oriented to Place,Oriented to  Time,Oriented to Situation Alcohol / Substance Use: Not Applicable Psych Involvement: No (comment)  Admission diagnosis:  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 Pyelonephritis   . Perinephric abscess 09/02/2020  . Sepsis secondary to UTI (HCC) 09/01/2020  . Type 2 diabetes mellitus with hyperglycemia (HCC) 09/01/2020  . Hypoalbuminemia 09/01/2020  . Nephrostomy tube displaced (HCC) 09/01/2020  . Elevated troponin   . Pleural effusion   . Emphysematous pyelonephritis of left kidney   . Ileus (HCC) 08/08/2020  . Severe sepsis (HCC) 08/07/2020  . Perirenal abscess   . E coli bacteremia   . Acute pyelonephritis   . Bibasilar crackles   . Diabetic ketoacidosis without coma associated with type 2 diabetes mellitus (HCC)   . Delirium   . Hyperosmolar hyperglycemic state (HHS) (HCC) 08/01/2020  . Hyponatremia 08/01/2020  . Hypertension   . Acute renal failure (HCC)   . Hypokalemia   . Severe protein-calorie malnutrition (HCC)   . Class 1 obesity   . Leukocytosis   . Hypomagnesemia   . Hyperphosphatemia   . Abdominal distention   . Lung nodules   .  Multifocal pneumonia 07/31/2020   PCP:  Joya Salm Pharmacy:   [facility] [address], [place] - [address] [place] [address] Phone: [phone] Fax: [phone]     Social Determinants of Health (SDOH) Interventions    Readmission Risk Interventions Readmission Risk Prevention Plan 10/30/2020  Transportation Screening Complete  Home Care Screening Complete  Medication Review (RN CM) Complete

## 2020-10-30 NOTE — Plan of Care (Signed)
  Problem: Acute Rehab PT Goals(only PT should resolve) Goal: Pt Will Go Supine/Side To Sit Outcome: Progressing Flowsheets (Taken 10/30/2020 0937) Pt will go Supine/Side to Sit:  with modified independence  with supervision Goal: Patient Will Transfer Sit To/From Stand Outcome: Progressing Flowsheets (Taken 10/30/2020 [phone]) Patient will transfer sit to/from stand:  with modified independence  with supervision Goal: Pt Will Transfer Bed To Chair/Chair To Bed Outcome: Progressing Flowsheets (Taken 10/30/2020 0937) Pt will Transfer Bed to Chair/Chair to Bed:  with modified independence  with supervision Goal: Pt Will Ambulate Outcome: Progressing Flowsheets (Taken 10/30/2020 0937) Pt will Ambulate:  100 feet  with supervision  with cane   9:37 AM, 10/30/20 Ocie Bob, MPT Physical Therapist with [facility] [phone] office [phone] mobile phone

## 2020-10-30 NOTE — Plan of Care (Signed)
  Problem: Acute Rehab OT Goals (only OT should resolve) Goal: Pt. Will Perform Grooming Flowsheets (Taken 10/30/2020 1005) Pt Will Perform Grooming:  with modified independence  standing Goal: Pt. Will Perform Lower Body Dressing Flowsheets (Taken 10/30/2020 1005) Pt Will Perform Lower Body Dressing:  with modified independence  with adaptive equipment  sitting/lateral leans Goal: Pt. Will Transfer To Toilet Flowsheets (Taken 10/30/2020 1005) Pt Will Transfer to Toilet:  with modified independence  stand pivot transfer  grab bars   Tyrick Dunagan OT, MOT

## 2020-10-30 NOTE — Evaluation (Signed)
Occupational Therapy Evaluation Patient Details Name: Tammie [name] MRN: [id number] DOB: 1960/08/27 Today's Date: 10/30/2020    History of Present Illness Tammie [name] is a 60 y/o female, s/p Left simple nephrectomy on 10/28/20 with the diagnosis of left emphysematous pyeloneprhritis.   Clinical Impression   Pt agreeable to OT/PT co-evaluation this date. Pt demonstrated supine to sit bed mobility with minimal assist. Ambulatory transfer from EOB to ambulating in hall to chair required min guard assist. Hand held assist needed for balance. Pt required total assist to don socks at bed level. Pt limited primarily by left flank pain. Pt would benefit from continued OT services in the hospital to address strength, endurance, balance, and ADL's prior to d/c with the below recommendations.     Follow Up Recommendations  Home health OT    Equipment Recommendations  Other (comment) (Recomend grab bar in shower for pt safety per report of using soap dish to assist with tub shower transfers.)           Precautions / Restrictions Precautions Precautions: Fall Restrictions Weight Bearing Restrictions: No      Mobility Bed Mobility Overal bed mobility: Needs Assistance Bed Mobility: Supine to Sit     Supine to sit: Min assist     General bed mobility comments: limited mostly due to left flank pain with frequent guarding using LUE    Transfers Overall transfer level: Needs assistance Equipment used: 1 person hand held assist Transfers: Sit to/from [employer] Sit to Stand: Min guard Stand pivot transfers: Min guard       General transfer comment: unsteady with tendency to lean on nearby objects for support, required hand held assist [facility]) for safety    Balance Overall balance assessment: Needs assistance Sitting-balance support: Feet supported;No upper extremity supported Sitting balance-Leahy Scale: Good Sitting balance - Comments: seated at EOB   Standing  balance support: During functional activity;Single extremity supported Standing balance-Leahy Scale: Fair Standing balance comment: fair/poor without AD, fair/good with HHA                           ADL either performed or assessed with clinical judgement   ADL Overall ADL's : Needs assistance/impaired                     Lower Body Dressing: Total assistance;Bed level Lower Body Dressing Details (indicate cue type and reason): Bed level; pt reported needing assist. Toilet Transfer: Min guard Toilet Transfer Details (indicate cue type and reason): Simulated via ambulatory transfer from EOB to chair with min guard assist ; hand held steady assist from PT.                 Vision Baseline Vision/History: No visual deficits                  Pertinent Vitals/Pain Pain Assessment: 0-10 Pain Score: 7  Pain Location: left flank Pain Descriptors / Indicators: Sore;Grimacing;Guarding Pain Intervention(s): Limited activity within patient's tolerance     Hand Dominance Right   Extremity/Trunk Assessment Upper Extremity Assessment Upper Extremity Assessment: Generalized weakness (Appeared more limited by pain in left flank then UE deficits.)   Lower Extremity Assessment Lower Extremity Assessment: Defer to PT evaluation   Cervical / Trunk Assessment Cervical / Trunk Assessment: Normal   Communication Communication Communication: No difficulties   Cognition Arousal/Alertness: Awake/alert Behavior During Therapy: WFL for tasks assessed/performed Overall Cognitive Status: Within Functional Limits  for tasks assessed                                                      Home Living Family/patient expects to be discharged to:: Private residence Living Arrangements: Spouse/significant other;Children Available Help at Discharge: Family;Available 24 hours/day Type of Home: House Home Access: Stairs to enter [employer] of  Steps: 1 Entrance Stairs-Rails: Right Home Layout: One level     Bathroom Shower/Tub: Chief Strategy Officer: Standard Bathroom Accessibility: Yes   Home Equipment: Cane - single point   Additional Comments: has been taking sponge baths recently due precautions for drains      Prior Functioning/Environment Level of Independence: Needs assistance  Gait / Transfers Assistance Needed: Independent ambulator ADL's / Homemaking Assistance Needed: Husband assisted with bathing; assist needed for cooking and cleaning.   Comments: Tourist information centre manager, drives        OT Problem List: Decreased strength;Decreased activity tolerance;Impaired balance (sitting and/or standing);Pain      OT Treatment/Interventions: Self-care/ADL training;Therapeutic exercise;DME and/or AE instruction;Therapeutic activities;Patient/family education;Balance training    OT Goals(Current goals can be found in the care plan section) Acute Rehab OT Goals Patient Stated Goal: return home with family to assist OT Goal Formulation: With patient Time For Goal Achievement: 11/13/20 Potential to Achieve Goals: Good  OT Frequency: Min 2X/week               Co-evaluation PT/OT/SLP Co-Evaluation/Treatment: Yes Reason for Co-Treatment: To address functional/ADL transfers PT goals addressed during session: Mobility/safety with mobility;Balance;Proper use of DME OT goals addressed during session: ADL's and self-care;Strengthening/ROM                       End of Session    Activity Tolerance: Patient tolerated treatment well;Patient limited by pain Patient left: in chair;with call bell/phone within reach;with chair alarm set  OT Visit Diagnosis: Unsteadiness on feet (R26.81);Pain Pain - Right/Left: Left Pain - part of body:  (left flank)                Time: 5701-7793 OT Time Calculation (min): 19 min Charges:  OT General Charges $OT Visit: 1 Visit OT Evaluation $OT Eval Low  Complexity: 1 Low  Tammie [name] OT, MOT   Tammie [name] 10/30/2020, 10:01 AM

## 2020-10-30 NOTE — Progress Notes (Signed)
2 Days Post-Op Subjective: Patient reports improved pain control. Hgb 10. Negative flatus. Nausea this morning which has resolved.  JP 135cc Objective: Vital signs in last 24 hours: Temp:  [97.5 F (36.4 C)-98.2 F (36.8 C)] 98.2 F (36.8 C) (03/24 0744) Pulse Rate:  [99-102] 99 (03/24 0744) Resp:  [18-20] 18 (03/24 0744) BP: (126-155)/(64-73) 155/73 (03/24 0744) SpO2:  [94 %-96 %] 94 % (03/24 0349)  Intake/Output from previous day: 03/23 0701 - 03/24 0700 In: 480 [P.O.:480] Out: 3660 [Urine:3525; Drains:135] Intake/Output this shift: Total I/O In: -  Out: 650 [Urine:650]  Physical Exam:  General:alert, cooperative and appears stated age GI: soft, non tender, normal bowel sounds, no palpable masses, no organomegaly, no inguinal hernia Female genitalia: not done Extremities: extremities normal, atraumatic, no cyanosis or edema  Lab Results: Recent Labs    10/28/20 1155 10/29/20 0647 10/30/20 0813  HGB 7.7* 9.2* 10.0*  HCT 26.0* 31.0* 32.5*   BMET Recent Labs    10/28/20 1155 10/29/20 0647  NA 138 135  K 3.8 3.4*  CL 104 100  CO2 25 25  GLUCOSE 217* 165*  BUN 12 8  CREATININE 0.71 0.60  CALCIUM 8.5* 8.4*   No results for input(s): LABPT, INR in the last 72 hours. No results for input(s): LABURIN in the last 72 hours. Results for orders placed or performed during the hospital encounter of 10/24/20  SARS CORONAVIRUS 2 (TAT 6-24 HRS) Nasopharyngeal Nasopharyngeal Swab     Status: None   Collection Time: 10/24/20 10:40 AM   Specimen: Nasopharyngeal Swab  Result Value Ref Range Status   SARS Coronavirus 2 NEGATIVE NEGATIVE Final    Comment: (NOTE) SARS-CoV-2 target nucleic acids are NOT DETECTED.  The SARS-CoV-2 RNA is generally detectable in upper and lower respiratory specimens during the acute phase of infection. Negative results do not preclude SARS-CoV-2 infection, do not rule out co-infections with other pathogens, and should not be used as the sole  basis for treatment or other patient management decisions. Negative results must be combined with clinical observations, patient history, and epidemiological information. The expected result is Negative.  Fact Sheet for Patients: HairSlick.no  Fact Sheet for Healthcare Providers: quierodirigir.com  This test is not yet approved or cleared by the [place] FDA and  has been authorized for detection and/or diagnosis of SARS-CoV-2 by FDA under an Emergency Use Authorization (EUA). This EUA will remain  in effect (meaning this test can be used) for the duration of the COVID-19 declaration under Se ction 564(b)(1) of the Act, 21 U.S.C. section 360bbb-3(b)(1), unless the authorization is terminated or revoked sooner.  Performed at [facility], [address], [place], [place] [address]     Studies/Results: No results found.  Assessment/Plan: POD#2 left simple nephrectomy  Continue foley catheter  PT/OT to assess  Continue current pain control regiment  Continue clear liquid diet  Ambulate in halls with assistance   LOS: 2 days   Wilkie Aye 10/30/2020, 2:24 PM

## 2020-10-30 NOTE — Evaluation (Signed)
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    Clinical Impression  Patient has difficulty sitting up at bedside due to left flank pain with frequent guarding left side with LUE, limiting LUE use for sitting up at bedside, decreased left arm swing when walking and requiring hand held assist [facility]) for safety due to frequent leaning on nearby objects for support.  Patient tolerated sitting up in chair after therapy.  Patient will benefit from continued physical therapy in hospital and recommended venue below to increase strength, balance, endurance for safe ADLs and gait.     Follow Up Recommendations Home health PT;Supervision for mobility/OOB;Supervision - Intermittent    Equipment Recommendations  None recommended by PT    Recommendations for Other Services       Precautions / Restrictions Precautions Precautions: Fall Restrictions Weight Bearing Restrictions: No      Mobility  Bed Mobility Overal bed mobility: Needs Assistance Bed Mobility: Supine to Sit     Supine to sit: Min assist     General bed mobility comments: limited mostly due to left flank pain with frequent guarding using LUE    Transfers Overall transfer level: Needs assistance Equipment used: 1 person hand held assist Transfers: Sit to/from [employer] Sit to Stand: Min guard Stand pivot transfers: Min guard       General transfer comment: unsteady with tendency to lean on nearby objects for support, required hand held assist (HHA) for safety  Ambulation/Gait Ambulation/Gait assistance: Min guard Gait Distance (Feet): 80 Feet Assistive device: 1 person hand held assist Gait Pattern/deviations: Decreased step length - right;Decreased step length -  left;Decreased stride length Gait velocity: decreased   General Gait Details: slightly labored cadence with limited left arm swing due to holding onto left flank, no loss of balance and limited mostly due to fatigue left flank pain  Stairs            Wheelchair Mobility    Modified Rankin (Stroke Patients Only)       Balance Overall balance assessment: Needs assistance Sitting-balance support: Feet supported;No upper extremity supported Sitting balance-Leahy Scale: Good Sitting balance - Comments: seated at EOB   Standing balance support: During functional activity;Single extremity supported Standing balance-Leahy Scale: Fair Standing balance comment: fair/poor without AD, fair/good with HHA                             Pertinent Vitals/Pain Pain Assessment: 0-10 Pain Score: 7  Pain Location: left flank Pain Descriptors / Indicators: Sore;Grimacing;Guarding Pain Intervention(s): Limited activity within patient's tolerance;Monitored during session;Repositioned    Home Living Family/patient expects to be discharged to:: Private residence Living Arrangements: Spouse/significant other;Children Available Help at Discharge: Family;Available 24 hours/day Type of Home: House Home Access: Stairs to enter Entrance Stairs-Rails: Right Entrance Stairs-Number of Steps: 1 Home Layout: One level Home Equipment: Cane - single point Additional Comments: has been taking sponge baths recently due precautions for drains    Prior Function Level of Independence: Independent         Comments: Tourist information centre manager, drives     Hand Dominance        Extremity/Trunk Assessment   Upper Extremity Assessment Upper Extremity Assessment: Defer to OT evaluation    Lower  Extremity Assessment Lower Extremity Assessment: Generalized weakness    Cervical / Trunk Assessment Cervical / Trunk Assessment: Normal  Communication   Communication: No difficulties  Cognition  Arousal/Alertness: Awake/alert Behavior During Therapy: WFL for tasks assessed/performed Overall Cognitive Status: Within Functional Limits for tasks assessed                                        General Comments      Exercises     Assessment/Plan    PT Assessment Patient needs continued PT services  PT Problem List Decreased strength;Decreased activity tolerance;Decreased balance;Decreased mobility       PT Treatment Interventions DME instruction;Gait training;Stair training;Functional mobility training;Therapeutic activities;Therapeutic exercise;Patient/family education;Balance training    PT Goals (Current goals can be found in the Care Plan section)  Acute Rehab PT Goals Patient Stated Goal: return home with family to assist PT Goal Formulation: With patient Time For Goal Achievement: 11/06/20 Potential to Achieve Goals: Good    Frequency Min 3X/week   Barriers to discharge        Co-evaluation PT/OT/SLP Co-Evaluation/Treatment: Yes Reason for Co-Treatment: Complexity of the patient's impairments (multi-system involvement);For patient/therapist safety PT goals addressed during session: Mobility/safety with mobility;Balance;Proper use of DME         AM-PAC PT "6 Clicks" Mobility  Outcome Measure Help needed turning from your back to your side while in a flat bed without using bedrails?: A Little Help needed moving from lying on your back to sitting on the side of a flat bed without using bedrails?: A Little Help needed moving to and from a bed to a chair (including a wheelchair)?: A Little Help needed standing up from a chair using your arms (e.g., wheelchair or bedside chair)?: A Little Help needed to walk in hospital room?: A Little Help needed climbing 3-5 steps with a railing? : A Little 6 Click Score: 18    End of Session   Activity Tolerance: Patient tolerated treatment well;Patient limited by fatigue;Patient limited by pain Patient  left: in chair;with call bell/phone within reach;with chair alarm set Nurse Communication: Mobility status PT Visit Diagnosis: Unsteadiness on feet (R26.81);Other abnormalities of gait and mobility (R26.89);Muscle weakness (generalized) (M62.81)    Time: 5053-9767 PT Time Calculation (min) (ACUTE ONLY): 20 min   Charges:   PT Evaluation $PT Eval Moderate Complexity: 1 Mod PT Treatments $Therapeutic Activity: 23-37 mins        9:35 AM, 10/30/20 Ocie Bob, MPT Physical Therapist with [facility] [phone] office [phone] mobile phone

## 2020-10-31 DIAGNOSIS — L8990 Pressure ulcer of unspecified site, unspecified stage: Secondary | ICD-10-CM | POA: Insufficient documentation

## 2020-10-31 LAB — CBC
HCT: 33 % — ABNORMAL LOW (ref 36.0–46.0)
Hemoglobin: 9.6 g/dL — ABNORMAL LOW (ref 12.0–15.0)
MCH: 23.9 pg — ABNORMAL LOW (ref 26.0–34.0)
MCHC: 29.1 g/dL — ABNORMAL LOW (ref 30.0–36.0)
MCV: 82.3 fL (ref 80.0–100.0)
Platelets: 398 10*3/uL (ref 150–400)
RBC: 4.01 MIL/uL (ref 3.87–5.11)
RDW: 16.9 % — ABNORMAL HIGH (ref 11.5–15.5)
WBC: 11.7 10*3/uL — ABNORMAL HIGH (ref 4.0–10.5)
nRBC: 0 % (ref 0.0–0.2)

## 2020-10-31 LAB — GLUCOSE, CAPILLARY
Glucose-Capillary: 143 mg/dL — ABNORMAL HIGH (ref 70–99)
Glucose-Capillary: 119 mg/dL — ABNORMAL HIGH (ref 70–99)
Glucose-Capillary: 128 mg/dL — ABNORMAL HIGH (ref 70–99)
Glucose-Capillary: 117 mg/dL — ABNORMAL HIGH (ref 70–99)

## 2020-10-31 LAB — SURGICAL PATHOLOGY

## 2020-10-31 MED ORDER — OXYCODONE-ACETAMINOPHEN 5-325 MG PO TABS: 1.0000 | ORAL_TABLET | ORAL | 0 refills | Status: DC | PRN

## 2020-10-31 MED ORDER — SULFAMETHOXAZOLE-TRIMETHOPRIM 800-160 MG PO TABS: 1.0000 | ORAL_TABLET | Freq: Two times a day (BID) | ORAL | 0 refills | Status: AC

## 2020-10-31 NOTE — Progress Notes (Signed)
Physical Therapy Treatment Patient Details Name: Tammie [name] MRN: [id number] DOB: 1961-02-17 Today's Date: 10/31/2020    History of Present Illness Tammie [name] is a 60 y/o female, s/p Left simple nephrectomy on 10/28/20 with the diagnosis of left emphysematous pyeloneprhritis.    PT Comments    Patient seated EOB at beginning of session. Patient with increase in left flank pain with attempting march on L side due to trunk muscle bracing with seated balance reactions. Patient able to complete remaining seated exercises without increase in symptoms. Patient transfers to standing with slow, labored transfer without physical assist but use of RW. Patient ambulates increased distance with use of RW today and requires several short, standing rest breaks due to fatigue and pain with ambulation. Patient returned to seated EOB at end of session. Patient will benefit from continued physical therapy in hospital and recommended venue below to increase strength, balance, endurance for safe ADLs and gait.   Follow Up Recommendations  Home health PT;Supervision for mobility/OOB;Supervision - Intermittent     Equipment Recommendations  Rolling walker with 5" wheels    Recommendations for Other Services       Precautions / Restrictions Precautions Precautions: Fall Restrictions Weight Bearing Restrictions: No    Mobility  Bed Mobility               General bed mobility comments: Patient seated EOB at beginning of session    Transfers Overall transfer level: Needs assistance Equipment used: Rolling walker (2 wheeled) Transfers: Sit to/from [employer] Sit to Stand: Min guard Stand pivot transfers: Min guard       General transfer comment: slow, labored with use of RW  Ambulation/Gait Ambulation/Gait assistance: Min guard Gait Distance (Feet): 100 Feet Assistive device: Rolling walker (2 wheeled) Gait Pattern/deviations: Decreased step length - right;Decreased  step length - left;Decreased stride length Gait velocity: decreased   General Gait Details: slow, labored cadence with use of RW, several short, standing rest breaks   Stairs             Wheelchair Mobility    Modified Rankin (Stroke Patients Only)       Balance Overall balance assessment: Needs assistance Sitting-balance support: Feet supported;No upper extremity supported Sitting balance-Leahy Scale: Good Sitting balance - Comments: seated at EOB   Standing balance support: During functional activity;Single extremity supported Standing balance-Leahy Scale: Good Standing balance comment: good with RW                            Cognition Arousal/Alertness: Awake/alert Behavior During Therapy: WFL for tasks assessed/performed Overall Cognitive Status: Within Functional Limits for tasks assessed                                        Exercises General Exercises - Lower Extremity Long Arc Quad: AROM;Both;10 reps;Seated Hip Flexion/Marching: AROM;Both;10 reps;Seated Toe Raises: AROM;Both;10 reps;Seated Heel Raises: AROM;Both;10 reps;Seated    General Comments        Pertinent Vitals/Pain Pain Score: 7  Pain Location: left flank Pain Descriptors / Indicators: Sore;Grimacing;Guarding Pain Intervention(s): Limited activity within patient's tolerance;Monitored during session    Home Living                      Prior Function            PT Goals (  current goals can now be found in the care plan section) Acute Rehab PT Goals Patient Stated Goal: return home with family to assist PT Goal Formulation: With patient Time For Goal Achievement: 11/06/20 Potential to Achieve Goals: Good Progress towards PT goals: Progressing toward goals    Frequency    Min 3X/week      PT Plan Current plan remains appropriate;Equipment recommendations need to be updated    Co-evaluation              AM-PAC PT "6 Clicks"  Mobility   Outcome Measure  Help needed turning from your back to your side while in a flat bed without using bedrails?: A Little Help needed moving from lying on your back to sitting on the side of a flat bed without using bedrails?: A Little Help needed moving to and from a bed to a chair (including a wheelchair)?: A Little Help needed standing up from a chair using your arms (e.g., wheelchair or bedside chair)?: A Little Help needed to walk in hospital room?: A Little Help needed climbing 3-5 steps with a railing? : A Little 6 Click Score: 18    End of Session   Activity Tolerance: Patient tolerated treatment well;Patient limited by fatigue;Patient limited by pain Patient left: with call bell/phone within reach;in bed;with family/visitor present Nurse Communication: Mobility status PT Visit Diagnosis: Unsteadiness on feet (R26.81);Other abnormalities of gait and mobility (R26.89);Muscle weakness (generalized) (M62.81)     Time: 1859-0931 PT Time Calculation (min) (ACUTE ONLY): 15 min  Charges:  $Therapeutic Exercise: 8-22 mins                     2:32 PM, 10/31/20 Wyman Songster PT, DPT Physical Therapist at [facility]

## 2020-10-31 NOTE — Discharge Instructions (Signed)
Open Nephrectomy, Care After This sheet gives you information about how to care for yourself after your procedure. Your health care provider may also give you more specific instructions. If you have problems or questions, contact your health care provider. What can I expect after the procedure? After the procedure, it is common to have:  Pain in your side and abdomen. Pain may be worse when you cough or breathe deeply.  Sore and weak muscles. It may be difficult to get up if you are sitting or lying down.  Trouble finding a comfortable sleeping position.  Numbness in the area around your incision. Follow these instructions at home: Medicines  Take over-the-counter and prescription medicines only as told by your health care provider.  Avoid using NSAIDs regularly over a long period of time. These include aspirin and ibuprofen. Taking NSAIDs often can eventually damage your remaining kidney.  Ask your health care provider if the medicine prescribed to you: ? Requires you to avoid driving or using machinery. ? Can cause constipation. You may need to take these actions to prevent or treat constipation:  Drink enough fluid to keep your urine pale yellow.  Take over-the-counter or prescription medicines.  Eat foods that are high in fiber, such as beans, whole grains, and fresh fruits and vegetables.  Limit foods that are high in fat and processed sugars, such as fried or sweet foods.   Incision care and drain care  Follow instructions from your health care provider about how to take care of your incision. Make sure you: ? Wash your hands with soap and water for at least 20 seconds before and after you change your bandage (dressing). If soap and water are not available, use hand sanitizer. ? Change your dressing as told by your health care provider. ? Leave stitches (sutures), staples, skin glue, or adhesive strips in place. These skin closures may need to stay in place for 2 weeks or  longer. If adhesive strip edges start to loosen and curl up, you may trim the loose edges. Do not remove adhesive strips completely unless your health care provider tells you to do that.  Check your incision area every day for signs of infection. Check for: ? Redness, swelling, or more pain. ? Fluid or blood. ? Warmth. ? Pus or a bad smell.  If you have a drain in place, follow your health care provider's instructions for drain care. This may include emptying the drain and keeping track of the amount of drainage.   Bathing Do not take baths, swim, or use a hot tub until your health care provider approves. Ask your health care provider if you may take showers. You may only be allowed to take sponge baths. Activity  Rest as told by your health care provider.  Avoid sitting for a long time without moving. Get up to take short walks every 1-2 hours. This is important to improve blood flow and breathing. Ask for help if you feel weak or unsteady.  Avoid activity that requires a lot of energy, such as running. Ask your health care provider what activities are safe for you during recovery and what activities you need to avoid.  Do not lift anything that is heavier than 10 lb (4.5 kg), or the limit that you are told, until your health care provider says that it is safe.  Do not play contact sports. Doing so may damage your remaining kidney.  Do not drive until your health care provider approves.   Catheter  care If you have a urinary catheter, care for it as told by your health care provider. You may be told to:  Wash your hands with soap and water for at least 20 seconds before and after touching the catheter, tubing, or drainage bag. If soap and water are not available, use hand sanitizer.  Empty the drainage bag every 2-4 hours, or more often if needed. Do not let the bag get completely full.  Monitor the amount and color of your urine as told by your health care provider.  Keep the area  around the catheter clean and dry.  Make sure to keep the catheter secured to avoid pulling and accidental removal.  Always keep the urine collection bag below the level of your bladder.  Check the catheter tubing regularly to make sure there are no kinks or blockages.  Make sure that the catheter is not placed under water. General instructions  Do deep breathing exercises as told by your health care provider. These help to prevent lung infection (pneumonia).  If you need to cough, try holding a pillow against your abdomen. This may help with discomfort.  If lying down flat to sleep is uncomfortable, try sleeping in a chair until your incision heals more.  Wear compression stockings as told by your health care provider. These stockings help to prevent blood clots and reduce swelling in your legs.  Keep all follow-up visits. This is important. Contact a health care provider if:  You have a fever or chills.  You have pain that does not get better with medicine.  You have redness, swelling, or more pain around your incision.  You have a cough.  Your incision is warm to the touch.  You have blood in your urine.  You have not had a bowel movement in 3 days.  You have diarrhea. Get help right away if:  You have trouble breathing or feel short of breath.  You have chest pain.  You have severe pain.  There is fluid or blood coming from your incision.  There is pus or a bad smell coming from your incision.  You cannot urinate.  You have warmth, redness, and tenderness in your leg. These symptoms may represent a serious problem that is an emergency. Do not wait to see if the symptoms will go away. Get medical help right away. Call your local emergency services (911 in the [place] Do not drive yourself to the hospital. Summary  After the procedure, it is common to have pain in your side or abdomen.  Take over-the-counter and prescription medicines only as told by your  health care provider.  Check your incision every day. Contact your health care provider if you notice signs of infection.  Follow instructions from your health care provider about exercise, driving, and general activities. Ask your health care provider what activities are safe for you. This information is not intended to replace advice given to you by your health care provider. Make sure you discuss any questions you have with your health care provider. Document Revised: 11/19/2019 Document Reviewed: 11/19/2019 Elsevier Patient Education  2021 [employer]  

## 2020-10-31 NOTE — Progress Notes (Signed)
Patient ambulated several times throughout the shift. Foley was removed around 0830, MD McKenzie removed honeycomb dressing surgical incision loks clean, verbal order to leave open to air, patient still had not voided at noon so I bladder scanned her, she had 117 ml so I took her to sit on the toilet where she only had clear stools come down, bladder scanned her again around 2 pm she had 208 ml urine so she sat on toilet again and then urinated. Verbalized around 530 pm that she urinated again while I went to lunch. Right hand piv was leaking so I removed it. Spouse was at the bedside for about 3 hours. Diet advanced today and abx switched to oral. Currently in bed call bell in reach will continue to monitor

## 2020-10-31 NOTE — Progress Notes (Signed)
3 Days Post-Op Subjective: Patient reports improved pain control. WBC count decreased to 11.7. Hgb 9.6. Patient ambulating better with PT.  Objective: Vital signs in last 24 hours: Temp:  [97.5 F (36.4 C)-98.3 F (36.8 C)] 97.5 F (36.4 C) (03/25 0453) Pulse Rate:  [88-89] 88 (03/24 2008) Resp:  [16-20] 20 (03/25 0453) BP: (113-139)/(55-71) 139/71 (03/25 0453) SpO2:  [95 %-98 %] 95 % (03/25 0453)  Intake/Output from previous day: 03/24 0701 - 03/25 0700 In: 435 [P.O.:360] Out: 1750 [Urine:1750] Intake/Output this shift: Total I/O In: 240 [P.O.:240] Out: -   Physical Exam:  General:alert, cooperative and appears stated age GI: soft, non tender, normal bowel sounds, no palpable masses, no organomegaly, no inguinal hernia Female genitalia: not done Extremities: extremities normal, atraumatic, no cyanosis or edema  Lab Results: Recent Labs    10/29/20 0647 10/30/20 0813 10/31/20 0915  HGB 9.2* 10.0* 9.6*  HCT 31.0* 32.5* 33.0*   BMET Recent Labs    10/29/20 0647  NA 135  K 3.4*  CL 100  CO2 25  GLUCOSE 165*  BUN 8  CREATININE 0.60  CALCIUM 8.4*   No results for input(s): LABPT, INR in the last 72 hours. No results for input(s): LABURIN in the last 72 hours. Results for orders placed or performed during the hospital encounter of 10/24/20  SARS CORONAVIRUS 2 (TAT 6-24 HRS) Nasopharyngeal Nasopharyngeal Swab     Status: None   Collection Time: 10/24/20 10:40 AM   Specimen: Nasopharyngeal Swab  Result Value Ref Range Status   SARS Coronavirus 2 NEGATIVE NEGATIVE Final    Comment: (NOTE) SARS-CoV-2 target nucleic acids are NOT DETECTED.  The SARS-CoV-2 RNA is generally detectable in upper and lower respiratory specimens during the acute phase of infection. Negative results do not preclude SARS-CoV-2 infection, do not rule out co-infections with other pathogens, and should not be used as the sole basis for treatment or other patient management  decisions. Negative results must be combined with clinical observations, patient history, and epidemiological information. The expected result is Negative.  Fact Sheet for Patients: HairSlick.no  Fact Sheet for Healthcare Providers: quierodirigir.com  This test is not yet approved or cleared by the [place] FDA and  has been authorized for detection and/or diagnosis of SARS-CoV-2 by FDA under an Emergency Use Authorization (EUA). This EUA will remain  in effect (meaning this test can be used) for the duration of the COVID-19 declaration under Se ction 564(b)(1) of the Act, 21 U.S.C. section 360bbb-3(b)(1), unless the authorization is terminated or revoked sooner.  Performed at [facility], [address] [place], [place] [address]     Studies/Results: No results found.  Assessment/Plan: POD#3 left simple nephrectomy  Discontinue foley catheter  Home Health PT  Continue current pain control regiment  Advance diet as tolerate  Ambulate in halls with assistance   LOS: 3 days   Wilkie Aye 10/31/2020, 12:56 PM

## 2020-11-01 LAB — GLUCOSE, CAPILLARY
Glucose-Capillary: 142 mg/dL — ABNORMAL HIGH (ref 70–99)
Glucose-Capillary: 155 mg/dL — ABNORMAL HIGH (ref 70–99)

## 2020-11-01 MED ORDER — ONDANSETRON HCL 4 MG PO TABS: 4.0000 mg | ORAL_TABLET | Freq: Every day | ORAL | 1 refills | Status: AC | PRN

## 2020-11-01 NOTE — TOC Progression Note (Signed)
Transition of Care [facility]) - Progression Note    Patient Details  Name: Tammie [name] MRN: [id number] Date of Birth: Mar 08, 1961  [facility] CM/SW Contact  Barry Brunner, LCSW Phone Number: 11/01/2020, 11:22 AM  Clinical Narrative:    CSW requested HH orders to fax to requested [facility] agency from MD. CSW received no response. TOC to follow.   Expected Discharge Plan: Home/Self Care Barriers to Discharge: Continued Medical Work up  Expected Discharge Plan and Services Expected Discharge Plan: Home/Self Care In-house Referral: Clinical Social Work   Post Acute Care Choice:  (outpatient therapy) Living arrangements for the past 2 months: Single Family Home Expected Discharge Date: 11/01/20                                     Social Determinants of Health (SDOH) Interventions    Readmission Risk Interventions Readmission Risk Prevention Plan 10/30/2020  Transportation Screening Complete  Home Care Screening Complete  Medication Review (RN CM) Complete

## 2020-11-01 NOTE — Progress Notes (Signed)
Nsg Discharge Note  Admit Date:  10/28/2020 Discharge date: 11/01/2020   Tammie [name] to be D/C'd Home per MD order.  AVS completed.  Copy for chart, and copy for patient signed, and dated. Patient/caregiver able to verbalize understanding. Picc line removed pressure dressing applied. Discharge paper work given and reviewed with patient. Patient stable upon discharge.   Discharge Medication: Allergies as of 11/01/2020      Reactions   Codeine Rash      Medication List    TAKE these medications   acetaminophen 500 MG tablet Commonly known as: TYLENOL Take 1,000 mg by mouth every 6 (six) hours as needed for mild pain.   blood glucose meter kit and supplies Kit Dispense based on patient and insurance preference. Use up to four times daily as directed. (FOR ICD-9 250.00, 250.01).   CINNAMON PO Take 1 tablet by mouth daily.   ferrous sulfate 325 (65 FE) MG tablet Take 1 tablet (325 mg total) by mouth daily with breakfast.   HumaLOG KwikPen 100 UNIT/ML KwikPen Generic drug: insulin lispro Inject 0-5 Units into the skin 3 (three) times daily as needed (high blood sugar). Per sliding scale   Lantus SoloStar 100 UNIT/ML Solostar Pen Generic drug: insulin glargine Inject 8 Units into the skin 2 (two) times daily.   NovoLIN R FlexPen ReliOn 100 UNIT/ML Sopn Generic drug: Insulin Regular Human Sugar 121-150--2 units; 151-200 3 units; 201-250 5 units; 251-300--8 units; 301-350--11 units; 351-400--15 units   ondansetron 4 MG tablet Commonly known as: Zofran Take 1 tablet (4 mg total) by mouth daily as needed for nausea or vomiting.   oxyCODONE-acetaminophen 5-325 MG tablet Commonly known as: Percocet Take 1 tablet by mouth every 4 (four) hours as needed.   Pen Needles 3/16" 31G X 5 MM Misc 1 each by Does not apply route 3 (three) times daily.   sulfamethoxazole-trimethoprim 800-160 MG tablet Commonly known as: BACTRIM DS Take 1 tablet by mouth 2 (two) times daily.             Discharge Care Instructions  (From admission, onward)         Start     Ordered   11/01/20 0000  Discharge wound care:       Comments: Pt can changed JP site dressing as needed   11/01/20 0908          Discharge Assessment: Vitals:   11/01/20 0630 11/01/20 0829  BP: 135/82 (!) 153/67  Pulse: 90 95  Resp: 16 18  Temp: (!) 97.1 F (36.2 C) 98.9 F (37.2 C)  SpO2: 100% 96%   Skin clean, dry and intact without evidence of skin break down, no evidence of skin tears noted. IV catheter discontinued intact. Site without signs and symptoms of complications - no redness or edema noted at insertion site, patient denies c/o pain - only slight tenderness at site.  Dressing with slight pressure applied.  D/c Instructions-Education: Discharge instructions given to patient/family with verbalized understanding. D/c education completed with patient/family including follow up instructions, medication list, d/c activities limitations if indicated, with other d/c instructions as indicated by MD - patient able to verbalize understanding, all questions fully answered. Patient instructed to return to ED, call 911, or call MD for any changes in condition.  Patient escorted via [place], and D/C home via private auto.  Zenaida Deed, RN 11/01/2020 12:43 PM

## 2020-11-01 NOTE — Discharge Summary (Addendum)
Date of admission: 10/28/2020  Date of discharge: 11/01/2020  Admission diagnosis: Emphysematous pyelonephritis  Discharge diagnosis: Emphysematous pyelonephritis  Secondary diagnoses:  Patient Active Problem List   Diagnosis Date Noted  . Pressure injury of skin 10/31/2020  . 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 Pyelonephritis   . Perinephric abscess 09/02/2020  . Sepsis secondary to UTI ([place]) 09/01/2020  . Type 2 diabetes mellitus with hyperglycemia ([place]) 09/01/2020  . Hypoalbuminemia 09/01/2020  . Nephrostomy tube displaced ([place]) 09/01/2020  . Elevated troponin   . Pleural effusion   . Emphysematous pyelonephritis of left kidney   . Ileus ([place]) 08/08/2020  . Severe sepsis ([place]) 08/07/2020  . Perirenal abscess   . E coli bacteremia   . Acute pyelonephritis   . Bibasilar crackles   . Diabetic ketoacidosis without coma associated with type 2 diabetes mellitus ([place])   . Delirium   . Hyperosmolar hyperglycemic state (HHS) ([place]) 08/01/2020  . Hyponatremia 08/01/2020  . Hypertension   . Acute renal failure ([place])   . Hypokalemia   . Severe protein-calorie malnutrition ([place])   . Class 1 obesity   . Leukocytosis   . Hypomagnesemia   . Hyperphosphatemia   . Abdominal distention   . Lung nodules   . Multifocal pneumonia 07/31/2020    Procedures performed: Procedure(s): LEFT OPEN NEPHRECTOMY- SIMPLE  History and Physical: For full details, please see admission history and physical. Briefly, Tammie [name] is a 60 y.o. year old patient with history of emphysematous pyelonephritis initially managed with percutaneous drains now postop day 4 s/p left simple nephrectomy.   Hospital Course: Patient tolerated the procedure well.  She was then transferred to the floor after an uneventful PACU stay.  Her hospital course was uncomplicated.  On POD#4 she had met discharge criteria: was eating a regular diet, was up and ambulating independently,  pain was well controlled, was voiding  without a catheter, and was ready to for discharge.  JP drained was removed prior to discharge.   Laboratory values:  Recent Labs    10/30/20 0813 10/31/20 0915  WBC 16.8* 11.7*  HGB 10.0* 9.6*  HCT 32.5* 33.0*   No results for input(s): NA, K, CL, CO2, GLUCOSE, BUN, CREATININE, CALCIUM in the last 72 hours. No results for input(s): LABPT, INR in the last 72 hours. No results for input(s): LABURIN in the last 72 hours. Results for orders placed or performed during the hospital encounter of 10/24/20  SARS CORONAVIRUS 2 (TAT 6-24 HRS) Nasopharyngeal Nasopharyngeal Swab     Status: None   Collection Time: 10/24/20 10:40 AM   Specimen: Nasopharyngeal Swab  Result Value Ref Range Status   SARS Coronavirus 2 NEGATIVE NEGATIVE Final    Comment: (NOTE) SARS-CoV-2 target nucleic acids are NOT DETECTED.  The SARS-CoV-2 RNA is generally detectable in upper and lower respiratory specimens during the acute phase of infection. Negative results do not preclude SARS-CoV-2 infection, do not rule out co-infections with other pathogens, and should not be used as the sole basis for treatment or other patient management decisions. Negative results must be combined with clinical observations, patient history, and epidemiological information. The expected result is Negative.  Fact Sheet for Patients: SugarRoll.be  Fact Sheet for Healthcare Providers: https://www.woods-mathews.com/  This test is not yet approved or cleared by the [place] FDA and  has been authorized for detection and/or diagnosis of SARS-CoV-2 by FDA under an Emergency Use Authorization (EUA). This EUA will remain  in effect (meaning this test can be used) for  the duration of the COVID-19 declaration under Se ction 564(b)(1) of the Act, 21 U.S.C. section 360bbb-3(b)(1), unless the authorization is terminated or revoked sooner.  Performed at [facility], [place] [address],  [place], [place] [address]     Disposition: Home  Discharge instruction: The patient was instructed to be ambulatory but told to refrain from heavy lifting, strenuous activity, or driving.   Discharge medications:  Allergies as of 11/01/2020      Reactions   Codeine Rash      Medication List    TAKE these medications   acetaminophen 500 MG tablet Commonly known as: TYLENOL Take 1,000 mg by mouth every 6 (six) hours as needed for mild pain.   blood glucose meter kit and supplies Kit Dispense based on patient and insurance preference. Use up to four times daily as directed. (FOR ICD-9 250.00, 250.01).   CINNAMON PO Take 1 tablet by mouth daily.   ferrous sulfate 325 (65 FE) MG tablet Take 1 tablet (325 mg total) by mouth daily with breakfast.   HumaLOG KwikPen 100 UNIT/ML KwikPen Generic drug: insulin lispro Inject 0-5 Units into the skin 3 (three) times daily as needed (high blood sugar). Per sliding scale   Lantus SoloStar 100 UNIT/ML Solostar Pen Generic drug: insulin glargine Inject 8 Units into the skin 2 (two) times daily.   NovoLIN R FlexPen ReliOn 100 UNIT/ML Sopn Generic drug: Insulin Regular Human Sugar 121-150--2 units; 151-200 3 units; 201-250 5 units; 251-300--8 units; 301-350--11 units; 351-400--15 units   oxyCODONE-acetaminophen 5-325 MG tablet Commonly known as: Percocet Take 1 tablet by mouth every 4 (four) hours as needed.   Pen Needles 3/16" 31G X 5 MM Misc 1 each by Does not apply route 3 (three) times daily.   sulfamethoxazole-trimethoprim 800-160 MG tablet Commonly known as: BACTRIM DS Take 1 tablet by mouth 2 (two) times daily.       Followup:   Follow-up Information    McKenzie, Candee Furbish, MD Follow up.   Specialty: Urology Why: 11/14/2020 at 10:45AM Contact information: [address]  [place] [address] [phone]

## 2020-11-05 ENCOUNTER — Telehealth: Payer: Self-pay

## 2020-11-05 NOTE — Telephone Encounter (Signed)
Patient calling regarding her leg swelling since the operation of her kidney surgery.  Please call patient back at [phone].  Thanks, Elyn Aquas

## 2020-11-06 NOTE — Telephone Encounter (Signed)
She needs to see her PCP or go to the ER to be evaluated for a DVT

## 2020-11-07 ENCOUNTER — Ambulatory Visit: Payer: BC Managed Care – PPO | Admitting: Internal Medicine

## 2020-11-07 ENCOUNTER — Encounter: Payer: Self-pay | Admitting: Internal Medicine

## 2020-11-07 ENCOUNTER — Other Ambulatory Visit: Payer: Self-pay

## 2020-11-07 VITALS — BP 153/83 | HR 76 | Temp 98.0°F | Ht 61.0 in | Wt 156.0 lb

## 2020-11-07 DIAGNOSIS — Z905 Acquired absence of kidney: Secondary | ICD-10-CM

## 2020-11-07 DIAGNOSIS — K651 Peritoneal abscess: Secondary | ICD-10-CM

## 2020-11-07 DIAGNOSIS — N118 Other chronic tubulo-interstitial nephritis: Secondary | ICD-10-CM | POA: Diagnosis not present

## 2020-11-07 MED ORDER — LEVOFLOXACIN 750 MG PO TABS: 750.0000 mg | ORAL_TABLET | Freq: Every day | ORAL | 0 refills | Status: AC

## 2020-11-07 NOTE — Patient Instructions (Signed)
You are recovering as expected  Will need another ct scan to make sure the abscesses are all gone  Will switch your bactrim to levofloxacin to take for the next 2 weeks    Wear compression stocking for the left leg. If swelling don't improve, let your pcp know. dvt doesn't necessarily comes with pain.  You can do a follow up visit in about 4-5 weeks with me

## 2020-11-07 NOTE — Progress Notes (Signed)
[facility]  Reason for Consult: Hospital follow up perinephric abscess  Referring Provider: Hospital follow up   HPI:    Tammie [name] is a 60 y.o. female with PMHx as below who presents to the clinic for follow up of perinephric abscess.    11/07/20 id f/u Patient underwent left nephrectomy on 3/22. Final dx xanthogranulomatous pyelo I reviewed operative note "We dissected over the 11th rib and then free the 11th rib from the intercostal muscles. We then entered the retroperitoneum. We resected the 11th rib and sent it for pathology. We first mobilized the inferior pole of the kindey with both blunt dissection and electocautery. We encountered the ureter which was clipped. We then used the Ligasure to dissect the dense adhesions on the posterior aspect of the kidney. The superior pole was then mobilized with blunt dissection and then the adrenal gland was freed from the kidney. The left adrenal vein was ligated with a metal clip. We then identified the renal arteyr and vein. The renal artery and vein wer ligated with multiple 0 silk ties. We then sharply dissected the renal vein and artery and the kidney was removed. It was then sent for pathology. We irrigated the retroperitoneal cavity and noted no residual bleeding." There was dense adhesions inferior pole of the kidney posterior to the psoas She was continued on iv abx and come see me today. She had this done at [facility] pen so no ID service there to help with abx decision No cultures were sent.  Her picc was removed as well and she was discharged on oral antibiotics Pain at focal rib resection is improving, getting around adequately. No fever, chill Does have some swelling LLE; no pain. It does come down a bit. 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 I checked [facility] website and there is no routine labs either     09/29/20 id f/u Patient had repeat ct of the abd/pelv on 2/02 ed visit after referred from clinic; no adjustment of drains done. She then had repeat ct by IR on 2/16 -- result below. Some improvement in perinephric abscess size but increasing pararenal abscess. Again one of the drain is not draining well, but the one into the perinephric abscess is not Her last cefazolin dose was on 2/18 No f/c/n/v/diarrhea/rash Feels well today  Of the 2 abscess drains, 1 is not draining anything, but the other is still draining average 50 cc pus a day. She has repeat ct scan abd on 3/16 that still visualized significant gas/inflammatoin of the left kidney. The fluid cavity where the drains were draining had collapsed but there is still drainage.  The nephrostomy tube is draining minimal clear urine. She is urinating well naturally     Background from dr [facility] clinic note on 09/10/20 ---------- She is a 60 yo woman with hx of DM, HTN, emphysematous pyelo, intra-abdominal abscess.  She has a complicated history with hospitalization 07/31/20-08/18/20 where she was treated for sepsis secondary to emphysematous pyelo, pelvic abscess, and perinephric abscess.  Cultures grew pan-sensitive E  coli.  During that hospitalization, she required placement of a left perc nephrostomy tube as well as drains to pelvic abscess and perirenal abscess. PICC was placed and she was discharged on cefazolin through 09/25/20.  During that admit also found to have left renal vein thrombus, bilateral pleural effusion and pulm abscess/nodules but was never symptomatic from respiratory standpoint.  She was then readmitted  1/23-1/28/22 after presenting with fevers, nausea, and vomiting.  Also with elevated WBC and decreased drain output.  Repeat CT done 09/01/20 with left perinephric abscess slightly decreased in size compared to prior.  Left pararenal abscess appeared relatively unchanged.  IR upsized her drains on 09/04/20.  Pleural effusions had resolved.  Subpleural nodule within the right lung base and atelectasis within the left lung base were unchanged.    She was treated with cefepime during admission and narrowed back to cefazolin at discharge.  She has repeat CT scan scheduled for tomorrow.   Urology planning for possible nephrectomy in the future as an outpatient.   OPAT labs from 09/08/20 have a WBC 8.7, Hgb 9.8, platelets 266.  BUN 15, Creatinine 0.8.  Today, she reports having increased pain in her back since drains were upsized on 09/04/20.  She also reports minimal drainage into catheter drain bags but is having copious drainage around the tubing.  She also endorses decreased PCN tube drainage today.  She has been having fevers and sweats.      Review of system: All other ROS was negative   Patient's Medications  New Prescriptions   No medications on file  Previous Medications   ACETAMINOPHEN (TYLENOL) 500 MG TABLET    Take 1,000 mg by mouth every 6 (six) hours as needed for mild pain.   BLOOD GLUCOSE METER KIT AND SUPPLIES KIT    Dispense based on patient and insurance preference. Use up to four times daily as directed. (FOR ICD-9 250.00, 250.01).   CINNAMON PO    Take 1 tablet by mouth daily.   FERROUS SULFATE 325 (65 FE) MG TABLET    Take 1 tablet (325 mg total) by mouth daily with breakfast.   HUMALOG KWIKPEN 100 UNIT/ML KWIKPEN    Inject 0-5 Units into the skin 3 (three) times daily as needed (high blood sugar). Per sliding scale   INSULIN GLARGINE (LANTUS SOLOSTAR) 100 UNIT/ML SOLOSTAR PEN    Inject 8 Units into the skin 2 (two) times daily.   INSULIN PEN NEEDLE (PEN NEEDLES 3/16") 31G X 5 MM  MISC    1 each by Does not apply route 3 (three) times daily.   INSULIN REGULAR HUMAN (NOVOLIN R FLEXPEN RELION) 100 UNIT/ML SOPN    Sugar 121-150--2 units; 151-200 3 units; 201-250 5 units; 251-300--8 units; 301-350--11 units; 351-400--15 units   ONDANSETRON (ZOFRAN) 4 MG TABLET    Take 1 tablet (4 mg total) by mouth daily as needed for nausea or vomiting.   OXYCODONE-ACETAMINOPHEN (PERCOCET) 5-325 MG TABLET    Take 1 tablet by mouth every 4 (four) hours as needed.   SULFAMETHOXAZOLE-TRIMETHOPRIM (BACTRIM DS) 800-160 MG TABLET    Take 1 tablet by mouth 2 (two) times daily.  Modified Medications   No medications on file  Discontinued Medications   No medications on file      Past Medical History:  Diagnosis Date  . Acute pyelonephritis   . Arthritis   . Class 1 obesity   . Diabetes mellitus without complication ([place])   . History of drainage of abscess   .  Hypertension   . Lung nodules   . Multifocal pneumonia 07/31/2020  . Perirenal abscess   . Pleural effusion     Social History   Tobacco Use  . Smoking status: Never Smoker  . Smokeless tobacco: Never Used  Vaping Use  . Vaping Use: Never used  Substance Use Topics  . Alcohol use: Not Currently  . Drug use: Not Currently    Family History  Problem Relation Age of Onset  . Huntington's disease Father   . Diabetes Sister     Allergies  Allergen Reactions  . Codeine Rash       OBJECTIVE:    Vitals:   11/07/20 1053  BP: (!) 153/83  Pulse: 76  Temp: 98 F (36.7 C)  Weight: 156 lb (70.8 kg)  Height: $Remove'5\' 1"'dtULLwb$  (1.549 m)     Body mass index is 29.48 kg/m.  Physical exam: General/constitutional: no distress, pleasant HEENT: Normocephalic, PER, Conj Clear, EOMI, Oropharynx clear Neck supple CV: rrr no mrg Lungs: clear to auscultation, normal respiratory effort Abd: Soft, Nontender Ext: no edema Skin: left lateroposterior mid trunk incision all healed; staples remains; mild tenderness but no  swelling/dehiscence/discharge/fluctuance. The previous drains also removed. Sites of the previous drains no purulence/fluctuance/erythema Neuro: nonfocal MSK: no peripheral joint swelling/tenderness/warmth; back spines nontender     Labs and Microbiology:  CBC Latest Ref Rng & Units 10/31/2020 10/30/2020 10/29/2020  WBC 4.0 - 10.5 K/uL 11.7(H) 16.8(H) 11.6(H)  Hemoglobin 12.0 - 15.0 g/dL 9.6(L) 10.0(L) 9.2(L)  Hematocrit 36.0 - 46.0 % 33.0(L) 32.5(L) 31.0(L)  Platelets 150 - 400 K/uL 398 373 333   CMP Latest Ref Rng & Units 10/29/2020 10/28/2020 10/24/2020  Glucose 70 - 99 mg/dL 165(H) 217(H) 104(H)  BUN 6 - 20 mg/dL $Remove'8 12 9  'WUVbTcI$ Creatinine 0.44 - 1.00 mg/dL 0.60 0.71 0.60  Sodium 135 - 145 mmol/L 135 138 137  Potassium 3.5 - 5.1 mmol/L 3.4(L) 3.8 3.6  Chloride 98 - 111 mmol/L 100 104 100  CO2 22 - 32 mmol/L $RemoveB'25 25 25  'iTJdckWB$ Calcium 8.9 - 10.3 mg/dL 8.4(L) 8.5(L) 9.7  Total Protein 6.5 - 8.1 g/dL - - -  Total Bilirubin 0.3 - 1.2 mg/dL - - -  Alkaline Phos 38 - 126 U/L - - -  AST 15 - 41 U/L - - -  ALT 0 - 44 U/L - - -     No results found for this or any previous visit (from the past 240 hour(s)).   Pathology: 10/28/2020 left kidney Final dx xanthogranulomatous pyelonephritis  Imaging: CT SCAN 09/01/20 IMPRESSION: Left perinephric abscess has slightly decreased in size when compared to prior examination. Percutaneous drainage catheter is seen within the collection, though has been partially withdrawn when compared to prior exam.  Left posterior pararenal U shaped abscess appears unchanged when compared to prior examination. Percutaneous drainage catheter appears appropriately position within the dependent component of the collection, unchanged from prior examination.  Left percutaneous nephrostomy in appropriate position. No hydronephrosis.  Resolved bilateral pleural effusions. Stable right subpleural pulmonary nodule, likely inflammatory in nature.   2/16 ct abd pelv with  contrast 1. Left nephrostomy catheter is in good position with mild pelvicaliectasis. 2. Minimal interval decrease in size of 7.7 cm posterior perinephric gas and fluid collection with pigtail drain catheter well position in the central aspect. 3. Slight enlargement of multiloculated left retroperitoneal iliopsoas collection, despite drain catheter manipulation.   3/16 ct abd/pelv with contrast 1. Persistent abnormal appearance of the left kidney with  diffusely delayed nephrogram and extensive perinephric edema. Findings remain consistent with diffuse renal edema versus pyelonephritis. 2. Slight interval improvement in the size of the subcapsular perinephric fluid and gas collection now measuring approximately 14 mL compared to 19 mL previously. 3. Unchanged appearance of collapsed/resolved abscess cavity in the left lower quadrant retroperitoneal space with percutaneous drainage catheter in place. Despite no visible residual fluid in this region, the drain continues to put out approximately 50 mL daily. It is possible that the more superior perinephric fluid collection is draining dependently with gravity toward this more inferior drainage catheter. 4. Additional ancillary findings as above without significant interval change.   ASSESSMENT & PLAN:    1. Perirenal abscess s/p IR drain placement x 2 2. E coli bacteremia 3. Pyelonephritis s/p PCN drain; xanthogranulomatous pyelo   Abx: 3/26-c bactrim  12/27-1/23; 1/28-2/18; 2/21-3/26 cefazolin  08/31/20-01/28 cefepime 12/23-27 ceftriaxone   60 yo female obese, dm2 admitted 12/23 for severe sepsis with aki, HHS, leukemoid reaction, found to have acute left emphysematous pyelonephritis with left renal vein thrombus, perirenal abscess, bilateral pleural effusion/pulm abscess-nodules, complicated by ecoli bacteremia. Patient discharged after drain placement into the pararenal abscess, however readmitted 1/23 for sepsis with  increased size perinephric abscess. She had subsequent IR visits who deemed the drains are in correct position, but unclear why one drain (into the perinephric abscess) is not draining well.  She also has a nephrostomy tube.  She is now at 8 weeks of antibiotics with minimal improvement in the abscess size. One of the drains is not draining well. Repeat ct last on 2/16 is concerning with increased pararenal abscess that appears multiloculated. The perinephric abscess has some improvement in size.    We reviewed the ct scan from December and 09/24/20... they look basically the same. There are 2 large abscesses -- one just above the left pelvis and one right below the left kidney.   Patient will need to be back on antibiotics given the abscess size. She'll also need to have discussion with urology/surgery. I worry she might need surgery for abscess clean out. The other more conservative route is another drain into the abscess due to loculation and see if more can be drained  She does mention that after 2/16 manual drainage of the abscess (drain #2 -- pararenal abscess) has been at least 1000 plus cc. So maybe repeat ct will see much improvement   10/27/20 assessment Resumed cefazolin 2/21 Pending nephrectomy with Dr Alyson Ingles tomorrow Would have ID team see her inpatient and make decision on antibiotics based on imaging/operative finding  4/01 assessment S/p left kidney resection 3/22, path consistent with Xanthogranulomatous pyelo. Op note doesn't indicate I&D of the previous abscess. Her picc/iv abx stopped and all iv drains removed. She is on bactrim now. Previous cx was ecoli  Will need to find out what the abscesses are doing now since kidney removed    -f/u urology as previously discussed (in a week) -stop bactrim; start levo -- 2 weeks given -crp, cbc, cmp to assess abd abscess and bactrim toxicity -ct abd pelvis in 1-2 weeks -f/u 5 weeks with me -I will see the ct result; if  concerning, will call patient and extend antibiotics, but if not, will see how she does in 4-5 weeks visit      [facility] Sun 11/07/2020, 10:59 AM  I spent more than 20 minute reviewing data/chart, and coordinating care and >50% direct face to  face time providing counseling/discussing diagnostics/treatment plan with patient

## 2020-11-08 LAB — COMPREHENSIVE METABOLIC PANEL
AG Ratio: 1.2 (calc) (ref 1.0–2.5)
ALT: 7 U/L (ref 6–29)
AST: 15 U/L (ref 10–35)
Albumin: 3.7 g/dL (ref 3.6–5.1)
Alkaline phosphatase (APISO): 133 U/L (ref 37–153)
BUN: 16 mg/dL (ref 7–25)
CO2: 31 mmol/L (ref 20–32)
Calcium: 9.7 mg/dL (ref 8.6–10.4)
Chloride: 97 mmol/L — ABNORMAL LOW (ref 98–110)
Creat: 0.78 mg/dL (ref 0.50–1.05)
Globulin: 3 g/dL (calc) (ref 1.9–3.7)
Glucose, Bld: 78 mg/dL (ref 65–99)
Potassium: 4.7 mmol/L (ref 3.5–5.3)
Sodium: 139 mmol/L (ref 135–146)
Total Bilirubin: 0.2 mg/dL (ref 0.2–1.2)
Total Protein: 6.7 g/dL (ref 6.1–8.1)

## 2020-11-08 LAB — C-REACTIVE PROTEIN: CRP: 54.8 mg/L — ABNORMAL HIGH (ref <8.0)

## 2020-11-08 LAB — CBC
HCT: 31 % — ABNORMAL LOW (ref 35.0–45.0)
Hemoglobin: 9.6 g/dL — ABNORMAL LOW (ref 11.7–15.5)
MCH: 25.1 pg — ABNORMAL LOW (ref 27.0–33.0)
MCHC: 31 g/dL — ABNORMAL LOW (ref 32.0–36.0)
MCV: 81.2 fL (ref 80.0–100.0)
MPV: 9.3 fL (ref 7.5–12.5)
Platelets: 609 10*3/uL — ABNORMAL HIGH (ref 140–400)
RBC: 3.82 10*6/uL (ref 3.80–5.10)
RDW: 16.4 % — ABNORMAL HIGH (ref 11.0–15.0)
WBC: 8.7 10*3/uL (ref 3.8–10.8)

## 2020-11-11 NOTE — Telephone Encounter (Signed)
Ok we will repeat a CT stone study at her next visit

## 2020-11-14 ENCOUNTER — Encounter: Payer: Self-pay | Admitting: Urology

## 2020-11-14 ENCOUNTER — Ambulatory Visit (INDEPENDENT_AMBULATORY_CARE_PROVIDER_SITE_OTHER): Payer: BC Managed Care – PPO | Admitting: Urology

## 2020-11-14 ENCOUNTER — Other Ambulatory Visit: Payer: Self-pay

## 2020-11-14 VITALS — BP 129/82 | HR 96 | Temp 98.3°F | Ht 61.0 in | Wt 145.0 lb

## 2020-11-14 DIAGNOSIS — N200 Calculus of kidney: Secondary | ICD-10-CM

## 2020-11-14 LAB — URINALYSIS, ROUTINE W REFLEX MICROSCOPIC
Bilirubin, UA: NEGATIVE
Glucose, UA: NEGATIVE
Ketones, UA: NEGATIVE
Nitrite, UA: NEGATIVE
Protein,UA: NEGATIVE
RBC, UA: NEGATIVE
Specific Gravity, UA: 1.015 (ref 1.005–1.030)
Urobilinogen, Ur: 0.2 mg/dL (ref 0.2–1.0)
pH, UA: 6.5 (ref 5.0–7.5)

## 2020-11-14 LAB — MICROSCOPIC EXAMINATION
RBC, Urine: NONE SEEN /hpf (ref 0–2)
Renal Epithel, UA: NONE SEEN /hpf

## 2020-11-14 NOTE — Progress Notes (Signed)
11/14/2020 11:11 AM   Tammie [name] Jan 02, 1961 [id number]  Referring provider: No referring provider defined for this encounter.  followup after left nephrectomy  HPI: Ms Tammie [name] is a 60yo here for followup after left simple nephrectomy. She has mild left flank pain at the incision. No drainage from incision. No fevers. No LUTS. No nausea/vomiting. She is scheduled for a CT abd/pelvis on 11/18/2020. No other complaints today   PMH: Past Medical History:  Diagnosis Date  . Acute pyelonephritis   . Arthritis   . Class 1 obesity   . Diabetes mellitus without complication ([place])   . History of drainage of abscess   . Hypertension   . Lung nodules   . Multifocal pneumonia 07/31/2020  . Perirenal abscess   . Pleural effusion     Surgical History: Past Surgical History:  Procedure Laterality Date  . IR CATHETER TUBE CHANGE  09/04/2020  . IR CATHETER TUBE CHANGE  09/04/2020  . IR RADIOLOGIST EVAL & MGMT  09/24/2020  . IR RADIOLOGIST EVAL & MGMT  10/08/2020  . IR RADIOLOGIST EVAL & MGMT  10/22/2020  . NEPHRECTOMY Left 10/28/2020   Procedure: LEFT OPEN NEPHRECTOMY- SIMPLE;  Surgeon: Tammie Gustin, MD;  Location: [facility];  Service: Urology;  Laterality: Left;  . TUBAL LIGATION      Home Medications:  Allergies as of 11/14/2020      Reactions   Codeine Rash      Medication List       Accurate as of November 14, 2020 11:11 AM. If you have any questions, ask your nurse or doctor.        acetaminophen 500 MG tablet Commonly known as: TYLENOL Take 1,000 mg by mouth every 6 (six) hours as needed for mild pain.   blood glucose meter kit and supplies Kit Dispense based on patient and insurance preference. Use up to four times daily as directed. (FOR ICD-9 250.00, 250.01).   CINNAMON PO Take 1 tablet by mouth daily.   ferrous sulfate 325 (65 FE) MG tablet Take 1 tablet (325 mg total) by mouth daily with breakfast.   HumaLOG KwikPen 100 UNIT/ML KwikPen Generic drug: insulin  lispro Inject 0-5 Units into the skin 3 (three) times daily as needed (high blood sugar). Per sliding scale   Lantus SoloStar 100 UNIT/ML Solostar Pen Generic drug: insulin glargine Inject 8 Units into the skin 2 (two) times daily.   levofloxacin 750 MG tablet Commonly known as: LEVAQUIN Take 1 tablet (750 mg total) by mouth daily.   metFORMIN 500 MG 24 hr tablet Commonly known as: GLUCOPHAGE-XR Take by mouth.   NovoLIN R FlexPen ReliOn 100 UNIT/ML Sopn Generic drug: Insulin Regular Human Sugar 121-150--2 units; 151-200 3 units; 201-250 5 units; 251-300--8 units; 301-350--11 units; 351-400--15 units   ondansetron 4 MG tablet Commonly known as: Zofran Take 1 tablet (4 mg total) by mouth daily as needed for nausea or vomiting.   oxyCODONE-acetaminophen 5-325 MG tablet Commonly known as: Percocet Take 1 tablet by mouth every 4 (four) hours as needed.   Pen Needles 3/16" 31G X 5 MM Misc 1 each by Does not apply route 3 (three) times daily.   sulfamethoxazole-trimethoprim 800-160 MG tablet Commonly known as: BACTRIM DS Take 1 tablet by mouth 2 (two) times daily.       Allergies:  Allergies  Allergen Reactions  . Codeine Rash    Family History: Family History  Problem Relation Age of Onset  . Huntington's disease Father   .  Diabetes Sister     Social History:  reports that she has never smoked. She has never used smokeless tobacco. She reports previous alcohol use. She reports previous drug use.  ROS: All other review of systems were reviewed and are negative except what is noted above in HPI  Physical Exam: BP 129/82   Pulse 96   Temp 98.3 F (36.8 C)   Ht $R'5\' 1"'iz$  (1.549 m)   Wt 145 lb (65.8 kg)   LMP  (LMP Unknown)   BMI 27.40 kg/m   Constitutional:  Alert and oriented, No acute distress. HEENT: [place] AT, moist mucus membranes.  Trachea midline, no masses. Cardiovascular: No clubbing, cyanosis, or edema. Respiratory: Normal respiratory effort, no increased  work of breathing. GI: Abdomen is soft, nontender, nondistended, no abdominal masses GU: No CVA tenderness. Healing left flank incision Lymph: No cervical or inguinal lymphadenopathy. Skin: No rashes, bruises or suspicious lesions. Neurologic: Grossly intact, no focal deficits, moving all 4 extremities. Psychiatric: Normal mood and affect.  Laboratory Data: Lab Results  Component Value Date   WBC 8.7 11/07/2020   HGB 9.6 (L) 11/07/2020   HCT 31.0 (L) 11/07/2020   MCV 81.2 11/07/2020   PLT 609 (H) 11/07/2020    Lab Results  Component Value Date   CREATININE 0.78 11/07/2020    No results found for: PSA  No results found for: TESTOSTERONE  Lab Results  Component Value Date   HGBA1C 6.0 (H) 10/28/2020    Urinalysis    Component Value Date/Time   COLORURINE AMBER (A) 09/01/2020 0005   APPEARANCEUR Hazy (A) 10/24/2020 0855   LABSPEC 1.006 09/01/2020 0005   PHURINE 6.0 09/01/2020 0005   GLUCOSEU Negative 10/24/2020 0855   HGBUR MODERATE (A) 09/01/2020 0005   BILIRUBINUR Negative 10/24/2020 0855   KETONESUR NEGATIVE 09/01/2020 0005   PROTEINUR 1+ (A) 10/24/2020 0855   PROTEINUR 100 (A) 09/01/2020 0005   NITRITE Negative 10/24/2020 0855   NITRITE NEGATIVE 09/01/2020 0005   LEUKOCYTESUR 3+ (A) 10/24/2020 0855   LEUKOCYTESUR MODERATE (A) 09/01/2020 0005    Lab Results  Component Value Date   LABMICR See below: 10/24/2020   WBCUA >30 (A) 10/24/2020   LABEPIT >10 (A) 10/24/2020   MUCUS Present 10/24/2020   BACTERIA Moderate (A) 10/24/2020    Pertinent Imaging:  No results found for this or any previous visit.  No results found for this or any previous visit.  No results found for this or any previous visit.  No results found for this or any previous visit.  No results found for this or any previous visit.  No results found for this or any previous visit.  No results found for this or any previous visit.  Results for orders placed during the hospital  encounter of 07/31/20  CT RENAL STONE STUDY  Narrative CLINICAL DATA:  60 year old female with pyelonephritis and renal abscess. Follow-up CT.  EXAM: CT ABDOMEN AND PELVIS WITHOUT CONTRAST  TECHNIQUE: Multidetector CT imaging of the abdomen and pelvis was performed following the standard protocol without IV contrast.  COMPARISON:  CT abdomen pelvis dated 08/13/2020.  FINDINGS: Evaluation of this exam is limited in the absence of intravenous contrast.  Lower chest: Partially visualized small bilateral pleural effusions and consolidative changes of the lung bases as seen previously. Several pulmonary nodules measure 7 mm in the right middle lobe.  No intra-abdominal free air or free fluid.  Hepatobiliary: No focal liver abnormality is seen. No gallstones, gallbladder wall thickening, or biliary dilatation.  Pancreas: Unremarkable. No pancreatic ductal dilatation or surrounding inflammatory changes.  Spleen: Normal in size without focal abnormality.  Adrenals/Urinary Tract: The adrenal glands unremarkable. Left percutaneous nephrostomy with pigtail tip in the left renal pelvis in similar position. There has been interval placement a percutaneous drainage catheter with pigtail tip in the posterior perinephric collection. Slight interval decrease in the size of the perinephric collection compared to prior CT. There is no hydronephrosis or nephrolithiasis on either side. The right kidney is unremarkable. The urinary bladder is decompressed around a Foley catheter.  Stomach/Bowel: There is no bowel obstruction or active inflammation. The appendix is not visualized with certainty. No inflammatory changes identified in the right lower quadrant.  Vascular/Lymphatic: Mild aortoiliac atherosclerotic disease. The IVC is unremarkable. No portal venous gas. There is no adenopathy.  Reproductive: The uterus and ovaries are grossly unremarkable.  Other: Mild diffuse subcutaneous  edema. There has been interval placement of a percutaneous drainage catheter with tip in the fluid collection along the anterior aspect of the left psoas muscle. The collection has decreased in size now measuring approximately 4.2 x 8.0 cm (previously 4.4 x 8.7 cm).  Musculoskeletal: No acute or significant osseous findings.  IMPRESSION: 1. Interval placement of a percutaneous drainage catheter with tip in the posterior perinephric collection. Slight interval decrease in the size of the fluid collection compared to prior CT. 2. Decrease in the size of the collection anterior to the left psoas muscle status post percutaneous drainage catheter placement. Near 3. No hydronephrosis or nephrolithiasis. 4. No bowel obstruction. 5. Several pulmonary nodules measure 7 mm in the right middle lobe. 6. Aortic Atherosclerosis (ICD10-I70.0).   Electronically Signed By: Anner Crete M.D. On: 08/16/2020 20:27   Assessment & Plan:    1. Left emphysematous pyelonephritis -half of the staples removed today. RTC 2 weeks to remove remainder of staples. We will await CT scan results.  - Urinalysis, Routine w reflex microscopic   No follow-ups on file.  Nicolette Bang, MD  [facility] [place]

## 2020-11-14 NOTE — Patient Instructions (Signed)
Open Nephrectomy, Care After This sheet gives you information about how to care for yourself after your procedure. Your health care provider may also give you more specific instructions. If you have problems or questions, contact your health care provider. What can I expect after the procedure? After the procedure, it is common to have:  Pain in your side and abdomen. Pain may be worse when you cough or breathe deeply.  Sore and weak muscles. It may be difficult to get up if you are sitting or lying down.  Trouble finding a comfortable sleeping position.  Numbness in the area around your incision. Follow these instructions at home: Medicines  Take over-the-counter and prescription medicines only as told by your health care provider.  Avoid using NSAIDs regularly over a long period of time. These include aspirin and ibuprofen. Taking NSAIDs often can eventually damage your remaining kidney.  Ask your health care provider if the medicine prescribed to you: ? Requires you to avoid driving or using machinery. ? Can cause constipation. You may need to take these actions to prevent or treat constipation:  Drink enough fluid to keep your urine pale yellow.  Take over-the-counter or prescription medicines.  Eat foods that are high in fiber, such as beans, whole grains, and fresh fruits and vegetables.  Limit foods that are high in fat and processed sugars, such as fried or sweet foods.   Incision care and drain care  Follow instructions from your health care provider about how to take care of your incision. Make sure you: ? Wash your hands with soap and water for at least 20 seconds before and after you change your bandage (dressing). If soap and water are not available, use hand sanitizer. ? Change your dressing as told by your health care provider. ? Leave stitches (sutures), staples, skin glue, or adhesive strips in place. These skin closures may need to stay in place for 2 weeks or  longer. If adhesive strip edges start to loosen and curl up, you may trim the loose edges. Do not remove adhesive strips completely unless your health care provider tells you to do that.  Check your incision area every day for signs of infection. Check for: ? Redness, swelling, or more pain. ? Fluid or blood. ? Warmth. ? Pus or a bad smell.  If you have a drain in place, follow your health care provider's instructions for drain care. This may include emptying the drain and keeping track of the amount of drainage.   Bathing Do not take baths, swim, or use a hot tub until your health care provider approves. Ask your health care provider if you may take showers. You may only be allowed to take sponge baths. Activity  Rest as told by your health care provider.  Avoid sitting for a long time without moving. Get up to take short walks every 1-2 hours. This is important to improve blood flow and breathing. Ask for help if you feel weak or unsteady.  Avoid activity that requires a lot of energy, such as running. Ask your health care provider what activities are safe for you during recovery and what activities you need to avoid.  Do not lift anything that is heavier than 10 lb (4.5 kg), or the limit that you are told, until your health care provider says that it is safe.  Do not play contact sports. Doing so may damage your remaining kidney.  Do not drive until your health care provider approves.   Catheter  care If you have a urinary catheter, care for it as told by your health care provider. You may be told to:  Wash your hands with soap and water for at least 20 seconds before and after touching the catheter, tubing, or drainage bag. If soap and water are not available, use hand sanitizer.  Empty the drainage bag every 2-4 hours, or more often if needed. Do not let the bag get completely full.  Monitor the amount and color of your urine as told by your health care provider.  Keep the area  around the catheter clean and dry.  Make sure to keep the catheter secured to avoid pulling and accidental removal.  Always keep the urine collection bag below the level of your bladder.  Check the catheter tubing regularly to make sure there are no kinks or blockages.  Make sure that the catheter is not placed under water. General instructions  Do deep breathing exercises as told by your health care provider. These help to prevent lung infection (pneumonia).  If you need to cough, try holding a pillow against your abdomen. This may help with discomfort.  If lying down flat to sleep is uncomfortable, try sleeping in a chair until your incision heals more.  Wear compression stockings as told by your health care provider. These stockings help to prevent blood clots and reduce swelling in your legs.  Keep all follow-up visits. This is important. Contact a health care provider if:  You have a fever or chills.  You have pain that does not get better with medicine.  You have redness, swelling, or more pain around your incision.  You have a cough.  Your incision is warm to the touch.  You have blood in your urine.  You have not had a bowel movement in 3 days.  You have diarrhea. Get help right away if:  You have trouble breathing or feel short of breath.  You have chest pain.  You have severe pain.  There is fluid or blood coming from your incision.  There is pus or a bad smell coming from your incision.  You cannot urinate.  You have warmth, redness, and tenderness in your leg. These symptoms may represent a serious problem that is an emergency. Do not wait to see if the symptoms will go away. Get medical help right away. Call your local emergency services (911 in the [place] Do not drive yourself to the hospital. Summary  After the procedure, it is common to have pain in your side or abdomen.  Take over-the-counter and prescription medicines only as told by your  health care provider.  Check your incision every day. Contact your health care provider if you notice signs of infection.  Follow instructions from your health care provider about exercise, driving, and general activities. Ask your health care provider what activities are safe for you. This information is not intended to replace advice given to you by your health care provider. Make sure you discuss any questions you have with your health care provider. Document Revised: 11/19/2019 Document Reviewed: 11/19/2019 Elsevier Patient Education  2021 [employer].

## 2020-11-14 NOTE — Progress Notes (Signed)
Urological Symptom Review  Patient is experiencing the following symptoms: none   Review of Systems  Gastrointestinal (upper)  : Negative for upper GI symptoms  Gastrointestinal (lower) : Negative for lower GI symptoms  Constitutional : Negative for symptoms  Skin: Negative for skin symptoms  Eyes: Negative for eye symptoms  Ear/Nose/Throat : Negative for Ear/Nose/Throat symptoms  Hematologic/Lymphatic: Negative for Hematologic/Lymphatic symptoms  Cardiovascular : Negative for cardiovascular symptoms  Respiratory : Negative for respiratory symptoms  Endocrine: Negative for endocrine symptoms  Musculoskeletal: Negative for musculoskeletal symptoms  Neurological: Negative for neurological symptoms  Psychologic: negative  

## 2020-11-14 NOTE — Progress Notes (Signed)
9 staples removed as per Dr. Ronne Binning- every other staple removed without difficulty. Steri strips applied after staple removed. Patient tolerated with no issues.

## 2020-11-17 ENCOUNTER — Telehealth: Payer: Self-pay

## 2020-11-17 ENCOUNTER — Other Ambulatory Visit: Payer: Self-pay | Admitting: Urology

## 2020-11-17 MED ORDER — OXYCODONE-ACETAMINOPHEN 5-325 MG PO TABS: 1.0000 | ORAL_TABLET | ORAL | 0 refills | Status: AC | PRN

## 2020-11-17 NOTE — Telephone Encounter (Signed)
done

## 2020-11-18 ENCOUNTER — Encounter (HOSPITAL_COMMUNITY): Payer: Self-pay

## 2020-11-18 ENCOUNTER — Other Ambulatory Visit: Payer: Self-pay

## 2020-11-18 ENCOUNTER — Ambulatory Visit (HOSPITAL_COMMUNITY)
Admission: RE | Admit: 2020-11-18 | Discharge: 2020-11-18 | Disposition: A | Discharge status: Home / Self Care | Payer: BC Managed Care – PPO | Source: Ambulatory Visit | Attending: Internal Medicine | Admitting: Internal Medicine

## 2020-11-18 DIAGNOSIS — R188 Other ascites: Secondary | ICD-10-CM | POA: Diagnosis not present

## 2020-11-18 DIAGNOSIS — N118 Other chronic tubulo-interstitial nephritis: Secondary | ICD-10-CM | POA: Insufficient documentation

## 2020-11-18 DIAGNOSIS — K651 Peritoneal abscess: Secondary | ICD-10-CM | POA: Insufficient documentation

## 2020-11-18 DIAGNOSIS — Z87448 Personal history of other diseases of urinary system: Secondary | ICD-10-CM | POA: Diagnosis not present

## 2020-11-18 DIAGNOSIS — Z905 Acquired absence of kidney: Secondary | ICD-10-CM | POA: Diagnosis not present

## 2020-11-18 DIAGNOSIS — K6389 Other specified diseases of intestine: Secondary | ICD-10-CM | POA: Diagnosis not present

## 2020-11-18 MED ORDER — IOHEXOL 300 MG/ML  SOLN
100.0000 mL | Freq: Once | INTRAMUSCULAR | Status: AC | PRN
  Administered 2020-11-18: 100 mL via INTRAVENOUS

## 2020-11-18 NOTE — Telephone Encounter (Signed)
Patient called and made aware.

## 2020-12-03 ENCOUNTER — Ambulatory Visit (INDEPENDENT_AMBULATORY_CARE_PROVIDER_SITE_OTHER): Payer: BC Managed Care – PPO

## 2020-12-03 ENCOUNTER — Other Ambulatory Visit: Payer: Self-pay

## 2020-12-03 DIAGNOSIS — N151 Renal and perinephric abscess: Secondary | ICD-10-CM

## 2020-12-03 DIAGNOSIS — Z4889 Encounter for other specified surgical aftercare: Secondary | ICD-10-CM

## 2020-12-03 NOTE — Progress Notes (Signed)
Patient came in today for remaining staple removal. 11 staples removed from left side without difficulty.   Steri-strips applied to area. Site clean and dry.   Instructed patient Dr. Ronne Binning would call patient later today to discuss recent CT.  Pt voiced understanding.

## 2020-12-17 ENCOUNTER — Encounter: Payer: Self-pay | Admitting: Internal Medicine

## 2020-12-17 ENCOUNTER — Ambulatory Visit (INDEPENDENT_AMBULATORY_CARE_PROVIDER_SITE_OTHER): Payer: BC Managed Care – PPO | Admitting: Internal Medicine

## 2020-12-17 ENCOUNTER — Other Ambulatory Visit: Payer: Self-pay

## 2020-12-17 VITALS — BP 112/71 | HR 93 | Temp 97.9°F | Ht 61.0 in | Wt 146.0 lb

## 2020-12-17 DIAGNOSIS — K651 Peritoneal abscess: Secondary | ICD-10-CM

## 2020-12-17 NOTE — Progress Notes (Signed)
[facility]  Reason for Consult: Hospital follow up perinephric abscess  Referring Provider: Hospital follow up   HPI:    Tammie [name] is a 60 y.o. female with PMHx as below who presents to the clinic for follow up of perinephric abscess.   12/17/20 Patient is off levofloxacin now since mid 11/2020 She had a repeat ct that showed slightly increased retroperitoneal fluid collection where the left kidney was removed Despite this she is feeling well She has mild discomfort left side of abd/back but intermittent and not persistent/progressive She is eating well No n/v No fever/chill No nightsweat   11/07/20 id f/u Patient underwent left nephrectomy on 3/22. Final dx xanthogranulomatous pyelo I reviewed operative note "We dissected over the 11th rib and then free the 11th rib from the intercostal muscles. We then entered the retroperitoneum. We resected the 11th rib and sent it for pathology. We first mobilized the inferior pole of the kindey with both blunt dissection and electocautery. We encountered the ureter which was clipped. We then used the Ligasure to dissect the dense adhesions on the posterior aspect of the kidney. The superior pole was then mobilized with blunt dissection and then the adrenal gland was freed from the kidney. The left adrenal vein was ligated with a metal clip. We then identified the renal arteyr and vein. The renal artery and vein wer ligated with multiple 0 silk ties. We then sharply dissected the renal vein and artery and the kidney was removed. It was then sent for pathology. We irrigated the retroperitoneal cavity and noted no residual bleeding." There was dense adhesions inferior pole of the kidney posterior to the psoas She was continued on iv abx and come see me today. She had this done at [facility] so no ID service there to help with abx decision No cultures were sent.  Her picc was removed as well and she was discharged  on oral antibiotics Pain at focal rib resection is improving, getting around adequately. No fever, chill Does have some swelling LLE; no pain. It does come down a bit. 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 I checked [facility] website and there is no routine labs either     09/29/20 id f/u Patient had repeat ct of the abd/pelv on 2/02 ed visit after referred from clinic; no adjustment of drains done. She then had repeat ct by IR on 2/16 -- result below. Some improvement in perinephric abscess size but increasing pararenal abscess. Again one of the drain is not draining well, but the one into the perinephric abscess is not Her last cefazolin dose was on 2/18 No f/c/n/v/diarrhea/rash Feels well today  Of the 2 abscess drains, 1 is not draining anything, but the other is still draining average 50 cc pus a day. She has repeat ct scan abd on 3/16 that still visualized significant gas/inflammatoin of the left kidney. The fluid cavity where the drains were draining had collapsed but there is still drainage.  The nephrostomy tube is draining minimal clear urine. She is urinating well naturally  Background from dr [facility] clinic note on 09/10/20 ---------- She is a 60 yo woman with hx of DM, HTN, emphysematous pyelo, intra-abdominal abscess.  She has a complicated history with hospitalization 07/31/20-08/18/20 where she was treated for sepsis secondary to emphysematous pyelo, pelvic abscess, and perinephric abscess.  Cultures grew pan-sensitive E coli.  During that hospitalization, she required placement  of a left perc nephrostomy tube as well as drains to pelvic abscess and perirenal abscess. PICC was placed and she was discharged on cefazolin through 09/25/20.  During that admit also found to have left renal vein thrombus, bilateral pleural effusion and pulm abscess/nodules but was never symptomatic from respiratory standpoint.  She was then readmitted 1/23-1/28/22 after presenting with fevers, nausea, and vomiting.  Also with elevated WBC and decreased drain output.  Repeat CT done 09/01/20 with left perinephric abscess slightly decreased in size compared to prior.  Left pararenal abscess appeared relatively unchanged.  IR upsized her drains on 09/04/20.  Pleural effusions had resolved.  Subpleural nodule within the right lung base and atelectasis within the left lung base were unchanged.    She was treated with cefepime during admission and narrowed back to cefazolin at discharge.  She has repeat CT scan scheduled for tomorrow.   Urology planning for possible nephrectomy in the future as an outpatient.   OPAT labs from 09/08/20 have a WBC 8.7, Hgb 9.8, platelets 266.  BUN 15, Creatinine 0.8.  Today, she reports having increased pain in her back since drains were upsized on 09/04/20.  She also reports minimal drainage into catheter drain bags but is having copious drainage around the tubing.  She also endorses decreased PCN tube drainage today.  She has been having fevers and sweats.      Review of system: All other ROS was negative   Patient's Medications  New Prescriptions   No medications on file  Previous Medications   ACETAMINOPHEN (TYLENOL) 500 MG TABLET    Take 1,000 mg by mouth every 6 (six) hours as needed for mild pain.   BLOOD GLUCOSE METER KIT AND SUPPLIES KIT    Dispense based on patient and insurance preference. Use up to four times daily as directed. (FOR ICD-9 250.00, 250.01).   CINNAMON PO    Take 1 tablet by mouth daily.   DAPAGLIFLOZIN PROPANEDIOL (FARXIGA) 10 MG TABS TABLET     Take by mouth daily.   FERROUS SULFATE 325 (65 FE) MG TABLET    Take 1 tablet (325 mg total) by mouth daily with breakfast.   HUMALOG KWIKPEN 100 UNIT/ML KWIKPEN    Inject 0-5 Units into the skin 3 (three) times daily as needed (high blood sugar). Per sliding scale   INSULIN GLARGINE (LANTUS SOLOSTAR) 100 UNIT/ML SOLOSTAR PEN    Inject 8 Units into the skin 2 (two) times daily.   INSULIN PEN NEEDLE (PEN NEEDLES 3/16") 31G X 5 MM MISC    1 each by Does not apply route 3 (three) times daily.   INSULIN REGULAR HUMAN (NOVOLIN R FLEXPEN RELION) 100 UNIT/ML SOPN    Sugar 121-150--2 units; 151-200 3 units; 201-250 5 units; 251-300--8 units; 301-350--11 units; 351-400--15 units   LEVOFLOXACIN (LEVAQUIN) 750 MG TABLET    Take 1 tablet (750 mg total) by mouth daily.   METFORMIN (GLUCOPHAGE-XR) 500 MG 24 HR TABLET    Take by mouth.   ONDANSETRON (ZOFRAN) 4 MG TABLET    Take 1 tablet (4 mg total) by mouth daily as needed for nausea or  vomiting.   OXYCODONE-ACETAMINOPHEN (PERCOCET) 5-325 MG TABLET    Take 1 tablet by mouth every 4 (four) hours as needed.   SULFAMETHOXAZOLE-TRIMETHOPRIM (BACTRIM DS) 800-160 MG TABLET    Take 1 tablet by mouth 2 (two) times daily.  Modified Medications   No medications on file  Discontinued Medications   No medications on file      Past Medical History:  Diagnosis Date  . Acute pyelonephritis   . Arthritis   . Class 1 obesity   . Diabetes mellitus without complication ([place])   . History of drainage of abscess   . Hypertension   . Lung nodules   . Multifocal pneumonia 07/31/2020  . Perirenal abscess   . Pleural effusion     Social History   Tobacco Use  . Smoking status: Never Smoker  . Smokeless tobacco: Never Used  Vaping Use  . Vaping Use: Never used  Substance Use Topics  . Alcohol use: Not Currently  . Drug use: Not Currently    Family History  Problem Relation Age of Onset  . Huntington's disease Father   . Diabetes Sister     Allergies   Allergen Reactions  . Codeine Rash       OBJECTIVE:    Vitals:   12/17/20 1409  BP: 112/71  Pulse: 93  Temp: 97.9 F (36.6 C)  TempSrc: Oral  SpO2: 99%  Weight: 146 lb (66.2 kg)  Height: _0  (1.549 m)     Body mass index is 27.59 kg/m.  Physical exam: General/constitutional: no distress, pleasant HEENT: Normocephalic, PER, Conj Clear, EOMI, Oropharynx clear Neck supple CV: rrr no mrg Lungs: clear to auscultation, normal respiratory effort Abd: Soft, Nontender Ext: no edema Skin: No Rash Neuro: nonfocal MSK: no peripheral joint swelling/tenderness/warmth; back spines nontender  The scar on the left flank healed Gu: no tenderness percussion left flank   Labs and Microbiology:  CBC Latest Ref Rng & Units 11/07/2020 10/31/2020 10/30/2020  WBC 3.8 - 10.8 Thousand/uL 8.7 11.7(H) 16.8(H)  Hemoglobin 11.7 - 15.5 g/dL 9.6(L) 9.6(L) 10.0(L)  Hematocrit 35.0 - 45.0 % 31.0(L) 33.0(L) 32.5(L)  Platelets 140 - 400 Thousand/uL 609(H) 398 373   CMP Latest Ref Rng & Units 11/07/2020 10/29/2020 10/28/2020  Glucose 65 - 99 mg/dL 78 165(H) 217(H)  BUN 7 - 25 mg/dL _1 Creatinine 0.50 - 1.05 mg/dL 0.78 0.60 0.71  Sodium 135 - 146 mmol/L 139 135 138  Potassium 3.5 - 5.3 mmol/L 4.7 3.4(L) 3.8  Chloride 98 - 110 mmol/L 97(L) 100 104  CO2 20 - 32 mmol/L _2 Calcium 8.6 - 10.4 mg/dL 9.7 8.4(L) 8.5(L)  Total Protein 6.1 - 8.1 g/dL 6.7 - -  Total Bilirubin 0.2 - 1.2 mg/dL 0.2 - -  Alkaline Phos 38 - 126 U/L - - -  AST 10 - 35 U/L 15 - -  ALT 6 - 29 U/L 7 - -     No results found for this or any previous visit (from the past 240 hour(s)).   Pathology: 10/28/2020 left kidney Final dx xanthogranulomatous pyelonephritis  Imaging: CT SCAN 09/01/20 IMPRESSION: Left perinephric abscess has slightly decreased in size when compared to prior examination. Percutaneous drainage catheter is seen within the collection, though has been partially withdrawn when compared to prior  exam.  Left posterior pararenal U shaped abscess appears unchanged when compared to prior examination. Percutaneous drainage catheter appears appropriately position within the dependent component of the collection, unchanged from prior  examination.  Left percutaneous nephrostomy in appropriate position. No hydronephrosis.  Resolved bilateral pleural effusions. Stable right subpleural pulmonary nodule, likely inflammatory in nature.   2/16 ct abd pelv with contrast 1. Left nephrostomy catheter is in good position with mild pelvicaliectasis. 2. Minimal interval decrease in size of 7.7 cm posterior perinephric gas and fluid collection with pigtail drain catheter well position in the central aspect. 3. Slight enlargement of multiloculated left retroperitoneal iliopsoas collection, despite drain catheter manipulation.   3/16 ct abd/pelv with contrast 1. Persistent abnormal appearance of the left kidney with diffusely delayed nephrogram and extensive perinephric edema. Findings remain consistent with diffuse renal edema versus pyelonephritis. 2. Slight interval improvement in the size of the subcapsular perinephric fluid and gas collection now measuring approximately 14 mL compared to 19 mL previously. 3. Unchanged appearance of collapsed/resolved abscess cavity in the left lower quadrant retroperitoneal space with percutaneous drainage catheter in place. Despite no visible residual fluid in this region, the drain continues to put out approximately 50 mL daily. It is possible that the more superior perinephric fluid collection is draining dependently with gravity toward this more inferior drainage catheter. 4. Additional ancillary findings as above without significant interval change.  4/12 abd pelv ct with contrast 1. Status post LEFT nephrectomy. Within the LEFT nephrectomy bed, there is an air and fluid collection which measures 5.2 x 3.2 by 5.8 cm. This appears to  correspond to the perinephric collection seen pre surgically and is mildly increased in size status post drain removal. This is nonspecific with differential considerations including postsurgical seroma versus abscess. Recommend correlation with clinical presentation. 2. Mild bowel wall thickening of the descending colon, most likely reactive in etiology.    ASSESSMENT & PLAN:    1. Perirenal abscess s/p IR drain placement x 2 2. E coli bacteremia 3. Pyelonephritis s/p PCN drain; xanthogranulomatous pyelo   Abx: 3/26-c bactrim  12/27-1/23; 1/28-2/18; 2/21-3/26 cefazolin  08/31/20-01/28 cefepime 12/23-27 ceftriaxone   60 yo female obese, dm2 admitted 12/23 for severe sepsis with aki, HHS, leukemoid reaction, found to have acute left emphysematous pyelonephritis with left renal vein thrombus, perirenal abscess, bilateral pleural effusion/pulm abscess-nodules, complicated by ecoli bacteremia. Patient discharged after drain placement into the pararenal abscess, however readmitted 1/23 for sepsis with increased size perinephric abscess. She had subsequent IR visits who deemed the drains are in correct position, but unclear why one drain (into the perinephric abscess) is not draining well.  She also has a nephrostomy tube.  She is now at 8 weeks of antibiotics with minimal improvement in the abscess size. One of the drains is not draining well. Repeat ct last on 2/16 is concerning with increased pararenal abscess that appears multiloculated. The perinephric abscess has some improvement in size.    We reviewed the ct scan from December and 09/24/20... they look basically the same. There are 2 large abscesses -- one just above the left pelvis and one right below the left kidney.   Patient will need to be back on antibiotics given the abscess size. She'll also need to have discussion with urology/surgery. I worry she might need surgery for abscess clean out. The other more conservative  route is another drain into the abscess due to loculation and see if more can be drained  She does mention that after 2/16 manual drainage of the abscess (drain #2 -- pararenal abscess) has been at least 1000 plus cc. So maybe repeat ct will see much improvement   10/27/20 assessment Resumed cefazolin  2/21 Pending nephrectomy with Dr Alyson Ingles tomorrow Would have ID team see her inpatient and make decision on antibiotics based on imaging/operative finding  4/01 assessment S/p left kidney resection 3/22, path consistent with Xanthogranulomatous pyelo. Op note doesn't indicate I&D of the previous abscess. Her picc/iv abx stopped and all iv drains removed. She is on bactrim now. Previous cx was ecoli  Will need to find out what the abscesses are doing now since kidney removed   12/17/20 assessment Repeat ct abd pelv 4/12 showed increased left renal fossa fluid collection size compared to 3/16 (3cm --> 5 cm) Clinically no sign of infection   Discussed different approach and agree on watch/wait for abx  -f/u 4-8 weeks -stay off antibiotics for now -return sooner if sign of infection (discussed in avs)  I spent more than 20 minute reviewing data/chart, and coordinating care and >50% direct face to face time providing counseling/discussing diagnostics/treatment plan with patient  Tiney Rouge for [facility] 12/17/2020, 2:16 PM

## 2020-12-17 NOTE — Patient Instructions (Signed)
You are doing very well unlike someone having an active infection  You do have a fluid collection around the site where they removed the kidney (and previously where the infection was)   In this setting, let's watch for sign and symptoms of recurrent infection (n/v/fatigue poor appetite, fever chill, nightsweat) before we decide to put you on antibiotics again. There is a possibility that if this happy will need to get some sampling to culture and check for antibiotics resistance   Labs to do today (baseline inflammatory markers)   Follow up with me in 4-8 weeks

## 2020-12-18 LAB — CBC WITH DIFFERENTIAL/PLATELET
Absolute Monocytes: 394 cells/uL (ref 200–950)
Basophils Absolute: 29 cells/uL (ref 0–200)
Basophils Relative: 0.6 %
Eosinophils Absolute: 91 cells/uL (ref 15–500)
Eosinophils Relative: 1.9 %
HCT: 40.9 % (ref 35.0–45.0)
Hemoglobin: 13.1 g/dL (ref 11.7–15.5)
Lymphs Abs: 1392 cells/uL (ref 850–3900)
MCH: 26.1 pg — ABNORMAL LOW (ref 27.0–33.0)
MCHC: 32 g/dL (ref 32.0–36.0)
MCV: 81.5 fL (ref 80.0–100.0)
MPV: 9.8 fL (ref 7.5–12.5)
Monocytes Relative: 8.2 %
Neutro Abs: 2894 cells/uL (ref 1500–7800)
Neutrophils Relative %: 60.3 %
Platelets: 406 10*3/uL — ABNORMAL HIGH (ref 140–400)
RBC: 5.02 10*6/uL (ref 3.80–5.10)
RDW: 20.6 % — ABNORMAL HIGH (ref 11.0–15.0)
Total Lymphocyte: 29 %
WBC: 4.8 10*3/uL (ref 3.8–10.8)

## 2020-12-18 LAB — COMPREHENSIVE METABOLIC PANEL
AG Ratio: 1.7 (calc) (ref 1.0–2.5)
ALT: 18 U/L (ref 6–29)
AST: 16 U/L (ref 10–35)
Albumin: 4.3 g/dL (ref 3.6–5.1)
Alkaline phosphatase (APISO): 108 U/L (ref 37–153)
BUN: 22 mg/dL (ref 7–25)
CO2: 25 mmol/L (ref 20–32)
Calcium: 10.2 mg/dL (ref 8.6–10.4)
Chloride: 103 mmol/L (ref 98–110)
Creat: 0.96 mg/dL (ref 0.50–1.05)
Globulin: 2.6 g/dL (calc) (ref 1.9–3.7)
Glucose, Bld: 131 mg/dL — ABNORMAL HIGH (ref 65–99)
Potassium: 4 mmol/L (ref 3.5–5.3)
Sodium: 140 mmol/L (ref 135–146)
Total Bilirubin: 0.4 mg/dL (ref 0.2–1.2)
Total Protein: 6.9 g/dL (ref 6.1–8.1)

## 2020-12-18 LAB — C-REACTIVE PROTEIN: CRP: 1.1 mg/L (ref <8.0)

## 2021-01-06 ENCOUNTER — Other Ambulatory Visit: Payer: Self-pay

## 2021-01-06 ENCOUNTER — Telehealth (INDEPENDENT_AMBULATORY_CARE_PROVIDER_SITE_OTHER): Payer: BC Managed Care – PPO | Admitting: Urology

## 2021-01-06 ENCOUNTER — Encounter: Payer: Self-pay | Admitting: Urology

## 2021-01-06 DIAGNOSIS — N151 Renal and perinephric abscess: Secondary | ICD-10-CM

## 2021-01-06 NOTE — Patient Instructions (Signed)
Skin Abscess  A skin abscess is an infected area on or under your skin that contains a collection of pus and other material. An abscess may also be called a furuncle, carbuncle, or boil. An abscess can occur in or on almost any part of your body. Some abscesses break open (rupture) on their own. Most continue to get worse unless they are treated. The infection can spread deeper into the body and eventually into your blood, which can make you feel ill. Treatment usually involves draining the abscess. What are the causes? An abscess occurs when germs, like bacteria, pass through your skin and cause an infection. This may be caused by:  A scrape or cut on your skin.  A puncture wound through your skin, including a needle injection or insect bite.  Blocked oil or sweat glands.  Blocked and infected hair follicles.  A cyst that forms beneath your skin (sebaceous cyst) and becomes infected. What increases the risk? This condition is more likely to develop in people who:  Have a weak body defense system (immune system).  Have diabetes.  Have dry and irritated skin.  Get frequent injections or use illegal IV drugs.  Have a foreign body in a wound, such as a splinter.  Have problems with their lymph system or veins. What are the signs or symptoms? Symptoms of this condition include:  A painful, firm bump under the skin.  A bump with pus at the top. This may break through the skin and drain. Other symptoms include:  Redness surrounding the abscess site.  Warmth.  Swelling of the lymph nodes (glands) near the abscess.  Tenderness.  A sore on the skin. How is this diagnosed? This condition may be diagnosed based on:  A physical exam.  Your medical history.  A sample of pus. This may be used to find out what is causing the infection.  Blood tests.  Imaging tests, such as an ultrasound, CT scan, or MRI. How is this treated? A small abscess that drains on its own may not  need treatment. Treatment for larger abscesses may include:  Moist heat or heat pack applied to the area several times a day.  A procedure to drain the abscess (incision and drainage).  Antibiotic medicines. For a severe abscess, you may first get antibiotics through an IV and then change to antibiotics by mouth. Follow these instructions at home: Medicines  Take over-the-counter and prescription medicines only as told by your health care provider.  If you were prescribed an antibiotic medicine, take it as told by your health care provider. Do not stop taking the antibiotic even if you start to feel better.   Abscess care  If you have an abscess that has not drained, apply heat to the affected area. Use the heat source that your health care provider recommends, such as a moist heat pack or a heating pad. ? Place a towel between your skin and the heat source. ? Leave the heat on for 20-30 minutes. ? Remove the heat if your skin turns bright red. This is especially important if you are unable to feel pain, heat, or cold. You may have a greater risk of getting burned.  Follow instructions from your health care provider about how to take care of your abscess. Make sure you: ? Cover the abscess with a bandage (dressing). ? Change your dressing or gauze as told by your health care provider. ? Wash your hands with soap and water before you change the   dressing or gauze. If soap and water are not available, use hand sanitizer.  Check your abscess every day for signs of a worsening infection. Check for: ? More redness, swelling, or pain. ? More fluid or blood. ? Warmth. ? More pus or a bad smell.   General instructions  To avoid spreading the infection: ? Do not share personal care items, towels, or hot tubs with others. ? Avoid making skin contact with other people.  Keep all follow-up visits as told by your health care provider. This is important. Contact a health care provider if you  have:  More redness, swelling, or pain around your abscess.  More fluid or blood coming from your abscess.  Warm skin around your abscess.  More pus or a bad smell coming from your abscess.  A fever.  Muscle aches.  Chills or a general ill feeling. Get help right away if you:  Have severe pain.  See red streaks on your skin spreading away from the abscess. Summary  A skin abscess is an infected area on or under your skin that contains a collection of pus and other material.  A small abscess that drains on its own may not need treatment.  Treatment for larger abscesses may include having a procedure to drain the abscess and taking an antibiotic. This information is not intended to replace advice given to you by your health care provider. Make sure you discuss any questions you have with your health care provider. Document Revised: 11/16/2018 Document Reviewed: 09/08/2017 Elsevier Patient Education  2021 [employer] Inc[employer]  

## 2021-01-06 NOTE — Progress Notes (Signed)
01/06/2021 2:41 PM   Tammie [name] 1961/04/23 [id number]  Referring provider: Joya [name] [address] [place],  [place] [address]   Patient location: home Physician location: office I connected with  Tammie [name] on 01/06/21 by a video enabled telemedicine application and verified that I am speaking with the correct person using two identifiers.   I discussed the limitations of evaluation and management by telemedicine. The patient expressed understanding and agreed to proceed.   followup nephrectomy  HPI: Tammie [name] is a 59yo here for followup for left perinephric abscess. She is having more left abdominal pain since she last saw me 4/8. No fevers. No erythema. No issues urinating.    PMH: Past Medical History:  Diagnosis Date  . Acute pyelonephritis   . Arthritis   . Class 1 obesity   . Diabetes mellitus without complication (HCC)   . History of drainage of abscess   . Hypertension   . Lung nodules   . Multifocal pneumonia 07/31/2020  . Perirenal abscess   . Pleural effusion     Surgical History: Past Surgical History:  Procedure Laterality Date  . IR CATHETER TUBE CHANGE  09/04/2020  . IR CATHETER TUBE CHANGE  09/04/2020  . IR RADIOLOGIST EVAL & MGMT  09/24/2020  . IR RADIOLOGIST EVAL & MGMT  10/08/2020  . IR RADIOLOGIST EVAL & MGMT  10/22/2020  . NEPHRECTOMY Left 10/28/2020   Procedure: LEFT OPEN NEPHRECTOMY- SIMPLE;  Surgeon: Malen Gauze, MD;  Location: [facility];  Service: Urology;  Laterality: Left;  . TUBAL LIGATION      Home Medications:  Allergies as of 01/06/2021      Reactions   Codeine Rash      Medication List       Accurate as of Jan 06, 2021  2:41 PM. If you have any questions, ask your nurse or doctor.        acetaminophen 500 MG tablet Commonly known as: TYLENOL Take 1,000 mg by mouth every 6 (six) hours as needed for mild pain.   blood glucose meter kit and supplies Kit Dispense based on patient and insurance preference. Use  up to four times daily as directed. (FOR ICD-9 250.00, 250.01).   CINNAMON PO Take 1 tablet by mouth daily.   Farxiga 10 MG Tabs tablet Generic drug: dapagliflozin propanediol Take by mouth daily.   ferrous sulfate 325 (65 FE) MG tablet Take 1 tablet (325 mg total) by mouth daily with breakfast.   HumaLOG KwikPen 100 UNIT/ML KwikPen Generic drug: insulin lispro Inject 0-5 Units into the skin 3 (three) times daily as needed (high blood sugar). Per sliding scale   Lantus SoloStar 100 UNIT/ML Solostar Pen Generic drug: insulin glargine Inject 8 Units into the skin 2 (two) times daily.   levofloxacin 750 MG tablet Commonly known as: LEVAQUIN Take 1 tablet (750 mg total) by mouth daily.   metFORMIN 500 MG 24 hr tablet Commonly known as: GLUCOPHAGE-XR Take by mouth.   NovoLIN R FlexPen ReliOn 100 UNIT/ML Sopn Generic drug: Insulin Regular Human Sugar 121-150--2 units; 151-200 3 units; 201-250 5 units; 251-300--8 units; 301-350--11 units; 351-400--15 units   ondansetron 4 MG tablet Commonly known as: Zofran Take 1 tablet (4 mg total) by mouth daily as needed for nausea or vomiting.   oxyCODONE-acetaminophen 5-325 MG tablet Commonly known as: Percocet Take 1 tablet by mouth every 4 (four) hours as needed.   Pen Needles 3/16" 31G X 5 MM Misc 1 each  by Does not apply route 3 (three) times daily.   sulfamethoxazole-trimethoprim 800-160 MG tablet Commonly known as: BACTRIM DS Take 1 tablet by mouth 2 (two) times daily.       Allergies:  Allergies  Allergen Reactions  . Codeine Rash    Family History: Family History  Problem Relation Age of Onset  . Huntington's disease Father   . Diabetes Sister     Social History:  reports that she has never smoked. She has never used smokeless tobacco. She reports previous alcohol use. She reports previous drug use.  ROS: All other review of systems were reviewed and are negative except what is noted above in HPI  Physical  Exam: LMP  (LMP Unknown)   Constitutional:  Alert and oriented, No acute distress. HEENT: [place] AT, moist mucus membranes.  Trachea midline, no masses. Cardiovascular: No clubbing, cyanosis, or edema. Respiratory: Normal respiratory effort, no increased work of breathing. GI: Abdomen is soft, nontender, nondistended, no abdominal masses GU: No CVA tenderness.  Lymph: No cervical or inguinal lymphadenopathy. Skin: No rashes, bruises or suspicious lesions. Neurologic: Grossly intact, no focal deficits, moving all 4 extremities. Psychiatric: Normal mood and affect.  Laboratory Data: Lab Results  Component Value Date   WBC 4.8 12/17/2020   HGB 13.1 12/17/2020   HCT 40.9 12/17/2020   MCV 81.5 12/17/2020   PLT 406 (H) 12/17/2020    Lab Results  Component Value Date   CREATININE 0.96 12/17/2020    No results found for: PSA  No results found for: TESTOSTERONE  Lab Results  Component Value Date   HGBA1C 6.0 (H) 10/28/2020    Urinalysis    Component Value Date/Time   COLORURINE AMBER (A) 09/01/2020 0005   APPEARANCEUR Clear 11/14/2020 1052   LABSPEC 1.006 09/01/2020 0005   PHURINE 6.0 09/01/2020 0005   GLUCOSEU Negative 11/14/2020 1052   HGBUR MODERATE (A) 09/01/2020 0005   BILIRUBINUR Negative 11/14/2020 1052   KETONESUR NEGATIVE 09/01/2020 0005   PROTEINUR Negative 11/14/2020 1052   PROTEINUR 100 (A) 09/01/2020 0005   NITRITE Negative 11/14/2020 1052   NITRITE NEGATIVE 09/01/2020 0005   LEUKOCYTESUR Trace (A) 11/14/2020 1052   LEUKOCYTESUR MODERATE (A) 09/01/2020 0005    Lab Results  Component Value Date   LABMICR See below: 11/14/2020   WBCUA 6-10 (A) 11/14/2020   LABEPIT 0-10 11/14/2020   MUCUS Present 10/24/2020   BACTERIA Few (A) 11/14/2020    Pertinent Imaging:  No results found for this or any previous visit.  No results found for this or any previous visit.  No results found for this or any previous visit.  No results found for this or any previous  visit.  No results found for this or any previous visit.  No results found for this or any previous visit.  No results found for this or any previous visit.  Results for orders placed during the hospital encounter of 07/31/20  CT RENAL STONE STUDY  Narrative CLINICAL DATA:  60 year old female with pyelonephritis and renal abscess. Follow-up CT.  EXAM: CT ABDOMEN AND PELVIS WITHOUT CONTRAST  TECHNIQUE: Multidetector CT imaging of the abdomen and pelvis was performed following the standard protocol without IV contrast.  COMPARISON:  CT abdomen pelvis dated 08/13/2020.  FINDINGS: Evaluation of this exam is limited in the absence of intravenous contrast.  Lower chest: Partially visualized small bilateral pleural effusions and consolidative changes of the lung bases as seen previously. Several pulmonary nodules measure 7 mm in the right middle lobe.  No intra-abdominal free air or free fluid.  Hepatobiliary: No focal liver abnormality is seen. No gallstones, gallbladder wall thickening, or biliary dilatation.  Pancreas: Unremarkable. No pancreatic ductal dilatation or surrounding inflammatory changes.  Spleen: Normal in size without focal abnormality.  Adrenals/Urinary Tract: The adrenal glands unremarkable. Left percutaneous nephrostomy with pigtail tip in the left renal pelvis in similar position. There has been interval placement a percutaneous drainage catheter with pigtail tip in the posterior perinephric collection. Slight interval decrease in the size of the perinephric collection compared to prior CT. There is no hydronephrosis or nephrolithiasis on either side. The right kidney is unremarkable. The urinary bladder is decompressed around a Foley catheter.  Stomach/Bowel: There is no bowel obstruction or active inflammation. The appendix is not visualized with certainty. No inflammatory changes identified in the right lower quadrant.  Vascular/Lymphatic:  Mild aortoiliac atherosclerotic disease. The IVC is unremarkable. No portal venous gas. There is no adenopathy.  Reproductive: The uterus and ovaries are grossly unremarkable.  Other: Mild diffuse subcutaneous edema. There has been interval placement of a percutaneous drainage catheter with tip in the fluid collection along the anterior aspect of the left psoas muscle. The collection has decreased in size now measuring approximately 4.2 x 8.0 cm (previously 4.4 x 8.7 cm).  Musculoskeletal: No acute or significant osseous findings.  IMPRESSION: 1. Interval placement of a percutaneous drainage catheter with tip in the posterior perinephric collection. Slight interval decrease in the size of the fluid collection compared to prior CT. 2. Decrease in the size of the collection anterior to the left psoas muscle status post percutaneous drainage catheter placement. Near 3. No hydronephrosis or nephrolithiasis. 4. No bowel obstruction. 5. Several pulmonary nodules measure 7 mm in the right middle lobe. 6. Aortic Atherosclerosis (ICD10-I70.0).   Electronically Signed By: Anner Crete M.D. On: 08/16/2020 20:27   Assessment & Plan:    1. Perinephric abscess -STAT stone study, will call with results   No follow-ups on file.  Nicolette Bang, MD  [facility] [place]

## 2021-01-07 ENCOUNTER — Ambulatory Visit (HOSPITAL_COMMUNITY)
Admission: RE | Admit: 2021-01-07 | Discharge: 2021-01-07 | Disposition: A | Discharge status: Home / Self Care | Payer: BC Managed Care – PPO | Source: Ambulatory Visit | Attending: Urology | Admitting: Urology

## 2021-01-07 DIAGNOSIS — Z87448 Personal history of other diseases of urinary system: Secondary | ICD-10-CM | POA: Diagnosis not present

## 2021-01-07 DIAGNOSIS — Z9889 Other specified postprocedural states: Secondary | ICD-10-CM | POA: Diagnosis not present

## 2021-01-07 DIAGNOSIS — R109 Unspecified abdominal pain: Secondary | ICD-10-CM | POA: Diagnosis not present

## 2021-01-07 DIAGNOSIS — I700 Atherosclerosis of aorta: Secondary | ICD-10-CM | POA: Diagnosis not present

## 2021-01-07 DIAGNOSIS — N151 Renal and perinephric abscess: Secondary | ICD-10-CM | POA: Diagnosis not present

## 2021-01-14 ENCOUNTER — Ambulatory Visit: Payer: BC Managed Care – PPO | Admitting: Internal Medicine

## 2021-01-28 ENCOUNTER — Telehealth: Payer: Self-pay

## 2021-01-28 NOTE — Telephone Encounter (Signed)
Patient called and notified of results.  °

## 2021-01-28 NOTE — Telephone Encounter (Signed)
-----   Message from Malen Gauze, MD sent at 01/26/2021  7:39 AM EDT ----- Ct shows improvement in the fluid collection ----- Message ----- From: Ferdinand Lango, RN Sent: 01/07/2021   9:36 AM EDT To: Malen Gauze, MD  Please review

## 2021-10-27 ENCOUNTER — Ambulatory Visit: Payer: BC Managed Care – PPO | Admitting: Urology

## 2022-01-31 IMAGING — CT CT ABD-PEL WO/W CM
2 of 9 series · 12 of 46 positions shown, 18 images · IV contrast (omnipaque)
Comparison: 08/08/2020

CLINICAL DATA: Complicated pyelonephritis.  Abscess.

EXAM:
CT ABDOMEN AND PELVIS WITHOUT AND WITH CONTRAST
TECHNIQUE: Multidetector CT imaging of the abdomen and pelvis was performed
following the standard protocol before and following the bolus
administration of intravenous contrast.
CONTRAST:  100mL OMNIPAQUE IOHEXOL 300 MG/ML  SOLN

[Series 2: axial st · axial · 0.80mm/px · z∈[+1091,+1451]mm · 9 of 91 slices shown, 15 images]
[im 10/91  soft-tissue]
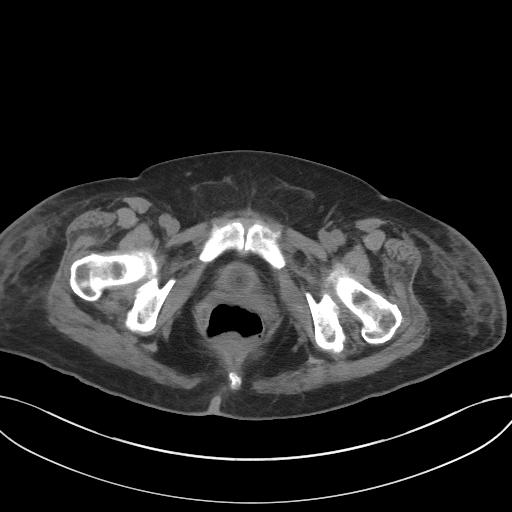
[im 10/91  bone]
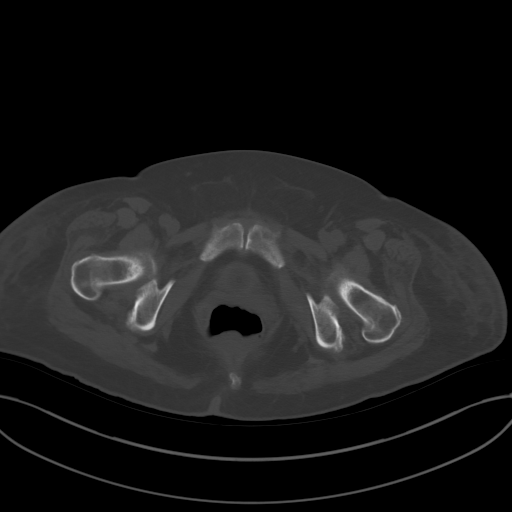
[im 19/91  soft-tissue]
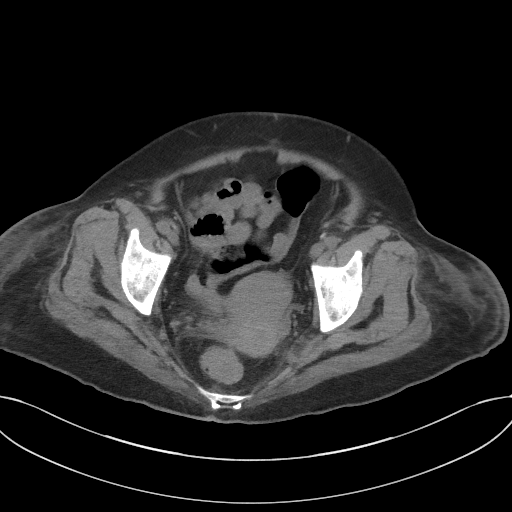
[im 28/91  soft-tissue]
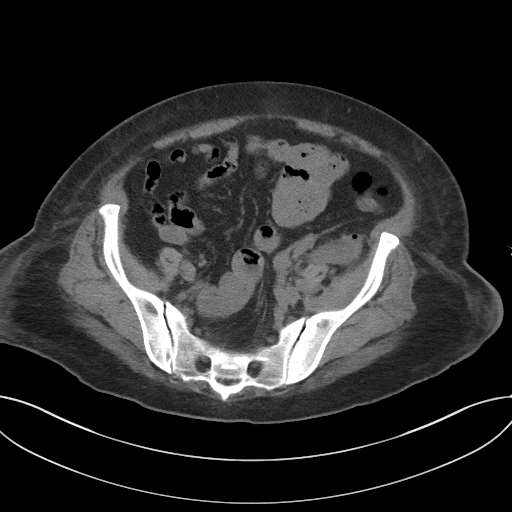
[im 37/91  soft-tissue]
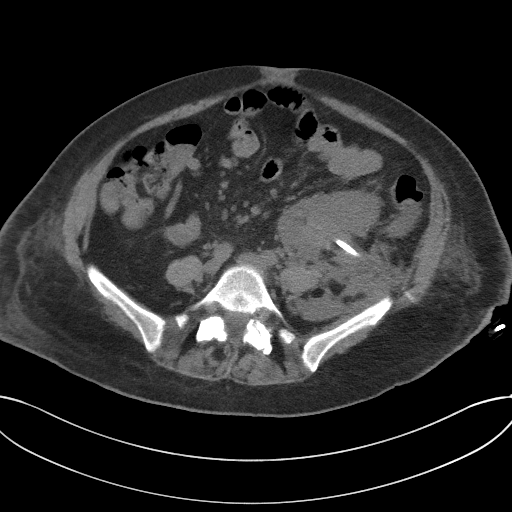
[im 46/91  soft-tissue]
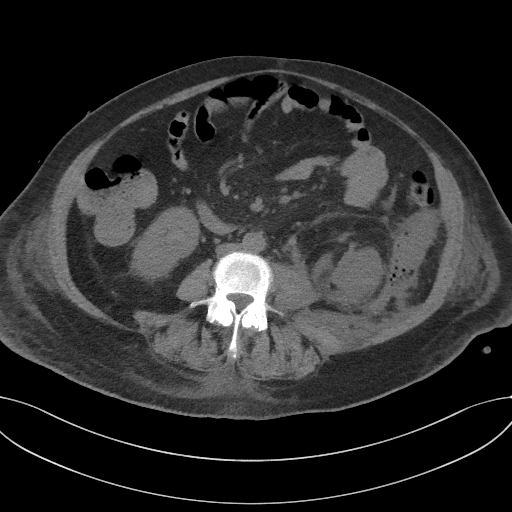
[im 55/91  soft-tissue]
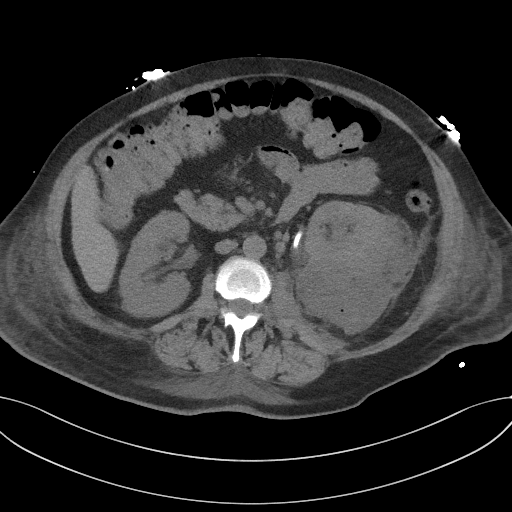
[im 55/91  lung]
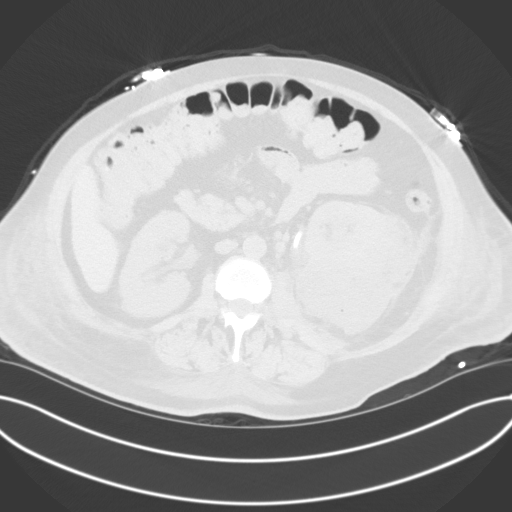
[im 64/91  soft-tissue]
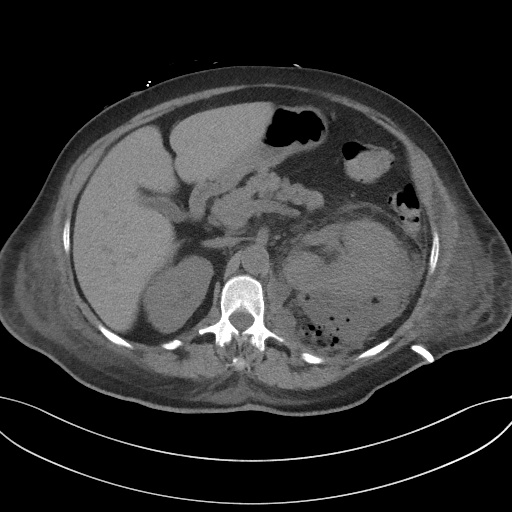
[im 64/91  lung]
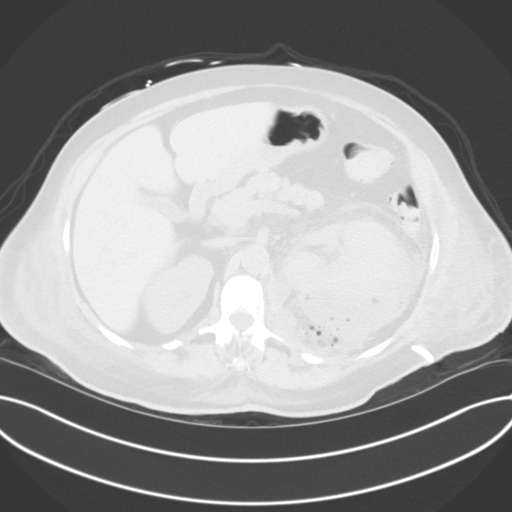
[im 73/91  soft-tissue]
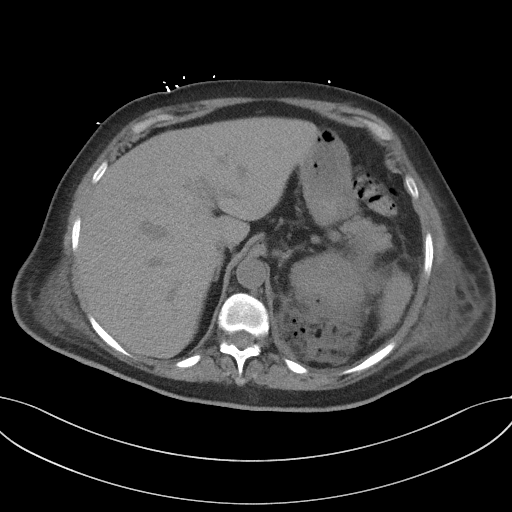
[im 73/91  lung]
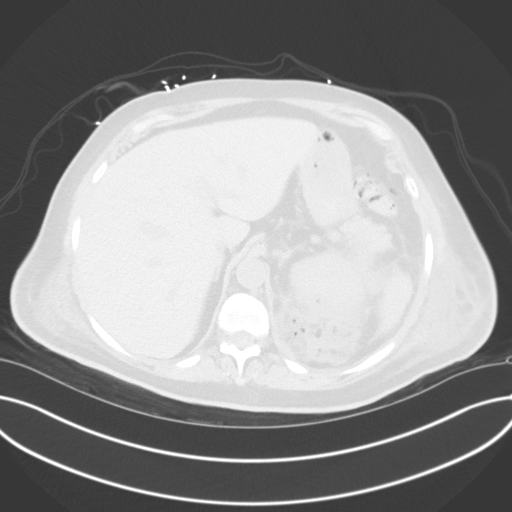
[im 82/91  soft-tissue]
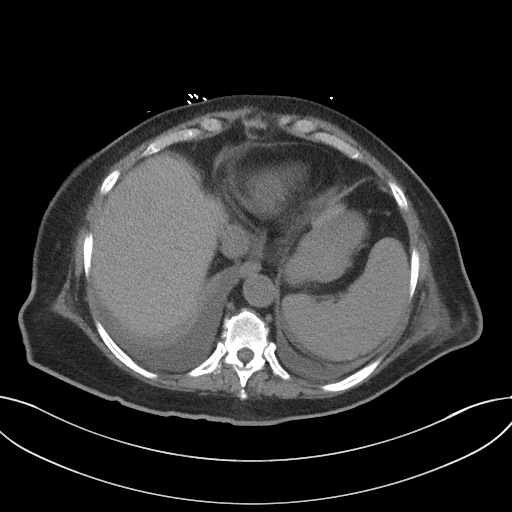
[im 82/91  lung]
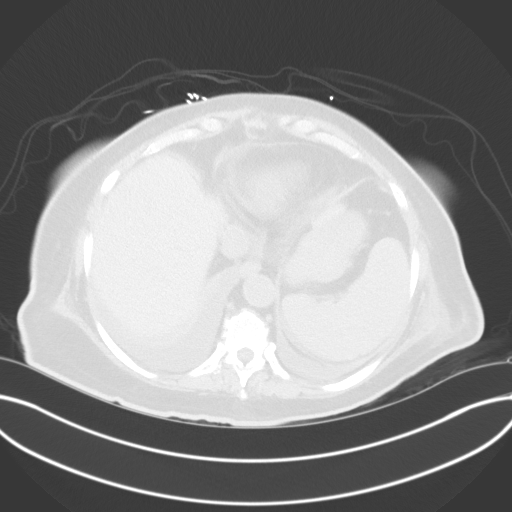
[im 82/91  bone]
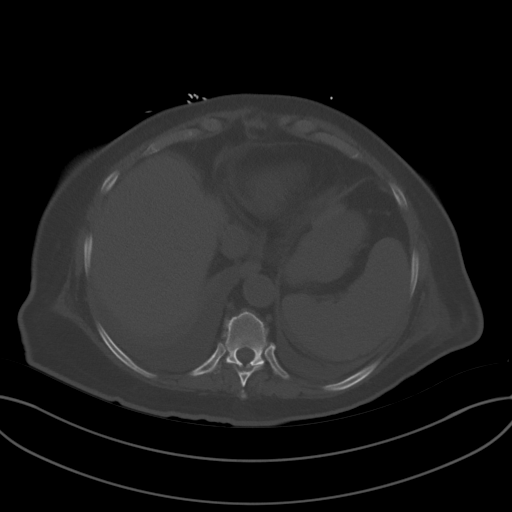

[Series 5: coronal st · coronal · 0.82mm/px · 3 of 97 slices shown]
[im 25/97  soft-tissue]
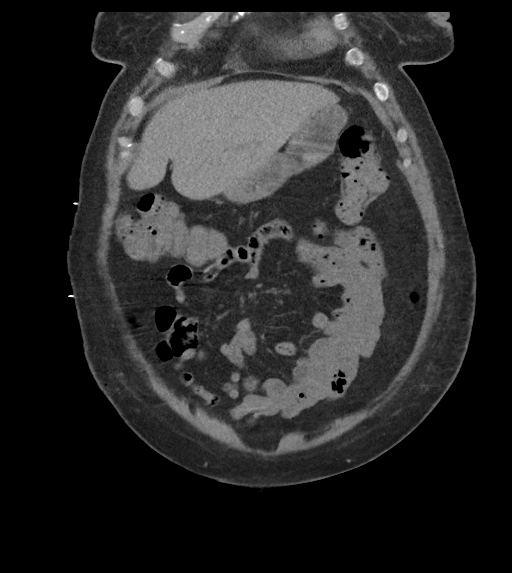
[im 49/97  soft-tissue]
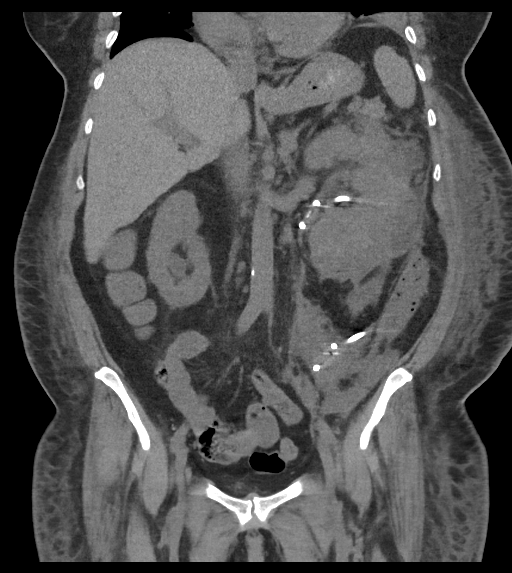
[im 73/97  soft-tissue]
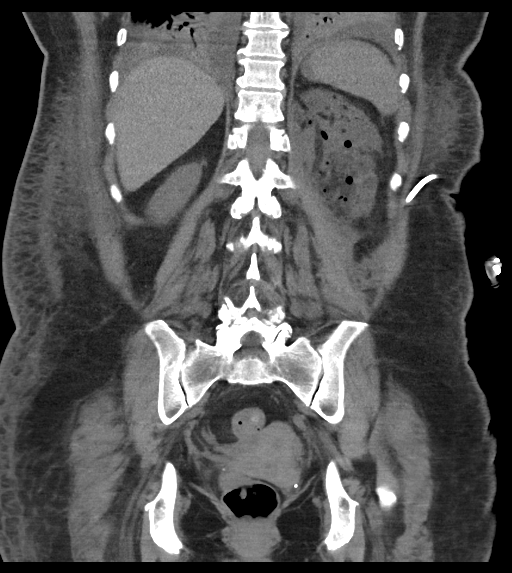

[12 of 46 positions shown; findings below may reference images not displayed]

FINDINGS: Lower Chest: Small bilateral pleural effusions are again seen with
atelectasis or consolidation in the posterior lower lobes. Multiple
small pulmonary nodules are again seen in the visualized portion of
the right lower lung, without significant change.

Hepatobiliary: No hepatic masses identified. Gallbladder is
unremarkable. No evidence of biliary ductal dilatation.

Pancreas:  No mass or inflammatory changes.

Spleen: Within normal limits in size and appearance.

Adrenals/Urinary Tract: A left percutaneous nephrostomy tube is seen
in appropriate position and there is no evidence of hydronephrosis.
Diffuse left renal swelling is again seen with multiple small
irregular fluid density collections throughout the renal parenchyma,
and a perinephric fluid and gas collection is again seen along the
lateral and posterior margins of the kidney. This measures 9.4 x
cm on image [DATE], increased from 7.4 x 2.5 cm when remeasured in
same planes on prior exam.

A 2nd percutaneous drainage catheter is seen in the left lower
quadrant along the anterior aspect of the left psoas muscle. Left
retroperitoneal fluid collection in this region measures 8.7 x
cm on image 54/2, without significant change. A 3rd fluid and gas
collection is seen in the left pararenal space which measures 6.7 x
2.7 cm on image 46/7, without significant change in size since prior
exam. Foley catheter is noted within the urinary bladder.

Stomach/Bowel: No evidence of obstruction, inflammatory process or
abnormal fluid collections.

Vascular/Lymphatic: No pathologically enlarged lymph nodes. No
abdominal aortic aneurysm.

Reproductive:  No mass or other significant abnormality.

Other:  None.

Musculoskeletal:  No suspicious bone lesions identified.
IMPRESSION: Left percutaneous nephrostomy tube in appropriate position. No
evidence of hydronephrosis.

Stable diffuse left renal parenchymal swelling with multiple small
internal fluid density collections.

Mild increase in size of left perinephric fluid and gas collection.

No significant change in size of fluid and gas collections along the
anterior aspect of left psoas muscle, and in the left lateral
pararenal space.

Stable small bilateral pleural effusions and posterior lower lobe
atelectasis or consolidation.

Stable small right lower lung pulmonary nodules.
